# Patient Record
Sex: Female | Born: 1980
Health system: Southern US, Community
[De-identification: ages and names within clinical notes are randomized; demographics above are authoritative.]

## PROBLEM LIST (undated history)

## (undated) ENCOUNTER — Inpatient Hospital Stay (HOSPITAL_COMMUNITY): Payer: Self-pay

## (undated) DIAGNOSIS — I10 Essential (primary) hypertension: Secondary | ICD-10-CM

## (undated) DIAGNOSIS — G629 Polyneuropathy, unspecified: Secondary | ICD-10-CM

## (undated) DIAGNOSIS — R51 Headache: Secondary | ICD-10-CM

## (undated) DIAGNOSIS — N920 Excessive and frequent menstruation with regular cycle: Secondary | ICD-10-CM

## (undated) DIAGNOSIS — Z98891 History of uterine scar from previous surgery: Secondary | ICD-10-CM

## (undated) DIAGNOSIS — N390 Urinary tract infection, site not specified: Secondary | ICD-10-CM

## (undated) DIAGNOSIS — G5603 Carpal tunnel syndrome, bilateral upper limbs: Secondary | ICD-10-CM

## (undated) DIAGNOSIS — F419 Anxiety disorder, unspecified: Secondary | ICD-10-CM

## (undated) DIAGNOSIS — G43909 Migraine, unspecified, not intractable, without status migrainosus: Secondary | ICD-10-CM

## (undated) DIAGNOSIS — A749 Chlamydial infection, unspecified: Secondary | ICD-10-CM

## (undated) DIAGNOSIS — R569 Unspecified convulsions: Secondary | ICD-10-CM

## (undated) DIAGNOSIS — I2699 Other pulmonary embolism without acute cor pulmonale: Secondary | ICD-10-CM

## (undated) DIAGNOSIS — O139 Gestational [pregnancy-induced] hypertension without significant proteinuria, unspecified trimester: Secondary | ICD-10-CM

## (undated) DIAGNOSIS — B999 Unspecified infectious disease: Secondary | ICD-10-CM

## (undated) DIAGNOSIS — M1990 Unspecified osteoarthritis, unspecified site: Secondary | ICD-10-CM

## (undated) DIAGNOSIS — D649 Anemia, unspecified: Secondary | ICD-10-CM

## (undated) DIAGNOSIS — I119 Hypertensive heart disease without heart failure: Secondary | ICD-10-CM

## (undated) DIAGNOSIS — I517 Cardiomegaly: Secondary | ICD-10-CM

## (undated) DIAGNOSIS — G40309 Generalized idiopathic epilepsy and epileptic syndromes, not intractable, without status epilepticus: Secondary | ICD-10-CM

## (undated) HISTORY — DX: Urinary tract infection, site not specified: N39.0

## (undated) HISTORY — DX: Excessive and frequent menstruation with regular cycle: N92.0

## (undated) HISTORY — PX: MOUTH SURGERY: SHX715

## (undated) HISTORY — DX: Migraine, unspecified, not intractable, without status migrainosus: G43.909

## (undated) HISTORY — DX: Generalized idiopathic epilepsy and epileptic syndromes, not intractable, without status epilepticus: G40.309

## (undated) HISTORY — DX: Hypertensive heart disease without heart failure: I11.9

## (undated) HISTORY — PX: ABDOMINAL HYSTERECTOMY: SHX81

## (undated) HISTORY — DX: Cardiomegaly: I51.7

## (undated) HISTORY — DX: Unspecified osteoarthritis, unspecified site: M19.90

## (undated) HISTORY — DX: Other pulmonary embolism without acute cor pulmonale: I26.99

---

## 2002-02-23 ENCOUNTER — Emergency Department (HOSPITAL_COMMUNITY): Admission: EM | Admit: 2002-02-23 | Discharge: 2002-02-24 | Payer: Self-pay | Admitting: Emergency Medicine

## 2002-04-06 ENCOUNTER — Inpatient Hospital Stay (HOSPITAL_COMMUNITY): Admission: AD | Admit: 2002-04-06 | Discharge: 2002-04-09 | Payer: Self-pay | Admitting: *Deleted

## 2002-04-06 ENCOUNTER — Encounter: Payer: Self-pay | Admitting: *Deleted

## 2002-04-15 ENCOUNTER — Encounter: Admission: RE | Admit: 2002-04-15 | Discharge: 2002-04-15 | Payer: Self-pay | Admitting: *Deleted

## 2002-04-21 ENCOUNTER — Inpatient Hospital Stay (HOSPITAL_COMMUNITY): Admission: AD | Admit: 2002-04-21 | Discharge: 2002-04-21 | Payer: Self-pay | Admitting: Family Medicine

## 2002-04-24 ENCOUNTER — Inpatient Hospital Stay (HOSPITAL_COMMUNITY): Admission: AD | Admit: 2002-04-24 | Discharge: 2002-04-24 | Payer: Self-pay | Admitting: Obstetrics and Gynecology

## 2002-05-06 ENCOUNTER — Encounter: Admission: RE | Admit: 2002-05-06 | Discharge: 2002-05-06 | Payer: Self-pay | Admitting: *Deleted

## 2002-05-13 ENCOUNTER — Encounter: Admission: RE | Admit: 2002-05-13 | Discharge: 2002-05-13 | Payer: Self-pay | Admitting: Obstetrics and Gynecology

## 2002-05-27 ENCOUNTER — Encounter: Admission: RE | Admit: 2002-05-27 | Discharge: 2002-05-27 | Payer: Self-pay | Admitting: *Deleted

## 2002-06-10 ENCOUNTER — Encounter: Admission: RE | Admit: 2002-06-10 | Discharge: 2002-06-10 | Payer: Self-pay | Admitting: *Deleted

## 2002-06-11 ENCOUNTER — Inpatient Hospital Stay (HOSPITAL_COMMUNITY): Admission: RE | Admit: 2002-06-11 | Discharge: 2002-06-11 | Payer: Self-pay | Admitting: Family Medicine

## 2002-06-11 ENCOUNTER — Encounter: Payer: Self-pay | Admitting: Family Medicine

## 2002-06-12 ENCOUNTER — Inpatient Hospital Stay (HOSPITAL_COMMUNITY): Admission: AD | Admit: 2002-06-12 | Discharge: 2002-06-15 | Payer: Self-pay | Admitting: *Deleted

## 2002-06-23 ENCOUNTER — Encounter: Admission: RE | Admit: 2002-06-23 | Discharge: 2002-06-23 | Payer: Self-pay | Admitting: *Deleted

## 2002-07-08 ENCOUNTER — Encounter: Admission: RE | Admit: 2002-07-08 | Discharge: 2002-07-08 | Payer: Self-pay | Admitting: *Deleted

## 2002-07-15 ENCOUNTER — Encounter: Admission: RE | Admit: 2002-07-15 | Discharge: 2002-07-15 | Payer: Self-pay | Admitting: *Deleted

## 2002-07-21 ENCOUNTER — Encounter: Admission: RE | Admit: 2002-07-21 | Discharge: 2002-07-21 | Payer: Self-pay | Admitting: *Deleted

## 2002-07-29 ENCOUNTER — Encounter (HOSPITAL_COMMUNITY): Admission: RE | Admit: 2002-07-29 | Discharge: 2002-08-02 | Payer: Self-pay | Admitting: *Deleted

## 2002-07-29 ENCOUNTER — Encounter: Admission: RE | Admit: 2002-07-29 | Discharge: 2002-07-29 | Payer: Self-pay | Admitting: *Deleted

## 2002-08-05 ENCOUNTER — Inpatient Hospital Stay (HOSPITAL_COMMUNITY): Admission: AD | Admit: 2002-08-05 | Discharge: 2002-08-09 | Payer: Self-pay | Admitting: *Deleted

## 2002-08-05 ENCOUNTER — Encounter: Admission: RE | Admit: 2002-08-05 | Discharge: 2002-08-05 | Payer: Self-pay | Admitting: *Deleted

## 2002-08-15 ENCOUNTER — Inpatient Hospital Stay (HOSPITAL_COMMUNITY): Admission: AD | Admit: 2002-08-15 | Discharge: 2002-08-15 | Payer: Self-pay | Admitting: Family Medicine

## 2005-06-06 ENCOUNTER — Inpatient Hospital Stay (HOSPITAL_COMMUNITY): Admission: AD | Admit: 2005-06-06 | Discharge: 2005-06-06 | Payer: Self-pay | Admitting: Obstetrics & Gynecology

## 2005-06-10 ENCOUNTER — Inpatient Hospital Stay (HOSPITAL_COMMUNITY): Admission: AD | Admit: 2005-06-10 | Discharge: 2005-06-10 | Payer: Self-pay | Admitting: Obstetrics and Gynecology

## 2005-06-14 ENCOUNTER — Inpatient Hospital Stay (HOSPITAL_COMMUNITY): Admission: AD | Admit: 2005-06-14 | Discharge: 2005-06-14 | Payer: Self-pay | Admitting: *Deleted

## 2005-06-25 ENCOUNTER — Ambulatory Visit: Payer: Self-pay | Admitting: *Deleted

## 2005-07-18 ENCOUNTER — Ambulatory Visit: Payer: Self-pay | Admitting: Family Medicine

## 2005-07-18 ENCOUNTER — Inpatient Hospital Stay (HOSPITAL_COMMUNITY): Admission: RE | Admit: 2005-07-18 | Discharge: 2005-07-18 | Payer: Self-pay | Admitting: *Deleted

## 2005-08-08 ENCOUNTER — Ambulatory Visit: Payer: Self-pay | Admitting: Family Medicine

## 2005-08-29 ENCOUNTER — Ambulatory Visit: Payer: Self-pay | Admitting: Gynecology

## 2005-09-19 ENCOUNTER — Ambulatory Visit (HOSPITAL_COMMUNITY): Admission: RE | Admit: 2005-09-19 | Discharge: 2005-09-19 | Payer: Self-pay | Admitting: *Deleted

## 2005-09-19 ENCOUNTER — Ambulatory Visit: Payer: Self-pay | Admitting: *Deleted

## 2005-09-26 ENCOUNTER — Ambulatory Visit: Payer: Self-pay | Admitting: *Deleted

## 2005-09-27 ENCOUNTER — Inpatient Hospital Stay (HOSPITAL_COMMUNITY): Admission: AD | Admit: 2005-09-27 | Discharge: 2005-09-28 | Payer: Self-pay | Admitting: Obstetrics and Gynecology

## 2005-09-27 ENCOUNTER — Ambulatory Visit: Payer: Self-pay | Admitting: Obstetrics and Gynecology

## 2005-10-03 ENCOUNTER — Ambulatory Visit: Payer: Self-pay | Admitting: Gynecology

## 2005-10-10 ENCOUNTER — Ambulatory Visit: Payer: Self-pay | Admitting: Gynecology

## 2005-10-31 ENCOUNTER — Ambulatory Visit: Payer: Self-pay | Admitting: Gynecology

## 2005-11-14 ENCOUNTER — Ambulatory Visit: Payer: Self-pay | Admitting: Family Medicine

## 2005-11-28 ENCOUNTER — Ambulatory Visit: Payer: Self-pay | Admitting: Family Medicine

## 2005-12-02 ENCOUNTER — Inpatient Hospital Stay (HOSPITAL_COMMUNITY): Admission: AD | Admit: 2005-12-02 | Discharge: 2005-12-02 | Payer: Self-pay | Admitting: Gynecology

## 2005-12-02 ENCOUNTER — Ambulatory Visit: Payer: Self-pay | Admitting: Family Medicine

## 2005-12-09 ENCOUNTER — Inpatient Hospital Stay (HOSPITAL_COMMUNITY): Admission: AD | Admit: 2005-12-09 | Discharge: 2005-12-09 | Payer: Self-pay | Admitting: Obstetrics & Gynecology

## 2005-12-09 ENCOUNTER — Ambulatory Visit: Payer: Self-pay | Admitting: Family Medicine

## 2005-12-12 ENCOUNTER — Ambulatory Visit: Payer: Self-pay | Admitting: *Deleted

## 2005-12-19 ENCOUNTER — Ambulatory Visit: Payer: Self-pay | Admitting: Family Medicine

## 2006-01-02 ENCOUNTER — Ambulatory Visit: Payer: Self-pay | Admitting: Gynecology

## 2006-01-09 ENCOUNTER — Ambulatory Visit: Payer: Self-pay | Admitting: Gynecology

## 2006-01-20 ENCOUNTER — Inpatient Hospital Stay (HOSPITAL_COMMUNITY): Admission: AD | Admit: 2006-01-20 | Discharge: 2006-01-21 | Payer: Self-pay | Admitting: Gynecology

## 2006-01-20 ENCOUNTER — Ambulatory Visit: Payer: Self-pay | Admitting: Gynecology

## 2006-01-30 ENCOUNTER — Ambulatory Visit: Payer: Self-pay | Admitting: Family Medicine

## 2006-02-01 ENCOUNTER — Inpatient Hospital Stay (HOSPITAL_COMMUNITY): Admission: AD | Admit: 2006-02-01 | Discharge: 2006-02-01 | Payer: Self-pay | Admitting: Family Medicine

## 2006-02-02 ENCOUNTER — Ambulatory Visit: Payer: Self-pay | Admitting: Certified Nurse Midwife

## 2006-02-02 ENCOUNTER — Inpatient Hospital Stay (HOSPITAL_COMMUNITY): Admission: AD | Admit: 2006-02-02 | Discharge: 2006-02-02 | Payer: Self-pay | Admitting: Obstetrics and Gynecology

## 2006-02-04 ENCOUNTER — Inpatient Hospital Stay (HOSPITAL_COMMUNITY): Admission: AD | Admit: 2006-02-04 | Discharge: 2006-02-05 | Payer: Self-pay | Admitting: Family Medicine

## 2006-02-04 ENCOUNTER — Ambulatory Visit: Payer: Self-pay | Admitting: *Deleted

## 2006-02-06 ENCOUNTER — Ambulatory Visit: Payer: Self-pay | Admitting: Gynecology

## 2006-02-09 ENCOUNTER — Ambulatory Visit: Payer: Self-pay | Admitting: Certified Nurse Midwife

## 2006-02-09 ENCOUNTER — Inpatient Hospital Stay (HOSPITAL_COMMUNITY): Admission: AD | Admit: 2006-02-09 | Discharge: 2006-02-09 | Payer: Self-pay | Admitting: Obstetrics & Gynecology

## 2006-02-10 ENCOUNTER — Ambulatory Visit: Payer: Self-pay | Admitting: Obstetrics & Gynecology

## 2006-02-10 ENCOUNTER — Ambulatory Visit (HOSPITAL_COMMUNITY): Admission: RE | Admit: 2006-02-10 | Discharge: 2006-02-10 | Payer: Self-pay | Admitting: Obstetrics & Gynecology

## 2006-02-11 ENCOUNTER — Inpatient Hospital Stay (HOSPITAL_COMMUNITY): Admission: AD | Admit: 2006-02-11 | Discharge: 2006-02-13 | Payer: Self-pay | Admitting: Gynecology

## 2006-02-11 ENCOUNTER — Ambulatory Visit: Payer: Self-pay | Admitting: Family Medicine

## 2006-08-15 ENCOUNTER — Emergency Department (HOSPITAL_COMMUNITY): Admission: EM | Admit: 2006-08-15 | Discharge: 2006-08-15 | Payer: Self-pay | Admitting: Emergency Medicine

## 2009-07-28 DIAGNOSIS — G40909 Epilepsy, unspecified, not intractable, without status epilepticus: Secondary | ICD-10-CM | POA: Insufficient documentation

## 2009-10-06 DIAGNOSIS — B009 Herpesviral infection, unspecified: Secondary | ICD-10-CM | POA: Insufficient documentation

## 2009-10-06 DIAGNOSIS — A6000 Herpesviral infection of urogenital system, unspecified: Secondary | ICD-10-CM | POA: Insufficient documentation

## 2009-10-09 DIAGNOSIS — D508 Other iron deficiency anemias: Secondary | ICD-10-CM | POA: Insufficient documentation

## 2010-03-08 ENCOUNTER — Emergency Department (HOSPITAL_COMMUNITY): Admission: EM | Admit: 2010-03-08 | Discharge: 2010-03-08 | Payer: Self-pay | Admitting: Emergency Medicine

## 2010-07-15 ENCOUNTER — Encounter: Payer: Self-pay | Admitting: *Deleted

## 2010-09-06 LAB — POCT I-STAT, CHEM 8
Creatinine, Ser: 0.8 mg/dL (ref 0.4–1.2)
Hemoglobin: 12.6 g/dL (ref 12.0–15.0)
Sodium: 141 mEq/L (ref 135–145)
TCO2: 24 mmol/L (ref 0–100)

## 2010-09-06 LAB — POCT CARDIAC MARKERS: Myoglobin, poc: 54.6 ng/mL (ref 12–200)

## 2010-09-06 LAB — D-DIMER, QUANTITATIVE: D-Dimer, Quant: <0.22 ug/mL-FEU (ref 0.00–0.48)

## 2010-09-24 DIAGNOSIS — S43499A Other sprain of unspecified shoulder joint, initial encounter: Secondary | ICD-10-CM | POA: Insufficient documentation

## 2010-09-24 DIAGNOSIS — J069 Acute upper respiratory infection, unspecified: Secondary | ICD-10-CM | POA: Insufficient documentation

## 2010-11-09 NOTE — Op Note (Signed)
NAME:  Mary Roberts, Mary Roberts                        ACCOUNT NO.:  [id number]   MEDICAL RECORD NO.:  [id number]                   PATIENT TYPE:  INP   LOCATION:  9176                                 FACILITY:  [facility]   PHYSICIAN:  Mary Sella. Orlene Erm, M.D.                 DATE OF BIRTH:  1981/01/11   DATE OF PROCEDURE:  08/06/2002  DATE OF DISCHARGE:                                 OPERATIVE REPORT   PREOPERATIVE DIAGNOSIS:  The patient is a 30 year old G1 with arrest of  dilatation.   POSTOPERATIVE DIAGNOSIS:  The patient is a 30 year old G1 with arrest of  dilatation.   PROCEDURE:  Primary low transverse cesarean section.   SURGEON:  Mary Sella. Orlene Erm, M.D.   ANESTHESIA:  Epidural.   COMPLICATIONS:  None.   ESTIMATED BLOOD LOSS:  1000 mL.   SPECIMENS:  Cord blood.   FINDINGS:  A viable female infant delivered at 5:50 a.m., with Apgars of 8 and  9, weighing 7 pounds 10 ounces.   DISPOSITION:  Recovery room stable.   INDICATION FOR PROCEDURE:  The patient is a 30 year old gravida 1 at 39  weeks 6 days, who progressed in labor to complete, complete, and 0 station.  She began pushing and over approximately 2-1/2 hours had no descent of the  fetal head past +1 station.  With fundal pressure and pushing, there was no  further descent of the fetus.  The decision was made to perform a primary  low transverse C-section secondary to arrest of descent.  The patient was  consented with the risk of bleeding, infection, and injury to the internal  organs, risk of transfusion of emergent hysterectomy.  The patient  understands the risks and wishes to proceed.   DESCRIPTION OF PROCEDURE:  The patient was taken to the operating room,  where her epidural anesthesia was bolused.  She was prepped and draped in  sterile fashion.  A Pfannenstiel incision was performed with a scalpel and  carried down to the underlying fascia.  The fascia was entered sharply and  dissected laterally with Mayo scissors.  The  fascia was separated from the  underlying rectus muscle utilizing sharp and blunt dissection.  The muscle  layers were separated and the underlying peritoneum was entered bluntly with  the surgeon's finger.  The peritoneum was dissected both superiorly and  inferiorly.  The bladder blade was placed, a bladder flap was created with  sharp and blunt dissection.  The uterus was scored with a scalpel and the  incision was extended in the midline to the uterine cavity.  The surgeon's  fingers were easily able to extend the incision laterally and superiorly.  The infant's head was delivered through the uterine incision and bulb-  suctioned on the operative field.  The infant's body was delivered and the  cord was clamped and cut.  The infant was handed off to the awaiting  neonatal resuscitation team.  The uterus was massaged and the placenta was  extracted.  The uterus was externalized and curetted with a dry lap sponge.  A running locking suture of 0 chromic was used to close the uterine  incision.  A second imbricating layer was used as well.  The uterus was  returned to the abdominal cavity and the paracolic gutters were emptied of  clot and debris.  The uterine incision was inspected and noted to be  hemostatic.  The fascia was then closed in a running suture of 0 Vicryl.  The subcutaneous tissues were irrigated with saline.  There was some oozing  from the fascial edge, and a single suture of 0 chromic was used to obtain  hemostasis.  There was some irregularity to the skin incision secondary to  the bunching of skin with the adhesive drape.  The decision was made to  excise approximately 1-2 mm of skin along the superior and inferior portions  of the incision to make the skin edges reapproximate more closely.  This was  done with a scalpel and Bovie.  The skin edges were then reapproximated with  the staples.  The patient tolerated the procedure well and returned to the  recovery room in  stable condition.  Sponge, instrument, and needle counts  were correct at the end of the procedure.                                               Mary Sella. Orlene Erm, M.D.    EMH/MEDQ  D:  08/06/2002  T:  08/06/2002  Job:  [id number]

## 2010-11-09 NOTE — Discharge Summary (Signed)
   NAME:  Mary Roberts, Mary Roberts                        ACCOUNT NO.:  [id number]   MEDICAL RECORD NO.:  [id number]                   PATIENT TYPE:  INP   LOCATION:  9146                                 [facility]ILITY:  [facility]   PHYSICIAN:  Nani Gasser, M.D.            DATE OF BIRTH:  05-28-1981   DATE OF ADMISSION:  08/05/2002  DATE OF DISCHARGE:  08/09/2002                                 DISCHARGE SUMMARY   DISCHARGE DIAGNOSES:  1. Term delivery.  2. Low transverse cesarean section.   DISCHARGE MEDICATIONS:  1. Percocet 5/325 mg one q.4h. p.r.n.  2. Ibuprofen 600 mg one p.o. q.6h. p.r.n.  3. Mylanta gas chewables over-the-counter as directed.   Wound care, diet, activity are per instruction booklet.   FOLLOWUP:  The patient is to follow up at [facility] in six weeks.   DISCHARGE DISPOSITION:  The patient is discharged home.   HOSPITAL COURSE:  The patient is a 30 year old G1, P0-0-0-0, at 39-5/7 weeks  by last menstrual period.  The patient initially was admitted after  complaining of blood in her urine x4.  She was also feeling some mild  contractions and fetal activity at that time.  Denied fever or back  discomfort, headaches, vision changes, shortness of breath, or abdominal  pain.  The patient was getting her care at [facility].  It was late  prenatal care, and the patient did have a history of pyelonephritis earlier  in her pregnancy.  The patient was also noted to have elevated blood  pressures of 154/84 to 145/90, and pregnancy induced hypertension labs were  drawn which were negative with an uric acid of 4.9, LDH of 148, AST 21, ALT  15, BUN 8, and creatinine 0.6.  She was also dilated 4 cm in active labor at  that point.  The patient started pushing with contractions and did position  changes.  Because of arrest of descent, the patient was taken to the  operating room for a low transverse cesarean section.  The patient had an  epidural, and had an infant boy with  Apgar's of 8 at one minute and 9 at  five minutes, weighing 7 pounds 10 ounces.  The patient was kept until  postoperative day #3.  The patient recovered without difficulties.  She is  currently bottle feeding, and does not want any form of birth control at  this time, and did not want to discuss options.                                               Nani Gasser, M.D.    CM/MEDQ  D:  08/09/2002  T:  08/09/2002  Job:  [id number]

## 2010-11-09 NOTE — Discharge Summary (Signed)
NAME:  Mary Roberts, Mary Roberts                        ACCOUNT NO.:  [id number]   MEDICAL RECORD NO.:  [id number]                   PATIENT TYPE:  INP   LOCATION:  9156                                 [facility]ILITY[facility]   PHYSICIAN:  Nilda Simmer, M.D.                  DATE OF BIRTH:  02/20/1981   DATE OF ADMISSION:  06/12/2002  DATE OF DISCHARGE:  06/15/2002                                 DISCHARGE SUMMARY   DATE OF BIRTH:  January 08, 1981.   PROCEDURE:  None.   CONSULTATIONS:  None.   DISCHARGE DIAGNOSES:  1. Intrauterine pregnancy at 9 and 4/7th weeks gestation.  2. Preterm labor.   DISCHARGE MEDICATIONS:  1. Terbutaline 2.5 mg one by mouth every four hours.  2. Iron sulfate 325 mg one by mouth each day as previously prescribed.  3. Prenatal vitamins one by mouth each day.   FOLLOW UP:  The patient has a follow-up appointment at the [facility]  [facility] on June 23, 2002 as previously scheduled.   HOSPITAL COURSE:  Mary Roberts is a 30 year old Gravida I, Para 0,  presenting originally at 31 and 1/7th weeks gestation, who is followed at  the [facility] secondary to late prenatal care, pyelonephritis this  pregnancy, who presented secondary to appreciation of a dynamic cervix on  ultrasound on June 11, 2002. The patient was sent to Maternity Admission  Unit for evaluation during preterm labor. Upon evaluation, the patient's  cervix was found to be 150% and high. The patient was found to be  contracting irregularly on Toco monitoring. Cultures were obtained initially  on evaluation. Urine culture was negative. Wet prep negative. Gonorrhea and  Chlamydia cultures negative. GBS culture negative. However, the patient was  fetal fibronectin positive. The patient was admitted and received 72 hours  of Unasyn therapy as well as two doses of beta Methasone therapy during  hospitalization. The patient was placed on strict bedrest and IV fluids. The  patient  was also placed on oral tocolytics of Terbutaline which stopped  further uterine contractions. On hospital day three, the patient's cervix  was evaluated and was found to be one long and high. The patient was  counseled extensively in regards to the importance of strict bedrest and  detecting uterine contractions. The patient was instructed to return to the  hospital if she were to start contracting. The patient expressed  understanding of this and was discharged on hospital day three. The patient  will follow-up in one week at the [facility].   LABORATORY DATA:  Wed prep negative for yeast, clue cells, or trichomonas.  Gonorrhea negative. Chlamydia negative. GBS negative. Fetal fibronectin  positive. Urine culture negative. Urinalysis negative. CBC revealed white  blood cells of 11, hemoglobin 10.3, hematocrit 29.8 and platelets of 204.  Ultrasound at 32 weeks revealed a normal AFI with cervix that was  dynamic,  ranging from 1.7 to 4.4 in length and finally appropriate growth of 50 to 75  percentile for [redacted] weeks gestation.   ACTIVITY:  Strict bedrest. Only up to eat and bathe. No sexual activity or  anything that may induce an organism.   WOUND CARE:  Not applicable.   DIET:  Regular.   SPECIAL INSTRUCTIONS:  The patient was instructed to contact the [facility] or present to Maternity Admissions at [facility] for  development of contractions, leaking of fluid, vaginal bleeding, or  decreased fetal movement. The patient was instructed by myself and by  nursing in regards to preterm labor precautions.                                               Nilda Simmer, M.D.    KS/MEDQ  D:  06/15/2002  T:  06/15/2002  Job:  [id number]   cc:   Conni Elliot, M.D.  [address]  [place]  [place] [address]  Fax: [phone]   [facility]  [facility]

## 2010-11-09 NOTE — Discharge Summary (Signed)
   NAME:  Mary Roberts, Mary Roberts                        ACCOUNT NO.:  [id number]   MEDICAL RECORD NO.:  [id number]                   PATIENT TYPE:  INP   LOCATION:  9146                                 [facility]   PHYSICIAN:  Conni Elliot, M.D.             DATE OF BIRTH:  [birth year]   DATE OF ADMISSION:  04/06/2002  DATE OF DISCHARGE:  04/09/2002                                 DISCHARGE SUMMARY   DISCHARGE DIAGNOSES:  1. A 22 week intrauterine pregnancy.  2. Pyelonephritis.  3. Anemia.  4. Chlamydia.   DISCHARGE MEDICATIONS:  1. Bactrim one p.o. b.i.d. x7 days.  2. Macrobid one p.o. q.d. until delivery.  3. Prenatal vitamins p.o. q.d.   HOSPITAL COURSE:  The patient is a 30 year old G1, P0, P0, A0, L0 dated by  LMP which was Oct 30, 2001.  The patient came in complaining of nausea x2  days, but no vomiting.  The patient said that she took a urine home  pregnancy test the week before which was negative and felt that she may have  been feeling some fetal movement.  Here at urine pregnancy test was  positive.  The patient also had an ultrasound at 23 weeks showing positive  cardiac activity.  A wet prep showed few clue cells, few white blood cells,  and moderate bacteria.  CBC with a clean catch urine with a small amount of  hemoglobin, nitrite positive, and a large amount of leukocyte esterase.  The  micro on the urine showed white blood cells too numerous to count.  Thus,  patient was admitted for the following problem.  Problem 1 - PYELONEPHRITIS:  The patient was started on IV Cefotetan and  switched to p.o. Bactrim at discharge.  The patient was also started on  Macrobid which she is to start taking after finishing the Bactrim for UTI  suppression until delivery of the infant.  Problem 2 - INTRAUTERINE PREGNANCY AT 23 WEEKS:  The patient was seen by  social work and in addition patient was set to follow up at [facility].  Problem 3 - ANEMIA:  The patient was  started on iron and given a stool  softener regimen as well.  The patient was also started on daily prenatal  vitamins.  Problem 4 - CHLAMYDIA:  The patient was treated with 1 g of azithromycin and  was not discharged home on any further medications for that.     Nani Gasser, M.D.                  Conni Elliot, M.D.    CM/MEDQ  D:  06/01/2002  T:  06/01/2002  Job:  [id number]

## 2011-01-05 ENCOUNTER — Emergency Department (HOSPITAL_COMMUNITY)
Admission: EM | Admit: 2011-01-05 | Discharge: 2011-01-05 | Disposition: A | Discharge status: Home / Self Care | Payer: Self-pay | Attending: Emergency Medicine | Admitting: Emergency Medicine

## 2011-01-05 DIAGNOSIS — W268XXA Contact with other sharp object(s), not elsewhere classified, initial encounter: Secondary | ICD-10-CM | POA: Insufficient documentation

## 2011-01-05 DIAGNOSIS — Z23 Encounter for immunization: Secondary | ICD-10-CM | POA: Insufficient documentation

## 2011-01-05 DIAGNOSIS — S61209A Unspecified open wound of unspecified finger without damage to nail, initial encounter: Secondary | ICD-10-CM | POA: Insufficient documentation

## 2011-01-11 ENCOUNTER — Emergency Department (HOSPITAL_COMMUNITY)
Admission: EM | Admit: 2011-01-11 | Discharge: 2011-01-11 | Disposition: A | Discharge status: Home / Self Care | Payer: Self-pay | Attending: Emergency Medicine | Admitting: Emergency Medicine

## 2011-01-11 DIAGNOSIS — T8131XA Disruption of external operation (surgical) wound, not elsewhere classified, initial encounter: Secondary | ICD-10-CM | POA: Insufficient documentation

## 2011-01-11 DIAGNOSIS — X58XXXA Exposure to other specified factors, initial encounter: Secondary | ICD-10-CM | POA: Insufficient documentation

## 2011-01-11 DIAGNOSIS — Y838 Other surgical procedures as the cause of abnormal reaction of the patient, or of later complication, without mention of misadventure at the time of the procedure: Secondary | ICD-10-CM | POA: Insufficient documentation

## 2011-01-11 DIAGNOSIS — S61209A Unspecified open wound of unspecified finger without damage to nail, initial encounter: Secondary | ICD-10-CM | POA: Insufficient documentation

## 2011-05-10 DIAGNOSIS — Z3042 Encounter for surveillance of injectable contraceptive: Secondary | ICD-10-CM | POA: Insufficient documentation

## 2011-05-10 DIAGNOSIS — IMO0001 Reserved for inherently not codable concepts without codable children: Secondary | ICD-10-CM | POA: Insufficient documentation

## 2012-12-18 ENCOUNTER — Emergency Department (HOSPITAL_COMMUNITY)
Admission: EM | Admit: 2012-12-18 | Discharge: 2012-12-19 | Disposition: A | Discharge status: Home / Self Care | Payer: Self-pay | Attending: Emergency Medicine | Admitting: Emergency Medicine

## 2012-12-18 ENCOUNTER — Encounter (HOSPITAL_COMMUNITY): Payer: Self-pay

## 2012-12-18 DIAGNOSIS — H11429 Conjunctival edema, unspecified eye: Secondary | ICD-10-CM | POA: Insufficient documentation

## 2012-12-18 DIAGNOSIS — H00013 Hordeolum externum right eye, unspecified eyelid: Secondary | ICD-10-CM

## 2012-12-18 DIAGNOSIS — H11421 Conjunctival edema, right eye: Secondary | ICD-10-CM

## 2012-12-18 DIAGNOSIS — H00019 Hordeolum externum unspecified eye, unspecified eyelid: Secondary | ICD-10-CM | POA: Insufficient documentation

## 2012-12-18 DIAGNOSIS — F17200 Nicotine dependence, unspecified, uncomplicated: Secondary | ICD-10-CM | POA: Insufficient documentation

## 2012-12-18 NOTE — ED Notes (Signed)
Pt presents with swelling and pain in her right eye that started two days ago. Pt says she saw a bubble in her eye earlier, but does not see it now. Pt's eye is red.

## 2012-12-19 MED ORDER — FLUORESCEIN SODIUM 1 MG OP STRP
1.0000 | ORAL_STRIP | Freq: Once | OPHTHALMIC | Status: AC
  Administered 2012-12-19: 1 via OPHTHALMIC
  Filled 2012-12-19: qty 1

## 2012-12-19 MED ORDER — TOBRAMYCIN 0.3 % OP SOLN: 1.0000 [drp] | OPHTHALMIC | Status: DC

## 2012-12-19 MED ORDER — TETRACAINE HCL 0.5 % OP SOLN
2.0000 [drp] | Freq: Once | OPHTHALMIC | Status: AC
  Administered 2012-12-19: 2 [drp] via OPHTHALMIC
  Filled 2012-12-19: qty 2

## 2012-12-19 NOTE — ED Provider Notes (Signed)
Medical screening examination/treatment/procedure(s) were conducted as a shared visit with non-physician practitioner(s) and myself.  I personally evaluated the patient during the encounter  This appears to be localized ecchymosis of the lateral aspect of her right eye.  Extraocular movements are intact.  Pupils normal appearing.  No evidence of hordeolum or chalazion.  Home with symptomatic treatment for a followup.  No change in her vision.  Lyanne Co, MD 12/19/12 [phone]

## 2012-12-19 NOTE — ED Provider Notes (Signed)
History    CSN: [id number] Arrival date & time 12/18/12  2219  First MD Initiated Contact with Patient 12/19/12 0011     Chief Complaint  Patient presents with  . Eye Pain   (Consider location/radiation/quality/duration/timing/severity/associated sxs/prior Treatment) Patient is a 32 y.o. female presenting with eye pain. The history is provided by the patient and medical records. No language interpreter was used.  Eye Pain Pertinent negatives include no abdominal pain, chest pain, coughing, diaphoresis, fatigue, fever, headaches, nausea, rash or vomiting.    Mary Roberts is a 32 y.o. female  with no known medical hx presents to the Emergency Department complaining of gradual, persistent, progressively worsening  Erythema of the right thigh leg beginning 2 days ago. She states she saw a "bubble" on her I really did not burst. She states she does not see it anymore.. Associated symptoms include swelling and erythema of the right eye, irritation and itching of the right eye.  Nothing makes it better or worse. Patient denies fever, chills, changes in vision, headache, neck pain, chest pain, shortness of breath, nausea, vomiting, diarrhea, injury to the eye, discharge from the eye.  History reviewed. No pertinent past medical history. Past Surgical History  Procedure Laterality Date  . Cesarean section     No family history on file. History  Substance Use Topics  . Smoking status: Current Some Day Smoker  . Smokeless tobacco: Not on file  . Alcohol Use: Yes     Comment: socially    OB History   Grav Para Term Preterm Abortions TAB SAB Ect Mult Living                 Review of Systems  Constitutional: Negative for fever, diaphoresis, appetite change, fatigue and unexpected weight change.  HENT: Negative for mouth sores and neck stiffness.   Eyes: Positive for pain and redness. Negative for visual disturbance.  Respiratory: Negative for cough, chest tightness, shortness of  breath and wheezing.   Cardiovascular: Negative for chest pain.  Gastrointestinal: Negative for nausea, vomiting, abdominal pain, diarrhea and constipation.  Endocrine: Negative for polydipsia, polyphagia and polyuria.  Genitourinary: Negative for dysuria, urgency, frequency and hematuria.  Musculoskeletal: Negative for back pain.  Skin: Negative for rash.  Allergic/Immunologic: Negative for immunocompromised state.  Neurological: Negative for syncope, light-headedness and headaches.  Hematological: Does not bruise/bleed easily.  Psychiatric/Behavioral: Negative for sleep disturbance. The patient is not nervous/anxious.     Allergies  Review of patient's allergies indicates no known allergies.  Home Medications   Current Outpatient Rx  Name  Route  Sig  Dispense  Refill  . aspirin-acetaminophen-caffeine (EXCEDRIN EXTRA STRENGTH) 250-250-65 MG per tablet   Oral   Take 2 tablets by mouth every 6 (six) hours as needed for pain (headache).         . polyvinyl alcohol (LIQUIFILM TEARS) 1.4 % ophthalmic solution   Both Eyes   Place 1 drop into both eyes as needed (dryness and redness of eyes).         Marland Kitchen tobramycin (TOBREX) 0.3 % ophthalmic solution   Right Eye   Place 1 drop into the right eye every 4 (four) hours.   5 mL   0    BP 167/73  Pulse 79  Temp(Src) 98.6 F (37 C) (Oral)  Resp 20  SpO2 99%  LMP 10/27/2012 Physical Exam  Nursing note and vitals reviewed. Constitutional: She is oriented to person, place, and time. She appears well-developed and well-nourished. No  distress.  HENT:  Head: Normocephalic and atraumatic.  Mouth/Throat: Oropharynx is clear and moist. No oropharyngeal exudate.  Eyes: Conjunctivae, EOM and lids are normal. Pupils are equal, round, and reactive to light. No foreign bodies found. Right eye exhibits chemosis and hordeolum. Right eye exhibits no discharge and no exudate. No foreign body present in the right eye. Left eye exhibits no  chemosis, no discharge, no exudate and no hordeolum. No foreign body present in the left eye. No scleral icterus.  Slit lamp exam:      The right eye shows no corneal abrasion, no corneal flare, no corneal ulcer, no foreign body, no hyphema and no fluorescein uptake.       The left eye shows no corneal abrasion, no corneal flare, no corneal ulcer, no foreign body, no hyphema and no fluorescein uptake.  Neck: Normal range of motion. Neck supple. No spinous process tenderness and no muscular tenderness present. No rigidity. Normal range of motion present.  Cardiovascular: Normal rate, regular rhythm, normal heart sounds and intact distal pulses.   No murmur heard. Pulmonary/Chest: Effort normal and breath sounds normal. No respiratory distress. She has no wheezes. She has no rales.  Abdominal: Soft. Bowel sounds are normal. She exhibits no distension and no mass. There is no tenderness. There is no rebound and no guarding.  Musculoskeletal: Normal range of motion. She exhibits no edema.  Lymphadenopathy:    She has no cervical adenopathy.  Neurological: She is alert and oriented to person, place, and time. No cranial nerve deficit. She exhibits normal muscle tone. Coordination normal.  Speech is clear and goal oriented Moves extremities without ataxia  Skin: Skin is warm and dry. She is not diaphoretic. There is erythema.  Right eyelid  Psychiatric: She has a normal mood and affect.    ED Course  Procedures (including critical care time) Labs Reviewed - No data to display No results found. 1. Chemosis of conjunctiva, right   2. Hordeolum eyelid, right     MDM  Mary Roberts presents with eye swelling and pain. Chemosis and likely developing hordeolum of the right eye.  No evidence of FB.  No change in vision, acuity equal bilaterally.  Pt is not a contact lens wearer.  Exam non-concerning for orbital cellulitis, hyphema, corneal ulcers or corneal abrasion. Patient will be discharged  home with tobramycin.   Patient understands to follow up with ophthalmology, & to return to ER if new symptoms develop including change in vision, purulent drainage, or entrapment.  I have also discussed reasons to return immediately to the ER.  Patient expresses understanding and agrees with plan.       Dahlia Client Marlissa Emerick, PA-C 12/19/12 [phone]

## 2013-02-18 ENCOUNTER — Encounter (HOSPITAL_COMMUNITY): Payer: Self-pay | Admitting: Emergency Medicine

## 2013-02-18 ENCOUNTER — Emergency Department (HOSPITAL_COMMUNITY)
Admission: EM | Admit: 2013-02-18 | Discharge: 2013-02-18 | Disposition: A | Discharge status: Home / Self Care | Payer: Self-pay | Attending: Emergency Medicine | Admitting: Emergency Medicine

## 2013-02-18 DIAGNOSIS — Z79899 Other long term (current) drug therapy: Secondary | ICD-10-CM | POA: Insufficient documentation

## 2013-02-18 DIAGNOSIS — F17200 Nicotine dependence, unspecified, uncomplicated: Secondary | ICD-10-CM | POA: Insufficient documentation

## 2013-02-18 DIAGNOSIS — H6690 Otitis media, unspecified, unspecified ear: Secondary | ICD-10-CM | POA: Insufficient documentation

## 2013-02-18 DIAGNOSIS — H6692 Otitis media, unspecified, left ear: Secondary | ICD-10-CM

## 2013-02-18 DIAGNOSIS — Z792 Long term (current) use of antibiotics: Secondary | ICD-10-CM | POA: Insufficient documentation

## 2013-02-18 MED ORDER — ANTIPYRINE-BENZOCAINE 5.4-1.4 % OT SOLN
3.0000 [drp] | Freq: Once | OTIC | Status: AC
  Administered 2013-02-18: 3 [drp] via OTIC
  Filled 2013-02-18: qty 10

## 2013-02-18 MED ORDER — AMOXICILLIN 500 MG PO CAPS: 500.0000 mg | ORAL_CAPSULE | Freq: Three times a day (TID) | ORAL | Status: DC

## 2013-02-18 NOTE — ED Notes (Signed)
Pt c/o of ear pain. States that she thinks that there is a bug in ear. States that she can hear crackling in it. NAD at this time.

## 2013-02-18 NOTE — ED Provider Notes (Signed)
CSN: [id number]     Arrival date & time 02/18/13  1813 History  This chart was scribed for Marlon Pel, PA working with Gilda Crease, MD by Quintella Reichert, ED Scribe. This patient was seen in room WTR5/WTR5 and the patient's care was started at 6:18 PM.    Chief Complaint  Patient presents with  . Ear Pain    The history is provided by the patient. No language interpreter was used.    HPI Comments: Mary Roberts is a 32 y.o. female who presents to the Emergency Department complaining of several hours of moderate left ear pain.  Pt states that she was fine last night but this morning woke up with an "ocean sound" in the ear.  She put peroxide in the ear and also took Excedrin.  Several hours ago she gradually developed progressively-worsening pain to that ear.  Pain radiates to her left jaw.  She also states she hears a "clicking sound like when your jaw pops" in that ear and is concerned there may be a foreign body in the ear.  Pt denies prior h/o ear infection.    History reviewed. No pertinent past medical history.   Past Surgical History  Procedure Laterality Date  . Cesarean section      No family history on file.   History  Substance Use Topics  . Smoking status: Current Some Day Smoker  . Smokeless tobacco: Not on file  . Alcohol Use: Yes     Comment: socially     OB History   Grav Para Term Preterm Abortions TAB SAB Ect Mult Living                   Review of Systems  HENT: Positive for ear pain.        Clicking sound in ear  All other systems reviewed and are negative.     Allergies  Review of patient's allergies indicates no known allergies.  Home Medications   Current Outpatient Rx  Name  Route  Sig  Dispense  Refill  . amoxicillin (AMOXIL) 500 MG capsule   Oral   Take 1 capsule (500 mg total) by mouth 3 (three) times daily.   21 capsule   0   . aspirin-acetaminophen-caffeine (EXCEDRIN EXTRA STRENGTH) 250-250-65 MG per  tablet   Oral   Take 2 tablets by mouth every 6 (six) hours as needed for pain (headache).         . polyvinyl alcohol (LIQUIFILM TEARS) 1.4 % ophthalmic solution   Both Eyes   Place 1 drop into both eyes as needed (dryness and redness of eyes).         Marland Kitchen tobramycin (TOBREX) 0.3 % ophthalmic solution   Right Eye   Place 1 drop into the right eye every 4 (four) hours.   5 mL   0    BP 207/131  Pulse 88  Temp(Src) 98.9 F (37.2 C) (Oral)  Resp 20  SpO2 100%  Physical Exam  Nursing note and vitals reviewed. Constitutional: She is oriented to person, place, and time. She appears well-developed and well-nourished. No distress.  HENT:  Head: Normocephalic and atraumatic.  Right Ear: Tympanic membrane and ear canal normal.  Left Ear: Ear canal normal. There is swelling and tenderness. Tympanic membrane is erythematous.  Eyes: EOM are normal.  Neck: Neck supple. No tracheal deviation present.  Cardiovascular: Normal rate.   Pulmonary/Chest: Effort normal. No respiratory distress.  Musculoskeletal: Normal range of motion.  Neurological: She is alert and oriented to person, place, and time.  Skin: Skin is warm and dry.  Psychiatric: She has a normal mood and affect. Her behavior is normal.    ED Course  Procedures (including critical care time)  DIAGNOSTIC STUDIES: Oxygen Saturation is 100% on room air, normal by my interpretation.    COORDINATION OF CARE: 6:26 PM-Reassured pt that there does not appear to be an insect in her ear.  Discussed treatment plan which includes ear drops and antibiotics with pt at bedside and pt agreed to plan.    Labs Review Labs Reviewed - No data to display  Imaging Review No results found.  MDM   1. Otitis media, left    Referral to ENT given.  abx Amoxicillin for her otitis media and Auralgan for pain.  32 y.o.Mary Roberts's evaluation in the Emergency Department is complete. It has been determined that no acute conditions  requiring further emergency intervention are present at this time. The patient/guardian have been advised of the diagnosis and plan. We have discussed signs and symptoms that warrant return to the ED, such as changes or worsening in symptoms.  Vital signs are stable at discharge. Filed Vitals:   02/18/13 1826  BP: 207/131  Pulse: 88  Temp: 98.9 F (37.2 C)  Resp: 20    Patient/guardian has voiced understanding and agreed to follow-up with the PCP or specialist.  I personally performed the services described in this documentation, which was scribed in my presence. The recorded information has been reviewed and is accurate.   Dorthula Matas, PA-C 02/18/13 1841

## 2013-02-19 NOTE — ED Provider Notes (Signed)
Medical screening examination/treatment/procedure(s) were performed by non-physician practitioner and as supervising physician I was immediately available for consultation/collaboration.    Christopher J. Pollina, MD 02/19/13 2100 

## 2013-03-07 ENCOUNTER — Encounter (HOSPITAL_COMMUNITY): Payer: Self-pay

## 2013-03-07 ENCOUNTER — Emergency Department (HOSPITAL_COMMUNITY)
Admission: EM | Admit: 2013-03-07 | Discharge: 2013-03-07 | Disposition: A | Discharge status: Home / Self Care | Payer: Medicaid Other | Attending: Emergency Medicine | Admitting: Emergency Medicine

## 2013-03-07 DIAGNOSIS — B373 Candidiasis of vulva and vagina: Secondary | ICD-10-CM | POA: Insufficient documentation

## 2013-03-07 DIAGNOSIS — R059 Cough, unspecified: Secondary | ICD-10-CM | POA: Insufficient documentation

## 2013-03-07 DIAGNOSIS — B379 Candidiasis, unspecified: Secondary | ICD-10-CM

## 2013-03-07 DIAGNOSIS — L299 Pruritus, unspecified: Secondary | ICD-10-CM | POA: Insufficient documentation

## 2013-03-07 DIAGNOSIS — T360X5A Adverse effect of penicillins, initial encounter: Secondary | ICD-10-CM | POA: Insufficient documentation

## 2013-03-07 DIAGNOSIS — N898 Other specified noninflammatory disorders of vagina: Secondary | ICD-10-CM | POA: Insufficient documentation

## 2013-03-07 DIAGNOSIS — R21 Rash and other nonspecific skin eruption: Secondary | ICD-10-CM | POA: Insufficient documentation

## 2013-03-07 DIAGNOSIS — R05 Cough: Secondary | ICD-10-CM | POA: Insufficient documentation

## 2013-03-07 DIAGNOSIS — Z87891 Personal history of nicotine dependence: Secondary | ICD-10-CM | POA: Insufficient documentation

## 2013-03-07 DIAGNOSIS — Z79899 Other long term (current) drug therapy: Secondary | ICD-10-CM | POA: Insufficient documentation

## 2013-03-07 DIAGNOSIS — R55 Syncope and collapse: Secondary | ICD-10-CM | POA: Insufficient documentation

## 2013-03-07 DIAGNOSIS — T7840XA Allergy, unspecified, initial encounter: Secondary | ICD-10-CM

## 2013-03-07 DIAGNOSIS — IMO0002 Reserved for concepts with insufficient information to code with codable children: Secondary | ICD-10-CM | POA: Insufficient documentation

## 2013-03-07 DIAGNOSIS — B3731 Acute candidiasis of vulva and vagina: Secondary | ICD-10-CM | POA: Insufficient documentation

## 2013-03-07 HISTORY — DX: Unspecified convulsions: R56.9

## 2013-03-07 LAB — WET PREP, GENITAL: Trich, Wet Prep: NONE SEEN

## 2013-03-07 LAB — GC/CHLAMYDIA PROBE AMP
CT Probe RNA: NEGATIVE
GC Probe RNA: NEGATIVE

## 2013-03-07 MED ORDER — FAMOTIDINE 20 MG PO TABS: 20.0000 mg | ORAL_TABLET | Freq: Two times a day (BID) | ORAL | Status: DC

## 2013-03-07 MED ORDER — HYDROXYZINE HCL 25 MG PO TABS: 25.0000 mg | ORAL_TABLET | Freq: Four times a day (QID) | ORAL | Status: DC | PRN

## 2013-03-07 MED ORDER — PREDNISONE 50 MG PO TABS: 50.0000 mg | ORAL_TABLET | Freq: Every day | ORAL | Status: DC

## 2013-03-07 MED ORDER — HYDROXYZINE HCL 10 MG PO TABS
10.0000 mg | ORAL_TABLET | Freq: Once | ORAL | Status: AC
  Administered 2013-03-07: 10 mg via ORAL
  Filled 2013-03-07: qty 1

## 2013-03-07 MED ORDER — FAMOTIDINE 20 MG PO TABS
20.0000 mg | ORAL_TABLET | Freq: Once | ORAL | Status: AC
  Administered 2013-03-07: 20 mg via ORAL
  Filled 2013-03-07: qty 1

## 2013-03-07 MED ORDER — PREDNISONE 20 MG PO TABS
60.0000 mg | ORAL_TABLET | Freq: Once | ORAL | Status: AC
  Administered 2013-03-07: 60 mg via ORAL
  Filled 2013-03-07: qty 3

## 2013-03-07 MED ORDER — FLUCONAZOLE 150 MG PO TABS
150.0000 mg | ORAL_TABLET | Freq: Once | ORAL | Status: AC
  Administered 2013-03-07: 150 mg via ORAL
  Filled 2013-03-07: qty 1

## 2013-03-07 NOTE — ED Provider Notes (Signed)
CSN: [id number]     Arrival date & time 03/07/13  0747 History   First MD Initiated Contact with Patient 03/07/13 [phone]     Chief Complaint  Patient presents with  . Rash  . Pruritis   (Consider location/radiation/quality/duration/timing/severity/associated sxs/prior Treatment) Patient is a 32 y.o. female presenting with rash.  Rash Associated symptoms: no abdominal pain, no fever, no headaches, no nausea, no shortness of breath and not vomiting    This is a 32 year old female who presents with 3 days of pruritus and rash. Patient states that she was recently diagnosed with an ear infection and placed on amoxicillin. He states after she finished the amoxicillin she had onset of itching and rash. She also noted a fever to 102 which has since resolved. She denies any shortness of breath or difficulty swallowing. Patient denies any other new medications. She is taking Benadryl without any relief.  The patient also states that since she stopped the amoxicillin, she think she has a yeast infection. She endorses white vaginal discharge and itchiness. She has been eating yogurt and thinks this has helped her symptoms.  Past Medical History  Diagnosis Date  . Seizures    Past Surgical History  Procedure Laterality Date  . Cesarean section    . Mouth surgery     Family History  Problem Relation Age of Onset  . Seizures Mother   . Hypertension Mother   . Diabetes Mother   . Seizures Brother    History  Substance Use Topics  . Smoking status: Former Games developer  . Smokeless tobacco: Never Used  . Alcohol Use: Yes     Comment: socially    OB History   Grav Para Term Preterm Abortions TAB SAB Ect Mult Living                 Review of Systems  Constitutional: Negative for fever.  HENT: Negative for facial swelling and trouble swallowing.   Respiratory: Positive for cough. Negative for chest tightness and shortness of breath.   Cardiovascular: Negative for chest pain.  Gastrointestinal:  Negative for nausea, vomiting and abdominal pain.  Genitourinary: Positive for vaginal discharge. Negative for dysuria.  Musculoskeletal: Negative for back pain.  Skin: Positive for rash. Negative for wound.  Neurological: Positive for syncope. Negative for dizziness and headaches.  All other systems reviewed and are negative.    Allergies  Review of patient's allergies indicates no known allergies.  Home Medications   Current Outpatient Rx  Name  Route  Sig  Dispense  Refill  . aspirin-acetaminophen-caffeine (EXCEDRIN EXTRA STRENGTH) 250-250-65 MG per tablet   Oral   Take 2 tablets by mouth every 6 (six) hours as needed for pain (headache).         . famotidine (PEPCID) 20 MG tablet   Oral   Take 1 tablet (20 mg total) by mouth 2 (two) times daily.   30 tablet   0   . hydrOXYzine (ATARAX/VISTARIL) 25 MG tablet   Oral   Take 1 tablet (25 mg total) by mouth every 6 (six) hours as needed for itching.   12 tablet   0   . predniSONE (DELTASONE) 50 MG tablet   Oral   Take 1 tablet (50 mg total) by mouth daily.   4 tablet   0    BP 174/106  Pulse 81  Temp(Src) 98.4 F (36.9 C) (Oral)  Resp 20  Ht 5' 2.5" (1.588 m)  Wt 175 lb (79.379 kg)  BMI  31.48 kg/m2  SpO2 100%  LMP 02/26/2013 Physical Exam  Nursing note and vitals reviewed. Constitutional: She is oriented to person, place, and time. She appears well-developed and well-nourished. No distress.  HENT:  Head: Normocephalic and atraumatic.  Mouth/Throat: Oropharynx is clear and moist.  Eyes: Pupils are equal, round, and reactive to light.  Neck: Neck supple.  Cardiovascular: Normal rate, regular rhythm and normal heart sounds.   Pulmonary/Chest: Effort normal and breath sounds normal. No respiratory distress. She has no wheezes.  Abdominal: Soft. Bowel sounds are normal. There is no tenderness.  Genitourinary:  Normal vaginal vault, thick white discharge noted, no cervical motion tenderness  Neurological: She  is alert and oriented to person, place, and time.  Skin: Skin is warm and dry. Rash noted.  Patient has a diffuse arterial rash involving her face, chest, bilateral arms, and bilateral lower extremities. She is actively itching. No noted excoriations.  Psychiatric: She has a normal mood and affect.    ED Course  Procedures (including critical care time) Labs Review Labs Reviewed  WET PREP, GENITAL - Abnormal; Notable for the following:    Yeast Wet Prep HPF POC FEW (*)    Clue Cells Wet Prep HPF POC TOO NUMEROUS TO COUNT (*)    WBC, Wet Prep HPF POC MANY (*)    All other components within normal limits  GC/CHLAMYDIA PROBE AMP   Imaging Review No results found.  MDM   1. Allergic reaction caused by a drug   2. Yeast infection    This is a 32 year old female who presents with a diffuse rash. She is nontoxic-appearing on exam and has no evidence of wheezing or anaphylaxis. Rash does appear pruritic and urticarial. Patient denies any other new exposures, soaps, or detergents. Patient was given prednisone, Pepcid, and Atarax. And had improvement of her symptoms. Pelvic exam is notable for thick white vaginal discharge.  Wet prep shows he stood. Patient was given 150 mg of Diflucan. Patient had improvement of her symptoms. She will be sent home with a burst dose of steroids, Pepcid, and Atarax.  At this time I feel her symptoms were most likely associated to recent amoxicillin use.  Patient also may have had an ID reaction.  The patient is to followup with her primary care physician as needed. She was instructed to avoid penicillins in the future.  After history, exam, and medical workup I feel the patient has been appropriately medically screened and is safe for discharge home. Pertinent diagnoses were discussed with the patient. Patient was given return precautions.   Shon Baton, MD 03/07/13 1101

## 2013-03-07 NOTE — ED Notes (Signed)
Patient reports that she began itching 3 days ago. Patient also states that she finished taking Amoxicillin when the itching startted. Patient states she has had a fever prior to itching and rash. Patient denies any trouble breathing or swallowing.

## 2013-03-26 ENCOUNTER — Encounter (HOSPITAL_COMMUNITY): Payer: Self-pay | Admitting: *Deleted

## 2013-03-26 ENCOUNTER — Inpatient Hospital Stay (HOSPITAL_COMMUNITY)
Admission: AD | Admit: 2013-03-26 | Discharge: 2013-03-26 | Disposition: A | Discharge status: Home / Self Care | Payer: Medicaid Other | Source: Ambulatory Visit | Attending: Obstetrics & Gynecology | Admitting: Obstetrics & Gynecology

## 2013-03-26 ENCOUNTER — Inpatient Hospital Stay (HOSPITAL_COMMUNITY): Payer: Medicaid Other

## 2013-03-26 DIAGNOSIS — O26899 Other specified pregnancy related conditions, unspecified trimester: Secondary | ICD-10-CM

## 2013-03-26 DIAGNOSIS — R109 Unspecified abdominal pain: Secondary | ICD-10-CM | POA: Insufficient documentation

## 2013-03-26 DIAGNOSIS — Z3490 Encounter for supervision of normal pregnancy, unspecified, unspecified trimester: Secondary | ICD-10-CM

## 2013-03-26 DIAGNOSIS — O26859 Spotting complicating pregnancy, unspecified trimester: Secondary | ICD-10-CM | POA: Insufficient documentation

## 2013-03-26 HISTORY — DX: Anxiety disorder, unspecified: F41.9

## 2013-03-26 HISTORY — DX: Headache: R51

## 2013-03-26 HISTORY — DX: Essential (primary) hypertension: I10

## 2013-03-26 HISTORY — DX: Chlamydial infection, unspecified: A74.9

## 2013-03-26 LAB — CBC WITH DIFFERENTIAL/PLATELET
Basophils Absolute: 0 10*3/uL (ref 0.0–0.1)
Basophils Relative: 0 % (ref 0–1)
Eosinophils Absolute: 0 10*3/uL (ref 0.0–0.7)
Eosinophils Relative: 0 % (ref 0–5)
MCH: 26 pg (ref 26.0–34.0)
MCHC: 32 g/dL (ref 30.0–36.0)
MCV: 81.3 fL (ref 78.0–100.0)
Monocytes Absolute: 0.4 10*3/uL (ref 0.1–1.0)
Platelets: 252 10*3/uL (ref 150–400)
RDW: 16.8 % — ABNORMAL HIGH (ref 11.5–15.5)
WBC: 6.5 10*3/uL (ref 4.0–10.5)

## 2013-03-26 LAB — URINALYSIS, ROUTINE W REFLEX MICROSCOPIC
Bilirubin Urine: NEGATIVE
Ketones, ur: NEGATIVE mg/dL
Nitrite: NEGATIVE
Protein, ur: NEGATIVE mg/dL
Specific Gravity, Urine: 1.02 (ref 1.005–1.030)
Urobilinogen, UA: 0.2 mg/dL (ref 0.0–1.0)

## 2013-03-26 LAB — HCG, QUANTITATIVE, PREGNANCY: hCG, Beta Chain, Quant, S: 5535 m[IU]/mL — ABNORMAL HIGH (ref <5)

## 2013-03-26 MED ORDER — LABETALOL HCL 100 MG PO TABS
200.0000 mg | ORAL_TABLET | Freq: Once | ORAL | Status: AC
  Administered 2013-03-26: 200 mg via ORAL
  Filled 2013-03-26: qty 2

## 2013-03-26 MED ORDER — LABETALOL HCL 100 MG PO TABS: 200.0000 mg | ORAL_TABLET | Freq: Two times a day (BID) | ORAL | Status: DC

## 2013-03-26 NOTE — MAU Note (Signed)
Pt states LMP-02/19/2013, had positive upt at home last evening. Denies abnormal vaginal discharge or bleeding.

## 2013-03-26 NOTE — MAU Provider Note (Signed)

## 2013-03-26 NOTE — MAU Provider Note (Signed)
History     CSN: [id number]  Arrival date and time: 03/26/13 1803   First Provider Initiated Contact with Patient 03/26/13 1907      Chief Complaint  Patient presents with  . Abdominal Pain   HPI Mary Roberts is 32 y.o. [id number] [redacted]w[redacted]d weeks presenting with intermittent cramping.   Her period is late, saw light spotting day cycle was due but none since.  LMP 02/19/13.  Was using condoms off and on.  Had + UPT.  Hx of seizures and elevated blood pressure.  Is not medicated for either at this time. She was [facility] at [facility] on [facility]/14 with abdominal pain.  GC/CHL cultures were neg that date.  Blood pressures were elevated at that time.  She was a patient of the high risk clinic with pregnancy 7 years ago and would like to have prenatal care there.     Past Medical History  Diagnosis Date  . Seizures     No meds. currently (03/26/2013)  . Hypertension   . Headache(784.0)     migraines  . Anxiety   . Chlamydia     Past Surgical History  Procedure Laterality Date  . Cesarean section    . Mouth surgery      Family History  Problem Relation Age of Onset  . Seizures Mother   . Hypertension Mother   . Diabetes Mother   . Seizures Brother     History  Substance Use Topics  . Smoking status: Former Smoker    Types: Cigarettes  . Smokeless tobacco: Never Used  . Alcohol Use: Yes     Comment: socially     Allergies:  Allergies  Allergen Reactions  . Amoxicillin Hives    Prescriptions prior to admission  Medication Sig Dispense Refill  . aspirin-acetaminophen-caffeine (EXCEDRIN EXTRA STRENGTH) 250-250-65 MG per tablet Take 2 tablets by mouth every 6 (six) hours as needed for pain (headache).      . famotidine (PEPCID) 20 MG tablet Take 1 tablet (20 mg total) by mouth 2 (two) times daily.  30 tablet  0  . ferrous sulfate 325 (65 FE) MG tablet Take 325 mg by mouth daily with breakfast.        Review of Systems  Constitutional: Negative for fever and chills.  Respiratory:  Negative.   Cardiovascular: Negative.   Gastrointestinal: Positive for abdominal pain (cramping). Negative for nausea and vomiting.  Genitourinary: Negative for dysuria, urgency, frequency, hematuria and flank pain.       Spotted 2 weeks ago  Neurological: Negative for headaches.   Physical Exam   Blood pressure 172/102, pulse 93, temperature 97.9 F (36.6 C), temperature source Oral, resp. rate 18, height 5' 2.5" (1.588 m), weight 174 lb (78.926 kg), last menstrual period 02/19/2013.  Physical Exam  Constitutional: She is oriented to person, place, and time. She appears well-developed and well-nourished. No distress.  HENT:  Head: Normocephalic.  Neck: Normal range of motion.  Cardiovascular: Normal rate.   Respiratory: Effort normal.  GI: Soft. She exhibits no distension and no mass. There is no tenderness. There is no rebound and no guarding.  Genitourinary: Uterus is not enlarged and not tender. Cervix exhibits no motion tenderness, no discharge and no friability. Right adnexum displays no mass, no tenderness and no fullness. Left adnexum displays no mass, no tenderness and no fullness. There is bleeding (small amount of pink spotting) around the vagina. No erythema or tenderness around the vagina. No foreign body around the vagina. No  signs of injury around the vagina. No vaginal discharge found.  Neurological: She is alert and oriented to person, place, and time.  Skin: Skin is warm and dry.  Psychiatric: She has a normal mood and affect. Her behavior is normal.   Results for orders placed during the hospital encounter of 03/26/13 (from the past 24 hour(s))  URINALYSIS, ROUTINE W REFLEX MICROSCOPIC     Status: None   Collection Time    03/26/13  6:39 PM      Result Value Range   Color, Urine YELLOW  YELLOW   APPearance CLEAR  CLEAR   Specific Gravity, Urine 1.020  1.005 - 1.030   pH 6.0  5.0 - 8.0   Glucose, UA NEGATIVE  NEGATIVE mg/dL   Hgb urine dipstick NEGATIVE  NEGATIVE    Bilirubin Urine NEGATIVE  NEGATIVE   Ketones, ur NEGATIVE  NEGATIVE mg/dL   Protein, ur NEGATIVE  NEGATIVE mg/dL   Urobilinogen, UA 0.2  0.0 - 1.0 mg/dL   Nitrite NEGATIVE  NEGATIVE   Leukocytes, UA NEGATIVE  NEGATIVE  POCT PREGNANCY, URINE     Status: Abnormal   Collection Time    03/26/13  6:48 PM      Result Value Range   Preg Test, Ur POSITIVE (*) NEGATIVE  WET PREP, GENITAL     Status: Abnormal   Collection Time    03/26/13  7:23 PM      Result Value Range   Yeast Wet Prep HPF POC NONE SEEN  NONE SEEN   Trich, Wet Prep NONE SEEN  NONE SEEN   Clue Cells Wet Prep HPF POC FEW (*) NONE SEEN   WBC, Wet Prep HPF POC FEW (*) NONE SEEN  CBC WITH DIFFERENTIAL     Status: Abnormal   Collection Time    03/26/13  7:29 PM      Result Value Range   WBC 6.5  4.0 - 10.5 K/uL   RBC 3.69 (*) 3.87 - 5.11 MIL/uL   Hemoglobin 9.6 (*) 12.0 - 15.0 g/dL   HCT 45.4 (*) 09.8 - 11.9 %   MCV 81.3  78.0 - 100.0 fL   MCH 26.0  26.0 - 34.0 pg   MCHC 32.0  30.0 - 36.0 g/dL   RDW 14.7 (*) 82.9 - 56.2 %   Platelets 252  150 - 400 K/uL   Neutrophils Relative % 74  43 - 77 %   Neutro Abs 4.8  1.7 - 7.7 K/uL   Lymphocytes Relative 19  12 - 46 %   Lymphs Abs 1.2  0.7 - 4.0 K/uL   Monocytes Relative 6  3 - 12 %   Monocytes Absolute 0.4  0.1 - 1.0 K/uL   Eosinophils Relative 0  0 - 5 %   Eosinophils Absolute 0.0  0.0 - 0.7 K/uL   Basophils Relative 0  0 - 1 %   Basophils Absolute 0.0  0.0 - 0.1 K/uL  HCG, QUANTITATIVE, PREGNANCY     Status: Abnormal   Collection Time    03/26/13  7:29 PM      Result Value Range   hCG, Beta Chain, Quant, S 5535 (*) <5 mIU/mL  ABO/RH     Status: None   Collection Time    03/26/13  7:29 PM      Result Value Range   ABO/RH(D) O POS    US Ob Comp Less 14 Wks  03/26/2013   CLINICAL DATA:  Pelvic pain and vaginal bleeding. Positive  pregnancy test  EXAM: OBSTETRIC <14 WK [place] AND TRANSVAGINAL OB US  TECHNIQUE: Both transabdominal and transvaginal ultrasound examinations  were performed for complete evaluation of the gestation as well as the maternal uterus, adnexal regions, and pelvic cul-de-sac. Transvaginal technique was performed to assess early pregnancy.  COMPARISON:  No similar recent comparison examination.  FINDINGS: Intrauterine gestational sac: Visualized/normal in shape.  Yolk sac:  Visualized  Embryo:  Not visualized  Cardiac Activity: Not visualized  MSD: 5 mm   5 w   0  d             [place] EDC: 11/26/13  Maternal uterus/adnexae: The ovaries are normal  IMPRESSION: Intrauterine gestational sac and yolk sac identified but no fetal pole or cardiac activity yet seen. Followup ultrasound is recommended in 10-14 days to document appropriate pregnancy progression.   Electronically Signed   By: Christiana Pellant M.D.   On: 03/26/2013 20:01   US Ob Transvaginal  03/26/2013   CLINICAL DATA:  Pelvic pain and vaginal bleeding. Positive pregnancy test  EXAM: OBSTETRIC <14 WK [place] AND TRANSVAGINAL OB US  TECHNIQUE: Both transabdominal and transvaginal ultrasound examinations were performed for complete evaluation of the gestation as well as the maternal uterus, adnexal regions, and pelvic cul-de-sac. Transvaginal technique was performed to assess early pregnancy.  COMPARISON:  No similar recent comparison examination.  FINDINGS: Intrauterine gestational sac: Visualized/normal in shape.  Yolk sac:  Visualized  Embryo:  Not visualized  Cardiac Activity: Not visualized  MSD: 5 mm   5 w   0  d             [place] EDC: 11/26/13  Maternal uterus/adnexae: The ovaries are normal  IMPRESSION: Intrauterine gestational sac and yolk sac identified but no fetal pole or cardiac activity yet seen. Followup ultrasound is recommended in 10-14 days to document appropriate pregnancy progression.   Electronically Signed   By: Christiana Pellant M.D.   On: 03/26/2013 20:01  MAU Course  Procedures  MDM  Discussed Blood Pressures with Dr. Macon Large.  Order given to begin Labetalol 200mg  bid.    Assessment and Plan   A:  Cramping/spotting in early pregnancy      Intrauterine pregancy [redacted]w[redacted]d with Yolk Sac      Hx of seizures     Hx of elevated blood pressure  P:  Encouraged patient to begin prenatal care with OB clinic-call Monday for appt       Rx for Labetalol       Instructed to begin prenatal vitamins           Pelvic rest until spotting stops  KEY,EVE M 03/26/2013, 8:33 PM

## 2013-03-26 NOTE — MAU Note (Signed)
Notes mid abdominal pain that is intermittent, rates 2/10.

## 2013-03-31 ENCOUNTER — Inpatient Hospital Stay (HOSPITAL_COMMUNITY): Payer: Medicaid Other

## 2013-03-31 ENCOUNTER — Encounter (HOSPITAL_COMMUNITY): Payer: Self-pay

## 2013-03-31 ENCOUNTER — Inpatient Hospital Stay (HOSPITAL_COMMUNITY)
Admission: AD | Admit: 2013-03-31 | Discharge: 2013-04-01 | Disposition: A | Discharge status: Home / Self Care | Payer: Medicaid Other | Source: Ambulatory Visit | Attending: Obstetrics and Gynecology | Admitting: Obstetrics and Gynecology

## 2013-03-31 DIAGNOSIS — R059 Cough, unspecified: Secondary | ICD-10-CM | POA: Insufficient documentation

## 2013-03-31 DIAGNOSIS — R109 Unspecified abdominal pain: Secondary | ICD-10-CM | POA: Insufficient documentation

## 2013-03-31 DIAGNOSIS — R05 Cough: Secondary | ICD-10-CM | POA: Insufficient documentation

## 2013-03-31 DIAGNOSIS — O99891 Other specified diseases and conditions complicating pregnancy: Secondary | ICD-10-CM | POA: Insufficient documentation

## 2013-03-31 DIAGNOSIS — R509 Fever, unspecified: Secondary | ICD-10-CM | POA: Insufficient documentation

## 2013-03-31 LAB — URINALYSIS, ROUTINE W REFLEX MICROSCOPIC
Glucose, UA: NEGATIVE mg/dL
Ketones, ur: NEGATIVE mg/dL
Leukocytes, UA: NEGATIVE
Nitrite: NEGATIVE
Specific Gravity, Urine: 1.025 (ref 1.005–1.030)
pH: 6.5 (ref 5.0–8.0)

## 2013-03-31 LAB — CBC WITH DIFFERENTIAL/PLATELET
Basophils Absolute: 0 10*3/uL (ref 0.0–0.1)
Eosinophils Absolute: 0 10*3/uL (ref 0.0–0.7)
Hemoglobin: 9 g/dL — ABNORMAL LOW (ref 12.0–15.0)
Lymphocytes Relative: 15 % (ref 12–46)
Lymphs Abs: 0.9 10*3/uL (ref 0.7–4.0)
Monocytes Relative: 13 % — ABNORMAL HIGH (ref 3–12)
Neutro Abs: 4.3 10*3/uL (ref 1.7–7.7)
Neutrophils Relative %: 72 % (ref 43–77)
RBC: 3.38 MIL/uL — ABNORMAL LOW (ref 3.87–5.11)

## 2013-03-31 LAB — URINE MICROSCOPIC-ADD ON

## 2013-03-31 MED ORDER — LACTATED RINGERS IV BOLUS (SEPSIS)
1000.0000 mL | Freq: Once | INTRAVENOUS | Status: AC
  Administered 2013-03-31: 1000 mL via INTRAVENOUS

## 2013-03-31 MED ORDER — ACETAMINOPHEN 500 MG PO TABS
1000.0000 mg | ORAL_TABLET | Freq: Once | ORAL | Status: AC
  Administered 2013-03-31: 1000 mg via ORAL
  Filled 2013-03-31: qty 2

## 2013-03-31 NOTE — MAU Provider Note (Signed)
Chief Complaint: Fever and Cough   First Provider Initiated Contact with Patient 03/31/13 2212      SUBJECTIVE HPI: Mary Roberts is a 32 y.o. [id number] at [redacted]w[redacted]d by LMP who presents with: 1. chills since last night. 2. Fever 102.3 this evening. 3. Nonproductive cough x1 week. 4. To watery stools yesterday. None today. 5. Contact with child who had febrile illness without rash 5 days ago. Child's sibling was diagnosed with hand foot mouth disease earlier the same week. 6. Mild cramping throughout entire low abdomen since yesterday. 7. Body aches. 8. Generalized, throbbing headache since this evening. Has not taken anything for it.  Denies international travel, vaginal bleeding, vaginal discharge, rash, nausea, vomiting, urinary complaints, flank pain, neck stiffness, sore throat, rhinorrhea or congestion.  Seen in maternity admissions 03/26/2013 for threatened miscarriage. Ultrasound showed gestational sac with yolk sac. No fetal pole or cardiac activity seen. Wet prep GC/28 Chlamydia negative within last month. Declines repeat testing.  Known HTN. On Labetalol. Taking as directed. Last dose this morning.   Past Medical History  Diagnosis Date  . Seizures     No meds. currently (03/26/2013)  . Hypertension   . Headache(784.0)     migraines  . Anxiety   . Chlamydia    OB History  Gravida Para Term Preterm AB SAB TAB Ectopic Multiple Living  4 2 2  1  1   2     # Outcome Date GA Lbr Len/2nd Weight Sex Delivery Anes PTL Lv  4 CUR           3 TAB           2 TRM           1 TRM              Past Surgical History  Procedure Laterality Date  . Cesarean section    . Mouth surgery     History   Social History  . Marital Status: Single    Spouse Name: N/A    Number of Children: N/A  . Years of Education: N/A   Occupational History  . Not on file.   Social History Main Topics  . Smoking status: Former Smoker    Types: Cigarettes  . Smokeless tobacco: Never Used  .  Alcohol Use: No     Comment: socially   . Drug Use: No  . Sexual Activity: Yes   Other Topics Concern  . Not on file   Social History Narrative  . No narrative on file   No current facility-administered medications on file prior to encounter.   Current Outpatient Prescriptions on File Prior to Encounter  Medication Sig Dispense Refill  . aspirin-acetaminophen-caffeine (EXCEDRIN EXTRA STRENGTH) 250-250-65 MG per tablet Take 2 tablets by mouth every 6 (six) hours as needed for pain (headache).      . famotidine (PEPCID) 20 MG tablet Take 1 tablet (20 mg total) by mouth 2 (two) times daily.  30 tablet  0  . ferrous sulfate 325 (65 FE) MG tablet Take 325 mg by mouth daily with breakfast.      . labetalol (NORMODYNE) 100 MG tablet Take 2 tablets (200 mg total) by mouth 2 (two) times daily.  60 tablet  1   Allergies  Allergen Reactions  . Amoxicillin Hives    ROS: Pertinent items in HPI.  OBJECTIVE Blood pressure 163/87, pulse 98, temperature 102.9 F (39.4 C), temperature source Oral, resp. rate 18, last menstrual period 02/19/2013, SpO2  100.00%. Patient Vitals for the past 24 hrs:  BP Temp Temp src Pulse Resp SpO2  04/01/13 0002 141/76 mmHg - - 84 - -  03/31/13 2354 - 99.6 F (37.6 C) Oral - - -  03/31/13 2145 163/87 mmHg 102.9 F (39.4 C) Oral 98 18 100 %    GENERAL: Well-developed, well-nourished female in no acute distress. Well-appearing.  HEENT: Normocephalic. Normal neck ROM. Sinuses NT. PERRLA. Throat clear.  HEART: normal rate RESP: normal effort and rate. CTAB. No egophony.  ABDOMEN: Soft, non-tender. No CVAT. EXTREMITIES: Nontender, no edema NEURO: Alert and oriented SPECULUM EXAM: Deferred  LAB RESULTS Results for orders placed during the hospital encounter of 03/31/13 (from the past 24 hour(s))  URINALYSIS, ROUTINE W REFLEX MICROSCOPIC     Status: Abnormal   Collection Time    03/31/13  9:29 PM      Result Value Range   Color, Urine YELLOW  YELLOW    APPearance CLEAR  CLEAR   Specific Gravity, Urine 1.025  1.005 - 1.030   pH 6.5  5.0 - 8.0   Glucose, UA NEGATIVE  NEGATIVE mg/dL   Hgb urine dipstick MODERATE (*) NEGATIVE   Bilirubin Urine NEGATIVE  NEGATIVE   Ketones, ur NEGATIVE  NEGATIVE mg/dL   Protein, ur NEGATIVE  NEGATIVE mg/dL   Urobilinogen, UA 0.2  0.0 - 1.0 mg/dL   Nitrite NEGATIVE  NEGATIVE   Leukocytes, UA NEGATIVE  NEGATIVE  URINE MICROSCOPIC-ADD ON     Status: Abnormal   Collection Time    03/31/13  9:29 PM      Result Value Range   Squamous Epithelial / LPF FEW (*) RARE   WBC, UA 0-2  <3 WBC/hpf   RBC / HPF 3-6  <3 RBC/hpf   Bacteria, UA FEW (*) RARE   Urine-Other MUCOUS PRESENT    CBC WITH DIFFERENTIAL     Status: Abnormal   Collection Time    03/31/13  9:50 PM      Result Value Range   WBC 6.0  4.0 - 10.5 K/uL   RBC 3.38 (*) 3.87 - 5.11 MIL/uL   Hemoglobin 9.0 (*) 12.0 - 15.0 g/dL   HCT 10.2 (*) 72.5 - 36.6 %   MCV 80.2  78.0 - 100.0 fL   MCH 26.6  26.0 - 34.0 pg   MCHC 33.2  30.0 - 36.0 g/dL   RDW 44.0 (*) 34.7 - 42.5 %   Platelets 239  150 - 400 K/uL   Neutrophils Relative % 72  43 - 77 %   Neutro Abs 4.3  1.7 - 7.7 K/uL   Lymphocytes Relative 15  12 - 46 %   Lymphs Abs 0.9  0.7 - 4.0 K/uL   Monocytes Relative 13 (*) 3 - 12 %   Monocytes Absolute 0.8  0.1 - 1.0 K/uL   Eosinophils Relative 0  0 - 5 %   Eosinophils Absolute 0.0  0.0 - 0.7 K/uL   Basophils Relative 0  0 - 1 %   Basophils Absolute 0.0  0.0 - 0.1 K/uL    IMAGING US Ob Transvaginal  03/31/2013   *RADIOLOGY REPORT*  Clinical Data: Pelvic cramping.  TRANSVAGINAL OBSTETRIC US  Technique:  Transvaginal ultrasound was performed for complete evaluation of the gestation as well as the maternal uterus, adnexal regions, and pelvic cul-de-sac.  Comparison:  Ultrasound of pregnancy performed 03/26/2013  Intrauterine gestational sac: Visualized/normal in shape. Yolk sac: Yes Embryo: No Cardiac Activity: N/A  MSD: 11.3  mm  5    w 6    d        [place]  EDC: 11/25/2013  Maternal uterus/adnexae: A small amount of subchorionic hemorrhage is noted.  The uterus is otherwise unremarkable in appearance.  The right ovary is unremarkable in appearance, measuring 3.2 x 2.8 x 2.6 cm.  The left ovary is not visualized, though it was characterized on the recent prior study.  There is no evidence for ovarian torsion.  No free fluid is seen within the pelvic cul-de-sac.  IMPRESSION:  1.  Single intrauterine gestational sac noted, with a mean sac diameter of 1.1 cm, corresponding to a gestational age of [redacted] weeks 6 days.  This matches the gestational age of [redacted] weeks 5 days by the first ultrasound, reflecting an estimated date of delivery of November 26, 2013.  The embryo is not yet visible. 2.  Small subchorionic hemorrhage noted.   Original Report Authenticated By: Tonia Ghent, M.D.   MAU COURSE Fever resolved w/ Tylenol and IV fluids. Per consult w/ Dr. Emelda Fear CXR not needed. Blood cultures x 2 collected.   Care of pt tuned over to Wynelle Bourgeois, CNM at at [facility].  [place], CNM 04/01/2013  00:24 AM  States feels better Will d/c home with supportive care Call clinic if not better in 2-3 days Has appt for new OB appt  Aviva Signs, CNM

## 2013-03-31 NOTE — MAU Note (Signed)
Non productive cough x 1 week. Fever/chills since yesterday. Temp 102.2 at home tonight. 3 episodes of watery stools yesterday; has not had a BM today. Denies n/v. Denies VB or vaginal discharge.  Some abdominal cramping today.

## 2013-04-01 DIAGNOSIS — R509 Fever, unspecified: Secondary | ICD-10-CM

## 2013-04-01 LAB — CULTURE, OB URINE: Colony Count: NO GROWTH

## 2013-04-01 MED ORDER — ACETAMINOPHEN 325 MG PO TABS: 650.0000 mg | ORAL_TABLET | Freq: Four times a day (QID) | ORAL | Status: DC | PRN

## 2013-04-03 ENCOUNTER — Encounter (HOSPITAL_COMMUNITY): Payer: Self-pay | Admitting: *Deleted

## 2013-04-03 ENCOUNTER — Inpatient Hospital Stay (HOSPITAL_COMMUNITY)
Admission: AD | Admit: 2013-04-03 | Discharge: 2013-04-03 | Disposition: A | Discharge status: Home / Self Care | Payer: Medicaid Other | Source: Ambulatory Visit | Attending: Obstetrics & Gynecology | Admitting: Obstetrics & Gynecology

## 2013-04-03 DIAGNOSIS — R509 Fever, unspecified: Secondary | ICD-10-CM

## 2013-04-03 DIAGNOSIS — B9689 Other specified bacterial agents as the cause of diseases classified elsewhere: Secondary | ICD-10-CM | POA: Insufficient documentation

## 2013-04-03 DIAGNOSIS — O2390 Unspecified genitourinary tract infection in pregnancy, unspecified trimester: Secondary | ICD-10-CM | POA: Insufficient documentation

## 2013-04-03 DIAGNOSIS — A499 Bacterial infection, unspecified: Secondary | ICD-10-CM | POA: Insufficient documentation

## 2013-04-03 DIAGNOSIS — N760 Acute vaginitis: Secondary | ICD-10-CM | POA: Insufficient documentation

## 2013-04-03 LAB — URINE MICROSCOPIC-ADD ON

## 2013-04-03 LAB — WET PREP, GENITAL
Trich, Wet Prep: NONE SEEN
Yeast Wet Prep HPF POC: NONE SEEN

## 2013-04-03 LAB — CBC WITH DIFFERENTIAL/PLATELET
Basophils Absolute: 0 10*3/uL (ref 0.0–0.1)
Basophils Relative: 0 % (ref 0–1)
Eosinophils Absolute: 0 10*3/uL (ref 0.0–0.7)
Eosinophils Relative: 0 % (ref 0–5)
HCT: 28.3 % — ABNORMAL LOW (ref 36.0–46.0)
Hemoglobin: 9.2 g/dL — ABNORMAL LOW (ref 12.0–15.0)
Lymphocytes Relative: 12 % (ref 12–46)
Lymphs Abs: 0.6 10*3/uL — ABNORMAL LOW (ref 0.7–4.0)
MCH: 26.1 pg (ref 26.0–34.0)
MCHC: 32.5 g/dL (ref 30.0–36.0)
MCV: 80.2 fL (ref 78.0–100.0)
Monocytes Absolute: 0.5 10*3/uL (ref 0.1–1.0)
Monocytes Relative: 9 % (ref 3–12)
Neutro Abs: 4 10*3/uL (ref 1.7–7.7)
Neutrophils Relative %: 78 % — ABNORMAL HIGH (ref 43–77)
Platelets: 230 10*3/uL (ref 150–400)
RBC: 3.53 MIL/uL — ABNORMAL LOW (ref 3.87–5.11)
RDW: 16.6 % — ABNORMAL HIGH (ref 11.5–15.5)
WBC: 5.1 10*3/uL (ref 4.0–10.5)

## 2013-04-03 LAB — INFLUENZA PANEL BY PCR (TYPE A & B)
H1N1 flu by pcr: NOT DETECTED
Influenza A By PCR: NEGATIVE
Influenza B By PCR: NEGATIVE

## 2013-04-03 LAB — URINALYSIS, ROUTINE W REFLEX MICROSCOPIC
Bilirubin Urine: NEGATIVE
Leukocytes, UA: NEGATIVE
Nitrite: NEGATIVE
Specific Gravity, Urine: 1.015 (ref 1.005–1.030)
pH: 7.5 (ref 5.0–8.0)

## 2013-04-03 MED ORDER — METRONIDAZOLE 500 MG PO TABS: 500.0000 mg | ORAL_TABLET | Freq: Two times a day (BID) | ORAL | Status: DC

## 2013-04-03 MED ORDER — IBUPROFEN 600 MG PO TABS
600.0000 mg | ORAL_TABLET | Freq: Once | ORAL | Status: AC
  Administered 2013-04-03: 600 mg via ORAL
  Filled 2013-04-03: qty 1

## 2013-04-03 MED ORDER — ACETAMINOPHEN 325 MG PO TABS
650.0000 mg | ORAL_TABLET | Freq: Once | ORAL | Status: AC
  Administered 2013-04-03: 650 mg via ORAL
  Filled 2013-04-03: qty 2

## 2013-04-03 NOTE — MAU Provider Note (Signed)
Attestation of Attending Supervision of Advanced Practitioner (PA/CNM/NP): Evaluation and management procedures were performed by the Advanced Practitioner under my supervision and collaboration.  I have reviewed the Advanced Practitioner's note and chart, and I agree with the management and plan.  Tristian Sickinger, MD, FACOG Attending Obstetrician & Gynecologist Faculty Practice, [facility]  

## 2013-04-03 NOTE — MAU Provider Note (Addendum)
CSN: [id number]     Arrival date & time 04/03/13  1543 History   None    Chief Complaint  Patient presents with  . Fever   (Consider location/radiation/quality/duration/timing/severity/associated sxs/prior Treatment) HPI Mary Roberts is a 32 y.o. [id number] at [redacted]w[redacted]d. She presents wi h continued fevers to 102. She had MAU visit 10/8- CBC, urine C&S and blood cultures are all neg. Today she denies loose BM's, UTI S&S and URI S&S except non productive cough. No runny nose or sore throat. She had ear infection ~ 4 wks ago,may be coming back. Denies muscle aches. No recent international travel, no contact with anyone who traveled internationally. GC/CT neg 03/07/13, no sexual contact since that time.   Past Medical History  Diagnosis Date  . Seizures     No meds. currently (03/26/2013)  . Hypertension   . Headache(784.0)     migraines  . Anxiety   . Chlamydia    Past Surgical History  Procedure Laterality Date  . Cesarean section    . Mouth surgery     Family History  Problem Relation Age of Onset  . Seizures Mother   . Hypertension Mother   . Diabetes Mother   . Seizures Brother    History  Substance Use Topics  . Smoking status: Former Smoker    Types: Cigarettes  . Smokeless tobacco: Never Used  . Alcohol Use: No     Comment: socially    OB History   Grav Para Term Preterm Abortions TAB SAB Ect Mult Living   4 2 2  1 1    2      Review of Systems  Constitutional: Positive for fever, chills and fatigue.  HENT: Positive for ear pain. Negative for congestion, rhinorrhea, sinus pressure and sore throat.   Respiratory: Positive for cough.   Gastrointestinal: Negative for nausea, vomiting, abdominal pain, diarrhea and constipation.  Genitourinary: Positive for frequency. Negative for dysuria, urgency, hematuria, flank pain, vaginal bleeding, vaginal discharge and pelvic pain.    Allergies  Amoxicillin  Home Medications  No current outpatient prescriptions on file. BP  149/89  Pulse 99  Temp(Src) 102.8 F (39.3 C) (Oral)  Resp 18  Ht 5' 2.5" (1.588 m)  Wt 174 lb (78.926 kg)  BMI 31.3 kg/m2  LMP 02/19/2013 Physical Exam  Vitals reviewed. Constitutional: She is oriented to person, place, and time. She appears well-developed and well-nourished.  HENT:  Head: Normocephalic.  Right Ear: External ear normal.  Left Ear: External ear normal.  TM clear bil  Cardiovascular: Normal rate and regular rhythm.   Pulmonary/Chest: Effort normal and breath sounds normal.  Abdominal: Soft. Bowel sounds are normal. She exhibits no distension. There is no tenderness. There is no rebound and no guarding.  Genitourinary:  Pelvic exam: Ext gen- nl anatomy, skin intact Vagina- small amt thin white discharge Cx closed long Ut- non-tender, nl size Adn- no masses palp, non-tender  Musculoskeletal: Normal range of motion.  Neurological: She is alert and oriented to person, place, and time.  Skin: Skin is warm and dry. She is not diaphoretic.  Psychiatric: She has a normal mood and affect. Her behavior is normal.    ED Course  Procedures (including critical care time) Labs Review Labs Reviewed  URINALYSIS, ROUTINE W REFLEX MICROSCOPIC   Results for orders placed during the hospital encounter of 04/03/13 (from the past 24 hour(s))  URINALYSIS, ROUTINE W REFLEX MICROSCOPIC     Status: Abnormal   Collection Time  04/03/13  4:06 PM      Result Value Range   Color, Urine YELLOW  YELLOW   APPearance CLEAR  CLEAR   Specific Gravity, Urine 1.015  1.005 - 1.030   pH 7.5  5.0 - 8.0   Glucose, UA NEGATIVE  NEGATIVE mg/dL   Hgb urine dipstick SMALL (*) NEGATIVE   Bilirubin Urine NEGATIVE  NEGATIVE   Ketones, ur NEGATIVE  NEGATIVE mg/dL   Protein, ur NEGATIVE  NEGATIVE mg/dL   Urobilinogen, UA 1.0  0.0 - 1.0 mg/dL   Nitrite NEGATIVE  NEGATIVE   Leukocytes, UA NEGATIVE  NEGATIVE  URINE MICROSCOPIC-ADD ON     Status: Abnormal   Collection Time    04/03/13  4:06 PM       Result Value Range   Squamous Epithelial / LPF FEW (*) RARE   WBC, UA 0-2  <3 WBC/hpf   RBC / HPF 11-20  <3 RBC/hpf   Bacteria, UA FEW (*) RARE  CBC WITH DIFFERENTIAL     Status: Abnormal   Collection Time    04/03/13  5:25 PM      Result Value Range   WBC 5.1  4.0 - 10.5 K/uL   RBC 3.53 (*) 3.87 - 5.11 MIL/uL   Hemoglobin 9.2 (*) 12.0 - 15.0 g/dL   HCT 16.1 (*) 09.6 - 04.5 %   MCV 80.2  78.0 - 100.0 fL   MCH 26.1  26.0 - 34.0 pg   MCHC 32.5  30.0 - 36.0 g/dL   RDW 40.9 (*) 81.1 - 91.4 %   Platelets 230  150 - 400 K/uL   Neutrophils Relative % 78 (*) 43 - 77 %   Neutro Abs 4.0  1.7 - 7.7 K/uL   Lymphocytes Relative 12  12 - 46 %   Lymphs Abs 0.6 (*) 0.7 - 4.0 K/uL   Monocytes Relative 9  3 - 12 %   Monocytes Absolute 0.5  0.1 - 1.0 K/uL   Eosinophils Relative 0  0 - 5 %   Eosinophils Absolute 0.0  0.0 - 0.7 K/uL   Basophils Relative 0  0 - 1 %   Basophils Absolute 0.0  0.0 - 0.1 K/uL  INFLUENZA PANEL BY PCR     Status: None   Collection Time    04/03/13  5:30 PM      Result Value Range   Influenza A By PCR NEGATIVE  NEGATIVE   Influenza B By PCR NEGATIVE  NEGATIVE   H1N1 flu by pcr NOT DETECTED  NOT DETECTED  WET PREP, GENITAL     Status: Abnormal   Collection Time    04/03/13  8:10 PM      Result Value Range   Yeast Wet Prep HPF POC NONE SEEN  NONE SEEN   Trich, Wet Prep NONE SEEN  NONE SEEN   Clue Cells Wet Prep HPF POC FEW (*) NONE SEEN   WBC, Wet Prep HPF POC FEW (*) NONE SEEN    Imaging Review No results found.  Care to [facility], [place] [address]  2245 Pt reports feeling better since ibuprofen.  Temp decreases to  Filed Vitals:   04/03/13 2216  BP: 133/65  Pulse: 87  Temp: 99.5 F (37.5 C)  Resp: 16    Lungs clear to auscultation.  Pt does not identify any areas of pain or concern.    MDM  No diagnosis found. 2205 Consulted with Dr. Macon Large > reviewed HPI/exam/labs/vitals/response to meds> agrees with plan  of care A: Bacterial Vaginosis Fever -  Unknown Etiology  P: Discharge to home RX Flagyl Tylenol and Ibuprofen as needed for fever Send urine culture Follow-up if not improvement in fever with med or for additional symptoms.  [facility]

## 2013-04-03 NOTE — MAU Note (Signed)
Patient presents with complaint of fever; states that she was seen in MAU 2 days ago for same complaint and was discharged with meds that she has been taking as prescribed but the fever is stil present.

## 2013-04-04 NOTE — MAU Provider Note (Signed)
Attestation of Attending Supervision of Advanced Practitioner (PA/CNM/NP): Evaluation and management procedures were performed by the Advanced Practitioner under my supervision and collaboration.  I have reviewed the Advanced Practitioner's note and chart, and I agree with the management and plan.  Sherlynn Tourville, MD, FACOG Attending Obstetrician & Gynecologist Faculty Practice, [facility]  

## 2013-04-05 LAB — URINE CULTURE
Colony Count: NO GROWTH
Culture: NO GROWTH

## 2013-04-07 LAB — CULTURE, BLOOD (ROUTINE X 2): Culture: NO GROWTH

## 2013-04-09 NOTE — MAU Provider Note (Signed)
Attestation of Attending Supervision of Advanced Practitioner: Evaluation and management procedures were performed by the PA/NP/CNM/OB Fellow under my supervision/collaboration. Chart reviewed and agree with management and plan.  Fontella Shan V 04/09/2013 7:12 AM

## 2013-04-12 ENCOUNTER — Encounter (HOSPITAL_COMMUNITY): Payer: Self-pay | Admitting: *Deleted

## 2013-04-12 ENCOUNTER — Inpatient Hospital Stay (HOSPITAL_COMMUNITY)
Admission: AD | Admit: 2013-04-12 | Discharge: 2013-04-13 | Disposition: A | Discharge status: Home / Self Care | Payer: Medicaid Other | Source: Ambulatory Visit | Attending: Obstetrics & Gynecology | Admitting: Obstetrics & Gynecology

## 2013-04-12 ENCOUNTER — Inpatient Hospital Stay (HOSPITAL_COMMUNITY): Payer: Medicaid Other

## 2013-04-12 DIAGNOSIS — R109 Unspecified abdominal pain: Secondary | ICD-10-CM | POA: Insufficient documentation

## 2013-04-12 DIAGNOSIS — O30009 Twin pregnancy, unspecified number of placenta and unspecified number of amniotic sacs, unspecified trimester: Secondary | ICD-10-CM | POA: Insufficient documentation

## 2013-04-12 DIAGNOSIS — O2390 Unspecified genitourinary tract infection in pregnancy, unspecified trimester: Secondary | ICD-10-CM | POA: Insufficient documentation

## 2013-04-12 DIAGNOSIS — B3731 Acute candidiasis of vulva and vagina: Secondary | ICD-10-CM | POA: Insufficient documentation

## 2013-04-12 DIAGNOSIS — B373 Candidiasis of vulva and vagina: Secondary | ICD-10-CM | POA: Insufficient documentation

## 2013-04-12 DIAGNOSIS — B379 Candidiasis, unspecified: Secondary | ICD-10-CM

## 2013-04-12 DIAGNOSIS — O30001 Twin pregnancy, unspecified number of placenta and unspecified number of amniotic sacs, first trimester: Secondary | ICD-10-CM

## 2013-04-12 LAB — HCG, QUANTITATIVE, PREGNANCY: hCG, Beta Chain, Quant, S: 67543 m[IU]/mL — ABNORMAL HIGH (ref <5)

## 2013-04-12 LAB — CBC
MCH: 26 pg (ref 26.0–34.0)
MCHC: 31.9 g/dL (ref 30.0–36.0)
Platelets: 422 10*3/uL — ABNORMAL HIGH (ref 150–400)
RDW: 17.6 % — ABNORMAL HIGH (ref 11.5–15.5)

## 2013-04-12 LAB — WET PREP, GENITAL

## 2013-04-12 MED ORDER — FLUCONAZOLE 150 MG PO TABS
150.0000 mg | ORAL_TABLET | Freq: Once | ORAL | Status: AC
  Administered 2013-04-12: 150 mg via ORAL
  Filled 2013-04-12: qty 1

## 2013-04-12 NOTE — MAU Provider Note (Signed)
History     CSN: [id number]  Arrival date and time: 04/12/13 2047   First Provider Initiated Contact with Patient 04/12/13 2249      No chief complaint on file.  HPI  Mary Roberts is a 32 y.o. [id number] at [redacted]w[redacted]d who presents today with cramping. She states that she has also recently completed taking a course of flagyl and now she thinks that she has a yeast infection. She is planning on going to the clinic for [facility]. She has a history of hypertension. She states that she has not been taking her medicine everyday. She is trying to used to taking a medicine everyday.   Past Medical History  Diagnosis Date  . Seizures     No meds. currently (03/26/2013)  . Hypertension   . Headache(784.0)     migraines  . Anxiety   . Chlamydia     Past Surgical History  Procedure Laterality Date  . Cesarean section    . Mouth surgery      Family History  Problem Relation Age of Onset  . Seizures Mother   . Hypertension Mother   . Diabetes Mother   . Seizures Brother     History  Substance Use Topics  . Smoking status: Former Smoker    Types: Cigarettes  . Smokeless tobacco: Never Used  . Alcohol Use: No     Comment: socially     Allergies:  Allergies  Allergen Reactions  . Amoxicillin Hives    Prescriptions prior to admission  Medication Sig Dispense Refill  . aspirin-acetaminophen-caffeine (EXCEDRIN MIGRAINE) 250-250-65 MG per tablet Take 1 tablet by mouth every 6 (six) hours as needed for pain.      Marland Kitchen labetalol (NORMODYNE) 100 MG tablet Take 2 tablets (200 mg total) by mouth 2 (two) times daily.  60 tablet  1  . metroNIDAZOLE (FLAGYL) 500 MG tablet Take 1 tablet (500 mg total) by mouth 2 (two) times daily.  14 tablet  0  . acetaminophen (TYLENOL) 325 MG tablet Take 2 tablets (650 mg total) by mouth every 6 (six) hours as needed for pain.  30 tablet  0    ROS Physical Exam   Blood pressure 157/89, pulse 78, temperature 98.9 F (37.2 C), temperature source Oral, resp.  rate 20, height 5\' 1"  (1.549 m), weight 80.287 kg (177 lb), last menstrual period 02/19/2013.  Physical Exam  MAU Course  Procedures  Results for orders placed during the hospital encounter of 04/12/13 (from the past 24 hour(s))  CBC     Status: Abnormal   Collection Time    04/12/13  9:40 PM      Result Value Range   WBC 9.4  4.0 - 10.5 K/uL   RBC 3.38 (*) 3.87 - 5.11 MIL/uL   Hemoglobin 8.8 (*) 12.0 - 15.0 g/dL   HCT 14.7 (*) 82.9 - 56.2 %   MCV 81.7  78.0 - 100.0 fL   MCH 26.0  26.0 - 34.0 pg   MCHC 31.9  30.0 - 36.0 g/dL   RDW 13.0 (*) 86.5 - 78.4 %   Platelets 422 (*) 150 - 400 K/uL  HCG, QUANTITATIVE, PREGNANCY     Status: Abnormal   Collection Time    04/12/13  9:40 PM      Result Value Range   hCG, Beta Chain, Quant, S 69629 (*) <5 mIU/mL  WET PREP, GENITAL     Status: Abnormal   Collection Time    04/12/13 11:00 PM  Result Value Range   Yeast Wet Prep HPF POC FEW (*) NONE SEEN   Trich, Wet Prep NONE SEEN  NONE SEEN   Clue Cells Wet Prep HPF POC NONE SEEN  NONE SEEN   WBC, Wet Prep HPF POC FEW (*) NONE SEEN  ; US Ob Comp Less 14 Wks  04/12/2013   CLINICAL DATA:  Urinary tract infections. Pain. Cramping.  EXAM: TWIN OBSTETRIC <14WK [place] AND TRANSVAGINAL OB US  COMPARISON:  03/31/2013  FINDINGS: Two discrete intrauterine gestational sacs are demonstrated consistent with twin pregnancy. There was limited visualization of the 2nd sac on the previous study.  TWIN 1  Intrauterine gestational sac: Visualized/normal in shape.  Yolk sac:  Visualized.  Embryo:  Fetal pole is visualized.  Cardiac Activity: Visualized.  Heart Rate: 148 bpm  CRL:  12.8  mm   7 w 4 d                  [place] EDC: 11/25/2013  TWIN 2  Intrauterine gestational sac: Visualized/normal in shape.  Yolk sac:  Visualized.  Embryo:  Not visualized.  Cardiac Activity: Not identified.  MSD: 15  mm   6 w   2  d  Maternal uterus/adnexae: A small subchorionic hemorrhage is identified. The ovaries appear symmetrical. No  abnormal adnexal masses are identified. No free pelvic fluid collections.  IMPRESSION: Twin pregnancy is identified. Twin 1 demonstrates fetal pole with fetal cardiac activity. Estimated gestational age based on crown-rump length is 7 weeks 4 days, representing appropriate interval growth since the previous study.  Twenty-two demonstrates gestational sac with yolk sac. Fetal pole is not identified. Estimated gestational age based on mean sac diameter is 6 weeks 2 days.   Electronically Signed   By: Burman Nieves M.D.   On: 04/12/2013 22:43   US Ob Transvaginal  04/12/2013   CLINICAL DATA:  Urinary tract infections. Pain. Cramping.  EXAM: TWIN OBSTETRIC <14WK [place] AND TRANSVAGINAL OB US  COMPARISON:  03/31/2013  FINDINGS: Two discrete intrauterine gestational sacs are demonstrated consistent with twin pregnancy. There was limited visualization of the 2nd sac on the previous study.  TWIN 1  Intrauterine gestational sac: Visualized/normal in shape.  Yolk sac:  Visualized.  Embryo:  Fetal pole is visualized.  Cardiac Activity: Visualized.  Heart Rate: 148 bpm  CRL:  12.8  mm   7 w 4 d                  [place] EDC: 11/25/2013  TWIN 2  Intrauterine gestational sac: Visualized/normal in shape.  Yolk sac:  Visualized.  Embryo:  Not visualized.  Cardiac Activity: Not identified.  MSD: 15  mm   6 w   2  d  Maternal uterus/adnexae: A small subchorionic hemorrhage is identified. The ovaries appear symmetrical. No abnormal adnexal masses are identified. No free pelvic fluid collections.  IMPRESSION: Twin pregnancy is identified. Twin 1 demonstrates fetal pole with fetal cardiac activity. Estimated gestational age based on crown-rump length is 7 weeks 4 days, representing appropriate interval growth since the previous study.  Twenty-two demonstrates gestational sac with yolk sac. Fetal pole is not identified. Estimated gestational age based on mean sac diameter is 6 weeks 2 days.   Electronically Signed   By: Burman Nieves  M.D.   On: 04/12/2013 22:43     Assessment and Plan   1. Yeast infection   2. Twin gestation with unknown number of placentas and amniotic sacs, first trimester  Start [facility] as planned on 04/29/13 Treated with diflucan here in MAU Return to mau as needed   Tawnya Crook 04/12/2013, 11:23 PM

## 2013-04-12 NOTE — MAU Note (Signed)
PT SAYS  WHEN  SHE WAS HERE LAST  THAT SHE HAD BLOOD IN HER URINE.  SHE DOES  NOT THINK  SHE HAS VAG BLEEDING.    SAYS HAS CRAMPING-  STARTED YESTERDAY.    SAYS HAS  VAG ITCHING-  SHE WAS ON FLAGYL.    LAST SEX- 9-11.

## 2013-04-15 ENCOUNTER — Inpatient Hospital Stay (HOSPITAL_COMMUNITY): Payer: Medicaid Other

## 2013-04-15 ENCOUNTER — Encounter (HOSPITAL_COMMUNITY): Payer: Self-pay | Admitting: *Deleted

## 2013-04-15 ENCOUNTER — Inpatient Hospital Stay (HOSPITAL_COMMUNITY)
Admission: AD | Admit: 2013-04-15 | Discharge: 2013-04-16 | Disposition: A | Discharge status: Home / Self Care | Payer: Medicaid Other | Source: Ambulatory Visit | Attending: Obstetrics & Gynecology | Admitting: Obstetrics & Gynecology

## 2013-04-15 DIAGNOSIS — O10019 Pre-existing essential hypertension complicating pregnancy, unspecified trimester: Secondary | ICD-10-CM | POA: Insufficient documentation

## 2013-04-15 DIAGNOSIS — O209 Hemorrhage in early pregnancy, unspecified: Secondary | ICD-10-CM | POA: Insufficient documentation

## 2013-04-15 DIAGNOSIS — R109 Unspecified abdominal pain: Secondary | ICD-10-CM | POA: Insufficient documentation

## 2013-04-15 LAB — WET PREP, GENITAL: Clue Cells Wet Prep HPF POC: NONE SEEN

## 2013-04-15 MED ORDER — HYDRALAZINE HCL 20 MG/ML IJ SOLN
10.0000 mg | Freq: Once | INTRAMUSCULAR | Status: DC
  Filled 2013-04-15: qty 1

## 2013-04-15 MED ORDER — HYDRALAZINE HCL 20 MG/ML IJ SOLN
10.0000 mg | Freq: Once | INTRAMUSCULAR | Status: AC
  Administered 2013-04-15: 10 mg via INTRAMUSCULAR

## 2013-04-15 NOTE — MAU Provider Note (Signed)
Chief Complaint: Vaginal Bleeding and Abdominal Pain   None    SUBJECTIVE HPI: Mary Roberts is a 32 y.o. [id number] at [redacted]w[redacted]d by LMP with twins IUP on 7 week sono who presents to maternity admissions reporting onset of bright red vaginal bleeding "like a period" a few hours ago.  She has only worn one pad since onset of bleeding.  She reports cramping with the bleeding today.  She denies vaginal itching/burning, urinary symptoms, h/a, dizziness, n/v, or fever/chills.   Past Medical History  Diagnosis Date  . Seizures     No meds. currently (03/26/2013)  . Hypertension   . Headache(784.0)     migraines  . Anxiety   . Chlamydia    Past Surgical History  Procedure Laterality Date  . Cesarean section    . Mouth surgery     History   Social History  . Marital Status: Single    Spouse Name: N/A    Number of Children: N/A  . Years of Education: N/A   Occupational History  . Not on file.   Social History Main Topics  . Smoking status: Former Smoker    Types: Cigarettes  . Smokeless tobacco: Never Used  . Alcohol Use: No     Comment: socially   . Drug Use: No  . Sexual Activity: Yes   Other Topics Concern  . Not on file   Social History Narrative  . No narrative on file   No current facility-administered medications on file prior to encounter.   Current Outpatient Prescriptions on File Prior to Encounter  Medication Sig Dispense Refill  . acetaminophen (TYLENOL) 325 MG tablet Take 2 tablets (650 mg total) by mouth every 6 (six) hours as needed for pain.  30 tablet  0  . aspirin-acetaminophen-caffeine (EXCEDRIN MIGRAINE) 250-250-65 MG per tablet Take 1 tablet by mouth every 6 (six) hours as needed for pain.      Marland Kitchen labetalol (NORMODYNE) 100 MG tablet Take 2 tablets (200 mg total) by mouth 2 (two) times daily.  60 tablet  1  . metroNIDAZOLE (FLAGYL) 500 MG tablet Take 1 tablet (500 mg total) by mouth 2 (two) times daily.  14 tablet  0   Allergies  Allergen Reactions  .  Amoxicillin Hives    ROS: Pertinent items in HPI  OBJECTIVE Blood pressure 170/93, pulse 80, temperature 98.6 F (37 C), temperature source Oral, resp. rate 16, height 5\' 1"  (1.549 m), weight 79.379 kg (175 lb), last menstrual period 02/19/2013, SpO2 100.00%. GENERAL: Well-developed, well-nourished female in no acute distress.  HEENT: Normocephalic HEART: normal rate RESP: normal effort ABDOMEN: Soft, non-tender EXTREMITIES: Nontender, no edema NEURO: Alert and oriented Pelvic exam: Cervix pink, visually closed, without lesion, moderate amount dark brown blood with possible POCs--sent to pathology, vaginal walls and external genitalia normal Bimanual exam: Cervix 0/long/high, firm, anterior  Report to [place], CNM  Sharen Counter Certified Nurse-Midwife 04/15/2013  9:18 PM  US Ob Transvaginal  04/15/2013   CLINICAL DATA:  Twin pregnancy. Bleeding. Estimated gestational age by LMP is 7 weeks 6 days.  EXAM: US OB TRANSVAGINAL  COMPARISON:  04/12/2013  FINDINGS: TWIN 1  Intrauterine gestational sac: Visualized/normal in shape.  Yolk sac:  Yolk sac is visualized.  Embryo:  Fetal pole is identified.  Cardiac Activity: Fetal cardiac activity is identified.  Heart Rate: 142 bpm  CRL:  15.6  mm   8 w 0 d  [place] EDC: 11/25/2013  TWIN 2  Intrauterine gestational sac: Visualized/normal in shape.  Yolk sac:  Yolk sac is visualized.  Embryo:  Fetal pole is not visualized.  Cardiac Activity: Fetal cardiac activity is not identified.  MSD: 13.9  mm   6 w   1  d                  [place] EDC: 12/08/2013  Maternal uterus/adnexae: Small subchorionic hemorrhage is identified adjacent to the twin 2 gestational sac. No myometrial mass lesions. Ovaries are not visualized but no adnexal masses are suggested. No free pelvic fluid collections.  IMPRESSION: Twin pregnancy is again identified. Twin 1 demonstrates a fetal pole with cardiac activity. Estimated gestational age based on crown-rump  length is 8 weeks 0 days.  Twin 2 demonstrates a yolk sac but the fetal pole is not identified. Estimated gestational age based on mean sac diameter is 6 weeks 1 day. Probable early intrauterine gestational sac, but no fetal pole, or cardiac activity yet visualized. Recommend follow-up US in 14 days to confirm and assess viability. This recommendation follows SRU consensus guidelines: Diagnostic Criteria for Nonviable Pregnancy Early in the First Trimester. Malva Limes Med 2013; 308:6578-46.   Electronically Signed   By: Burman Nieves M.D.   On: 04/15/2013 22:40     Medication List    ASK your doctor about these medications       acetaminophen 325 MG tablet  Commonly known as:  TYLENOL  Take 2 tablets (650 mg total) by mouth every 6 (six) hours as needed for pain.     aspirin-acetaminophen-caffeine 250-250-65 MG per tablet  Commonly known as:  EXCEDRIN MIGRAINE  Take 1 tablet by mouth every 6 (six) hours as needed for pain.     labetalol 100 MG tablet  Commonly known as:  NORMODYNE  Take 2 tablets (200 mg total) by mouth 2 (two) times daily.     metroNIDAZOLE 500 MG tablet  Commonly known as:  FLAGYL  Take 1 tablet (500 mg total) by mouth 2 (two) times daily.        Patient Vitals for the past 24 hrs:  BP Temp Temp src Pulse Resp SpO2 Height Weight  04/15/13 2313 170/93 mmHg - - 76 - - - -  04/15/13 2311 165/102 mmHg - - 75 - - - -  04/15/13 1935 170/93 mmHg 98.6 F (37 C) Oral 80 16 100 % 5\' 1"  (1.549 m) 79.379 kg (175 lb)   2322: Per Dr. Penne Lash pt need blood sugar lower before being discharged. IM Apresoline ordered.   Repeat BP: 157/89  ASSESSMENT: 1. First trimester bleeding   2. Hypertension in pregnancy, pre-existing, first trimester    PLAN: Discharge in stable condition. Increase labetalol to 400 mg by mouth twice a day per consult with Dr. Penne Lash. Bleeding precautions. Pelvic rest x1 week. Follow-up Information   Follow up with [facility] On  04/29/2013.   Specialty:  Obstetrics and Gynecology   Contact information:   [address] [place] [address] [phone]      Follow up with [facility] [place] [facility]ERN[facility]. (As needed if symptoms worsen)    Contact information:   [address] [id number] [place] [address] [phone]       Medication List    STOP taking these medications       metroNIDAZOLE 500 MG tablet  Commonly known as:  FLAGYL      TAKE  these medications       acetaminophen 325 MG tablet  Commonly known as:  TYLENOL  Take 2 tablets (650 mg total) by mouth every 6 (six) hours as needed for pain.     aspirin-acetaminophen-caffeine 250-250-65 MG per tablet  Commonly known as:  EXCEDRIN MIGRAINE  Take 1 tablet by mouth every 6 (six) hours as needed for pain.     labetalol 100 MG tablet  Commonly known as:  NORMODYNE  Take 4 tablets (400 mg total) by mouth 2 (two) times daily.        [place], CNM 04/15/2013 10:44 PM

## 2013-04-15 NOTE — MAU Note (Signed)
Pt reports vaginal bleeding x 1 hour , states bleeding is like her period but she is [redacted] weeks pregnant. Also reports cramping that started at the same time as the bleeding

## 2013-04-15 NOTE — MAU Note (Signed)
Pt states she has some unusual vaginal bleeding and cramping that started today about 2-3 hours ago.

## 2013-04-16 LAB — GC/CHLAMYDIA PROBE AMP: GC Probe RNA: NEGATIVE

## 2013-04-16 MED ORDER — LABETALOL HCL 100 MG PO TABS: 400.0000 mg | ORAL_TABLET | Freq: Two times a day (BID) | ORAL | Status: DC

## 2013-04-19 NOTE — MAU Provider Note (Signed)
Attestation of Attending Supervision of Advanced Practitioner (CNM/NP): Evaluation and management procedures were performed by the Advanced Practitioner under my supervision and collaboration. I have reviewed the Advanced Practitioner's note and chart, and I agree with the management and plan.  Ettie Krontz H. 4:47 PM   

## 2013-04-29 ENCOUNTER — Other Ambulatory Visit (HOSPITAL_COMMUNITY)
Admission: RE | Admit: 2013-04-29 | Discharge: 2013-04-29 | Disposition: A | Discharge status: Home / Self Care | Payer: Medicaid Other | Source: Ambulatory Visit | Attending: Obstetrics & Gynecology | Admitting: Obstetrics & Gynecology

## 2013-04-29 ENCOUNTER — Encounter: Payer: Self-pay | Admitting: Obstetrics & Gynecology

## 2013-04-29 ENCOUNTER — Ambulatory Visit (INDEPENDENT_AMBULATORY_CARE_PROVIDER_SITE_OTHER): Payer: Medicaid Other | Admitting: Obstetrics & Gynecology

## 2013-04-29 VITALS — BP 164/100 | Temp 98.0°F | Wt 174.0 lb

## 2013-04-29 DIAGNOSIS — Z01419 Encounter for gynecological examination (general) (routine) without abnormal findings: Secondary | ICD-10-CM | POA: Insufficient documentation

## 2013-04-29 DIAGNOSIS — Z1151 Encounter for screening for human papillomavirus (HPV): Secondary | ICD-10-CM | POA: Insufficient documentation

## 2013-04-29 DIAGNOSIS — O0992 Supervision of high risk pregnancy, unspecified, second trimester: Secondary | ICD-10-CM | POA: Insufficient documentation

## 2013-04-29 DIAGNOSIS — O139 Gestational [pregnancy-induced] hypertension without significant proteinuria, unspecified trimester: Secondary | ICD-10-CM

## 2013-04-29 DIAGNOSIS — O10911 Unspecified pre-existing hypertension complicating pregnancy, first trimester: Secondary | ICD-10-CM

## 2013-04-29 DIAGNOSIS — IMO0002 Reserved for concepts with insufficient information to code with codable children: Secondary | ICD-10-CM | POA: Insufficient documentation

## 2013-04-29 DIAGNOSIS — O131 Gestational [pregnancy-induced] hypertension without significant proteinuria, first trimester: Secondary | ICD-10-CM

## 2013-04-29 DIAGNOSIS — Z113 Encounter for screening for infections with a predominantly sexual mode of transmission: Secondary | ICD-10-CM | POA: Insufficient documentation

## 2013-04-29 DIAGNOSIS — O30009 Twin pregnancy, unspecified number of placenta and unspecified number of amniotic sacs, unspecified trimester: Secondary | ICD-10-CM

## 2013-04-29 DIAGNOSIS — O169 Unspecified maternal hypertension, unspecified trimester: Secondary | ICD-10-CM

## 2013-04-29 DIAGNOSIS — O30001 Twin pregnancy, unspecified number of placenta and unspecified number of amniotic sacs, first trimester: Secondary | ICD-10-CM

## 2013-04-29 LAB — POCT URINALYSIS DIP (DEVICE)
Glucose, UA: NEGATIVE mg/dL
Nitrite: NEGATIVE
Protein, ur: NEGATIVE mg/dL
Specific Gravity, Urine: >=1.03 (ref 1.005–1.030)
Urobilinogen, UA: 0.2 mg/dL (ref 0.0–1.0)

## 2013-04-29 LAB — OB RESULTS CONSOLE GC/CHLAMYDIA
CHLAMYDIA, DNA PROBE: NEGATIVE
Gonorrhea: NEGATIVE

## 2013-04-29 MED ORDER — LABETALOL HCL 200 MG PO TABS: 400.0000 mg | ORAL_TABLET | Freq: Three times a day (TID) | ORAL | Status: DC

## 2013-04-29 MED ORDER — PRENATAL VITAMINS 0.8 MG PO TABS: 1.0000 | ORAL_TABLET | Freq: Every day | ORAL | Status: DC

## 2013-04-29 NOTE — Patient Instructions (Signed)
Pregnancy - First Trimester  During sexual intercourse, millions of sperm go into the vagina. Only 1 sperm will penetrate and fertilize the female egg while it is in the Fallopian tube. One week later, the fertilized egg implants into the wall of the uterus. An embryo begins to develop into a baby. At 6 to 8 weeks, the eyes and face are formed and the heartbeat can be seen on ultrasound. At the end of 12 weeks (first trimester), all the baby's organs are formed. Now that you are pregnant, you will want to do everything you can to have a healthy baby. Two of the most important things are to get good prenatal care and follow your caregiver's instructions. Prenatal care is all the medical care you receive before the baby's birth. It is given to prevent, find, and treat problems during the pregnancy and childbirth.  PRENATAL EXAMS  · During prenatal visits, your weight, blood pressure, and urine are checked. This is done to make sure you are healthy and progressing normally during the pregnancy.  · A pregnant woman should gain 25 to 35 pounds during the pregnancy. However, if you are overweight or underweight, your caregiver will advise you regarding your weight.  · Your caregiver will ask and answer questions for you.  · Blood work, cervical cultures, other necessary tests, and a Pap test are done during your prenatal exams. These tests are done to check on your health and the probable health of your baby. Tests are strongly recommended and done for HIV with your permission. This is the virus that causes AIDS. These tests are done because medicines can be given to help prevent your baby from being born with this infection should you have been infected without knowing it. Blood work is also used to find out your blood type, previous infections, and follow your blood levels (hemoglobin).  · Low hemoglobin (anemia) is common during pregnancy. Iron and vitamins are given to help prevent this. Later in the pregnancy, blood  tests for diabetes will be done along with any other tests if any problems develop.  · You may need other tests to make sure you and the baby are doing well.  CHANGES DURING THE FIRST TRIMESTER   Your body goes through many changes during pregnancy. They vary from person to person. Talk to your caregiver about changes you notice and are concerned about. Changes can include:  · Your menstrual period stops.  · The egg and sperm carry the genes that determine what you look like. Genes from you and your partner are forming a baby. The female genes determine whether the baby is a boy or a girl.  · Your body increases in girth and you may feel bloated.  · Feeling sick to your stomach (nauseous) and throwing up (vomiting). If the vomiting is uncontrollable, call your caregiver.  · Your breasts will begin to enlarge and become tender.  · Your nipples may stick out more and become darker.  · The need to urinate more. Painful urination may mean you have a bladder infection.  · Tiring easily.  · Loss of appetite.  · Cravings for certain kinds of food.  · At first, you may gain or lose a couple of pounds.  · You may have changes in your emotions from day to day (excited to be pregnant or concerned something may go wrong with the pregnancy and baby).  · You may have more vivid and strange dreams.  HOME CARE INSTRUCTIONS   ·   It is very important to avoid all smoking, alcohol and non-prescribed drugs during your pregnancy. These affect the formation and growth of the baby. Avoid chemicals while pregnant to ensure the delivery of a healthy infant.  · Start your prenatal visits by the 12th week of pregnancy. They are usually scheduled monthly at first, then more often in the last 2 months before delivery. Keep your caregiver's appointments. Follow your caregiver's instructions regarding medicine use, blood and lab tests, exercise, and diet.  · During pregnancy, you are providing food for you and your baby. Eat regular, well-balanced  meals. Choose foods such as meat, fish, milk and other low fat dairy products, vegetables, fruits, and whole-grain breads and cereals. Your caregiver will tell you of the ideal weight gain.  · You can help morning sickness by keeping soda crackers at the bedside. Eat a couple before arising in the morning. You may want to use the crackers without salt on them.  · Eating 4 to 5 small meals rather than 3 large meals a day also may help the nausea and vomiting.  · Drinking liquids between meals instead of during meals also seems to help nausea and vomiting.  · A physical sexual relationship may be continued throughout pregnancy if there are no other problems. Problems may be early (premature) leaking of amniotic fluid from the membranes, vaginal bleeding, or belly (abdominal) pain.  · Exercise regularly if there are no restrictions. Check with your caregiver or physical therapist if you are unsure of the safety of some of your exercises. Greater weight gain will occur in the last 2 trimesters of pregnancy. Exercising will help:  · Control your weight.  · Keep you in shape.  · Prepare you for labor and delivery.  · Help you lose your pregnancy weight after you deliver your baby.  · Wear a good support or jogging bra for breast tenderness during pregnancy. This may help if worn during sleep too.  · Ask when prenatal classes are available. Begin classes when they are offered.  · Do not use hot tubs, steam rooms, or saunas.  · Wear your seat belt when driving. This protects you and your baby if you are in an accident.  · Avoid raw meat, uncooked cheese, cat litter boxes, and soil used by cats throughout the pregnancy. These carry germs that can cause birth defects in the baby.  · The first trimester is a good time to visit your dentist for your dental health. Getting your teeth cleaned is okay. Use a softer toothbrush and brush gently during pregnancy.  · Ask for help if you have financial, counseling, or nutritional needs  during pregnancy. Your caregiver will be able to offer counseling for these needs as well as refer you for other special needs.  · Do not take any medicines or herbs unless told by your caregiver.  · Inform your caregiver if there is any mental or physical domestic violence.  · Make a list of emergency phone numbers of family, friends, hospital, and police and fire departments.  · Write down your questions. Take them to your prenatal visit.  · Do not douche.  · Do not cross your legs.  · If you have to stand for long periods of time, rotate you feet or take small steps in a circle.  · You may have more vaginal secretions that may require a sanitary pad. Do not use tampons or scented sanitary pads.  MEDICINES AND DRUG USE IN PREGNANCY  ·   Take prenatal vitamins as directed. The vitamin should contain 1 milligram of folic acid. Keep all vitamins out of reach of children. Only a couple vitamins or tablets containing iron may be fatal to a baby or young child when ingested.  · Avoid use of all medicines, including herbs, over-the-counter medicines, not prescribed or suggested by your caregiver. Only take over-the-counter or prescription medicines for pain, discomfort, or fever as directed by your caregiver. Do not use aspirin, ibuprofen, or naproxen unless directed by your caregiver.  · Let your caregiver also know about herbs you may be using.  · Alcohol is related to a number of birth defects. This includes fetal alcohol syndrome. All alcohol, in any form, should be avoided completely. Smoking will cause low birth rate and premature babies.  · Street or illegal drugs are very harmful to the baby. They are absolutely forbidden. A baby born to an addicted mother will be addicted at birth. The baby will go through the same withdrawal an adult does.  · Let your caregiver know about any medicines that you have to take and for what reason you take them.  SEEK MEDICAL CARE IF:   You have any concerns or worries during your  pregnancy. It is better to call with your questions if you feel they cannot wait, rather than worry about them.  SEEK IMMEDIATE MEDICAL CARE IF:   · An unexplained oral temperature above 102° F (38.9° C) develops, or as your caregiver suggests.  · You have leaking of fluid from the vagina (birth canal). If leaking membranes are suspected, take your temperature and inform your caregiver of this when you call.  · There is vaginal spotting or bleeding. Notify your caregiver of the amount and how many pads are used.  · You develop a bad smelling vaginal discharge with a change in the color.  · You continue to feel sick to your stomach (nauseated) and have no relief from remedies suggested. You vomit blood or coffee ground-like materials.  · You lose more than 2 pounds of weight in 1 week.  · You gain more than 2 pounds of weight in 1 week and you notice swelling of your face, hands, feet, or legs.  · You gain 5 pounds or more in 1 week (even if you do not have swelling of your hands, face, legs, or feet).  · You get exposed to German measles and have never had them.  · You are exposed to fifth disease or chickenpox.  · You develop belly (abdominal) pain. Round ligament discomfort is a common non-cancerous (benign) cause of abdominal pain in pregnancy. Your caregiver still must evaluate this.  · You develop headache, fever, diarrhea, pain with urination, or shortness of breath.  · You fall or are in a car accident or have any kind of trauma.  · There is mental or physical violence in your home.  Document Released: 06/04/2001 Document Revised: 03/04/2012 Document Reviewed: 12/06/2008  [employer]® Patient Information ©2014 [employer].

## 2013-04-29 NOTE — Progress Notes (Signed)
U/S scheduled 04/30/13 at at 1 pm. Work note given by front office.

## 2013-04-29 NOTE — Progress Notes (Signed)
Pulse: 76 Has mucous discharge with itching.  Early 1 hour due to family history and BMI greater than 30.

## 2013-04-29 NOTE — Progress Notes (Signed)
Nutrition note: 1st visit consult Pt has h/o obesity. Pt has lost 1# so far. Pt reports eating 3 meals & 1 snack/d. Pt reports some nausea but no vomiting or heartburn. Pt stated she gets full fast as well. Pt is taking PNV. Pt received verbal & written education on general nutrition during pregnancy. Encouraged pt to include a protein with all meals & snacks and provided a handout on small frequent meals. Discussed wt gain goals of 11-20# or 0.5#/wk in 2nd & 3rd trimester. Pt agrees to continue taking PNV and try to include protein each time she eats. Pt does not have WIC but plans to apply. Pt plans to BF. F/u in 4-6 wks Blondell Reveal, MS, RD, LDN, [facility]

## 2013-04-30 ENCOUNTER — Ambulatory Visit (HOSPITAL_COMMUNITY): Payer: Medicaid Other

## 2013-04-30 LAB — GLUCOSE TOLERANCE, 1 HOUR (50G) W/O FASTING: Glucose, 1 Hour GTT: 77 mg/dL (ref 70–140)

## 2013-04-30 LAB — OBSTETRIC PANEL
Basophils Absolute: 0 10*3/uL (ref 0.0–0.1)
Basophils Relative: 0 % (ref 0–1)
Eosinophils Absolute: 0.1 10*3/uL (ref 0.0–0.7)
Hepatitis B Surface Ag: NEGATIVE
Lymphs Abs: 0.8 10*3/uL (ref 0.7–4.0)
MCH: 26.3 pg (ref 26.0–34.0)
MCHC: 32.8 g/dL (ref 30.0–36.0)
Monocytes Relative: 9 % (ref 3–12)
Neutrophils Relative %: 76 % (ref 43–77)
Platelets: 234 10*3/uL (ref 150–400)
RBC: 3.61 MIL/uL — ABNORMAL LOW (ref 3.87–5.11)
RDW: 19.4 % — ABNORMAL HIGH (ref 11.5–15.5)

## 2013-04-30 LAB — GC/CHLAMYDIA PROBE AMP
CT Probe RNA: NEGATIVE
GC Probe RNA: NEGATIVE

## 2013-05-01 LAB — PRESCRIPTION MONITORING PROFILE (19 PANEL)
Amphetamine/Meth: NEGATIVE ng/mL
Barbiturate Screen, Urine: NEGATIVE ng/mL
Buprenorphine, Urine: NEGATIVE ng/mL
Carisoprodol, Urine: NEGATIVE ng/mL
Creatinine, Urine: 180.78 mg/dL (ref >20.0)
MDMA URINE: NEGATIVE ng/mL
Meperidine, Ur: NEGATIVE ng/mL
Methadone Screen, Urine: NEGATIVE ng/mL
Nitrites, Initial: NEGATIVE ug/mL
Opiate Screen, Urine: NEGATIVE ng/mL
Oxycodone Screen, Ur: NEGATIVE ng/mL
Propoxyphene: NEGATIVE ng/mL
Tapentadol, urine: NEGATIVE ng/mL
pH, Initial: 6.2 pH (ref 4.5–8.9)

## 2013-05-04 ENCOUNTER — Inpatient Hospital Stay (HOSPITAL_COMMUNITY): Payer: Medicaid Other

## 2013-05-04 ENCOUNTER — Inpatient Hospital Stay (HOSPITAL_COMMUNITY)
Admission: AD | Admit: 2013-05-04 | Discharge: 2013-05-04 | Disposition: A | Discharge status: Home / Self Care | Payer: Medicaid Other | Source: Ambulatory Visit | Attending: Obstetrics & Gynecology | Admitting: Obstetrics & Gynecology

## 2013-05-04 ENCOUNTER — Encounter (HOSPITAL_COMMUNITY): Payer: Self-pay | Admitting: *Deleted

## 2013-05-04 DIAGNOSIS — B3731 Acute candidiasis of vulva and vagina: Secondary | ICD-10-CM | POA: Insufficient documentation

## 2013-05-04 DIAGNOSIS — R109 Unspecified abdominal pain: Secondary | ICD-10-CM | POA: Insufficient documentation

## 2013-05-04 DIAGNOSIS — B373 Candidiasis of vulva and vagina: Secondary | ICD-10-CM

## 2013-05-04 DIAGNOSIS — IMO0002 Reserved for concepts with insufficient information to code with codable children: Secondary | ICD-10-CM | POA: Insufficient documentation

## 2013-05-04 DIAGNOSIS — L293 Anogenital pruritus, unspecified: Secondary | ICD-10-CM | POA: Insufficient documentation

## 2013-05-04 DIAGNOSIS — O30001 Twin pregnancy, unspecified number of placenta and unspecified number of amniotic sacs, first trimester: Secondary | ICD-10-CM

## 2013-05-04 DIAGNOSIS — O2390 Unspecified genitourinary tract infection in pregnancy, unspecified trimester: Secondary | ICD-10-CM | POA: Insufficient documentation

## 2013-05-04 DIAGNOSIS — Z3490 Encounter for supervision of normal pregnancy, unspecified, unspecified trimester: Secondary | ICD-10-CM

## 2013-05-04 DIAGNOSIS — O30009 Twin pregnancy, unspecified number of placenta and unspecified number of amniotic sacs, unspecified trimester: Secondary | ICD-10-CM | POA: Insufficient documentation

## 2013-05-04 LAB — URINALYSIS, ROUTINE W REFLEX MICROSCOPIC
Bilirubin Urine: NEGATIVE
Ketones, ur: NEGATIVE mg/dL
Nitrite: NEGATIVE
Urobilinogen, UA: 0.2 mg/dL (ref 0.0–1.0)
pH: 6.5 (ref 5.0–8.0)

## 2013-05-04 LAB — URINE MICROSCOPIC-ADD ON

## 2013-05-04 LAB — WET PREP, GENITAL: Trich, Wet Prep: NONE SEEN

## 2013-05-04 MED ORDER — FLUCONAZOLE 150 MG PO TABS: ORAL_TABLET | ORAL | Status: DC

## 2013-05-04 NOTE — MAU Provider Note (Signed)
Attestation of Attending Supervision of Advanced Practitioner (PA/CNM/NP): Evaluation and management procedures were performed by the Advanced Practitioner under my supervision and collaboration.  I have reviewed the Advanced Practitioner's note and chart, and I agree with the management and plan.  Samiel Peel, MD, FACOG Attending Obstetrician & Gynecologist Faculty Practice, [facility]  

## 2013-05-04 NOTE — MAU Note (Signed)
Patient presents with complaints of vaginal itching X 1 week; vaginal bleeding today followed by abdominal cramping.

## 2013-05-04 NOTE — MAU Provider Note (Signed)
Chief Complaint: Vaginal Bleeding, Vaginal Itching and Abdominal Cramping   First Provider Initiated Contact with Patient 05/04/13 1321     SUBJECTIVE HPI: Mary Roberts is a 32 y.o. [id number] at [redacted]w[redacted]d by LMP pt of [facility] who presents to maternity admissions reporting pink bleeding note in toilet after urinating this morning and cramping x2 days.  This is a twin pregnancy and Baby A had cardiac activity but Baby B did not and measured 2 weeks less.  She is also still having vaginal itching following Diflucan x1 prescribed recently.  She denies urinary symptoms, h/a, dizziness, n/v, or fever/chills.     Past Medical History  Diagnosis Date  . Seizures     No meds. currently (03/26/2013)  . Hypertension   . Headache(784.0)     migraines  . Anxiety   . Chlamydia    Past Surgical History  Procedure Laterality Date  . Cesarean section    . Mouth surgery     History   Social History  . Marital Status: Single    Spouse Name: N/A    Number of Children: N/A  . Years of Education: N/A   Occupational History  . Not on file.   Social History Main Topics  . Smoking status: Former Smoker    Types: Cigarettes  . Smokeless tobacco: Never Used  . Alcohol Use: No     Comment: socially   . Drug Use: No  . Sexual Activity: Yes    Birth Control/ Protection: None   Other Topics Concern  . Not on file   Social History Narrative  . No narrative on file   No current facility-administered medications on file prior to encounter.   Current Outpatient Prescriptions on File Prior to Encounter  Medication Sig Dispense Refill  . labetalol (NORMODYNE) 200 MG tablet Take 2 tablets (400 mg total) by mouth 3 (three) times daily.  60 tablet  1   Allergies  Allergen Reactions  . Amoxicillin Hives    ROS: Pertinent items in HPI  OBJECTIVE Blood pressure 144/87, pulse 80, temperature 98.6 F (37 C), temperature source Oral, resp. rate 18, height 5' 2.5" (1.588 m), weight 80.105 kg (176 lb 9.6  oz), last menstrual period 02/19/2013. GENERAL: Well-developed, well-nourished female in no acute distress.  HEENT: Normocephalic HEART: normal rate RESP: normal effort ABDOMEN: Soft, non-tender EXTREMITIES: Nontender, no edema NEURO: Alert and oriented Pelvic exam: Cervix pink, visually closed, without lesion, moderate amount thick white clinging discharge, scant brown discharge in vault, vaginal walls with mild erythema, and external genitalia normal  LAB RESULTS Results for orders placed during the hospital encounter of 05/04/13 (from the past 24 hour(s))  URINALYSIS, ROUTINE W REFLEX MICROSCOPIC     Status: Abnormal   Collection Time    05/04/13 12:20 PM      Result Value Range   Color, Urine YELLOW  YELLOW   APPearance CLEAR  CLEAR   Specific Gravity, Urine >1.030 (*) 1.005 - 1.030   pH 6.5  5.0 - 8.0   Glucose, UA NEGATIVE  NEGATIVE mg/dL   Hgb urine dipstick SMALL (*) NEGATIVE   Bilirubin Urine NEGATIVE  NEGATIVE   Ketones, ur NEGATIVE  NEGATIVE mg/dL   Protein, ur 30 (*) NEGATIVE mg/dL   Urobilinogen, UA 0.2  0.0 - 1.0 mg/dL   Nitrite NEGATIVE  NEGATIVE   Leukocytes, UA TRACE (*) NEGATIVE  URINE MICROSCOPIC-ADD ON     Status: Abnormal   Collection Time    05/04/13 12:20 PM  Result Value Range   Squamous Epithelial / LPF FEW (*) RARE   WBC, UA 0-2  <3 WBC/hpf   RBC / HPF 3-6  <3 RBC/hpf   Bacteria, UA FEW (*) RARE    IMAGING   US Ob Transvaginal  04/15/2013   CLINICAL DATA:  Twin pregnancy. Bleeding. Estimated gestational age by LMP is 7 weeks 6 days.  EXAM: US OB TRANSVAGINAL  COMPARISON:  04/12/2013  FINDINGS: TWIN 1  Intrauterine gestational sac: Visualized/normal in shape.  Yolk sac:  Yolk sac is visualized.  Embryo:  Fetal pole is identified.  Cardiac Activity: Fetal cardiac activity is identified.  Heart Rate: 142 bpm  CRL:  15.6  mm   8 w 0 d                  [place] EDC: 11/25/2013  TWIN 2  Intrauterine gestational sac: Visualized/normal in shape.  Yolk sac:   Yolk sac is visualized.  Embryo:  Fetal pole is not visualized.  Cardiac Activity: Fetal cardiac activity is not identified.  MSD: 13.9  mm   6 w   1  d                  [place] EDC: 12/08/2013  Maternal uterus/adnexae: Small subchorionic hemorrhage is identified adjacent to the twin 2 gestational sac. No myometrial mass lesions. Ovaries are not visualized but no adnexal masses are suggested. No free pelvic fluid collections.  IMPRESSION: Twin pregnancy is again identified. Twin 1 demonstrates a fetal pole with cardiac activity. Estimated gestational age based on crown-rump length is 8 weeks 0 days.  Twin 2 demonstrates a yolk sac but the fetal pole is not identified. Estimated gestational age based on mean sac diameter is 6 weeks 1 day. Probable early intrauterine gestational sac, but no fetal pole, or cardiac activity yet visualized. Recommend follow-up US in 14 days to confirm and assess viability. This recommendation follows SRU consensus guidelines: Diagnostic Criteria for Nonviable Pregnancy Early in the First Trimester. Malva Limes Med 2013; 161:0960-45.   Electronically Signed   By: Burman Nieves M.D.   On: 04/15/2013 22:40   US Ob Comp Less 14 Wks  05/04/2013   CLINICAL DATA:  Followup twin pregnancy  EXAM: TWIN OBSTETRIC <14WK [place] AND TRANSVAGINAL OB US  COMPARISON:  04/15/2013  FINDINGS: TWIN A  Intrauterine gestational sac: Visualized/normal in shape.  Yolk sac:  Not visualized  Embryo:  Visualized  Cardiac Activity: Visualized  Heart Rate: 167 bpm  CRL:  43  mm   11 w 1d                  [place] EDC: 11/21/13  TWIN B  Intrauterine gestational sac: Visualized, irregular  Yolk sac:  Not visualized  Embryo:  Not visualized  Cardiac Activity: Not visualized  MSD: 21  mm   7 w   1  d  [place] EDC: 12/20/13  Maternal uterus/adnexae: The ovaries are normal. No free fluid.  IMPRESSION: Appropriate interval growth of twin A.  Definitive failure of twin B intrauterine pregnancy, with interval disappearance of the yolk sac  previously seen, and no interval development of a fetal pole.   Electronically Signed   By: Christiana Pellant M.D.   On: 05/04/2013 15:33   US Ob Comp Addl Gest Less 14 Wks  05/04/2013   CLINICAL DATA:  Followup twin pregnancy  EXAM: TWIN OBSTETRIC <14WK [place] AND TRANSVAGINAL OB US  COMPARISON:  04/15/2013  FINDINGS: TWIN  A  Intrauterine gestational sac: Visualized/normal in shape.  Yolk sac:  Not visualized  Embryo:  Visualized  Cardiac Activity: Visualized  Heart Rate: 167 bpm  CRL:  43  mm   11 w 1d                  [place] EDC: 11/21/13  TWIN B  Intrauterine gestational sac: Visualized, irregular  Yolk sac:  Not visualized  Embryo:  Not visualized  Cardiac Activity: Not visualized  MSD: 21  mm   7 w   1  d  [place] EDC: 12/20/13  Maternal uterus/adnexae: The ovaries are normal. No free fluid.  IMPRESSION: Appropriate interval growth of twin A.  Definitive failure of twin B intrauterine pregnancy, with interval disappearance of the yolk sac previously seen, and no interval development of a fetal pole.   Electronically Signed   By: Christiana Pellant M.D.   On: 05/04/2013 15:33   US Ob Transvaginal  05/04/2013   CLINICAL DATA:  Followup twin pregnancy  EXAM: TWIN OBSTETRIC <14WK [place] AND TRANSVAGINAL OB US  COMPARISON:  04/15/2013  FINDINGS: TWIN A  Intrauterine gestational sac: Visualized/normal in shape.  Yolk sac:  Not visualized  Embryo:  Visualized  Cardiac Activity: Visualized  Heart Rate: 167 bpm  CRL:  43  mm   11 w 1d                  [place] EDC: 11/21/13  TWIN B  Intrauterine gestational sac: Visualized, irregular  Yolk sac:  Not visualized  Embryo:  Not visualized  Cardiac Activity: Not visualized  MSD: 21  mm   7 w   1  d  [place] EDC: 12/20/13  Maternal uterus/adnexae: The ovaries are normal. No free fluid.  IMPRESSION: Appropriate interval growth of twin A.  Definitive failure of twin B intrauterine pregnancy, with interval disappearance of the yolk sac previously seen, and no interval development of a fetal pole.    Electronically Signed   By: Christiana Pellant M.D.   On: 05/04/2013 15:33     ASSESSMENT 1. Twin pregnancy in first trimester --definitive failed pregnancy Twin B  2. Normal IUP (intrauterine pregnancy) on prenatal ultrasound with Twin A  3. Vaginal candidiasis     PLAN Discharge home Diflucan 150 mg x1 dose with 1 refill F/U in clinic as planned Return to MAU as needed    Medication List    ASK your doctor about these medications       labetalol 200 MG tablet  Commonly known as:  NORMODYNE  Take 2 tablets (400 mg total) by mouth 3 (three) times daily.     prenatal multivitamin Tabs tablet  Take 1 tablet by mouth daily at 12 noon.         Sharen Counter Certified Nurse-Midwife 05/04/2013  1:42 PM

## 2013-05-05 ENCOUNTER — Ambulatory Visit (HOSPITAL_COMMUNITY): Payer: Medicaid Other

## 2013-05-05 LAB — URINE CULTURE

## 2013-05-07 ENCOUNTER — Encounter: Payer: Self-pay | Admitting: *Deleted

## 2013-05-22 ENCOUNTER — Other Ambulatory Visit: Payer: Self-pay | Admitting: Obstetrics & Gynecology

## 2013-05-27 ENCOUNTER — Ambulatory Visit (INDEPENDENT_AMBULATORY_CARE_PROVIDER_SITE_OTHER): Payer: Medicaid Other | Admitting: Family

## 2013-05-27 ENCOUNTER — Encounter: Payer: Self-pay | Admitting: Family

## 2013-05-27 VITALS — BP 150/93 | Temp 98.6°F | Wt 180.5 lb

## 2013-05-27 DIAGNOSIS — O30001 Twin pregnancy, unspecified number of placenta and unspecified number of amniotic sacs, first trimester: Secondary | ICD-10-CM

## 2013-05-27 DIAGNOSIS — O10911 Unspecified pre-existing hypertension complicating pregnancy, first trimester: Secondary | ICD-10-CM

## 2013-05-27 DIAGNOSIS — O30009 Twin pregnancy, unspecified number of placenta and unspecified number of amniotic sacs, unspecified trimester: Secondary | ICD-10-CM

## 2013-05-27 DIAGNOSIS — O169 Unspecified maternal hypertension, unspecified trimester: Secondary | ICD-10-CM

## 2013-05-27 LAB — POCT URINALYSIS DIP (DEVICE)
Glucose, UA: NEGATIVE mg/dL
Ketones, ur: NEGATIVE mg/dL
Specific Gravity, Urine: 1.025 (ref 1.005–1.030)
Urobilinogen, UA: 0.2 mg/dL (ref 0.0–1.0)

## 2013-05-27 NOTE — Progress Notes (Signed)
Pulse: 81

## 2013-05-27 NOTE — Progress Notes (Signed)
No questions or concerns.  Desires quad > will obtain at next visit.  Need to repeat 24 hr urine, protein was not collected > will obtain at next visit.

## 2013-05-31 ENCOUNTER — Telehealth: Payer: Self-pay | Admitting: *Deleted

## 2013-05-31 NOTE — Telephone Encounter (Signed)
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  Message left for her to return call with coworker.

## 2013-05-31 NOTE — Telephone Encounter (Signed)
Pt called the front desk and asked if she could get her [place] scheduled cause she is [redacted] weeks pregnant and wants to know the sex of the baby.  I advised pt that the best time for scheduling an [place] for gender is between 18-20 weeks. So at her next appt she will be 16 weeks in which at that appt she will get her [place] scheduled 2-3 weeks out from that day.  Pt stated understanding with no further questions.

## 2013-05-31 NOTE — Telephone Encounter (Signed)
Pt called nurse line requesting to speak with someone concerning scheduling ultrasound appt with next prenatal visit on 06/15/2013. Pt request phone call to her work [phone] and inform the person who answers the phone that this is her physician.

## 2013-06-07 ENCOUNTER — Other Ambulatory Visit: Payer: Self-pay | Admitting: Obstetrics & Gynecology

## 2013-06-15 ENCOUNTER — Ambulatory Visit (INDEPENDENT_AMBULATORY_CARE_PROVIDER_SITE_OTHER): Payer: Medicaid Other | Admitting: Obstetrics & Gynecology

## 2013-06-15 VITALS — BP 129/87

## 2013-06-15 DIAGNOSIS — O10912 Unspecified pre-existing hypertension complicating pregnancy, second trimester: Secondary | ICD-10-CM

## 2013-06-15 DIAGNOSIS — O169 Unspecified maternal hypertension, unspecified trimester: Secondary | ICD-10-CM

## 2013-06-15 LAB — POCT URINALYSIS DIP (DEVICE)
Bilirubin Urine: NEGATIVE
Glucose, UA: NEGATIVE mg/dL
Nitrite: NEGATIVE
Specific Gravity, Urine: >=1.03 (ref 1.005–1.030)
pH: 6 (ref 5.0–8.0)

## 2013-06-15 MED ORDER — LABETALOL HCL 200 MG PO TABS: 400.0000 mg | ORAL_TABLET | Freq: Three times a day (TID) | ORAL | Status: DC

## 2013-06-15 NOTE — Patient Instructions (Signed)

## 2013-06-15 NOTE — Progress Notes (Signed)
Some heartburn nand nausea, rx protonix,  Continue Labetalol TID. US anatomy in 2-3 weeks

## 2013-06-15 NOTE — Progress Notes (Signed)
P= 83 Edema feet.  States she felt a strong kick in her abdomen and feels it every once in a while; states "it be really hard." Explained it could be baby moving.

## 2013-06-21 ENCOUNTER — Other Ambulatory Visit: Payer: Self-pay

## 2013-06-21 ENCOUNTER — Encounter (HOSPITAL_COMMUNITY): Payer: Self-pay | Admitting: *Deleted

## 2013-06-21 ENCOUNTER — Inpatient Hospital Stay (HOSPITAL_COMMUNITY)
Admission: AD | Admit: 2013-06-21 | Discharge: 2013-06-22 | Disposition: A | Discharge status: Home / Self Care | Payer: Medicaid Other | Source: Ambulatory Visit | Attending: Emergency Medicine | Admitting: Emergency Medicine

## 2013-06-21 DIAGNOSIS — E876 Hypokalemia: Secondary | ICD-10-CM

## 2013-06-21 DIAGNOSIS — O99891 Other specified diseases and conditions complicating pregnancy: Secondary | ICD-10-CM | POA: Insufficient documentation

## 2013-06-21 DIAGNOSIS — R109 Unspecified abdominal pain: Secondary | ICD-10-CM | POA: Insufficient documentation

## 2013-06-21 DIAGNOSIS — R079 Chest pain, unspecified: Secondary | ICD-10-CM | POA: Insufficient documentation

## 2013-06-21 DIAGNOSIS — Z3492 Encounter for supervision of normal pregnancy, unspecified, second trimester: Secondary | ICD-10-CM

## 2013-06-21 LAB — URINALYSIS, ROUTINE W REFLEX MICROSCOPIC
Bilirubin Urine: NEGATIVE
Glucose, UA: NEGATIVE mg/dL
Protein, ur: NEGATIVE mg/dL
Urobilinogen, UA: 0.2 mg/dL (ref 0.0–1.0)
pH: 7.5 (ref 5.0–8.0)

## 2013-06-21 LAB — COMPREHENSIVE METABOLIC PANEL
Albumin: 2.9 g/dL — ABNORMAL LOW (ref 3.5–5.2)
BUN: 9 mg/dL (ref 6–23)
CO2: 24 mEq/L (ref 19–32)
Calcium: 9.2 mg/dL (ref 8.4–10.5)
Chloride: 101 mEq/L (ref 96–112)
Creatinine, Ser: 0.59 mg/dL (ref 0.50–1.10)
GFR calc non Af Amer: >90 mL/min (ref >90)
Glucose, Bld: 81 mg/dL (ref 70–99)
Potassium: 3.2 mEq/L — ABNORMAL LOW (ref 3.5–5.1)
Total Bilirubin: <0.1 mg/dL — ABNORMAL LOW (ref 0.3–1.2)

## 2013-06-21 LAB — CBC
HCT: 26.9 % — ABNORMAL LOW (ref 36.0–46.0)
Hemoglobin: 9 g/dL — ABNORMAL LOW (ref 12.0–15.0)
MCH: 28.3 pg (ref 26.0–34.0)
MCV: 84.6 fL (ref 78.0–100.0)
RBC: 3.18 MIL/uL — ABNORMAL LOW (ref 3.87–5.11)
RDW: 16.7 % — ABNORMAL HIGH (ref 11.5–15.5)
WBC: 9.4 10*3/uL (ref 4.0–10.5)

## 2013-06-21 LAB — URINE MICROSCOPIC-ADD ON

## 2013-06-21 LAB — POCT I-STAT TROPONIN I: Troponin i, poc: 0.01 ng/mL (ref 0.00–0.08)

## 2013-06-21 MED ORDER — FAMOTIDINE 20 MG PO TABS
40.0000 mg | ORAL_TABLET | Freq: Once | ORAL | Status: AC
  Administered 2013-06-21: 40 mg via ORAL
  Filled 2013-06-21: qty 2

## 2013-06-21 MED ORDER — GI COCKTAIL ~~LOC~~
30.0000 mL | Freq: Once | ORAL | Status: AC
  Administered 2013-06-22: 30 mL via ORAL
  Filled 2013-06-21: qty 30

## 2013-06-21 MED ORDER — SIMETHICONE 80 MG PO CHEW
160.0000 mg | CHEWABLE_TABLET | Freq: Once | ORAL | Status: AC
  Administered 2013-06-21: 160 mg via ORAL
  Filled 2013-06-21: qty 2

## 2013-06-21 NOTE — MAU Provider Note (Signed)
History     CSN: [id number]  Arrival date and time: 06/21/13 9528   First Provider Initiated Contact with Patient 06/21/13 1918      Chief Complaint  Patient presents with  . Abdominal Pain  . Chest Pain   HPI  Mary Roberts is a 32 y.o. [id number] at [redacted]w[redacted]d who presents today with chest and abdominal pain. She states that she had the chest pain off and on for about a year. She states that it has gotten worse since she was pregnant. She states that it feels like pressure, and when it is there she rates it 10/10. She states that it is not there right now. She had the pain at about 1600 today, and it usually lasts about 20 mins.   She is also having abdominal pain. She states that the pain is "all over". It is in the upper abdomen, the lower abdomen, and along the sides.   Past Medical History  Diagnosis Date  . Seizures     No meds. currently (03/26/2013)  . Hypertension   . Headache(784.0)     migraines  . Anxiety   . Chlamydia     Past Surgical History  Procedure Laterality Date  . Cesarean section    . Mouth surgery      Family History  Problem Relation Age of Onset  . Seizures Mother   . Hypertension Mother   . Diabetes Mother   . Seizures Brother     History  Substance Use Topics  . Smoking status: Former Smoker    Types: Cigarettes  . Smokeless tobacco: Never Used  . Alcohol Use: No     Comment: socially     Allergies:  Allergies  Allergen Reactions  . Amoxicillin Hives    Prescriptions prior to admission  Medication Sig Dispense Refill  . labetalol (NORMODYNE) 200 MG tablet TAKE 2 TABLET BY MOUTH THREE TIMES DAILY  60 tablet  0  . labetalol (NORMODYNE) 200 MG tablet Take 2 tablets (400 mg total) by mouth 3 (three) times daily.  60 tablet  5  . Prenatal Vit-Fe Fumarate-FA (PRENATAL MULTIVITAMIN) TABS tablet Take 1 tablet by mouth daily at 12 noon.        ROS Physical Exam   Blood pressure 152/85, pulse 80, temperature 98.5 F (36.9 C),  temperature source Oral, resp. rate 18, height 5\' 1"  (1.549 m), weight 84.369 kg (186 lb), last menstrual period 02/19/2013, SpO2 100.00%.  Physical Exam  Nursing note and vitals reviewed. Constitutional: She is oriented to person, place, and time. She appears well-developed and well-nourished. No distress.  Cardiovascular: Normal rate, regular rhythm and normal heart sounds.   Respiratory: Effort normal.  GI: Soft. Bowel sounds are normal. There is no tenderness.  Neurological: She is alert and oriented to person, place, and time.  Skin: Skin is warm and dry.  Psychiatric: She has a normal mood and affect.   Results for orders placed during the hospital encounter of 06/21/13 (from the past 24 hour(s))  URINALYSIS, ROUTINE W REFLEX MICROSCOPIC     Status: Abnormal   Collection Time    06/21/13  7:12 PM      Result Value Range   Color, Urine YELLOW  YELLOW   APPearance CLEAR  CLEAR   Specific Gravity, Urine 1.020  1.005 - 1.030   pH 7.5  5.0 - 8.0   Glucose, UA NEGATIVE  NEGATIVE mg/dL   Hgb urine dipstick TRACE (*) NEGATIVE   Bilirubin Urine NEGATIVE  NEGATIVE   Ketones, ur NEGATIVE  NEGATIVE mg/dL   Protein, ur NEGATIVE  NEGATIVE mg/dL   Urobilinogen, UA 0.2  0.0 - 1.0 mg/dL   Nitrite NEGATIVE  NEGATIVE   Leukocytes, UA NEGATIVE  NEGATIVE  URINE MICROSCOPIC-ADD ON     Status: Abnormal   Collection Time    06/21/13  7:12 PM      Result Value Range   Squamous Epithelial / LPF MANY (*) RARE   WBC, UA 3-6  <3 WBC/hpf   RBC / HPF 3-6  <3 RBC/hpf   Bacteria, UA FEW (*) RARE   Urine-Other AMORPHOUS URATES/PHOSPHATES    CBC     Status: Abnormal   Collection Time    06/21/13  7:31 PM      Result Value Range   WBC 9.4  4.0 - 10.5 K/uL   RBC 3.18 (*) 3.87 - 5.11 MIL/uL   Hemoglobin 9.0 (*) 12.0 - 15.0 g/dL   HCT 16.1 (*) 09.6 - 04.5 %   MCV 84.6  78.0 - 100.0 fL   MCH 28.3  26.0 - 34.0 pg   MCHC 33.5  30.0 - 36.0 g/dL   RDW 40.9 (*) 81.1 - 91.4 %   Platelets 242  150 - 400  K/uL  COMPREHENSIVE METABOLIC PANEL     Status: Abnormal   Collection Time    06/21/13  7:31 PM      Result Value Range   Sodium 135  135 - 145 mEq/L   Potassium 3.2 (*) 3.5 - 5.1 mEq/L   Chloride 101  96 - 112 mEq/L   CO2 24  19 - 32 mEq/L   Glucose, Bld 81  70 - 99 mg/dL   BUN 9  6 - 23 mg/dL   Creatinine, Ser 7.82  0.50 - 1.10 mg/dL   Calcium 9.2  8.4 - 95.6 mg/dL   Total Protein 7.1  6.0 - 8.3 g/dL   Albumin 2.9 (*) 3.5 - 5.2 g/dL   AST 38 (*) 0 - 37 U/L   ALT 46 (*) 0 - 35 U/L   Alkaline Phosphatase 44  39 - 117 U/L   Total Bilirubin <0.1 (*) 0.3 - 1.2 mg/dL   GFR calc non Af Amer >90  >90 mL/min   GFR calc Af Amer >90  >90 mL/min   EKG--Normal sinus rhythm. Possible left atrial enlargement; anterior infarct, age undetermined; abnormal ECG   MAU Course  Procedures   EKG, CBC, CMET pending Pepcid and simethicone orders in  20:00: Care turned over to E. Grainger Mccarley, NP 20:20  Patient states she had a little pain after evaluation by CNM. Had Pepcid and simethicone before that which she thinks it may have made the pain less.  But it did return.  She does not think this pain is associated with eating. Discussed patient's sxs, EKG and labs with Dr. Marice Potter.  Order given to transfer to [facility] for further evaluation [facility] and spoke with Dr. Uvaldo Bristle given, transfer of care accepted by her.  She gave order to transfer by EMS Assessment and Plan    Tawnya Crook 06/21/2013, 7:19 PM   A: Chest pain with abnormal ECG     [redacted]w[redacted]d gestation  P:  Transfer to  [facility] ED for further evaluation

## 2013-06-21 NOTE — ED Notes (Signed)
Pt states she hasn't taken her normal BP med tonight. She usually takes it 3 times a day.

## 2013-06-21 NOTE — ED Notes (Signed)
Pt came into women's with intermittent chest pain over the last several weeks. Pt is G 4 P 3. [redacted] weeks pregnant.  Pt has had this problem in the past, however today there was a change in the EKG. Pt is currently experiencing chest pain as 5/10.  18g R hand

## 2013-06-21 NOTE — MAU Note (Signed)
Pt reports off/on chest pain. Has been happening since before pregnancy. States it feels like gas pain. Generalized pulling pain in stomach off/on for about 2 -3 weeks. Denies dysuria. Goes to [facility] due to history of seizures, history of preeclampsia, history of c-section.

## 2013-06-21 NOTE — ED Provider Notes (Signed)
CSN: [id number]     Arrival date & time 06/21/13  1856 History   First MD Initiated Contact with Patient 06/21/13 2219     Chief Complaint  Patient presents with  . Abdominal Pain  . Chest Pain   (Consider location/radiation/quality/duration/timing/severity/associated sxs/prior Treatment) HPI Comments: Mary Roberts is a 32 y.o. [id number] at [redacted]w[redacted]d, currently a high risk pregnancy who presents today with chest and abdominal pain.  The patient was evaluated at MAU and was transported to [facility] due to intermittent Chest pain for 1 year.  The patient describes the discomfort as non-radiating central pressure.  She reports multiple episodes over the past year but reports an increase in frequency since her pregnancy. Current discomfort is rated at a 5/10 and reports 10/10 discomfort at it's worst. She reports the pain is aggravated by movement.  She reports that the discomfort is like an "burp inside".  She reports similar episodes prior to her pregnancy but became more frequent since the pregnancy. Records show she was given a Pepcid and Mylicon at MAU, patient reports these did not relieve the symptoms.  She reports she has not taken her Labetalol this evening.   The history is provided by the patient and medical records. No language interpreter was used.    Past Medical History  Diagnosis Date  . Seizures     No meds. currently (03/26/2013)  . Hypertension   . Headache(784.0)     migraines  . Anxiety   . Chlamydia    Past Surgical History  Procedure Laterality Date  . Cesarean section    . Mouth surgery     Family History  Problem Relation Age of Onset  . Seizures Mother   . Hypertension Mother   . Diabetes Mother   . Seizures Brother    History  Substance Use Topics  . Smoking status: Former Smoker    Types: Cigarettes  . Smokeless tobacco: Never Used  . Alcohol Use: No     Comment: socially    OB History   Grav Para Term Preterm Abortions TAB SAB Ect Mult Living   4  2 2  1 1    2      Review of Systems  Constitutional: Negative for fever and chills.  Respiratory: Negative for cough, shortness of breath and wheezing.   Cardiovascular: Positive for chest pain. Negative for leg swelling.  Gastrointestinal: Negative for nausea, vomiting, diarrhea, constipation and anal bleeding.  Genitourinary: Negative for dysuria and hematuria.  Neurological: Negative for headaches.    Allergies  Amoxicillin  Home Medications   Current Outpatient Rx  Name  Route  Sig  Dispense  Refill  . acetaminophen (TYLENOL) 325 MG tablet   Oral   Take 650 mg by mouth every 6 (six) hours as needed for headache.         . IRON PO   Oral   Take 1 tablet by mouth daily with supper.         . labetalol (NORMODYNE) 200 MG tablet   Oral   Take 2 tablets (400 mg total) by mouth 3 (three) times daily.   60 tablet   5   . Prenatal Vit-Fe Fumarate-FA (PRENATAL MULTIVITAMIN) TABS tablet   Oral   Take 1 tablet by mouth at bedtime.           BP 164/81  Pulse 77  Temp(Src) 98.5 F (36.9 C) (Oral)  Resp 18  Ht 5\' 1"  (1.549 m)  Wt 186 lb (  84.369 kg)  BMI 35.16 kg/m2  SpO2 100%  LMP 02/19/2013 Physical Exam  Nursing note and vitals reviewed. Constitutional: She is oriented to person, place, and time. She appears well-developed and well-nourished. No distress.  HENT:  Head: Normocephalic and atraumatic.  Eyes: No scleral icterus.  Neck: Neck supple.  Cardiovascular: Normal rate and regular rhythm.   No lower extremity edema.  Abdominal: Soft. Bowel sounds are normal. She exhibits distension. There is no tenderness. There is no guarding.  Musculoskeletal: Normal range of motion.  Neurological: She is alert and oriented to person, place, and time.  Skin: Skin is warm. She is not diaphoretic. No erythema.    ED Course  Procedures (including critical care time) Labs Review Labs Reviewed  URINALYSIS, ROUTINE W REFLEX MICROSCOPIC - Abnormal; Notable for the  following:    Hgb urine dipstick TRACE (*)    All other components within normal limits  CBC - Abnormal; Notable for the following:    RBC 3.18 (*)    Hemoglobin 9.0 (*)    HCT 26.9 (*)    RDW 16.7 (*)    All other components within normal limits  COMPREHENSIVE METABOLIC PANEL - Abnormal; Notable for the following:    Potassium 3.2 (*)    Albumin 2.9 (*)    AST 38 (*)    ALT 46 (*)    Total Bilirubin <0.1 (*)    All other components within normal limits  URINE MICROSCOPIC-ADD ON - Abnormal; Notable for the following:    Squamous Epithelial / LPF MANY (*)    Bacteria, UA FEW (*)    All other components within normal limits  URINE CULTURE   Imaging Review No results found.  EKG: Normal sinus rhythm Possible Left atrial enlargement Anterior infarct , age undetermined Abnormal ECG When compared with ECG of 03/08/2010, No significant change was found Confirmed by [facility] MD, DAVID ([id number]) on 06/21/2013 11:17:38 PM  MDM   1. Chest pain   2. Second trimester pregnancy   3. Low blood potassium    Pt was evaluated by MAU PTA due to chest discomfort for over a year.  Hypertensive in the ED, without protein in urine. Discussed patient history, condition, and labs with Dr. Preston Fleeting.  After his evaluation reports likely GERD, GI cocktail given. EKG no significant changes from 2011.  Re-eval patient reports symptom improvement.  Discussed patient history, condition, and labs with Dr. Preston Fleeting who agrees the patient can be evaluated as an out-pt and placing the patient on a PPI. Re-eval patient reports she has not had another episode of the discomfort since GI cocktail. Discussed lab results, and treatment plan with the patient. Return precautions given. Reports understanding and no other concerns at this time.  Patient is stable for discharge at this time.  Meds given in ED:  Medications  famotidine (PEPCID) tablet 40 mg (40 mg Oral Given 06/21/13 1937)  simethicone (MYLICON) chewable tablet  160 mg (160 mg Oral Given 06/21/13 1938)  gi cocktail (Maalox,Lidocaine,Donnatal) (30 mLs Oral Given 06/22/13 0001)  labetalol (NORMODYNE) tablet 400 mg (400 mg Oral Given 06/22/13 0049)    Discharge Medication List as of 06/22/2013  1:24 AM    START taking these medications   Details  omeprazole (PRILOSEC) 20 MG capsule Take 1 capsule (20 mg total) by mouth daily., Starting 06/22/2013, Until Discontinued, Print             Clabe Seal, PA-C 06/23/13 1420

## 2013-06-22 LAB — URINE CULTURE

## 2013-06-22 MED ORDER — LABETALOL HCL 200 MG PO TABS
400.0000 mg | ORAL_TABLET | Freq: Once | ORAL | Status: AC
  Administered 2013-06-22: 400 mg via ORAL
  Filled 2013-06-22: qty 2

## 2013-06-22 MED ORDER — OMEPRAZOLE 20 MG PO CPDR: 20.0000 mg | DELAYED_RELEASE_CAPSULE | Freq: Every day | ORAL | Status: DC

## 2013-06-22 NOTE — ED Provider Notes (Signed)
32 year old female who is [redacted] weeks pregnant is sent over for evaluation of chest pain. She describes a sternal area a pressure feeling she is going through to the back. This is actually been present from before the time she got pregnant and has gotten worse since she's been pregnant. Is worse when she lies down. There is no associated dyspnea, nausea, diaphoresis. On exam, she does have some chest wall tenderness but does not reproduce her pain. Lungs are clear and heart has regular rate and rhythm. Abdomen is soft and nontender. Clinically, this appears to be gastroesophageal reflux. She'll be given a therapeutic trial of a GI cocktail. She is noted to be hypertensive today and also with mild elevation of LFTs. She does not have any protein in her urine but she needs to be watched closely for development of preeclampsia. Anemia is noted which is stable. Old records are reviewed and she actually has had elevated blood pressure most visits. She will probably need more aggressive blood pressure control.  Medical screening examination/treatment/procedure(s) were conducted as a shared visit with non-physician practitioner(s) and myself.  I personally evaluated the patient during the encounter.  ECG shows normal sinus rhythm with a rate of 76, no ectopy. Normal axis. Normal P wave. Normal QRS. Normal intervals. Normal ST and T waves. Impression: normal ECG. When compared with ECG of 03/08/2010, no significant changes are seen.   Dione Booze, MD 06/22/13 [phone]

## 2013-06-24 NOTE — L&D Delivery Note (Signed)
Delivery Note At 8:26 PM a viable female was delivered via Vaginal, Spontaneous Delivery (Presentation: ; Occiput Anterior).  APGAR: 9, 9; weight 6 lb 1.2 oz (2756 g).   Placenta status: Intact, Spontaneous.  Cord: 3 vessels with the following complications: None.  Cord pH: none  Anesthesia: Epidural  Episiotomy: None Lacerations: None Suture Repair: NA Est. Blood Loss (mL): 350  Mom to AICU.  Baby to Couplet care / Skin to Skin.  At 20:26 this 33 y.o. [id number] at [redacted]w[redacted]d admitted for IOL for CHTN with superimposed Severe Pre-eclampsia delivered a viable female infant over an intact perineum. Nuchal cord x 1 reduced.  Active management of third stage of labor. Pitocin given.  Spontaneous delivery of placenta.  3-vessel cord, intact placenta. No laceration thus no repair needed.  EBL 264ml. Patient stable to AICU on MgSO4.      Clarisa Schools 10/29/2013, 11:07 PM

## 2013-06-24 NOTE — ED Provider Notes (Signed)
Medical screening examination/treatment/procedure(s) were performed by non-physician practitioner and as supervising physician I was immediately available for consultation/collaboration.   Delora Fuel, MD [id number]/[phone]

## 2013-06-24 NOTE — L&D Delivery Note (Signed)
I have seen and examined this patient and I agree with the above. Plans on BTL in the AM. Myrtis Ser 11:59 PM 10/29/2013

## 2013-07-01 ENCOUNTER — Ambulatory Visit (HOSPITAL_COMMUNITY)
Admission: RE | Admit: 2013-07-01 | Discharge: 2013-07-01 | Disposition: A | Discharge status: Home / Self Care | Payer: Medicaid Other | Source: Ambulatory Visit | Attending: Obstetrics & Gynecology | Admitting: Obstetrics & Gynecology

## 2013-07-01 DIAGNOSIS — O10912 Unspecified pre-existing hypertension complicating pregnancy, second trimester: Secondary | ICD-10-CM

## 2013-07-01 DIAGNOSIS — Z3689 Encounter for other specified antenatal screening: Secondary | ICD-10-CM | POA: Insufficient documentation

## 2013-07-01 DIAGNOSIS — O10019 Pre-existing essential hypertension complicating pregnancy, unspecified trimester: Secondary | ICD-10-CM | POA: Insufficient documentation

## 2013-07-13 ENCOUNTER — Ambulatory Visit (INDEPENDENT_AMBULATORY_CARE_PROVIDER_SITE_OTHER): Payer: Medicaid Other | Admitting: Advanced Practice Midwife

## 2013-07-13 VITALS — BP 136/90 | Temp 98.1°F | Wt 189.1 lb

## 2013-07-13 DIAGNOSIS — O10919 Unspecified pre-existing hypertension complicating pregnancy, unspecified trimester: Secondary | ICD-10-CM

## 2013-07-13 DIAGNOSIS — O10019 Pre-existing essential hypertension complicating pregnancy, unspecified trimester: Secondary | ICD-10-CM

## 2013-07-13 DIAGNOSIS — Z3490 Encounter for supervision of normal pregnancy, unspecified, unspecified trimester: Secondary | ICD-10-CM

## 2013-07-13 DIAGNOSIS — I10 Essential (primary) hypertension: Secondary | ICD-10-CM

## 2013-07-13 LAB — POCT URINALYSIS DIP (DEVICE)
Bilirubin Urine: NEGATIVE
Glucose, UA: NEGATIVE mg/dL
Leukocytes, UA: NEGATIVE
Nitrite: NEGATIVE
PH: 6.5 (ref 5.0–8.0)
Protein, ur: 30 mg/dL — AB
Specific Gravity, Urine: >=1.03 (ref 1.005–1.030)
Urobilinogen, UA: 1 mg/dL (ref 0.0–1.0)

## 2013-07-13 NOTE — Progress Notes (Signed)
Doing well. Some pressure. No contractions. Using splint for Carpal Tunnel on Right. Will schedule followup US for anatomy

## 2013-07-13 NOTE — Patient Instructions (Signed)
Second Trimester of Pregnancy The second trimester is from week 13 through week 28, months 4 through 6. The second trimester is often a time when you feel your best. Your body has also adjusted to being pregnant, and you begin to feel better physically. Usually, morning sickness has lessened or quit completely, you may have more energy, and you may have an increase in appetite. The second trimester is also a time when the fetus is growing rapidly. At the end of the sixth month, the fetus is about 9 inches long and weighs about 1 pounds. You will likely begin to feel the baby move (quickening) between 18 and 20 weeks of the pregnancy. BODY CHANGES Your body goes through many changes during pregnancy. The changes vary from woman to woman.   Your weight will continue to increase. You will notice your lower abdomen bulging out.  You may begin to get stretch marks on your hips, abdomen, and breasts.  You may develop headaches that can be relieved by medicines approved by your caregiver.  You may urinate more often because the fetus is pressing on your bladder.  You may develop or continue to have heartburn as a result of your pregnancy.  You may develop constipation because certain hormones are causing the muscles that push waste through your intestines to slow down.  You may develop hemorrhoids or swollen, bulging veins (varicose veins).  You may have back pain because of the weight gain and pregnancy hormones relaxing your joints between the bones in your pelvis and as a result of a shift in weight and the muscles that support your balance.  Your breasts will continue to grow and be tender.  Your gums may bleed and may be sensitive to brushing and flossing.  Dark spots or blotches (chloasma, mask of pregnancy) may develop on your face. This will likely fade after the baby is born.  A dark line from your belly button to the pubic area (linea nigra) may appear. This will likely fade after the  baby is born. WHAT TO EXPECT AT YOUR PRENATAL VISITS During a routine prenatal visit:  You will be weighed to make sure you and the fetus are growing normally.  Your blood pressure will be taken.  Your abdomen will be measured to track your baby's growth.  The fetal heartbeat will be listened to.  Any test results from the previous visit will be discussed. Your caregiver may ask you:  How you are feeling.  If you are feeling the baby move.  If you have had any abnormal symptoms, such as leaking fluid, bleeding, severe headaches, or abdominal cramping.  If you have any questions. Other tests that may be performed during your second trimester include:  Blood tests that check for:  Low iron levels (anemia).  Gestational diabetes (between 24 and 28 weeks).  Rh antibodies.  Urine tests to check for infections, diabetes, or protein in the urine.  An ultrasound to confirm the proper growth and development of the baby.  An amniocentesis to check for possible genetic problems.  Fetal screens for spina bifida and Down syndrome. HOME CARE INSTRUCTIONS   Avoid all smoking, herbs, alcohol, and unprescribed drugs. These chemicals affect the formation and growth of the baby.  Follow your caregiver's instructions regarding medicine use. There are medicines that are either safe or unsafe to take during pregnancy.  Exercise only as directed by your caregiver. Experiencing uterine cramps is a good sign to stop exercising.  Continue to eat regular,   healthy meals.  Wear a good support bra for breast tenderness.  Do not use hot tubs, steam rooms, or saunas.  Wear your seat belt at all times when driving.  Avoid raw meat, uncooked cheese, cat litter boxes, and soil used by cats. These carry germs that can cause birth defects in the baby.  Take your prenatal vitamins.  Try taking a stool softener (if your caregiver approves) if you develop constipation. Eat more high-fiber foods,  such as fresh vegetables or fruit and whole grains. Drink plenty of fluids to keep your urine clear or pale yellow.  Take warm sitz baths to soothe any pain or discomfort caused by hemorrhoids. Use hemorrhoid cream if your caregiver approves.  If you develop varicose veins, wear support hose. Elevate your feet for 15 minutes, 3 4 times a day. Limit salt in your diet.  Avoid heavy lifting, wear low heel shoes, and practice good posture.  Rest with your legs elevated if you have leg cramps or low back pain.  Visit your dentist if you have not gone yet during your pregnancy. Use a soft toothbrush to brush your teeth and be gentle when you floss.  A sexual relationship may be continued unless your caregiver directs you otherwise.  Continue to go to all your prenatal visits as directed by your caregiver. SEEK MEDICAL CARE IF:   You have dizziness.  You have mild pelvic cramps, pelvic pressure, or nagging pain in the abdominal area.  You have persistent nausea, vomiting, or diarrhea.  You have a bad smelling vaginal discharge.  You have pain with urination. SEEK IMMEDIATE MEDICAL CARE IF:   You have a fever.  You are leaking fluid from your vagina.  You have spotting or bleeding from your vagina.  You have severe abdominal cramping or pain.  You have rapid weight gain or loss.  You have shortness of breath with chest pain.  You notice sudden or extreme swelling of your face, hands, ankles, feet, or legs.  You have not felt your baby move in over an hour.  You have severe headaches that do not go away with medicine.  You have vision changes. Document Released: 06/04/2001 Document Revised: 02/10/2013 Document Reviewed: 08/11/2012 [employer] Patient Information 2014 [employer].  

## 2013-07-13 NOTE — Progress Notes (Signed)
P= 89 Edema in hands. C/o of lower abdominal/pelvic/vaginal pressure.

## 2013-08-05 ENCOUNTER — Ambulatory Visit (HOSPITAL_COMMUNITY): Payer: Medicaid Other

## 2013-08-06 ENCOUNTER — Encounter: Payer: Self-pay | Admitting: Advanced Practice Midwife

## 2013-08-06 ENCOUNTER — Ambulatory Visit (HOSPITAL_COMMUNITY)
Admission: RE | Admit: 2013-08-06 | Discharge: 2013-08-06 | Disposition: A | Discharge status: Home / Self Care | Payer: Medicaid Other | Source: Ambulatory Visit | Attending: Advanced Practice Midwife | Admitting: Advanced Practice Midwife

## 2013-08-06 DIAGNOSIS — Z3689 Encounter for other specified antenatal screening: Secondary | ICD-10-CM | POA: Insufficient documentation

## 2013-08-06 DIAGNOSIS — O10919 Unspecified pre-existing hypertension complicating pregnancy, unspecified trimester: Secondary | ICD-10-CM

## 2013-08-06 DIAGNOSIS — O10019 Pre-existing essential hypertension complicating pregnancy, unspecified trimester: Secondary | ICD-10-CM | POA: Insufficient documentation

## 2013-08-11 ENCOUNTER — Ambulatory Visit (INDEPENDENT_AMBULATORY_CARE_PROVIDER_SITE_OTHER): Payer: Medicaid Other | Admitting: Family

## 2013-08-11 VITALS — BP 120/84 | Temp 98.2°F | Wt 192.1 lb

## 2013-08-11 DIAGNOSIS — I10 Essential (primary) hypertension: Secondary | ICD-10-CM

## 2013-08-11 DIAGNOSIS — O10919 Unspecified pre-existing hypertension complicating pregnancy, unspecified trimester: Secondary | ICD-10-CM

## 2013-08-11 DIAGNOSIS — O10019 Pre-existing essential hypertension complicating pregnancy, unspecified trimester: Secondary | ICD-10-CM

## 2013-08-11 LAB — POCT URINALYSIS DIP (DEVICE)
Bilirubin Urine: NEGATIVE
Glucose, UA: NEGATIVE mg/dL
KETONES UR: NEGATIVE mg/dL
Nitrite: NEGATIVE
PH: 7 (ref 5.0–8.0)
PROTEIN: 30 mg/dL — AB
Specific Gravity, Urine: 1.02 (ref 1.005–1.030)
Urobilinogen, UA: 0.2 mg/dL (ref 0.0–1.0)

## 2013-08-11 MED ORDER — FLUCONAZOLE 150 MG PO TABS: 150.0000 mg | ORAL_TABLET | Freq: Once | ORAL | Status: DC

## 2013-08-11 MED ORDER — OMEPRAZOLE 20 MG PO CPDR: 20.0000 mg | DELAYED_RELEASE_CAPSULE | Freq: Every day | ORAL | Status: DC

## 2013-08-11 MED ORDER — INTEGRA F 125-1 MG PO CAPS: 1.0000 | ORAL_CAPSULE | Freq: Every day | ORAL | Status: DC

## 2013-08-11 NOTE — Progress Notes (Signed)
Pulse- 93 Patient reports vaginal irritation, itching, pressure and swelling; also reports pelvic pressure and pain/soreness in her hips and thighs

## 2013-08-11 NOTE — Progress Notes (Signed)
Reports increased numbness in right hand at night, hx of carpal tunnel, recommended wearing brace again.  Also reports vaginal itching and swelling, no report of vaginal bleeding (+fungal organism on pap smear).  Exam - +white cottage cheese appearing discharge, +redness, no lesions, no bleeding, cervix closed and thick.  Wet prep collected, RX sent for Diflucan.  Vomiting returning, no fever or body aches; does not want to take RX meds, recommended vit B6 and ginger.  Dental letter given to see provider for swollen gums.  Pt will be transferred to high risk clinic due to chronic hypertension > will recollect 24 urine (protein was not reported on last collection).

## 2013-08-12 LAB — WET PREP, GENITAL
CLUE CELLS WET PREP: NONE SEEN
Trich, Wet Prep: NONE SEEN
WBC WET PREP: NONE SEEN

## 2013-08-16 ENCOUNTER — Encounter (HOSPITAL_COMMUNITY): Payer: Self-pay

## 2013-08-16 ENCOUNTER — Inpatient Hospital Stay (HOSPITAL_COMMUNITY)
Admission: AD | Admit: 2013-08-16 | Discharge: 2013-08-16 | Disposition: A | Discharge status: Home / Self Care | Payer: Medicaid Other | Source: Ambulatory Visit | Attending: Obstetrics and Gynecology | Admitting: Obstetrics and Gynecology

## 2013-08-16 DIAGNOSIS — Z87891 Personal history of nicotine dependence: Secondary | ICD-10-CM | POA: Insufficient documentation

## 2013-08-16 DIAGNOSIS — O10019 Pre-existing essential hypertension complicating pregnancy, unspecified trimester: Secondary | ICD-10-CM | POA: Insufficient documentation

## 2013-08-16 DIAGNOSIS — R319 Hematuria, unspecified: Secondary | ICD-10-CM

## 2013-08-16 DIAGNOSIS — N949 Unspecified condition associated with female genital organs and menstrual cycle: Secondary | ICD-10-CM | POA: Insufficient documentation

## 2013-08-16 DIAGNOSIS — R109 Unspecified abdominal pain: Secondary | ICD-10-CM | POA: Insufficient documentation

## 2013-08-16 LAB — URINALYSIS, ROUTINE W REFLEX MICROSCOPIC
BILIRUBIN URINE: NEGATIVE
Glucose, UA: NEGATIVE mg/dL
KETONES UR: NEGATIVE mg/dL
LEUKOCYTES UA: NEGATIVE
NITRITE: NEGATIVE
Protein, ur: NEGATIVE mg/dL
SPECIFIC GRAVITY, URINE: 1.02 (ref 1.005–1.030)
Urobilinogen, UA: 0.2 mg/dL (ref 0.0–1.0)
pH: 6.5 (ref 5.0–8.0)

## 2013-08-16 LAB — WET PREP, GENITAL
Clue Cells Wet Prep HPF POC: NONE SEEN
Trich, Wet Prep: NONE SEEN
YEAST WET PREP: NONE SEEN

## 2013-08-16 LAB — URINE MICROSCOPIC-ADD ON

## 2013-08-16 LAB — POCT FERN TEST: POCT Fern Test: NEGATIVE

## 2013-08-16 NOTE — MAU Provider Note (Signed)
History     CSN: [id number]  Arrival date and time: 08/16/13 1751   First Provider Initiated Contact with Patient 08/16/13 1929      Chief Complaint  Patient presents with  . Vaginal Bleeding   HPI  33 y/o [id number] here at 25.3 with complaints of hematuria this afternoon that has resolved. She also has complaints of crampy suprapubic pain and contractions since the hematuria came on. She states that around 4 pm this afternoon she noticed red colored urine and came in for evaluation. When she gave her UA sample she felt the red color had left. She has had approx 3-4 contractions today and states that she has been leaking a small amount of clear fluid for the last 2 days. She denies headache, Scotoma, and RUQ pain and she is taking her BP meds as prescribed. She denies decreased fetal movement and vaginal bleeding.   OB History   Grav Para Term Preterm Abortions TAB SAB Ect Mult Living   4 2 2  1 1    2       Past Medical History  Diagnosis Date  . Seizures     No meds. currently (03/26/2013)  . Hypertension   . Headache(784.0)     migraines  . Anxiety   . Chlamydia     Past Surgical History  Procedure Laterality Date  . Cesarean section    . Mouth surgery      Family History  Problem Relation Age of Onset  . Seizures Mother   . Hypertension Mother   . Diabetes Mother   . Seizures Brother     History  Substance Use Topics  . Smoking status: Former Research scientist (life sciences)  . Smokeless tobacco: Never Used  . Alcohol Use: No     Comment: socially     Allergies:  Allergies  Allergen Reactions  . Amoxicillin Hives    Prescriptions prior to admission  Medication Sig Dispense Refill  . acetaminophen (TYLENOL) 325 MG tablet Take 650 mg by mouth every 6 (six) hours as needed for headache.      . fluconazole (DIFLUCAN) 150 MG tablet Take 1 tablet (150 mg total) by mouth once.  1 tablet  1  . IRON PO Take 1 tablet by mouth daily.      Marland Kitchen labetalol (NORMODYNE) 200 MG tablet Take 2  tablets (400 mg total) by mouth 3 (three) times daily.  60 tablet  5  . omeprazole (PRILOSEC) 20 MG capsule Take 1 capsule (20 mg total) by mouth daily.  30 capsule  1  . Prenatal Vit-Fe Fumarate-FA (PRENATAL MULTIVITAMIN) TABS tablet Take 1 tablet by mouth at bedtime.       . Fe Fum-FePoly-FA-Vit C-Vit B3 (INTEGRA F) 125-1 MG CAPS Take 1 tablet by mouth daily.  30 capsule  1    ROS Per HPI Physical Exam   Blood pressure 141/83, pulse 81, temperature 98.3 F (36.8 C), temperature source Oral, resp. rate 18, last menstrual period 02/19/2013, SpO2 100.00%.  Physical Exam  Gen: NAD, alert, cooperative with exam HEENT: NCAT CV: RRR, good S1/S2, no murmur Resp: CTABL, no wheezes, non-labored Abd: Soft gravid abd without tenderness to palp, no suprapubic tenderness Ext: No edema, warm Neuro: Alert and oriented, No gross deficits, 2+ DTRs  FHT: baseline 140, difficult to trace, mod variability, no accels or decels Toco: Irritability, no contractions  Dilation: Closed Effacement (%): Thick Cervical Position: Posterior Exam by:: Alen Bleacher MD  MAU Course  Procedures  MDM Results for  orders placed during the hospital encounter of 08/16/13 (from the past 24 hour(s))  URINALYSIS, ROUTINE W REFLEX MICROSCOPIC     Status: Abnormal   Collection Time    08/16/13  6:33 PM      Result Value Ref Range   Color, Urine YELLOW  YELLOW   APPearance CLEAR  CLEAR   Specific Gravity, Urine 1.020  1.005 - 1.030   pH 6.5  5.0 - 8.0   Glucose, UA NEGATIVE  NEGATIVE mg/dL   Hgb urine dipstick MODERATE (*) NEGATIVE   Bilirubin Urine NEGATIVE  NEGATIVE   Ketones, ur NEGATIVE  NEGATIVE mg/dL   Protein, ur NEGATIVE  NEGATIVE mg/dL   Urobilinogen, UA 0.2  0.0 - 1.0 mg/dL   Nitrite NEGATIVE  NEGATIVE   Leukocytes, UA NEGATIVE  NEGATIVE  URINE MICROSCOPIC-ADD ON     Status: Abnormal   Collection Time    08/16/13  6:33 PM      Result Value Ref Range   Squamous Epithelial / LPF MANY (*) RARE   WBC,  UA 3-6  <3 WBC/hpf   RBC / HPF 11-20  <3 RBC/hpf   Bacteria, UA FEW (*) RARE   Urine-Other MUCOUS PRESENT    WET PREP, GENITAL     Status: Abnormal   Collection Time    08/16/13  9:17 PM      Result Value Ref Range   Yeast Wet Prep HPF POC NONE SEEN  NONE SEEN   Trich, Wet Prep NONE SEEN  NONE SEEN   Clue Cells Wet Prep HPF POC NONE SEEN  NONE SEEN   WBC, Wet Prep HPF POC FEW (*) NONE SEEN  POCT FERN TEST     Status: None   Collection Time    08/16/13  9:21 PM      Result Value Ref Range   POCT Fern Test Negative = intact amniotic membranes        Assessment and Plan  33 y/o G4P2012 at 25.3 with chronic hypertension here with complaints of hematuria, suprapubic pain, contractions, and LOF.   Hematuria- now resolved on a gross level, UA with moderate Hgb which is stable from 2 months ago and 5 days ago, no suspicion for infection from results.  - will send for urine culture  Chronic HTN -  Pressure 141/83 today, no symptoms of pre-eclampsia - Continue labetalol - Given supplies for 24 hour urine protein collection  LOF unlikley PPROM with ferning negative  Kenn File 08/16/2013, 9:16 PM   I have seen the patient with the resident/student and agree with the above.  Mathis Bud

## 2013-08-16 NOTE — Discharge Instructions (Signed)
Second Trimester of Pregnancy The second trimester is from week 13 through week 28, months 4 through 6. The second trimester is often a time when you feel your best. Your body has also adjusted to being pregnant, and you begin to feel better physically. Usually, morning sickness has lessened or quit completely, you may have more energy, and you may have an increase in appetite. The second trimester is also a time when the fetus is growing rapidly. At the end of the sixth month, the fetus is about 9 inches long and weighs about 1 pounds. You will likely begin to feel the baby move (quickening) between 18 and 20 weeks of the pregnancy. BODY CHANGES Your body goes through many changes during pregnancy. The changes vary from woman to woman.   Your weight will continue to increase. You will notice your lower abdomen bulging out.  You may begin to get stretch marks on your hips, abdomen, and breasts.  You may develop headaches that can be relieved by medicines approved by your caregiver.  You may urinate more often because the fetus is pressing on your bladder.  You may develop or continue to have heartburn as a result of your pregnancy.  You may develop constipation because certain hormones are causing the muscles that push waste through your intestines to slow down.  You may develop hemorrhoids or swollen, bulging veins (varicose veins).  You may have back pain because of the weight gain and pregnancy hormones relaxing your joints between the bones in your pelvis and as a result of a shift in weight and the muscles that support your balance.  Your breasts will continue to grow and be tender.  Your gums may bleed and may be sensitive to brushing and flossing.  Dark spots or blotches (chloasma, mask of pregnancy) may develop on your face. This will likely fade after the baby is born.  A dark line from your belly button to the pubic area (linea nigra) may appear. This will likely fade after the  baby is born. WHAT TO EXPECT AT YOUR PRENATAL VISITS During a routine prenatal visit:  You will be weighed to make sure you and the fetus are growing normally.  Your blood pressure will be taken.  Your abdomen will be measured to track your baby's growth.  The fetal heartbeat will be listened to.  Any test results from the previous visit will be discussed. Your caregiver may ask you:  How you are feeling.  If you are feeling the baby move.  If you have had any abnormal symptoms, such as leaking fluid, bleeding, severe headaches, or abdominal cramping.  If you have any questions. Other tests that may be performed during your second trimester include:  Blood tests that check for:  Low iron levels (anemia).  Gestational diabetes (between 24 and 28 weeks).  Rh antibodies.  Urine tests to check for infections, diabetes, or protein in the urine.  An ultrasound to confirm the proper growth and development of the baby.  An amniocentesis to check for possible genetic problems.  Fetal screens for spina bifida and Down syndrome. HOME CARE INSTRUCTIONS   Avoid all smoking, herbs, alcohol, and unprescribed drugs. These chemicals affect the formation and growth of the baby.  Follow your caregiver's instructions regarding medicine use. There are medicines that are either safe or unsafe to take during pregnancy.  Exercise only as directed by your caregiver. Experiencing uterine cramps is a good sign to stop exercising.  Continue to eat regular,   healthy meals.  Wear a good support bra for breast tenderness.  Do not use hot tubs, steam rooms, or saunas.  Wear your seat belt at all times when driving.  Avoid raw meat, uncooked cheese, cat litter boxes, and soil used by cats. These carry germs that can cause birth defects in the baby.  Take your prenatal vitamins.  Try taking a stool softener (if your caregiver approves) if you develop constipation. Eat more high-fiber foods,  such as fresh vegetables or fruit and whole grains. Drink plenty of fluids to keep your urine clear or pale yellow.  Take warm sitz baths to soothe any pain or discomfort caused by hemorrhoids. Use hemorrhoid cream if your caregiver approves.  If you develop varicose veins, wear support hose. Elevate your feet for 15 minutes, 3 4 times a day. Limit salt in your diet.  Avoid heavy lifting, wear low heel shoes, and practice good posture.  Rest with your legs elevated if you have leg cramps or low back pain.  Visit your dentist if you have not gone yet during your pregnancy. Use a soft toothbrush to brush your teeth and be gentle when you floss.  A sexual relationship may be continued unless your caregiver directs you otherwise.  Continue to go to all your prenatal visits as directed by your caregiver. SEEK MEDICAL CARE IF:   You have dizziness.  You have mild pelvic cramps, pelvic pressure, or nagging pain in the abdominal area.  You have persistent nausea, vomiting, or diarrhea.  You have a bad smelling vaginal discharge.  You have pain with urination. SEEK IMMEDIATE MEDICAL CARE IF:   You have a fever.  You are leaking fluid from your vagina.  You have spotting or bleeding from your vagina.  You have severe abdominal cramping or pain.  You have rapid weight gain or loss.  You have shortness of breath with chest pain.  You notice sudden or extreme swelling of your face, hands, ankles, feet, or legs.  You have not felt your baby move in over an hour.  You have severe headaches that do not go away with medicine.  You have vision changes. Document Released: 06/04/2001 Document Revised: 02/10/2013 Document Reviewed: 08/11/2012 [employer] Patient Information 2014 [employer].  

## 2013-08-16 NOTE — MAU Note (Signed)
Pt states here for bleeding noted when voiding only. Was not on tissue when wiping. Came in here once early in pregnancy for bleeding. Denies uti s/s. Is feeling lower pelvic pain, like cramps.

## 2013-08-17 NOTE — MAU Provider Note (Signed)
Attestation of Attending Supervision of Advanced Practitioner (CNM/NP): Evaluation and management procedures were performed by the Advanced Practitioner under my supervision and collaboration.  I have reviewed the Advanced Practitioner's note and chart, and I agree with the management and plan.  Adamae Ricklefs 08/17/2013 5:25 AM

## 2013-08-18 LAB — CULTURE, OB URINE

## 2013-08-23 ENCOUNTER — Other Ambulatory Visit: Payer: Medicaid Other

## 2013-08-23 DIAGNOSIS — O10019 Pre-existing essential hypertension complicating pregnancy, unspecified trimester: Secondary | ICD-10-CM

## 2013-08-23 NOTE — Progress Notes (Unsigned)
Pt brought 24 hr urine specimen; labs drawn. Next clinic appt on 3/5 @ 0900.

## 2013-08-24 LAB — COMPREHENSIVE METABOLIC PANEL
ALK PHOS: 49 U/L (ref 39–117)
ALT: 14 U/L (ref 0–35)
AST: 17 U/L (ref 0–37)
Albumin: 3.7 g/dL (ref 3.5–5.2)
BILIRUBIN TOTAL: 0.3 mg/dL (ref 0.2–1.2)
BUN: 6 mg/dL (ref 6–23)
CO2: 21 mEq/L (ref 19–32)
Calcium: 9.2 mg/dL (ref 8.4–10.5)
Chloride: 105 mEq/L (ref 96–112)
Creat: 0.56 mg/dL (ref 0.50–1.10)
Glucose, Bld: 81 mg/dL (ref 70–99)
Potassium: 3.9 mEq/L (ref 3.5–5.3)
SODIUM: 135 meq/L (ref 135–145)
TOTAL PROTEIN: 6.5 g/dL (ref 6.0–8.3)

## 2013-08-24 LAB — PROTEIN, URINE, 24 HOUR
PROTEIN 24H UR: 109 mg/d — AB (ref 50–100)
PROTEIN, URINE: 15 mg/dL

## 2013-08-24 LAB — CBC
HCT: 30.7 % — ABNORMAL LOW (ref 36.0–46.0)
Hemoglobin: 10.3 g/dL — ABNORMAL LOW (ref 12.0–15.0)
MCH: 30.5 pg (ref 26.0–34.0)
MCHC: 33.6 g/dL (ref 30.0–36.0)
MCV: 90.8 fL (ref 78.0–100.0)
Platelets: 227 10*3/uL (ref 150–400)
RBC: 3.38 MIL/uL — AB (ref 3.87–5.11)
RDW: 15.5 % (ref 11.5–15.5)
WBC: 7.3 10*3/uL (ref 4.0–10.5)

## 2013-08-24 LAB — CREATININE CLEARANCE, URINE, 24 HOUR
CREAT CLEAR: 135 mL/min — AB (ref 75–115)
Creatinine, 24H Ur: 1085 mg/d (ref 700–1800)
Creatinine, Urine: 149.7 mg/dL
Creatinine: 0.56 mg/dL (ref 0.50–1.10)

## 2013-08-26 ENCOUNTER — Ambulatory Visit (INDEPENDENT_AMBULATORY_CARE_PROVIDER_SITE_OTHER): Payer: Medicaid Other | Admitting: Obstetrics & Gynecology

## 2013-08-26 VITALS — BP 126/82 | Temp 98.8°F | Wt 194.2 lb

## 2013-08-26 DIAGNOSIS — O10019 Pre-existing essential hypertension complicating pregnancy, unspecified trimester: Secondary | ICD-10-CM

## 2013-08-26 LAB — POCT URINALYSIS DIP (DEVICE)
BILIRUBIN URINE: NEGATIVE
Glucose, UA: NEGATIVE mg/dL
Ketones, ur: NEGATIVE mg/dL
NITRITE: NEGATIVE
Protein, ur: 30 mg/dL — AB
Specific Gravity, Urine: >=1.03 (ref 1.005–1.030)
Urobilinogen, UA: 0.2 mg/dL (ref 0.0–1.0)
pH: 7 (ref 5.0–8.0)

## 2013-08-26 MED ORDER — LABETALOL HCL 200 MG PO TABS: 400.0000 mg | ORAL_TABLET | Freq: Three times a day (TID) | ORAL | Status: DC

## 2013-08-26 NOTE — Progress Notes (Signed)
F/u US next week for growth.

## 2013-08-26 NOTE — Patient Instructions (Signed)
Third Trimester of Pregnancy  The third trimester is from week 29 through week 42, months 7 through 9. The third trimester is a time when the fetus is growing rapidly. At the end of the ninth month, the fetus is about 20 inches in length and weighs 6 10 pounds.   BODY CHANGES  Your body goes through many changes during pregnancy. The changes vary from woman to woman.    Your weight will continue to increase. You can expect to gain 25 35 pounds (11 16 kg) by the end of the pregnancy.   You may begin to get stretch marks on your hips, abdomen, and breasts.   You may urinate more often because the fetus is moving lower into your pelvis and pressing on your bladder.   You may develop or continue to have heartburn as a result of your pregnancy.   You may develop constipation because certain hormones are causing the muscles that push waste through your intestines to slow down.   You may develop hemorrhoids or swollen, bulging veins (varicose veins).   You may have pelvic pain because of the weight gain and pregnancy hormones relaxing your joints between the bones in your pelvis. Back aches may result from over exertion of the muscles supporting your posture.   Your breasts will continue to grow and be tender. A yellow discharge may leak from your breasts called colostrum.   Your belly button may stick out.   You may feel short of breath because of your expanding uterus.   You may notice the fetus "dropping," or moving lower in your abdomen.   You may have a bloody mucus discharge. This usually occurs a few days to a week before labor begins.   Your cervix becomes thin and soft (effaced) near your due date.  WHAT TO EXPECT AT YOUR PRENATAL EXAMS   You will have prenatal exams every 2 weeks until week 36. Then, you will have weekly prenatal exams. During a routine prenatal visit:   You will be weighed to make sure you and the fetus are growing normally.   Your blood pressure is taken.   Your abdomen will be  measured to track your baby's growth.   The fetal heartbeat will be listened to.   Any test results from the previous visit will be discussed.   You may have a cervical check near your due date to see if you have effaced.  At around 36 weeks, your caregiver will check your cervix. At the same time, your caregiver will also perform a test on the secretions of the vaginal tissue. This test is to determine if a type of bacteria, Group B streptococcus, is present. Your caregiver will explain this further.  Your caregiver may ask you:   What your birth plan is.   How you are feeling.   If you are feeling the baby move.   If you have had any abnormal symptoms, such as leaking fluid, bleeding, severe headaches, or abdominal cramping.   If you have any questions.  Other tests or screenings that may be performed during your third trimester include:   Blood tests that check for low iron levels (anemia).   Fetal testing to check the health, activity level, and growth of the fetus. Testing is done if you have certain medical conditions or if there are problems during the pregnancy.  FALSE LABOR  You may feel small, irregular contractions that eventually go away. These are called Braxton Hicks contractions, or   SIGNS OF LABOR   Menstrual-like cramps.  Contractions that are 5 minutes apart or less.  Contractions that start on the top of the uterus and spread down to the lower abdomen and back.  A sense of increased pelvic pressure or back pain.  A watery or bloody mucus discharge that comes from the vagina. If you have any of these signs before the 37th week of pregnancy, call your caregiver right away. You need to go to the hospital to get checked immediately. HOME CARE INSTRUCTIONS   Avoid all  smoking, herbs, alcohol, and unprescribed drugs. These chemicals affect the formation and growth of the baby.  Follow your caregiver's instructions regarding medicine use. There are medicines that are either safe or unsafe to take during pregnancy.  Exercise only as directed by your caregiver. Experiencing uterine cramps is a good sign to stop exercising.  Continue to eat regular, healthy meals.  Wear a good support bra for breast tenderness.  Do not use hot tubs, steam rooms, or saunas.  Wear your seat belt at all times when driving.  Avoid raw meat, uncooked cheese, cat litter boxes, and soil used by cats. These carry germs that can cause birth defects in the baby.  Take your prenatal vitamins.  Try taking a stool softener (if your caregiver approves) if you develop constipation. Eat more high-fiber foods, such as fresh vegetables or fruit and whole grains. Drink plenty of fluids to keep your urine clear or pale yellow.  Take warm sitz baths to soothe any pain or discomfort caused by hemorrhoids. Use hemorrhoid cream if your caregiver approves.  If you develop varicose veins, wear support hose. Elevate your feet for 15 minutes, 3 4 times a day. Limit salt in your diet.  Avoid heavy lifting, wear low heal shoes, and practice good posture.  Rest a lot with your legs elevated if you have leg cramps or low back pain.  Visit your dentist if you have not gone during your pregnancy. Use a soft toothbrush to brush your teeth and be gentle when you floss.  A sexual relationship may be continued unless your caregiver directs you otherwise.  Do not travel far distances unless it is absolutely necessary and only with the approval of your caregiver.  Take prenatal classes to understand, practice, and ask questions about the labor and delivery.  Make a trial run to the hospital.  Pack your hospital bag.  Prepare the baby's nursery.  Continue to go to all your prenatal visits as directed  by your caregiver. SEEK MEDICAL CARE IF:  You are unsure if you are in labor or if your water has broken.  You have dizziness.  You have mild pelvic cramps, pelvic pressure, or nagging pain in your abdominal area.  You have persistent nausea, vomiting, or diarrhea.  You have a bad smelling vaginal discharge.  You have pain with urination. SEEK IMMEDIATE MEDICAL CARE IF:   You have a fever.  You are leaking fluid from your vagina.  You have spotting or bleeding from your vagina.  You have severe abdominal cramping or pain.  You have rapid weight loss or gain.  You have shortness of breath with chest pain.  You notice sudden or extreme swelling of your face, hands, ankles, feet, or legs.  You have not felt your baby move in over an hour.  You have severe headaches that do not go away with medicine.  You have vision changes. Document Released: 06/04/2001 Document Revised: 02/10/2013 Document Reviewed:   You have severe abdominal cramping or pain.   You have rapid weight loss or gain.   You have shortness of breath with chest pain.   You notice sudden or extreme swelling of your face, hands, ankles, feet, or legs.   You have not felt your baby move in over an hour.   You have severe headaches that do not go away with medicine.   You have vision changes.  Document Released: 06/04/2001 Document Revised: 02/10/2013 Document Reviewed: 08/11/2012  [employer] Patient Information 2014 [employer].

## 2013-08-26 NOTE — Progress Notes (Signed)
U/S scheduled 09/01/13 at 8 am.

## 2013-08-26 NOTE — Progress Notes (Signed)
P= 84 C/o of intermittent lower abdominal/pelvic pressure. Edema in feet and hands.

## 2013-09-01 ENCOUNTER — Ambulatory Visit (HOSPITAL_COMMUNITY)
Admission: RE | Admit: 2013-09-01 | Discharge: 2013-09-01 | Disposition: A | Discharge status: Home / Self Care | Payer: Medicaid Other | Source: Ambulatory Visit | Attending: Obstetrics & Gynecology | Admitting: Obstetrics & Gynecology

## 2013-09-01 ENCOUNTER — Other Ambulatory Visit: Payer: Medicaid Other

## 2013-09-01 DIAGNOSIS — O10019 Pre-existing essential hypertension complicating pregnancy, unspecified trimester: Secondary | ICD-10-CM | POA: Insufficient documentation

## 2013-09-01 DIAGNOSIS — Z3480 Encounter for supervision of other normal pregnancy, unspecified trimester: Secondary | ICD-10-CM

## 2013-09-01 LAB — CBC
HCT: 29.5 % — ABNORMAL LOW (ref 36.0–46.0)
HEMOGLOBIN: 9.9 g/dL — AB (ref 12.0–15.0)
MCH: 30.3 pg (ref 26.0–34.0)
MCHC: 33.6 g/dL (ref 30.0–36.0)
MCV: 90.2 fL (ref 78.0–100.0)
Platelets: 251 10*3/uL (ref 150–400)
RBC: 3.27 MIL/uL — AB (ref 3.87–5.11)
RDW: 15 % (ref 11.5–15.5)
WBC: 7.5 10*3/uL (ref 4.0–10.5)

## 2013-09-02 LAB — RPR

## 2013-09-02 LAB — GLUCOSE TOLERANCE, 1 HOUR (50G) W/O FASTING: GLUCOSE 1 HOUR GTT: 90 mg/dL (ref 70–140)

## 2013-09-02 LAB — HIV ANTIBODY (ROUTINE TESTING W REFLEX): HIV: NONREACTIVE

## 2013-09-06 ENCOUNTER — Telehealth: Payer: Self-pay | Admitting: *Deleted

## 2013-09-06 NOTE — Telephone Encounter (Signed)
Pt called and stated that she thinks she is losing her mucus plug. She recently had a yeast infection. She has runny mucus coming out now. Pt denies any bleeding, contractions or decreased fetal movement. I told patient that it doesn't sound like she is losing her mucus plug based on her description. I advised her to keep an eye on things and that if she develops any contractions, bleeding or decreased fetal movement to come to MAU. Patient agrees.

## 2013-09-09 ENCOUNTER — Ambulatory Visit (INDEPENDENT_AMBULATORY_CARE_PROVIDER_SITE_OTHER): Payer: Medicaid Other | Admitting: Family

## 2013-09-09 VITALS — BP 129/84 | Temp 98.1°F | Wt 198.0 lb

## 2013-09-09 DIAGNOSIS — G5600 Carpal tunnel syndrome, unspecified upper limb: Secondary | ICD-10-CM

## 2013-09-09 DIAGNOSIS — Z9889 Other specified postprocedural states: Secondary | ICD-10-CM

## 2013-09-09 DIAGNOSIS — O10019 Pre-existing essential hypertension complicating pregnancy, unspecified trimester: Secondary | ICD-10-CM

## 2013-09-09 DIAGNOSIS — Z98891 History of uterine scar from previous surgery: Secondary | ICD-10-CM

## 2013-09-09 DIAGNOSIS — O0992 Supervision of high risk pregnancy, unspecified, second trimester: Secondary | ICD-10-CM

## 2013-09-09 DIAGNOSIS — O34219 Maternal care for unspecified type scar from previous cesarean delivery: Secondary | ICD-10-CM | POA: Insufficient documentation

## 2013-09-09 LAB — POCT URINALYSIS DIP (DEVICE)
BILIRUBIN URINE: NEGATIVE
Glucose, UA: NEGATIVE mg/dL
KETONES UR: NEGATIVE mg/dL
LEUKOCYTES UA: NEGATIVE
Nitrite: NEGATIVE
PROTEIN: NEGATIVE mg/dL
Specific Gravity, Urine: 1.025 (ref 1.005–1.030)
Urobilinogen, UA: 0.2 mg/dL (ref 0.0–1.0)
pH: 7 (ref 5.0–8.0)

## 2013-09-09 NOTE — Progress Notes (Signed)
P=95  Pt has not taken am dose of labetalol.  Denies any headache, blurred vision or epigastric pain. Considering TDap vaccine, information given.

## 2013-09-09 NOTE — Progress Notes (Signed)
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  Using wrist brace for right hand, may begin using one for left.  Reviewed growth ultrasound for 27 wks (58%ile).  Declines dTap.

## 2013-09-09 NOTE — Progress Notes (Signed)
Contact information given to patient for [facility]. ML on VM for [facility] to call her and set up a PT appointment. They were advised her order is in [place].

## 2013-09-15 ENCOUNTER — Telehealth: Payer: Self-pay | Admitting: *Deleted

## 2013-09-15 ENCOUNTER — Telehealth: Payer: Self-pay | Admitting: General Practice

## 2013-09-15 NOTE — Telephone Encounter (Signed)
Patient called and stated that she is not able to perform all the functions of her job so she will need to stop work earlier than originally planned. I advised that she would need to discuss this with a provider at her next visit. Patient voiced understanding. She had no further questions.

## 2013-09-15 NOTE — Telephone Encounter (Signed)
Patient called and left message stating she has some questions about paperwork from her doctor and has some concerns about her job and would like for [place] to call her back. Called patient, no answer- left message stating I am trying to return her phone call, please call us back at the clinics

## 2013-09-16 NOTE — Telephone Encounter (Signed)
Patient's concerns previously address in another encounter

## 2013-09-21 ENCOUNTER — Inpatient Hospital Stay (HOSPITAL_COMMUNITY)
Admission: AD | Admit: 2013-09-21 | Discharge: 2013-09-21 | Disposition: A | Discharge status: Home / Self Care | Payer: Medicaid Other | Source: Ambulatory Visit | Attending: Obstetrics & Gynecology | Admitting: Obstetrics & Gynecology

## 2013-09-21 ENCOUNTER — Encounter (HOSPITAL_COMMUNITY): Payer: Self-pay | Admitting: *Deleted

## 2013-09-21 DIAGNOSIS — B373 Candidiasis of vulva and vagina: Secondary | ICD-10-CM

## 2013-09-21 DIAGNOSIS — N390 Urinary tract infection, site not specified: Secondary | ICD-10-CM

## 2013-09-21 DIAGNOSIS — Z87891 Personal history of nicotine dependence: Secondary | ICD-10-CM | POA: Insufficient documentation

## 2013-09-21 DIAGNOSIS — R109 Unspecified abdominal pain: Secondary | ICD-10-CM | POA: Insufficient documentation

## 2013-09-21 DIAGNOSIS — F411 Generalized anxiety disorder: Secondary | ICD-10-CM | POA: Insufficient documentation

## 2013-09-21 DIAGNOSIS — O2390 Unspecified genitourinary tract infection in pregnancy, unspecified trimester: Secondary | ICD-10-CM | POA: Insufficient documentation

## 2013-09-21 DIAGNOSIS — O139 Gestational [pregnancy-induced] hypertension without significant proteinuria, unspecified trimester: Secondary | ICD-10-CM

## 2013-09-21 DIAGNOSIS — Z98891 History of uterine scar from previous surgery: Secondary | ICD-10-CM

## 2013-09-21 DIAGNOSIS — B3731 Acute candidiasis of vulva and vagina: Secondary | ICD-10-CM

## 2013-09-21 DIAGNOSIS — O2340 Unspecified infection of urinary tract in pregnancy, unspecified trimester: Secondary | ICD-10-CM

## 2013-09-21 DIAGNOSIS — O10019 Pre-existing essential hypertension complicating pregnancy, unspecified trimester: Secondary | ICD-10-CM

## 2013-09-21 DIAGNOSIS — B379 Candidiasis, unspecified: Secondary | ICD-10-CM

## 2013-09-21 HISTORY — DX: Anemia, unspecified: D64.9

## 2013-09-21 HISTORY — DX: Unspecified infectious disease: B99.9

## 2013-09-21 HISTORY — DX: Gestational (pregnancy-induced) hypertension without significant proteinuria, unspecified trimester: O13.9

## 2013-09-21 LAB — CBC
HCT: 32.1 % — ABNORMAL LOW (ref 36.0–46.0)
Hemoglobin: 10.8 g/dL — ABNORMAL LOW (ref 12.0–15.0)
MCH: 31 pg (ref 26.0–34.0)
MCHC: 33.6 g/dL (ref 30.0–36.0)
MCV: 92.2 fL (ref 78.0–100.0)
PLATELETS: 195 10*3/uL (ref 150–400)
RBC: 3.48 MIL/uL — AB (ref 3.87–5.11)
RDW: 14.4 % (ref 11.5–15.5)
WBC: 6.1 10*3/uL (ref 4.0–10.5)

## 2013-09-21 LAB — URINALYSIS, ROUTINE W REFLEX MICROSCOPIC
BILIRUBIN URINE: NEGATIVE
Glucose, UA: NEGATIVE mg/dL
KETONES UR: NEGATIVE mg/dL
NITRITE: NEGATIVE
PH: 7 (ref 5.0–8.0)
Protein, ur: NEGATIVE mg/dL
Specific Gravity, Urine: 1.02 (ref 1.005–1.030)
UROBILINOGEN UA: 0.2 mg/dL (ref 0.0–1.0)

## 2013-09-21 LAB — COMPREHENSIVE METABOLIC PANEL
ALT: 13 U/L (ref 0–35)
AST: 23 U/L (ref 0–37)
Albumin: 3.1 g/dL — ABNORMAL LOW (ref 3.5–5.2)
Alkaline Phosphatase: 64 U/L (ref 39–117)
BILIRUBIN TOTAL: 0.4 mg/dL (ref 0.3–1.2)
BUN: 5 mg/dL — ABNORMAL LOW (ref 6–23)
CHLORIDE: 101 meq/L (ref 96–112)
CO2: 23 meq/L (ref 19–32)
Calcium: 9.8 mg/dL (ref 8.4–10.5)
Creatinine, Ser: 0.5 mg/dL (ref 0.50–1.10)
GFR calc Af Amer: >90 mL/min (ref >90)
Glucose, Bld: 73 mg/dL (ref 70–99)
POTASSIUM: 3.8 meq/L (ref 3.7–5.3)
SODIUM: 136 meq/L — AB (ref 137–147)
Total Protein: 6.9 g/dL (ref 6.0–8.3)

## 2013-09-21 LAB — URINE MICROSCOPIC-ADD ON

## 2013-09-21 LAB — WET PREP, GENITAL
Clue Cells Wet Prep HPF POC: NONE SEEN
Trich, Wet Prep: NONE SEEN

## 2013-09-21 LAB — PROTEIN / CREATININE RATIO, URINE
Creatinine, Urine: 144.69 mg/dL
Protein Creatinine Ratio: 0.26 — ABNORMAL HIGH (ref 0.00–0.15)
TOTAL PROTEIN, URINE: 37.2 mg/dL

## 2013-09-21 MED ORDER — FLUCONAZOLE 150 MG PO TABS: 150.0000 mg | ORAL_TABLET | Freq: Once | ORAL | Status: DC

## 2013-09-21 MED ORDER — FLUCONAZOLE 150 MG PO TABS
150.0000 mg | ORAL_TABLET | Freq: Once | ORAL | Status: AC
  Administered 2013-09-21: 150 mg via ORAL
  Filled 2013-09-21: qty 1

## 2013-09-21 MED ORDER — GI COCKTAIL ~~LOC~~
30.0000 mL | Freq: Once | ORAL | Status: AC
  Administered 2013-09-21: 30 mL via ORAL
  Filled 2013-09-21: qty 30

## 2013-09-21 MED ORDER — NITROFURANTOIN MONOHYD MACRO 100 MG PO CAPS: 100.0000 mg | ORAL_CAPSULE | Freq: Two times a day (BID) | ORAL | Status: DC

## 2013-09-21 NOTE — MAU Provider Note (Signed)
History     CSN: [id number]  Arrival date and time: 09/21/13 5573   First Provider Initiated Contact with Patient 09/21/13 [phone]      Chief Complaint  Patient presents with  . Chest Pain  . Vaginal Itching   HPI  Pt is a 33 yo G4P2012 at [redacted]w[redacted]d wks IUP here with report of intermittent burning/pressure in chest.  Reports GI cocktail improved pain instantly last time.  Also states increased stress at work, pain in hands increasing making it difficult to work.  Desires to discontinue due to inability to perform job functions.  +vaginal itching.  Also states hit left side of abdomen on table over the weekend.  Denies vaginal bleeding or leaking of fluid.  Hurts to the touch.  Denies feeling contractions.    Pt denies headache or vision changes.  Reports recent change in blood pressure medication.    Past Medical History  Diagnosis Date  . Hypertension   . Headache(784.0)     migraines  . Chlamydia   . Pregnancy induced hypertension     with first  . Seizures     No meds, off since in mid to late 20's  . Anemia   . Infection     UTI  . Anxiety     panic attacks    Past Surgical History  Procedure Laterality Date  . Cesarean section    . Mouth surgery      Family History  Problem Relation Age of Onset  . Seizures Mother   . Hypertension Mother   . Diabetes Mother   . Heart disease Mother   . Stroke Mother   . Seizures Brother   . Hearing loss Neg Hx     History  Substance Use Topics  . Smoking status: Former Research scientist (life sciences)  . Smokeless tobacco: Never Used  . Alcohol Use: No     Comment: socially     Allergies:  Allergies  Allergen Reactions  . Amoxicillin Hives    Prescriptions prior to admission  Medication Sig Dispense Refill  . acetaminophen (TYLENOL) 325 MG tablet Take 650 mg by mouth every 6 (six) hours as needed for headache.      . Fe Fum-FePoly-FA-Vit C-Vit B3 (INTEGRA F) 125-1 MG CAPS Take 1 tablet by mouth daily.  30 capsule  1  . labetalol  (NORMODYNE) 200 MG tablet Take 2 tablets (400 mg total) by mouth 3 (three) times daily.  120 tablet  5  . omeprazole (PRILOSEC) 20 MG capsule Take 1 capsule (20 mg total) by mouth daily.  30 capsule  1  . Prenatal Vit-Fe Fumarate-FA (PRENATAL MULTIVITAMIN) TABS tablet Take 1 tablet by mouth at bedtime.         Review of Systems  Eyes: Negative for blurred vision and double vision.  Respiratory: Negative for shortness of breath.   Gastrointestinal: Positive for heartburn (none at this time - last night) and abdominal pain (left side with palpation).  Genitourinary:       Vaginal itching  Neurological: Negative for headaches.   Physical Exam   Blood pressure 150/82, pulse 81, temperature 98.7 F (37.1 C), temperature source Oral, resp. rate 18, height 5' 2.5" (1.588 m), weight 89.721 kg (197 lb 12.8 oz), last menstrual period 02/19/2013, SpO2 100.00%.  Physical Exam  Constitutional: She is oriented to person, place, and time. She appears well-developed and well-nourished. No distress.  HENT:  Head: Normocephalic.  Neck: Normal range of motion. Neck supple.  Cardiovascular: Normal rate, regular  rhythm and normal heart sounds.   Respiratory: Effort normal and breath sounds normal.  GI: Soft. There is no tenderness.  No bruising noted  Genitourinary: No bleeding around the vagina. Vaginal discharge (mucusy) found.  Neurological: She is alert and oriented to person, place, and time.  Skin: Skin is warm and dry.    MAU Course  Procedures CBC/CMP and PR/CR collected  Results for orders placed during the hospital encounter of 09/21/13 (from the past 24 hour(s))  URINALYSIS, ROUTINE W REFLEX MICROSCOPIC     Status: Abnormal   Collection Time    09/21/13  8:31 AM      Result Value Ref Range   Color, Urine YELLOW  YELLOW   APPearance HAZY (*) CLEAR   Specific Gravity, Urine 1.020  1.005 - 1.030   pH 7.0  5.0 - 8.0   Glucose, UA NEGATIVE  NEGATIVE mg/dL   Hgb urine dipstick MODERATE  (*) NEGATIVE   Bilirubin Urine NEGATIVE  NEGATIVE   Ketones, ur NEGATIVE  NEGATIVE mg/dL   Protein, ur NEGATIVE  NEGATIVE mg/dL   Urobilinogen, UA 0.2  0.0 - 1.0 mg/dL   Nitrite NEGATIVE  NEGATIVE   Leukocytes, UA MODERATE (*) NEGATIVE  URINE MICROSCOPIC-ADD ON     Status: Abnormal   Collection Time    09/21/13  8:31 AM      Result Value Ref Range   Squamous Epithelial / LPF RARE  RARE   WBC, UA 11-20  <3 WBC/hpf   RBC / HPF 11-20  <3 RBC/hpf   Bacteria, UA FEW (*) RARE   Urine-Other YEAST    WET PREP, GENITAL     Status: Abnormal   Collection Time    09/21/13  9:40 AM      Result Value Ref Range   Yeast Wet Prep HPF POC FEW (*) NONE SEEN   Trich, Wet Prep NONE SEEN  NONE SEEN   Clue Cells Wet Prep HPF POC NONE SEEN  NONE SEEN   WBC, Wet Prep HPF POC FEW (*) NONE SEEN     Assessment and Plan  33 yo [id number] at [redacted]w[redacted]d wks IUP Gestational Hypertension Yeast Infection UTI  Plan: Discharge to home CBC/CMP and PR/CR pending Bring new bottle of labetalol to clinic on Thursday (dosing sounds confusing) RX Macrobid Urine culture sent RX for Diflucan to take one week after antibiotics prn Letter given to discontinue work secondary to hand pain  [facility] 09/21/2013, 9:36 AM

## 2013-09-21 NOTE — MAU Note (Signed)
Was having  Chest pain last night, kept her awake.  Not really having them.  Had it before and during the night- was transferred to [facility]- was given Prilosec for acid reflex- is still taking that , not sure if it's related- med not working, stress related or what.  Also having vaginal itching and irritation- ? Return of yeast infection, past 2-3 days. Bumped abd into table on Sunday night- no pain.

## 2013-09-21 NOTE — MAU Provider Note (Signed)
Attestation of Attending Supervision of Advanced Practitioner (PA/CNM/NP): Evaluation and management procedures were performed by the Advanced Practitioner under my supervision and collaboration.  I have reviewed the Advanced Practitioner's note and chart, and I agree with the management and plan.  Nitya Cauthon, MD, FACOG Attending Obstetrician & Gynecologist Faculty Practice, [facility]  

## 2013-09-22 ENCOUNTER — Ambulatory Visit: Payer: Medicaid Other | Admitting: Occupational Therapy

## 2013-09-22 ENCOUNTER — Encounter: Payer: Self-pay | Admitting: Family

## 2013-09-22 LAB — URINE CULTURE: Colony Count: 40000

## 2013-09-23 ENCOUNTER — Encounter: Payer: Self-pay | Admitting: Family Medicine

## 2013-09-23 ENCOUNTER — Ambulatory Visit (INDEPENDENT_AMBULATORY_CARE_PROVIDER_SITE_OTHER): Payer: Medicaid Other | Admitting: Family Medicine

## 2013-09-23 VITALS — BP 125/77 | Temp 98.4°F

## 2013-09-23 DIAGNOSIS — O10019 Pre-existing essential hypertension complicating pregnancy, unspecified trimester: Secondary | ICD-10-CM

## 2013-09-23 DIAGNOSIS — O0992 Supervision of high risk pregnancy, unspecified, second trimester: Secondary | ICD-10-CM

## 2013-09-23 DIAGNOSIS — Z23 Encounter for immunization: Secondary | ICD-10-CM

## 2013-09-23 DIAGNOSIS — O34219 Maternal care for unspecified type scar from previous cesarean delivery: Secondary | ICD-10-CM

## 2013-09-23 DIAGNOSIS — O0990 Supervision of high risk pregnancy, unspecified, unspecified trimester: Secondary | ICD-10-CM

## 2013-09-23 LAB — POCT URINALYSIS DIP (DEVICE)
Bilirubin Urine: NEGATIVE
GLUCOSE, UA: NEGATIVE mg/dL
Ketones, ur: NEGATIVE mg/dL
Nitrite: NEGATIVE
PROTEIN: 30 mg/dL — AB
SPECIFIC GRAVITY, URINE: 1.025 (ref 1.005–1.030)
UROBILINOGEN UA: 0.2 mg/dL (ref 0.0–1.0)
pH: 7 (ref 5.0–8.0)

## 2013-09-23 MED ORDER — TETANUS-DIPHTH-ACELL PERTUSSIS 5-2.5-18.5 LF-MCG/0.5 IM SUSP: 0.5000 mL | Freq: Once | INTRAMUSCULAR | Status: DC

## 2013-09-23 MED ORDER — LABETALOL HCL 200 MG PO TABS: 400.0000 mg | ORAL_TABLET | Freq: Three times a day (TID) | ORAL | Status: DC

## 2013-09-23 NOTE — Progress Notes (Signed)
BP ok today Begin 2x/wk testing with next visit. U/s growth 3/11--shows 2 lb 9 oz 58%, nml fluid and vtx Check urine culture

## 2013-09-23 NOTE — Patient Instructions (Signed)
Preeclampsia and Eclampsia Preeclampsia is a condition of high blood pressure during pregnancy. It can happen at 20 weeks or later in pregnancy. If high blood pressure occurs in the second half of pregnancy with no other symptoms, it is called gestational hypertension and goes away after the baby is born. If any of the symptoms listed below develop with gestational hypertension, it is then called preeclampsia. Eclampsia (convulsions) may follow preeclampsia. This is one of the reasons for regular prenatal checkups. Early diagnosis and treatment are very important to prevent eclampsia. CAUSES  There is no known cause of preeclampsia/eclampsia in pregnancy. There are several known conditions that may put the pregnant woman at risk, such as:  The first pregnancy.  Having preeclampsia in a past pregnancy.  Having lasting (chronic) high blood pressure.  Having multiples (twins, triplets).  Being age 24 or older.  African American ethnic background.  Having kidney disease or diabetes.  Medical conditions such as lupus or blood diseases.  Being overweight (obese). SYMPTOMS   High blood pressure.  Headaches.  Sudden weight gain.  Swelling of hands, face, legs, and feet.  Protein in the urine.  Feeling sick to your stomach (nauseous) and throwing up (vomiting).  Vision problems (blurred or double vision).  Numbness in the face, arms, legs, and feet.  Dizziness.  Slurred speech.  Preeclampsia can cause growth retardation in the fetus.  Separation (abruption) of the placenta.  Not enough fluid in the amniotic sac (oligohydramnios).  Sensitivity to bright lights.  Belly (abdominal) pain. DIAGNOSIS  If protein is found in the urine in the second half of pregnancy, this is considered preeclampsia. Other symptoms mentioned above may also be present. TREATMENT  It is necessary to treat this.  Your caregiver may prescribe bed rest early in this condition. Plenty of rest and  salt restriction may be all that is needed.  Medicines may be necessary to lower blood pressure if the condition does not respond to more conservative measures.  In more severe cases, hospitalization may be needed:  For treatment of blood pressure.  To control fluid retention.  To monitor the baby to see if the condition is causing harm to the baby.  Hospitalization is the best way to treat the first sign of preeclampsia. This is so the mother and baby can be watched closely and blood tests can be done effectively and correctly.  If the condition becomes severe, it may be necessary to induce labor or to remove the infant by surgical means (cesarean section). The best cure for preeclampsia/eclampsia is to deliver the baby. Preeclampsia and eclampsia involve risks to mother and infant. Your caregiver will discuss these risks with you. Together, you can work out the best possible approach to your problems. Make sure you keep your prenatal visits as scheduled. Not keeping appointments could result in a chronic or permanent injury, pain, disability to you, and death or injury to you or your unborn baby. If there is any problem keeping the appointment, you must call to reschedule. HOME CARE INSTRUCTIONS   Keep your prenatal appointments and tests as scheduled.  Tell your caregiver if you have any of the above risk factors.  Get plenty of rest and sleep.  Eat a balanced diet that is low in salt, and do not add salt to your food.  Avoid stressful situations.  Only take over-the-counter and prescriptions medicines for pain, discomfort, or fever as directed by your caregiver. SEEK IMMEDIATE MEDICAL CARE IF:   You develop severe swelling  anywhere in the body. This usually occurs in the legs.  You gain 05 lb/2.3 kg or more in a week.  You develop a severe headache, dizziness, problems with your vision, or confusion.  You have abdominal pain, nausea, or vomiting.  You have a seizure.  You  have trouble moving any part of your body, or you develop numbness or problems speaking.  You have bruising or abnormal bleeding from anywhere in the body.  You develop a stiff neck.  You pass out. MAKE SURE YOU:   Understand these instructions.  Will watch your condition.  Will get help right away if you are not doing well or get worse. Document Released: 06/07/2000 Document Revised: 09/02/2011 Document Reviewed: 01/22/2008 [facility] Patient Information 2014 [place].  Breastfeeding Deciding to breastfeed is one of the best choices you can make for you and your baby. A change in hormones during pregnancy causes your breast tissue to grow and increases the number and size of your milk ducts. These hormones also allow proteins, sugars, and fats from your blood supply to make breast milk in your milk-producing glands. Hormones prevent breast milk from being released before your baby is born as well as prompt milk flow after birth. Once breastfeeding has begun, thoughts of your baby, as well as his or her sucking or crying, can stimulate the release of milk from your milk-producing glands.  BENEFITS OF BREASTFEEDING For Your Baby  Your first milk (colostrum) helps your baby's digestive system function better.   There are antibodies in your milk that help your baby fight off infections.   Your baby has a lower incidence of asthma, allergies, and sudden infant death syndrome.   The nutrients in breast milk are better for your baby than infant formulas and are designed uniquely for your baby's needs.   Breast milk improves your baby's brain development.   Your baby is less likely to develop other conditions, such as childhood obesity, asthma, or type 2 diabetes mellitus.  For You   Breastfeeding helps to create a very special bond between you and your baby.   Breastfeeding is convenient. Breast milk is always available at the correct temperature and costs nothing.    Breastfeeding helps to burn calories and helps you lose the weight gained during pregnancy.   Breastfeeding makes your uterus contract to its prepregnancy size faster and slows bleeding (lochia) after you give birth.   Breastfeeding helps to lower your risk of developing type 2 diabetes mellitus, osteoporosis, and breast or ovarian cancer later in life. SIGNS THAT YOUR BABY IS HUNGRY Early Signs of Hunger  Increased alertness or activity.  Stretching.  Movement of the head from side to side.  Movement of the head and opening of the mouth when the corner of the mouth or cheek is stroked (rooting).  Increased sucking sounds, smacking lips, cooing, sighing, or squeaking.  Hand-to-mouth movements.  Increased sucking of fingers or hands. Late Signs of Hunger  Fussing.  Intermittent crying. Extreme Signs of Hunger Signs of extreme hunger will require calming and consoling before your baby will be able to breastfeed successfully. Do not wait for the following signs of extreme hunger to occur before you initiate breastfeeding:   Restlessness.  A loud, strong cry.   Screaming. BREASTFEEDING BASICS Breastfeeding Initiation  Find a comfortable place to sit or lie down, with your neck and back well supported.  Place a pillow or rolled up blanket under your baby to bring him or her to the  level of your breast (if you are seated). Nursing pillows are specially designed to help support your arms and your baby while you breastfeed.  Make sure that your baby's abdomen is facing your abdomen.   Gently massage your breast. With your fingertips, massage from your chest wall toward your nipple in a circular motion. This encourages milk flow. You may need to continue this action during the feeding if your milk flows slowly.  Support your breast with 4 fingers underneath and your thumb above your nipple. Make sure your fingers are well away from your nipple and your baby's mouth.    Stroke your baby's lips gently with your finger or nipple.   When your baby's mouth is open wide enough, quickly bring your baby to your breast, placing your entire nipple and as much of the colored area around your nipple (areola) as possible into your baby's mouth.   More areola should be visible above your baby's upper lip than below the lower lip.   Your baby's tongue should be between his or her lower gum and your breast.   Ensure that your baby's mouth is correctly positioned around your nipple (latched). Your baby's lips should create a seal on your breast and be turned out (everted).  It is common for your baby to suck about 2 3 minutes in order to start the flow of breast milk. Latching Teaching your baby how to latch on to your breast properly is very important. An improper latch can cause nipple pain and decreased milk supply for you and poor weight gain in your baby. Also, if your baby is not latched onto your nipple properly, he or she may swallow some air during feeding. This can make your baby fussy. Burping your baby when you switch breasts during the feeding can help to get rid of the air. However, teaching your baby to latch on properly is still the best way to prevent fussiness from swallowing air while breastfeeding. Signs that your baby has successfully latched on to your nipple:    Silent tugging or silent sucking, without causing you pain.   Swallowing heard between every 3 4 sucks.    Muscle movement above and in front of his or her ears while sucking.  Signs that your baby has not successfully latched on to nipple:   Sucking sounds or smacking sounds from your baby while breastfeeding.  Nipple pain. If you think your baby has not latched on correctly, slip your finger into the corner of your baby's mouth to break the suction and place it between your baby's gums. Attempt breastfeeding initiation again. Signs of Successful Breastfeeding Signs from your  baby:   A gradual decrease in the number of sucks or complete cessation of sucking.   Falling asleep.   Relaxation of his or her body.   Retention of a small amount of milk in his or her mouth.   Letting go of your breast by himself or herself. Signs from you:  Breasts that have increased in firmness, weight, and size 1 3 hours after feeding.   Breasts that are softer immediately after breastfeeding.  Increased milk volume, as well as a change in milk consistency and color by the 5th day of breastfeeding.   Nipples that are not sore, cracked, or bleeding. Signs That Your [employer] is Getting Enough Milk  Wetting at least 3 diapers in a 24-hour period. The urine should be clear and pale yellow by age 27 days.  At least 3  stools in a 24-hour period by age 92 days. The stool should be soft and yellow.  At least 3 stools in a 24-hour period by age 93 days. The stool should be seedy and yellow.  No loss of weight greater than 10% of birth weight during the first 74 days of age.  Average weight gain of 4 7 ounces (120 210 mL) per week after age 71 days.  Consistent daily weight gain by age 921 days, without weight loss after the age of 2 weeks. After a feeding, your baby may spit up a small amount. This is common. BREASTFEEDING FREQUENCY AND DURATION Frequent feeding will help you make more milk and can prevent sore nipples and breast engorgement. Breastfeed when you feel the need to reduce the fullness of your breasts or when your baby shows signs of hunger. This is called "breastfeeding on demand." Avoid introducing a pacifier to your baby while you are working to establish breastfeeding (the first 4 6 weeks after your baby is born). After this time you may choose to use a pacifier. Research has shown that pacifier use during the first year of a baby's life decreases the risk of sudden infant death syndrome (SIDS). Allow your baby to feed on each breast as long as he or she wants.  Breastfeed until your baby is finished feeding. When your baby unlatches or falls asleep while feeding from the first breast, offer the second breast. Because newborns are often sleepy in the first few weeks of life, you may need to awaken your baby to get him or her to feed. Breastfeeding times will vary from baby to baby. However, the following rules can serve as a guide to help you ensure that your baby is properly fed:  Newborns (babies 64 weeks of age or younger) may breastfeed every 1 3 hours.  Newborns should not go longer than 3 hours during the day or 5 hours during the night without breastfeeding.  You should breastfeed your baby a minimum of 8 times in a 24-hour period until you begin to introduce solid foods to your baby at around 60 months of age. BREAST MILK PUMPING Pumping and storing breast milk allows you to ensure that your baby is exclusively fed your breast milk, even at times when you are unable to breastfeed. This is especially important if you are going back to work while you are still breastfeeding or when you are not able to be present during feedings. Your lactation consultant can give you guidelines on how long it is safe to store breast milk.  A breast pump is a machine that allows you to pump milk from your breast into a sterile bottle. The pumped breast milk can then be stored in a refrigerator or freezer. Some breast pumps are operated by hand, while others use electricity. Ask your lactation consultant which type will work best for you. Breast pumps can be purchased, but some hospitals and breastfeeding support groups lease breast pumps on a monthly basis. A lactation consultant can teach you how to hand express breast milk, if you prefer not to use a pump.  CARING FOR YOUR BREASTS WHILE YOU BREASTFEED Nipples can become dry, cracked, and sore while breastfeeding. The following recommendations can help keep your breasts moisturized and healthy:  Avoid using soap on your  nipples.   Wear a supportive bra. Although not required, special nursing bras and tank tops are designed to allow access to your breasts for breastfeeding without taking off your entire  bra or top. Avoid wearing underwire style bras or extremely tight bras.  Air dry your nipples for 3 33minutes after each feeding.   Use only cotton bra pads to absorb leaked breast milk. Leaking of breast milk between feedings is normal.   Use lanolin on your nipples after breastfeeding. Lanolin helps to maintain your skin's normal moisture barrier. If you use pure lanolin you do not need to wash it off before feeding your baby again. Pure lanolin is not toxic to your baby. You may also hand express a few drops of breast milk and gently massage that milk into your nipples and allow the milk to air dry. In the first few weeks after giving birth, some women experience extremely full breasts (engorgement). Engorgement can make your breasts feel heavy, warm, and tender to the touch. Engorgement peaks within 3 5 days after you give birth. The following recommendations can help ease engorgement:  Completely empty your breasts while breastfeeding or pumping. You may want to start by applying warm, moist heat (in the shower or with warm water-soaked hand towels) just before feeding or pumping. This increases circulation and helps the milk flow. If your baby does not completely empty your breasts while breastfeeding, pump any extra milk after he or she is finished.  Wear a snug bra (nursing or regular) or tank top for 1 2 days to signal your body to slightly decrease milk production.  Apply ice packs to your breasts, unless this is too uncomfortable for you.  Make sure that your baby is latched on and positioned properly while breastfeeding. If engorgement persists after 48 hours of following these recommendations, contact your health care provider or a Science writer. OVERALL HEALTH CARE RECOMMENDATIONS WHILE  BREASTFEEDING  Eat healthy foods. Alternate between meals and snacks, eating 3 of each per day. Because what you eat affects your breast milk, some of the foods may make your baby more irritable than usual. Avoid eating these foods if you are sure that they are negatively affecting your baby.  Drink milk, fruit juice, and water to satisfy your thirst (about 10 glasses a day).   Rest often, relax, and continue to take your prenatal vitamins to prevent fatigue, stress, and anemia.  Continue breast self-awareness checks.  Avoid chewing and smoking tobacco.  Avoid alcohol and drug use. Some medicines that may be harmful to your baby can pass through breast milk. It is important to ask your health care provider before taking any medicine, including all over-the-counter and prescription medicine as well as vitamin and herbal supplements. It is possible to become pregnant while breastfeeding. If birth control is desired, ask your health care provider about options that will be safe for your baby. SEEK MEDICAL CARE IF:   You feel like you want to stop breastfeeding or have become frustrated with breastfeeding.  You have painful breasts or nipples.  Your nipples are cracked or bleeding.  Your breasts are red, tender, or warm.  You have a swollen area on either breast.  You have a fever or chills.  You have nausea or vomiting.  You have drainage other than breast milk from your nipples.  Your breasts do not become full before feedings by the 5th day after you give birth.  You feel sad and depressed.  Your baby is too sleepy to eat well.  Your baby is having trouble sleeping.   Your baby is wetting less than 3 diapers in a 24-hour period.  Your baby has less than  3 stools in a 24-hour period.  Your baby's skin or the white part of his or her eyes becomes yellow.   Your baby is not gaining weight by 35 days of age. SEEK IMMEDIATE MEDICAL CARE IF:   Your baby is overly tired  (lethargic) and does not want to wake up and feed.  Your baby develops an unexplained fever. Document Released: 06/10/2005 Document Revised: 02/10/2013 Document Reviewed: 12/02/2012 [facility] Patient Information 2014 [place].

## 2013-09-23 NOTE — Progress Notes (Signed)
[id number]

## 2013-09-25 LAB — CULTURE, OB URINE

## 2013-10-07 ENCOUNTER — Ambulatory Visit (INDEPENDENT_AMBULATORY_CARE_PROVIDER_SITE_OTHER): Payer: Medicaid Other | Admitting: Obstetrics & Gynecology

## 2013-10-07 VITALS — BP 146/84 | Temp 98.3°F | Wt 200.1 lb

## 2013-10-07 DIAGNOSIS — Z23 Encounter for immunization: Secondary | ICD-10-CM

## 2013-10-07 DIAGNOSIS — O0992 Supervision of high risk pregnancy, unspecified, second trimester: Secondary | ICD-10-CM

## 2013-10-07 DIAGNOSIS — O10019 Pre-existing essential hypertension complicating pregnancy, unspecified trimester: Secondary | ICD-10-CM

## 2013-10-07 DIAGNOSIS — O0990 Supervision of high risk pregnancy, unspecified, unspecified trimester: Secondary | ICD-10-CM

## 2013-10-07 LAB — POCT URINALYSIS DIP (DEVICE)
Bilirubin Urine: NEGATIVE
Glucose, UA: NEGATIVE mg/dL
KETONES UR: NEGATIVE mg/dL
Nitrite: NEGATIVE
PH: 7 (ref 5.0–8.0)
Protein, ur: 30 mg/dL — AB
UROBILINOGEN UA: 0.2 mg/dL (ref 0.0–1.0)

## 2013-10-07 MED ORDER — TETANUS-DIPHTH-ACELL PERTUSSIS 5-2.5-18.5 LF-MCG/0.5 IM SUSP: 0.5000 mL | Freq: Once | INTRAMUSCULAR | Status: DC

## 2013-10-07 NOTE — Progress Notes (Signed)
VBAC consent signed and BTL papers signed. U/S scheduled 10/13/13 at 315 pm.

## 2013-10-07 NOTE — Progress Notes (Signed)
P=  C/o " weird , period like cramps in upper abdomen 2 days ago- lasted about 2 hours- none since then. Also c/o contractions several times a day.

## 2013-10-07 NOTE — Progress Notes (Signed)
Pt did not take BP meds this morning.  Pt has 1+ protein on dip (not clean catch).   RNST/ Needs VBAC consent and BTL papers signed.

## 2013-10-11 ENCOUNTER — Encounter: Payer: Self-pay | Admitting: *Deleted

## 2013-10-11 ENCOUNTER — Ambulatory Visit (INDEPENDENT_AMBULATORY_CARE_PROVIDER_SITE_OTHER): Payer: Medicaid Other | Admitting: *Deleted

## 2013-10-11 ENCOUNTER — Telehealth: Payer: Self-pay | Admitting: *Deleted

## 2013-10-11 VITALS — BP 139/83

## 2013-10-11 DIAGNOSIS — O10019 Pre-existing essential hypertension complicating pregnancy, unspecified trimester: Secondary | ICD-10-CM

## 2013-10-11 NOTE — Telephone Encounter (Signed)
Pt contacted nurse line regarding pink discharge.  Contacted patient to discuss concern.. Pt has been recently treated for UTI/yeast infection.  Informed patient of signs/symptoms to be concerned about and to let us know any other questions/concerns she may have. Pt verbalizes understanding.

## 2013-10-11 NOTE — Progress Notes (Signed)
P=[id number] 

## 2013-10-13 ENCOUNTER — Ambulatory Visit (HOSPITAL_COMMUNITY)
Admission: RE | Admit: 2013-10-13 | Discharge: 2013-10-13 | Disposition: A | Discharge status: Home / Self Care | Payer: Medicaid Other | Source: Ambulatory Visit | Attending: Obstetrics & Gynecology | Admitting: Obstetrics & Gynecology

## 2013-10-13 DIAGNOSIS — O10019 Pre-existing essential hypertension complicating pregnancy, unspecified trimester: Secondary | ICD-10-CM

## 2013-10-13 DIAGNOSIS — Z3689 Encounter for other specified antenatal screening: Secondary | ICD-10-CM | POA: Insufficient documentation

## 2013-10-14 ENCOUNTER — Ambulatory Visit (INDEPENDENT_AMBULATORY_CARE_PROVIDER_SITE_OTHER): Payer: Medicaid Other | Admitting: Obstetrics & Gynecology

## 2013-10-14 VITALS — BP 133/79 | HR 91 | Temp 98.7°F | Wt 200.2 lb

## 2013-10-14 DIAGNOSIS — O10019 Pre-existing essential hypertension complicating pregnancy, unspecified trimester: Secondary | ICD-10-CM

## 2013-10-14 DIAGNOSIS — O34219 Maternal care for unspecified type scar from previous cesarean delivery: Secondary | ICD-10-CM

## 2013-10-14 LAB — POCT URINALYSIS DIP (DEVICE)
Bilirubin Urine: NEGATIVE
Glucose, UA: NEGATIVE mg/dL
Ketones, ur: NEGATIVE mg/dL
Nitrite: NEGATIVE
PROTEIN: 30 mg/dL — AB
SPECIFIC GRAVITY, URINE: 1.025 (ref 1.005–1.030)
UROBILINOGEN UA: 0.2 mg/dL (ref 0.0–1.0)
pH: 7 (ref 5.0–8.0)

## 2013-10-14 NOTE — Progress Notes (Signed)
C/o contractions twice a day. C/o bleeding Sunday 19th - thinks it was from uti- finished meds on Friday. States noted light pink 3 times when went to bathroom only.

## 2013-10-14 NOTE — Progress Notes (Signed)
U/S scheduled 11/12/13 at 115 pm.

## 2013-10-14 NOTE — Progress Notes (Signed)
BP good today on meds.  NST reactive with baseline 130.  EFW 74% on 10/13/13.

## 2013-10-15 ENCOUNTER — Encounter: Payer: Self-pay | Admitting: *Deleted

## 2013-10-18 ENCOUNTER — Ambulatory Visit (INDEPENDENT_AMBULATORY_CARE_PROVIDER_SITE_OTHER): Payer: Medicaid Other | Admitting: *Deleted

## 2013-10-18 VITALS — BP 145/84 | HR 81

## 2013-10-18 DIAGNOSIS — O10019 Pre-existing essential hypertension complicating pregnancy, unspecified trimester: Secondary | ICD-10-CM

## 2013-10-18 LAB — CULTURE, OB URINE: Colony Count: 30000

## 2013-10-18 NOTE — Progress Notes (Signed)
NST reviewed and reactive.  

## 2013-10-18 NOTE — Progress Notes (Signed)
Pt denies H/A or visual disturbances.  

## 2013-10-21 ENCOUNTER — Ambulatory Visit (INDEPENDENT_AMBULATORY_CARE_PROVIDER_SITE_OTHER): Payer: Medicaid Other | Admitting: Family Medicine

## 2013-10-21 VITALS — BP 155/80 | HR 89 | Wt 200.1 lb

## 2013-10-21 DIAGNOSIS — O34219 Maternal care for unspecified type scar from previous cesarean delivery: Secondary | ICD-10-CM

## 2013-10-21 DIAGNOSIS — O10019 Pre-existing essential hypertension complicating pregnancy, unspecified trimester: Secondary | ICD-10-CM

## 2013-10-21 LAB — CBC
HEMATOCRIT: 32.6 % — AB (ref 36.0–46.0)
HEMOGLOBIN: 11.2 g/dL — AB (ref 12.0–15.0)
MCH: 30.6 pg (ref 26.0–34.0)
MCHC: 34.4 g/dL (ref 30.0–36.0)
MCV: 89.1 fL (ref 78.0–100.0)
Platelets: 220 10*3/uL (ref 150–400)
RBC: 3.66 MIL/uL — ABNORMAL LOW (ref 3.87–5.11)
RDW: 14.6 % (ref 11.5–15.5)
WBC: 6.6 10*3/uL (ref 4.0–10.5)

## 2013-10-21 LAB — POCT URINALYSIS DIP (DEVICE)
BILIRUBIN URINE: NEGATIVE
Glucose, UA: NEGATIVE mg/dL
KETONES UR: NEGATIVE mg/dL
LEUKOCYTES UA: NEGATIVE
Nitrite: NEGATIVE
PH: 7.5 (ref 5.0–8.0)
Protein, ur: 30 mg/dL — AB
SPECIFIC GRAVITY, URINE: 1.02 (ref 1.005–1.030)
Urobilinogen, UA: 0.2 mg/dL (ref 0.0–1.0)

## 2013-10-21 LAB — COMPREHENSIVE METABOLIC PANEL
ALK PHOS: 84 U/L (ref 39–117)
ALT: 13 U/L (ref 0–35)
AST: 20 U/L (ref 0–37)
Albumin: 3.4 g/dL — ABNORMAL LOW (ref 3.5–5.2)
BUN: 6 mg/dL (ref 6–23)
CALCIUM: 8.9 mg/dL (ref 8.4–10.5)
CHLORIDE: 105 meq/L (ref 96–112)
CO2: 21 mEq/L (ref 19–32)
Creat: 0.51 mg/dL (ref 0.50–1.10)
GLUCOSE: 88 mg/dL (ref 70–99)
POTASSIUM: 3.7 meq/L (ref 3.5–5.3)
Sodium: 135 mEq/L (ref 135–145)
Total Bilirubin: 0.5 mg/dL (ref 0.2–1.2)
Total Protein: 6.5 g/dL (ref 6.0–8.3)

## 2013-10-21 LAB — US OB FOLLOW UP

## 2013-10-21 NOTE — Progress Notes (Signed)
NST reviewed and reactive. BP are ranging up No symptoms.  Will check labs. U/S showed baby was appropriately grown with normal fluid C/o carpal tunnel--recommend wrist splints

## 2013-10-21 NOTE — Patient Instructions (Signed)
Preeclampsia and Eclampsia Preeclampsia is a condition of high blood pressure during pregnancy. It can happen at 20 weeks or later in pregnancy. If high blood pressure occurs in the second half of pregnancy with no other symptoms, it is called gestational hypertension and goes away after the baby is born. If any of the symptoms listed below develop with gestational hypertension, it is then called preeclampsia. Eclampsia (convulsions) may follow preeclampsia. This is one of the reasons for regular prenatal checkups. Early diagnosis and treatment are very important to prevent eclampsia. CAUSES  There is no known cause of preeclampsia/eclampsia in pregnancy. There are several known conditions that may put the pregnant woman at risk, such as:  The first pregnancy.  Having preeclampsia in a past pregnancy.  Having lasting (chronic) high blood pressure.  Having multiples (twins, triplets).  Being age 24 or older.  African American ethnic background.  Having kidney disease or diabetes.  Medical conditions such as lupus or blood diseases.  Being overweight (obese). SYMPTOMS   High blood pressure.  Headaches.  Sudden weight gain.  Swelling of hands, face, legs, and feet.  Protein in the urine.  Feeling sick to your stomach (nauseous) and throwing up (vomiting).  Vision problems (blurred or double vision).  Numbness in the face, arms, legs, and feet.  Dizziness.  Slurred speech.  Preeclampsia can cause growth retardation in the fetus.  Separation (abruption) of the placenta.  Not enough fluid in the amniotic sac (oligohydramnios).  Sensitivity to bright lights.  Belly (abdominal) pain. DIAGNOSIS  If protein is found in the urine in the second half of pregnancy, this is considered preeclampsia. Other symptoms mentioned above may also be present. TREATMENT  It is necessary to treat this.  Your caregiver may prescribe bed rest early in this condition. Plenty of rest and  salt restriction may be all that is needed.  Medicines may be necessary to lower blood pressure if the condition does not respond to more conservative measures.  In more severe cases, hospitalization may be needed:  For treatment of blood pressure.  To control fluid retention.  To monitor the baby to see if the condition is causing harm to the baby.  Hospitalization is the best way to treat the first sign of preeclampsia. This is so the mother and baby can be watched closely and blood tests can be done effectively and correctly.  If the condition becomes severe, it may be necessary to induce labor or to remove the infant by surgical means (cesarean section). The best cure for preeclampsia/eclampsia is to deliver the baby. Preeclampsia and eclampsia involve risks to mother and infant. Your caregiver will discuss these risks with you. Together, you can work out the best possible approach to your problems. Make sure you keep your prenatal visits as scheduled. Not keeping appointments could result in a chronic or permanent injury, pain, disability to you, and death or injury to you or your unborn baby. If there is any problem keeping the appointment, you must call to reschedule. HOME CARE INSTRUCTIONS   Keep your prenatal appointments and tests as scheduled.  Tell your caregiver if you have any of the above risk factors.  Get plenty of rest and sleep.  Eat a balanced diet that is low in salt, and do not add salt to your food.  Avoid stressful situations.  Only take over-the-counter and prescriptions medicines for pain, discomfort, or fever as directed by your caregiver. SEEK IMMEDIATE MEDICAL CARE IF:   You develop severe swelling  anywhere in the body. This usually occurs in the legs.  You gain 05 lb/2.3 kg or more in a week.  You develop a severe headache, dizziness, problems with your vision, or confusion.  You have abdominal pain, nausea, or vomiting.  You have a seizure.  You  have trouble moving any part of your body, or you develop numbness or problems speaking.  You have bruising or abnormal bleeding from anywhere in the body.  You develop a stiff neck.  You pass out. MAKE SURE YOU:   Understand these instructions.  Will watch your condition.  Will get help right away if you are not doing well or get worse. Document Released: 06/07/2000 Document Revised: 09/02/2011 Document Reviewed: 01/22/2008 [facility] Patient Information 2014 [place].  Breastfeeding Deciding to breastfeed is one of the best choices you can make for you and your baby. A change in hormones during pregnancy causes your breast tissue to grow and increases the number and size of your milk ducts. These hormones also allow proteins, sugars, and fats from your blood supply to make breast milk in your milk-producing glands. Hormones prevent breast milk from being released before your baby is born as well as prompt milk flow after birth. Once breastfeeding has begun, thoughts of your baby, as well as his or her sucking or crying, can stimulate the release of milk from your milk-producing glands.  BENEFITS OF BREASTFEEDING For Your Baby  Your first milk (colostrum) helps your baby's digestive system function better.   There are antibodies in your milk that help your baby fight off infections.   Your baby has a lower incidence of asthma, allergies, and sudden infant death syndrome.   The nutrients in breast milk are better for your baby than infant formulas and are designed uniquely for your baby's needs.   Breast milk improves your baby's brain development.   Your baby is less likely to develop other conditions, such as childhood obesity, asthma, or type 2 diabetes mellitus.  For You   Breastfeeding helps to create a very special bond between you and your baby.   Breastfeeding is convenient. Breast milk is always available at the correct temperature and costs nothing.    Breastfeeding helps to burn calories and helps you lose the weight gained during pregnancy.   Breastfeeding makes your uterus contract to its prepregnancy size faster and slows bleeding (lochia) after you give birth.   Breastfeeding helps to lower your risk of developing type 2 diabetes mellitus, osteoporosis, and breast or ovarian cancer later in life. SIGNS THAT YOUR BABY IS HUNGRY Early Signs of Hunger  Increased alertness or activity.  Stretching.  Movement of the head from side to side.  Movement of the head and opening of the mouth when the corner of the mouth or cheek is stroked (rooting).  Increased sucking sounds, smacking lips, cooing, sighing, or squeaking.  Hand-to-mouth movements.  Increased sucking of fingers or hands. Late Signs of Hunger  Fussing.  Intermittent crying. Extreme Signs of Hunger Signs of extreme hunger will require calming and consoling before your baby will be able to breastfeed successfully. Do not wait for the following signs of extreme hunger to occur before you initiate breastfeeding:   Restlessness.  A loud, strong cry.   Screaming. BREASTFEEDING BASICS Breastfeeding Initiation  Find a comfortable place to sit or lie down, with your neck and back well supported.  Place a pillow or rolled up blanket under your baby to bring him or her to the  level of your breast (if you are seated). Nursing pillows are specially designed to help support your arms and your baby while you breastfeed.  Make sure that your baby's abdomen is facing your abdomen.   Gently massage your breast. With your fingertips, massage from your chest wall toward your nipple in a circular motion. This encourages milk flow. You may need to continue this action during the feeding if your milk flows slowly.  Support your breast with 4 fingers underneath and your thumb above your nipple. Make sure your fingers are well away from your nipple and your baby's mouth.    Stroke your baby's lips gently with your finger or nipple.   When your baby's mouth is open wide enough, quickly bring your baby to your breast, placing your entire nipple and as much of the colored area around your nipple (areola) as possible into your baby's mouth.   More areola should be visible above your baby's upper lip than below the lower lip.   Your baby's tongue should be between his or her lower gum and your breast.   Ensure that your baby's mouth is correctly positioned around your nipple (latched). Your baby's lips should create a seal on your breast and be turned out (everted).  It is common for your baby to suck about 2 3 minutes in order to start the flow of breast milk. Latching Teaching your baby how to latch on to your breast properly is very important. An improper latch can cause nipple pain and decreased milk supply for you and poor weight gain in your baby. Also, if your baby is not latched onto your nipple properly, he or she may swallow some air during feeding. This can make your baby fussy. Burping your baby when you switch breasts during the feeding can help to get rid of the air. However, teaching your baby to latch on properly is still the best way to prevent fussiness from swallowing air while breastfeeding. Signs that your baby has successfully latched on to your nipple:    Silent tugging or silent sucking, without causing you pain.   Swallowing heard between every 3 4 sucks.    Muscle movement above and in front of his or her ears while sucking.  Signs that your baby has not successfully latched on to nipple:   Sucking sounds or smacking sounds from your baby while breastfeeding.  Nipple pain. If you think your baby has not latched on correctly, slip your finger into the corner of your baby's mouth to break the suction and place it between your baby's gums. Attempt breastfeeding initiation again. Signs of Successful Breastfeeding Signs from your  baby:   A gradual decrease in the number of sucks or complete cessation of sucking.   Falling asleep.   Relaxation of his or her body.   Retention of a small amount of milk in his or her mouth.   Letting go of your breast by himself or herself. Signs from you:  Breasts that have increased in firmness, weight, and size 1 3 hours after feeding.   Breasts that are softer immediately after breastfeeding.  Increased milk volume, as well as a change in milk consistency and color by the 5th day of breastfeeding.   Nipples that are not sore, cracked, or bleeding. Signs That Your [employer] is Getting Enough Milk  Wetting at least 3 diapers in a 24-hour period. The urine should be clear and pale yellow by age 27 days.  At least 3  stools in a 24-hour period by age 92 days. The stool should be soft and yellow.  At least 3 stools in a 24-hour period by age 93 days. The stool should be seedy and yellow.  No loss of weight greater than 10% of birth weight during the first 74 days of age.  Average weight gain of 4 7 ounces (120 210 mL) per week after age 71 days.  Consistent daily weight gain by age 921 days, without weight loss after the age of 2 weeks. After a feeding, your baby may spit up a small amount. This is common. BREASTFEEDING FREQUENCY AND DURATION Frequent feeding will help you make more milk and can prevent sore nipples and breast engorgement. Breastfeed when you feel the need to reduce the fullness of your breasts or when your baby shows signs of hunger. This is called "breastfeeding on demand." Avoid introducing a pacifier to your baby while you are working to establish breastfeeding (the first 4 6 weeks after your baby is born). After this time you may choose to use a pacifier. Research has shown that pacifier use during the first year of a baby's life decreases the risk of sudden infant death syndrome (SIDS). Allow your baby to feed on each breast as long as he or she wants.  Breastfeed until your baby is finished feeding. When your baby unlatches or falls asleep while feeding from the first breast, offer the second breast. Because newborns are often sleepy in the first few weeks of life, you may need to awaken your baby to get him or her to feed. Breastfeeding times will vary from baby to baby. However, the following rules can serve as a guide to help you ensure that your baby is properly fed:  Newborns (babies 64 weeks of age or younger) may breastfeed every 1 3 hours.  Newborns should not go longer than 3 hours during the day or 5 hours during the night without breastfeeding.  You should breastfeed your baby a minimum of 8 times in a 24-hour period until you begin to introduce solid foods to your baby at around 60 months of age. BREAST MILK PUMPING Pumping and storing breast milk allows you to ensure that your baby is exclusively fed your breast milk, even at times when you are unable to breastfeed. This is especially important if you are going back to work while you are still breastfeeding or when you are not able to be present during feedings. Your lactation consultant can give you guidelines on how long it is safe to store breast milk.  A breast pump is a machine that allows you to pump milk from your breast into a sterile bottle. The pumped breast milk can then be stored in a refrigerator or freezer. Some breast pumps are operated by hand, while others use electricity. Ask your lactation consultant which type will work best for you. Breast pumps can be purchased, but some hospitals and breastfeeding support groups lease breast pumps on a monthly basis. A lactation consultant can teach you how to hand express breast milk, if you prefer not to use a pump.  CARING FOR YOUR BREASTS WHILE YOU BREASTFEED Nipples can become dry, cracked, and sore while breastfeeding. The following recommendations can help keep your breasts moisturized and healthy:  Avoid using soap on your  nipples.   Wear a supportive bra. Although not required, special nursing bras and tank tops are designed to allow access to your breasts for breastfeeding without taking off your entire  bra or top. Avoid wearing underwire style bras or extremely tight bras.  Air dry your nipples for 3 28minutes after each feeding.   Use only cotton bra pads to absorb leaked breast milk. Leaking of breast milk between feedings is normal.   Use lanolin on your nipples after breastfeeding. Lanolin helps to maintain your skin's normal moisture barrier. If you use pure lanolin you do not need to wash it off before feeding your baby again. Pure lanolin is not toxic to your baby. You may also hand express a few drops of breast milk and gently massage that milk into your nipples and allow the milk to air dry. In the first few weeks after giving birth, some women experience extremely full breasts (engorgement). Engorgement can make your breasts feel heavy, warm, and tender to the touch. Engorgement peaks within 3 5 days after you give birth. The following recommendations can help ease engorgement:  Completely empty your breasts while breastfeeding or pumping. You may want to start by applying warm, moist heat (in the shower or with warm water-soaked hand towels) just before feeding or pumping. This increases circulation and helps the milk flow. If your baby does not completely empty your breasts while breastfeeding, pump any extra milk after he or she is finished.  Wear a snug bra (nursing or regular) or tank top for 1 2 days to signal your body to slightly decrease milk production.  Apply ice packs to your breasts, unless this is too uncomfortable for you.  Make sure that your baby is latched on and positioned properly while breastfeeding. If engorgement persists after 48 hours of following these recommendations, contact your health care provider or a Science writer. OVERALL HEALTH CARE RECOMMENDATIONS WHILE  BREASTFEEDING  Eat healthy foods. Alternate between meals and snacks, eating 3 of each per day. Because what you eat affects your breast milk, some of the foods may make your baby more irritable than usual. Avoid eating these foods if you are sure that they are negatively affecting your baby.  Drink milk, fruit juice, and water to satisfy your thirst (about 10 glasses a day).   Rest often, relax, and continue to take your prenatal vitamins to prevent fatigue, stress, and anemia.  Continue breast self-awareness checks.  Avoid chewing and smoking tobacco.  Avoid alcohol and drug use. Some medicines that may be harmful to your baby can pass through breast milk. It is important to ask your health care provider before taking any medicine, including all over-the-counter and prescription medicine as well as vitamin and herbal supplements. It is possible to become pregnant while breastfeeding. If birth control is desired, ask your health care provider about options that will be safe for your baby. SEEK MEDICAL CARE IF:   You feel like you want to stop breastfeeding or have become frustrated with breastfeeding.  You have painful breasts or nipples.  Your nipples are cracked or bleeding.  Your breasts are red, tender, or warm.  You have a swollen area on either breast.  You have a fever or chills.  You have nausea or vomiting.  You have drainage other than breast milk from your nipples.  Your breasts do not become full before feedings by the 5th day after you give birth.  You feel sad and depressed.  Your baby is too sleepy to eat well.  Your baby is having trouble sleeping.   Your baby is wetting less than 3 diapers in a 24-hour period.  Your baby has less than  3 stools in a 24-hour period.  Your baby's skin or the white part of his or her eyes becomes yellow.   Your baby is not gaining weight by 35 days of age. SEEK IMMEDIATE MEDICAL CARE IF:   Your baby is overly tired  (lethargic) and does not want to wake up and feed.  Your baby develops an unexplained fever. Document Released: 06/10/2005 Document Revised: 02/10/2013 Document Reviewed: 12/02/2012 [facility] Patient Information 2014 [place].

## 2013-10-22 LAB — PROTEIN / CREATININE RATIO, URINE
Creatinine, Urine: 84.7 mg/dL
PROTEIN CREATININE RATIO: 0.27 — AB (ref <0.15)
TOTAL PROTEIN, URINE: 23 mg/dL

## 2013-10-24 ENCOUNTER — Other Ambulatory Visit: Payer: Self-pay | Admitting: Obstetrics & Gynecology

## 2013-10-24 ENCOUNTER — Other Ambulatory Visit: Payer: Self-pay | Admitting: Family

## 2013-10-24 MED ORDER — NITROFURANTOIN MONOHYD MACRO 100 MG PO CAPS: 100.0000 mg | ORAL_CAPSULE | Freq: Two times a day (BID) | ORAL | Status: DC

## 2013-10-24 NOTE — Progress Notes (Signed)
Pt has enterococcus UTI.  Allergic to amoxicillin.  Sensitive to macrobid.

## 2013-10-25 ENCOUNTER — Ambulatory Visit (INDEPENDENT_AMBULATORY_CARE_PROVIDER_SITE_OTHER): Payer: Medicaid Other | Admitting: *Deleted

## 2013-10-25 VITALS — BP 166/100 | HR 78

## 2013-10-25 DIAGNOSIS — O10019 Pre-existing essential hypertension complicating pregnancy, unspecified trimester: Secondary | ICD-10-CM

## 2013-10-25 MED ORDER — LABETALOL HCL 200 MG PO TABS: ORAL_TABLET | ORAL | Status: DC

## 2013-10-25 MED ORDER — OMEPRAZOLE 20 MG PO CPDR: 20.0000 mg | DELAYED_RELEASE_CAPSULE | Freq: Every day | ORAL | Status: DC

## 2013-10-25 NOTE — Progress Notes (Signed)
Pt denies H/A or visual disturbances.  Lab results from 4/30 reviewed. Dr. Harolyn Rutherford notified of pt's clinical and fetal status. She recommended increasing Labetalol dose to 600mg  po TID.  Pt advised of this change to begin with the next dose today. We will re-evaluate at her next appt on 5/7.  Pt voiced understanding.

## 2013-10-25 NOTE — Addendum Note (Signed)
Addended by: Michel Harrow on: 10/25/2013 04:45 PM   Modules accepted: Orders

## 2013-10-25 NOTE — Progress Notes (Addendum)
Called pt and informed pt that she has a UTI and that an Rx of Macrobid has been sent to her pharmacy.  Pt then asked if she could have another Rx for prilosec looking into pt's chart Walidah was going to refill Rx but was left pending so I refilled for the patient.  Pt stated thank you.

## 2013-10-26 NOTE — Telephone Encounter (Signed)
NST not available for review.

## 2013-10-26 NOTE — Progress Notes (Signed)
If BP is still severe range next visit or has any other severe features, patient may need further inpatient evaluation/delivery. NST performed today was reviewed and was found to be reactive.  Continue recommended antenatal testing and prenatal care.

## 2013-10-28 ENCOUNTER — Ambulatory Visit (INDEPENDENT_AMBULATORY_CARE_PROVIDER_SITE_OTHER): Payer: Medicaid Other | Admitting: Obstetrics & Gynecology

## 2013-10-28 ENCOUNTER — Encounter (HOSPITAL_COMMUNITY): Payer: Self-pay | Admitting: *Deleted

## 2013-10-28 ENCOUNTER — Inpatient Hospital Stay (HOSPITAL_COMMUNITY)
Admission: AD | Admit: 2013-10-28 | Discharge: 2013-11-01 | DRG: 767 | Disposition: A | Discharge status: Home / Self Care | Payer: Medicaid Other | Source: Ambulatory Visit | Attending: Obstetrics & Gynecology | Admitting: Obstetrics & Gynecology

## 2013-10-28 VITALS — BP 184/102 | HR 87 | Wt 204.1 lb

## 2013-10-28 DIAGNOSIS — G40909 Epilepsy, unspecified, not intractable, without status epilepticus: Secondary | ICD-10-CM

## 2013-10-28 DIAGNOSIS — O9902 Anemia complicating childbirth: Secondary | ICD-10-CM | POA: Diagnosis present

## 2013-10-28 DIAGNOSIS — O34219 Maternal care for unspecified type scar from previous cesarean delivery: Secondary | ICD-10-CM | POA: Diagnosis present

## 2013-10-28 DIAGNOSIS — Z87891 Personal history of nicotine dependence: Secondary | ICD-10-CM

## 2013-10-28 DIAGNOSIS — Z833 Family history of diabetes mellitus: Secondary | ICD-10-CM

## 2013-10-28 DIAGNOSIS — K219 Gastro-esophageal reflux disease without esophagitis: Secondary | ICD-10-CM | POA: Diagnosis present

## 2013-10-28 DIAGNOSIS — Z6835 Body mass index (BMI) 35.0-35.9, adult: Secondary | ICD-10-CM

## 2013-10-28 DIAGNOSIS — O10019 Pre-existing essential hypertension complicating pregnancy, unspecified trimester: Secondary | ICD-10-CM

## 2013-10-28 DIAGNOSIS — Z8249 Family history of ischemic heart disease and other diseases of the circulatory system: Secondary | ICD-10-CM

## 2013-10-28 DIAGNOSIS — D649 Anemia, unspecified: Secondary | ICD-10-CM | POA: Diagnosis present

## 2013-10-28 DIAGNOSIS — IMO0002 Reserved for concepts with insufficient information to code with codable children: Secondary | ICD-10-CM

## 2013-10-28 DIAGNOSIS — O99214 Obesity complicating childbirth: Secondary | ICD-10-CM

## 2013-10-28 DIAGNOSIS — Z302 Encounter for sterilization: Secondary | ICD-10-CM

## 2013-10-28 DIAGNOSIS — O1494 Unspecified pre-eclampsia, complicating childbirth: Secondary | ICD-10-CM | POA: Diagnosis present

## 2013-10-28 DIAGNOSIS — O1410 Severe pre-eclampsia, unspecified trimester: Secondary | ICD-10-CM | POA: Diagnosis present

## 2013-10-28 DIAGNOSIS — Z87898 Personal history of other specified conditions: Secondary | ICD-10-CM | POA: Insufficient documentation

## 2013-10-28 DIAGNOSIS — E669 Obesity, unspecified: Secondary | ICD-10-CM | POA: Diagnosis present

## 2013-10-28 DIAGNOSIS — Z823 Family history of stroke: Secondary | ICD-10-CM

## 2013-10-28 DIAGNOSIS — O0940 Supervision of pregnancy with grand multiparity, unspecified trimester: Secondary | ICD-10-CM

## 2013-10-28 DIAGNOSIS — I10 Essential (primary) hypertension: Secondary | ICD-10-CM | POA: Diagnosis present

## 2013-10-28 HISTORY — DX: History of uterine scar from previous surgery: Z98.891

## 2013-10-28 LAB — POCT URINALYSIS DIP (DEVICE)
BILIRUBIN URINE: NEGATIVE
Glucose, UA: NEGATIVE mg/dL
KETONES UR: NEGATIVE mg/dL
Leukocytes, UA: NEGATIVE
Nitrite: NEGATIVE
Protein, ur: 30 mg/dL — AB
Specific Gravity, Urine: 1.02 (ref 1.005–1.030)
Urobilinogen, UA: 0.2 mg/dL (ref 0.0–1.0)
pH: 7 (ref 5.0–8.0)

## 2013-10-28 LAB — CBC
HCT: 31.6 % — ABNORMAL LOW (ref 36.0–46.0)
HEMOGLOBIN: 10.8 g/dL — AB (ref 12.0–15.0)
MCH: 31 pg (ref 26.0–34.0)
MCHC: 34.2 g/dL (ref 30.0–36.0)
MCV: 90.8 fL (ref 78.0–100.0)
Platelets: 179 10*3/uL (ref 150–400)
RBC: 3.48 MIL/uL — AB (ref 3.87–5.11)
RDW: 14.2 % (ref 11.5–15.5)
WBC: 6.7 10*3/uL (ref 4.0–10.5)

## 2013-10-28 LAB — COMPREHENSIVE METABOLIC PANEL
ALBUMIN: 3.2 g/dL — AB (ref 3.5–5.2)
ALT: 13 U/L (ref 0–35)
AST: 23 U/L (ref 0–37)
Alkaline Phosphatase: 90 U/L (ref 39–117)
BUN: 6 mg/dL (ref 6–23)
CALCIUM: 9.4 mg/dL (ref 8.4–10.5)
CO2: 20 mEq/L (ref 19–32)
Chloride: 103 mEq/L (ref 96–112)
Creatinine, Ser: 0.6 mg/dL (ref 0.50–1.10)
GFR calc non Af Amer: >90 mL/min (ref >90)
GLUCOSE: 75 mg/dL (ref 70–99)
Potassium: 3.9 mEq/L (ref 3.7–5.3)
Sodium: 137 mEq/L (ref 137–147)
Total Bilirubin: 0.4 mg/dL (ref 0.3–1.2)
Total Protein: 7.1 g/dL (ref 6.0–8.3)

## 2013-10-28 LAB — PROTEIN / CREATININE RATIO, URINE
Creatinine, Urine: 64.66 mg/dL
Protein Creatinine Ratio: 0.36 — ABNORMAL HIGH (ref 0.00–0.15)
Total Protein, Urine: 23.1 mg/dL

## 2013-10-28 LAB — US OB FOLLOW UP

## 2013-10-28 LAB — RPR

## 2013-10-28 LAB — TYPE AND SCREEN
ABO/RH(D): O POS
ANTIBODY SCREEN: NEGATIVE

## 2013-10-28 LAB — OB RESULTS CONSOLE GBS: GBS: NEGATIVE

## 2013-10-28 LAB — GROUP B STREP BY PCR: Group B strep by PCR: NEGATIVE

## 2013-10-28 MED ORDER — EPHEDRINE 5 MG/ML INJ
10.0000 mg | INTRAVENOUS | Status: DC | PRN
  Filled 2013-10-28: qty 2

## 2013-10-28 MED ORDER — OXYCODONE-ACETAMINOPHEN 5-325 MG PO TABS: 1.0000 | ORAL_TABLET | ORAL | Status: DC | PRN

## 2013-10-28 MED ORDER — ONDANSETRON HCL 4 MG/2ML IJ SOLN
4.0000 mg | Freq: Four times a day (QID) | INTRAMUSCULAR | Status: DC | PRN
  Filled 2013-10-28: qty 2

## 2013-10-28 MED ORDER — IBUPROFEN 600 MG PO TABS: 600.0000 mg | ORAL_TABLET | Freq: Four times a day (QID) | ORAL | Status: DC | PRN

## 2013-10-28 MED ORDER — LABETALOL HCL 300 MG PO TABS
600.0000 mg | ORAL_TABLET | Freq: Three times a day (TID) | ORAL | Status: DC
  Administered 2013-10-28 – 2013-11-01 (×11): 600 mg via ORAL
  Filled 2013-10-28 (×12): qty 2

## 2013-10-28 MED ORDER — OXYTOCIN 40 UNITS IN LACTATED RINGERS INFUSION - SIMPLE MED
62.5000 mL/h | INTRAVENOUS | Status: DC
  Administered 2013-10-29: 62.5 mL/h via INTRAVENOUS
  Filled 2013-10-28: qty 1000

## 2013-10-28 MED ORDER — LACTATED RINGERS IV SOLN: 500.0000 mL | Freq: Once | INTRAVENOUS | Status: DC

## 2013-10-28 MED ORDER — PHENYLEPHRINE 40 MCG/ML (10ML) SYRINGE FOR IV PUSH (FOR BLOOD PRESSURE SUPPORT)
80.0000 ug | PREFILLED_SYRINGE | INTRAVENOUS | Status: DC | PRN
  Filled 2013-10-28: qty 10
  Filled 2013-10-28: qty 2

## 2013-10-28 MED ORDER — TERBUTALINE SULFATE 1 MG/ML IJ SOLN: 0.2500 mg | Freq: Once | INTRAMUSCULAR | Status: AC | PRN

## 2013-10-28 MED ORDER — BUTALBITAL-APAP-CAFFEINE 50-325-40 MG PO TABS
2.0000 | ORAL_TABLET | Freq: Three times a day (TID) | ORAL | Status: DC | PRN
  Administered 2013-10-28: 2 via ORAL
  Filled 2013-10-28: qty 2

## 2013-10-28 MED ORDER — DIPHENHYDRAMINE HCL 50 MG/ML IJ SOLN: 12.5000 mg | INTRAMUSCULAR | Status: DC | PRN

## 2013-10-28 MED ORDER — LABETALOL HCL 300 MG PO TABS
600.0000 mg | ORAL_TABLET | Freq: Three times a day (TID) | ORAL | Status: DC
  Filled 2013-10-28 (×3): qty 2

## 2013-10-28 MED ORDER — LACTATED RINGERS IV SOLN
500.0000 mL | INTRAVENOUS | Status: DC | PRN
  Administered 2013-10-29: 1000 mL via INTRAVENOUS

## 2013-10-28 MED ORDER — LIDOCAINE HCL (PF) 1 % IJ SOLN
30.0000 mL | INTRAMUSCULAR | Status: DC | PRN
  Filled 2013-10-28: qty 30

## 2013-10-28 MED ORDER — OXYTOCIN 40 UNITS IN LACTATED RINGERS INFUSION - SIMPLE MED
1.0000 m[IU]/min | INTRAVENOUS | Status: DC
  Administered 2013-10-28: 2 m[IU]/min via INTRAVENOUS
  Filled 2013-10-28: qty 1000

## 2013-10-28 MED ORDER — ACETAMINOPHEN 325 MG PO TABS
650.0000 mg | ORAL_TABLET | ORAL | Status: DC | PRN
  Administered 2013-10-28: 650 mg via ORAL
  Filled 2013-10-28: qty 2

## 2013-10-28 MED ORDER — OXYTOCIN BOLUS FROM INFUSION
500.0000 mL | INTRAVENOUS | Status: DC
  Administered 2013-10-29: 500 mL via INTRAVENOUS

## 2013-10-28 MED ORDER — PHENYLEPHRINE 40 MCG/ML (10ML) SYRINGE FOR IV PUSH (FOR BLOOD PRESSURE SUPPORT)
80.0000 ug | PREFILLED_SYRINGE | INTRAVENOUS | Status: DC | PRN
  Filled 2013-10-28: qty 2

## 2013-10-28 MED ORDER — FENTANYL CITRATE 0.05 MG/ML IJ SOLN: 50.0000 ug | INTRAMUSCULAR | Status: DC | PRN

## 2013-10-28 MED ORDER — MAGNESIUM SULFATE BOLUS VIA INFUSION
4.0000 g | Freq: Once | INTRAVENOUS | Status: AC
  Administered 2013-10-28: 4 g via INTRAVENOUS
  Filled 2013-10-28: qty 500

## 2013-10-28 MED ORDER — FENTANYL 2.5 MCG/ML BUPIVACAINE 1/10 % EPIDURAL INFUSION (WH - ANES)
14.0000 mL/h | INTRAMUSCULAR | Status: DC | PRN
  Filled 2013-10-28: qty 125

## 2013-10-28 MED ORDER — CITRIC ACID-SODIUM CITRATE 334-500 MG/5ML PO SOLN
30.0000 mL | ORAL | Status: DC | PRN
  Filled 2013-10-28: qty 15

## 2013-10-28 MED ORDER — MAGNESIUM SULFATE 40 G IN LACTATED RINGERS - SIMPLE
2.0000 g/h | INTRAVENOUS | Status: DC
  Filled 2013-10-28 (×2): qty 500

## 2013-10-28 MED ORDER — EPHEDRINE 5 MG/ML INJ
10.0000 mg | INTRAVENOUS | Status: DC | PRN
  Filled 2013-10-28: qty 4
  Filled 2013-10-28: qty 2

## 2013-10-28 MED ORDER — HYDRALAZINE HCL 20 MG/ML IJ SOLN
10.0000 mg | INTRAMUSCULAR | Status: DC | PRN
  Administered 2013-10-28: 12:00:00 via INTRAVENOUS
  Administered 2013-10-29 (×3): 10 mg via INTRAVENOUS
  Filled 2013-10-28 (×3): qty 1

## 2013-10-28 MED ORDER — LACTATED RINGERS IV SOLN
INTRAVENOUS | Status: DC
  Administered 2013-10-28 – 2013-10-29 (×4): via INTRAVENOUS

## 2013-10-28 NOTE — H&P (Signed)
Mary Roberts is a 33 y.o. female [phone] with IUP at [redacted]w[redacted]d presenting directly from [place] for persistent severe range BPs, considered CHTN with superimposed Severe Pre-eclampsia, sent for admission to L&D for IOL.  Patient reports no acute symptoms at this time. She feels constant pelvic pressure, unchanged from before, with occasional infrequent contractions every few hours. Admits HA last night for few hours since resolved with rest.  Admits good Fetal Movement. Also admits b/l edema in hands, nausea. Denies any leakage of fluids, vaginal bleeding. Denies HA, vision changes / spots, RUQ abd pain, LE edema, CP, SOB.  Prenatal History/Complications: PNCare at [place] since 5 wks, dating by LMP/early [place]. Pregnancy complicated by CHTN vs GHTN (on Labetalol 600mg  PO TID) , now with new dx superimposed Severe Pre-Eclampsia (+severe range BPs, +HA), requiring delivery/IOL for TOLAC. - Last @ 33 weeks - EFW (5 lb 6 oz, 74%tile, AC-85%tile), nml AFI.  Of note - PMH with hx Epilepsy (last seizure when 33 yrs old, currently not treated with anti-epileptics)  Prior Pregnancy Hx: 1. pLTCS @ 39w d/t FTP, reported Pre-eclampsia 2. VBAC @ 40w  Past Medical History: Past Medical History  Diagnosis Date  . Hypertension   . Headache(784.0)     migraines  . Chlamydia   . Pregnancy induced hypertension     with first  . Seizures     No meds, off since in mid to late 20's  . Anemia   . Infection     UTI  . Anxiety     panic attacks    Past Surgical History: Past Surgical History  Procedure Laterality Date  . Cesarean section    . Mouth surgery      Obstetrical History: OB History   Grav Para Term Preterm Abortions TAB SAB Ect Mult Living   4 2 2  1 1    2       Social History: History   Social History  . Marital Status: Single    Spouse Name: N/A    Number of Children: N/A  . Years of Education: N/A   Social History Main Topics  . Smoking status: Former Research scientist (life sciences)  .  Smokeless tobacco: Never Used  . Alcohol Use: No     Comment: socially   . Drug Use: No  . Sexual Activity: Yes    Birth Control/ Protection: None   Other Topics Concern  . None   Social History Narrative  . None    Family History: Family History  Problem Relation Age of Onset  . Seizures Mother   . Hypertension Mother   . Diabetes Mother   . Heart disease Mother   . Stroke Mother   . Seizures Brother   . Hearing loss Neg Hx     Allergies: Allergies  Allergen Reactions  . Amoxicillin Hives    Facility-administered medications prior to admission  Medication Dose Route Frequency Provider Last Rate Last Dose  . Tdap (BOOSTRIX) injection 0.5 mL  0.5 mL Intramuscular Once Guss Bunde, MD       Prescriptions prior to admission  Medication Sig Dispense Refill  . Fe Fum-FePoly-FA-Vit C-Vit B3 (INTEGRA F) 125-1 MG CAPS Take 1 tablet by mouth daily.  30 capsule  1  . labetalol (NORMODYNE) 200 MG tablet Take 3 tablets by mouth three times daily.  270 tablet  5  . nitrofurantoin, macrocrystal-monohydrate, (MACROBID) 100 MG capsule Take 1 capsule (100 mg total) by mouth 2 (two) times daily.  14 capsule  0  . omeprazole (PRILOSEC) 20 MG capsule Take 1 capsule (20 mg total) by mouth daily.  30 capsule  1  . Prenatal Vit-Fe Fumarate-FA (MULTIVITAMIN-PRENATAL) 27-0.8 MG TABS tablet Take 1 tablet by mouth daily at 12 noon.        Review of Systems: Negative unless otherwise stated in History above  Physicial Height 5\' 2"  (1.575 m), weight 92.534 kg (204 lb), last menstrual period 02/19/2013. General appearance: alert and cooperative, comfortable, NAD Lungs: CTAB, good air movement, nml effort Heart: regular rate and rhythm Abdomen: soft, non-tender, appropriately gravid for GA, +active BS Extremities: Homans sign is negative, no sign of DVT DTR's +2 b/l patellar, no clonus  Presentation: cephalic Fetal monitoring: 135 bpm, moderate variability, accels present(15x15), no  decels Uterine activity: irregular, occasional ctx Dilation: Closed Effacement (%): Thick Exam by:: Dr Raliegh Ip  Prenatal labs: ABO, Rh: O/POS/-- (11/06 1102) Antibody: NEG (11/06 1102) Rubella:  immune RPR: NON REAC (03/11 1131)  HBsAg: NEGATIVE (11/06 1102)  HIV: NON REACTIVE (03/11 1131)  GBS:   unknown (rapid PCR pending)  GTT: 3rd trimester 90, nml  Prenatal Transfer Tool  Maternal Diabetes: No Genetic Screening: Normal Maternal Ultrasounds/Referrals: Normal Fetal Ultrasounds or other Referrals:  None Maternal Substance Abuse:  No Significant Maternal Medications:  None Significant Maternal Lab Results: Lab values include: Other: GBS pending  Results for orders placed in visit on 10/28/13 (from the past 24 hour(s))  POCT URINALYSIS DIP (DEVICE)   Collection Time    10/28/13  8:41 AM      Result Value Ref Range   Glucose, UA NEGATIVE  NEGATIVE mg/dL   Bilirubin Urine NEGATIVE  NEGATIVE   Ketones, ur NEGATIVE  NEGATIVE mg/dL   Specific Gravity, Urine 1.020  1.005 - 1.030   Hgb urine dipstick SMALL (*) NEGATIVE   pH 7.0  5.0 - 8.0   Protein, ur 30 (*) NEGATIVE mg/dL   Urobilinogen, UA 0.2  0.0 - 1.0 mg/dL   Nitrite NEGATIVE  NEGATIVE   Leukocytes, UA NEGATIVE  NEGATIVE    Assessment: Mary Roberts is a 33 y.o. [id number] at [redacted]w[redacted]d presenting for IOL (TOLAC) for Severe Pre-eclampsia (severe range BP, +HA) in setting of CHTN. Sent directly from clinic given severe range BPs, reactive NST, normal AFI today, noted to be cephalic presentation.  #Labor: IOL, unfavorable cervix (cytotec contraindicated TOLAC). Trial of foley bulb placement, if unsuccessful / inadequate, consider proceeding with repeat C/S for delivery. #CHTN / Severe Pre-eclampsia - Start Mag IV 4mg  bolus / 2mg , Continue Labetalol 600mg  PO TID, Hydralazine IV 10mg  q4hr PRN BP >160/110, repeat labs CMET / CBC / UPC #Pain: Considering IV pain medicine, planning Epidural #FWB: CAT-1 #ID: GBS unknown (ordered rapid  GBS PCR) - pending #Feeding: exclusive breastfeeding #MOC: BTL (reports papers signed) #Circ:  Yes, outpatient  [name], [place], PGY-1  10/28/2013, 11:10 AM  I spoke with and examined patient and agree with resident's note and plan of care.  Fredrik Rigger, MD OB Fellow 10/28/2013 3:23 PM

## 2013-10-28 NOTE — Progress Notes (Signed)
   NYELLE WOLFSON is a 33 y.o. [id number] at [redacted]w[redacted]d  admitted for induction of labor due to Pre-eclamptic toxemia of pregnancy..  Subjective: Feeling contractions as mild  Objective: BP 155/82  Pulse 87  Temp(Src) 98.4 F (36.9 C) (Oral)  Resp 18  Ht 5\' 2"  (1.575 m)  Wt 92.534 kg (204 lb)  BMI 37.30 kg/m2  LMP 02/19/2013 Total I/O In: 7209 [P.O.:375; I.V.:997] Out: 1100 [Urine:1100]  FHT:  FHR: 150 bpm, variability: moderate,  accelerations:  Present,  decelerations:  Absent UC:   irregular, every 2-4 minutes SVE: Cx posterior, difficult to assess because the presenting part is not in pelvis.  U/S confirms vertex presentation off to the pt's right side.  Dr. Elly Modena confirms     Pitocin @ 12 mu/min  Labs: Lab Results  Component Value Date   WBC 6.7 10/28/2013   HGB 10.8* 10/28/2013   HCT 31.6* 10/28/2013   MCV 90.8 10/28/2013   PLT 179 10/28/2013    Assessment / Plan: IOL for severe preeclampsia, early labor, TOLAC Pt turned to side Labor: early Fetal Wellbeing:  Category I Pain Control:  Labor support without medications Anticipated MOD:  NSVD  [name] 10/28/2013, 6:59 PM

## 2013-10-28 NOTE — Progress Notes (Signed)
Patient with persistent severe range BP and headaches despite the increase in her Labetalol dosage three days ago.    She meets criteria for [facility] with superimposed severe preeclampsia, and need to be delivered.  This was discussed with patient who agrees with plan.  Dr. Elly Modena, L&D Attending on call notified, L&D RN in charge also notified.  Patient will need rapid GBS done on the floor.  She desires TOLAC, history of one VBAC. History of epilepsy last seizure 7 years ago.  NST reactive, normal AFI of 9.5 cm in clinic today.  Cephalic presentation. Patient will now head to L&D for delivery.

## 2013-10-28 NOTE — Progress Notes (Signed)
JESSIECA RHEM is a 33 y.o. [id number] at [redacted]w[redacted]d by LMP/US admitted for IOL ([place]) due to [facility] with superimposed Severe Pre-eclampsia.  Subjective: Comfortable. S/p Foley Bulb placement @ 1305. Feeling increasing frequency and strength pelvic cramping with each contraction about every 8-41min. Still planning for epidural.  Objective: BP 157/90  Pulse 84  Temp(Src) 98.2 F (36.8 C) (Oral)  Resp 18  Ht 5\' 2"  (1.575 m)  Wt 92.534 kg (204 lb)  BMI 37.30 kg/m2  LMP 02/19/2013   Total I/O In: 347.1 [I.V.:347.1] Out: 450 [Urine:450]  FHT:  FHR: 135 bpm, variability: moderate,  accelerations:  Present,  decelerations:  Absent UC:   regular, every 8-10 minutes SVE:   Dilation: 1.5 Effacement (%): 20 Station: -2 Exam by:: Dr Leslie Andrea  Labs: Lab Results  Component Value Date   WBC 6.7 10/28/2013   HGB 10.8* 10/28/2013   HCT 31.6* 10/28/2013   MCV 90.8 10/28/2013   PLT 179 10/28/2013    Assessment / Plan: DENENE ALAMILLO is a 33 y.o. [id number] at [redacted]w[redacted]d by LMP/US admitted IOL ([place]) for CHTN with superimposed Severe Pre-Eclampsia (severe range BPs)  Labor: IOL TOLAC, s/p FB @ 1749 (remains in place). Continue to monitor. Preeclampsia:  on magnesium sulfate and no signs or symptoms of toxicity CHTN / Severe Pre-Eclampsia - Continue Mag IV, Labetalol 600mg  PO TID, Hydralazine IV 10mg  q 4 hr PRN (BP > 160/110). Repeat labs with elevated UPC (0.36 from 0.27), with stable CMET, CBC. No sign/sx Fetal Wellbeing:  Category I Pain Control:  planning Epidural I/D:  GBS (negative) - by rapid PCR Anticipated MOD:  NSVD (if failure to progress, would require repeat C/S).  [name], [place], PGY-1 10/28/2013, 1:45 PM

## 2013-10-28 NOTE — Progress Notes (Signed)
Pt is concerned over 4 lb weight gain in 1 week.  Pt reports feeling like a headache is coming on and is dizzy at different times during the day.  Pt reports numbness in both hands and pain.  Pt reports headache last night did not take Tylenol, went to sleep and when she woke up she up she was nausea/hungry.

## 2013-10-28 NOTE — Progress Notes (Signed)
   Mary Roberts is a 33 y.o. [id number] at [redacted]w[redacted]d  admitted for induction of labor due to Pre-eclamptic toxemia of pregnancy..  Subjective: C/O frontal HA. No visual changes.    Objective: BP 150/84  Pulse 94  Temp(Src) 99.1 F (37.3 C) (Oral)  Resp 20  Ht 5\' 2"  (1.575 m)  Wt 92.534 kg (204 lb)  BMI 37.30 kg/m2  LMP 02/19/2013 Total I/O In: 141.9 [I.V.:141.9] Out: -   Filed Vitals:   10/28/13 1922 10/28/13 2011 10/28/13 2040 10/28/13 2117  BP: 139/71 156/94 175/93 150/84  Pulse: 91 88 94 94  Temp: 99.1 F (37.3 C)     TempSrc: Oral     Resp: 18 20 20 20   Height:      Weight:        Has required hydralazine for increased B/P  FHT:  150, avg LTV, 10X10 accels, no decels  UC:   irregular, every 2-6 minutes SVE:   Dilation: 3.5 Effacement (%): 50 Station: Ballotable Exam by:: F. Cresenzo, CNM Head now lower in pelvis Pitocin @ 16 mu/min  Labs: Lab Results  Component Value Date   WBC 6.7 10/28/2013   HGB 10.8* 10/28/2013   HCT 31.6* 10/28/2013   MCV 90.8 10/28/2013   PLT 179 10/28/2013    Assessment / Plan: IOL for preeclampisa, slow;  HA Fioricet now Labor: Progressing normally Fetal Wellbeing:  Category I Pain Control:  Labor support without medications Anticipated MOD:  NSVD  [name] 10/28/2013, 9:31 PM

## 2013-10-28 NOTE — Patient Instructions (Signed)
Return to clinic for any obstetric concerns or go to [facility] for evaluation  

## 2013-10-28 NOTE — Progress Notes (Signed)
I spoke with and examined patient and agree with resident's note and plan of care.  Fredrik Rigger, MD OB Fellow 10/28/2013 3:21 PM

## 2013-10-29 ENCOUNTER — Encounter (HOSPITAL_COMMUNITY): Payer: Self-pay | Admitting: Anesthesiology

## 2013-10-29 ENCOUNTER — Encounter (HOSPITAL_COMMUNITY): Payer: Medicaid Other | Admitting: Anesthesiology

## 2013-10-29 ENCOUNTER — Inpatient Hospital Stay (HOSPITAL_COMMUNITY): Payer: Medicaid Other | Admitting: Anesthesiology

## 2013-10-29 DIAGNOSIS — O34219 Maternal care for unspecified type scar from previous cesarean delivery: Secondary | ICD-10-CM

## 2013-10-29 DIAGNOSIS — IMO0002 Reserved for concepts with insufficient information to code with codable children: Secondary | ICD-10-CM

## 2013-10-29 DIAGNOSIS — I10 Essential (primary) hypertension: Secondary | ICD-10-CM

## 2013-10-29 DIAGNOSIS — O1494 Unspecified pre-eclampsia, complicating childbirth: Secondary | ICD-10-CM | POA: Diagnosis present

## 2013-10-29 LAB — CBC
HCT: 32.6 % — ABNORMAL LOW (ref 36.0–46.0)
HEMOGLOBIN: 11.1 g/dL — AB (ref 12.0–15.0)
MCH: 30.7 pg (ref 26.0–34.0)
MCHC: 34 g/dL (ref 30.0–36.0)
MCV: 90.3 fL (ref 78.0–100.0)
PLATELETS: 174 10*3/uL (ref 150–400)
RBC: 3.61 MIL/uL — ABNORMAL LOW (ref 3.87–5.11)
RDW: 14.2 % (ref 11.5–15.5)
WBC: 8.9 10*3/uL (ref 4.0–10.5)

## 2013-10-29 MED ORDER — PRENATAL MULTIVITAMIN CH
1.0000 | ORAL_TABLET | Freq: Every day | ORAL | Status: DC
  Administered 2013-10-31 – 2013-11-01 (×2): 1 via ORAL
  Filled 2013-10-29 (×2): qty 1

## 2013-10-29 MED ORDER — BENZOCAINE-MENTHOL 20-0.5 % EX AERO: 1.0000 "application " | INHALATION_SPRAY | CUTANEOUS | Status: DC | PRN

## 2013-10-29 MED ORDER — IBUPROFEN 600 MG PO TABS
600.0000 mg | ORAL_TABLET | Freq: Four times a day (QID) | ORAL | Status: DC
  Administered 2013-10-30 – 2013-11-01 (×10): 600 mg via ORAL
  Filled 2013-10-29 (×10): qty 1

## 2013-10-29 MED ORDER — ZOLPIDEM TARTRATE 5 MG PO TABS: 5.0000 mg | ORAL_TABLET | Freq: Every evening | ORAL | Status: DC | PRN

## 2013-10-29 MED ORDER — ONDANSETRON HCL 4 MG/2ML IJ SOLN: 4.0000 mg | INTRAMUSCULAR | Status: DC | PRN

## 2013-10-29 MED ORDER — FENTANYL 2.5 MCG/ML BUPIVACAINE 1/10 % EPIDURAL INFUSION (WH - ANES)
INTRAMUSCULAR | Status: DC | PRN
  Administered 2013-10-29: 12.5 mL/h via EPIDURAL

## 2013-10-29 MED ORDER — OXYCODONE-ACETAMINOPHEN 5-325 MG PO TABS
1.0000 | ORAL_TABLET | ORAL | Status: DC | PRN
  Administered 2013-10-30: 1 via ORAL
  Administered 2013-10-30: 2 via ORAL
  Administered 2013-10-30 (×3): 1 via ORAL
  Administered 2013-10-31 – 2013-11-01 (×8): 2 via ORAL
  Filled 2013-10-29 (×2): qty 2
  Filled 2013-10-29: qty 1
  Filled 2013-10-29: qty 2
  Filled 2013-10-29: qty 1
  Filled 2013-10-29 (×4): qty 2
  Filled 2013-10-29 (×2): qty 1
  Filled 2013-10-29 (×2): qty 2

## 2013-10-29 MED ORDER — LANOLIN HYDROUS EX OINT: TOPICAL_OINTMENT | CUTANEOUS | Status: DC | PRN

## 2013-10-29 MED ORDER — MAGNESIUM SULFATE 40 G IN LACTATED RINGERS - SIMPLE
2.0000 g/h | INTRAVENOUS | Status: AC
  Administered 2013-10-30: 2 g/h via INTRAVENOUS
  Filled 2013-10-29 (×2): qty 500

## 2013-10-29 MED ORDER — LACTATED RINGERS IV SOLN
INTRAVENOUS | Status: DC
Start: 2013-10-29 — End: 2013-11-01
  Administered 2013-10-30: 12:00:00 via INTRAVENOUS
  Administered 2013-10-30: 100 mL/h via INTRAVENOUS
  Administered 2013-10-30: 02:00:00 via INTRAVENOUS

## 2013-10-29 MED ORDER — DIPHENHYDRAMINE HCL 25 MG PO CAPS: 25.0000 mg | ORAL_CAPSULE | Freq: Four times a day (QID) | ORAL | Status: DC | PRN

## 2013-10-29 MED ORDER — DIBUCAINE 1 % RE OINT: 1.0000 "application " | TOPICAL_OINTMENT | RECTAL | Status: DC | PRN

## 2013-10-29 MED ORDER — WITCH HAZEL-GLYCERIN EX PADS: 1.0000 "application " | MEDICATED_PAD | CUTANEOUS | Status: DC | PRN

## 2013-10-29 MED ORDER — SENNOSIDES-DOCUSATE SODIUM 8.6-50 MG PO TABS
2.0000 | ORAL_TABLET | ORAL | Status: DC
  Administered 2013-10-30 – 2013-10-31 (×3): 2 via ORAL
  Filled 2013-10-29 (×3): qty 2

## 2013-10-29 MED ORDER — ONDANSETRON HCL 4 MG PO TABS: 4.0000 mg | ORAL_TABLET | ORAL | Status: DC | PRN

## 2013-10-29 MED ORDER — TETANUS-DIPHTH-ACELL PERTUSSIS 5-2.5-18.5 LF-MCG/0.5 IM SUSP
0.5000 mL | Freq: Once | INTRAMUSCULAR | Status: DC
  Filled 2013-10-29: qty 0.5

## 2013-10-29 MED ORDER — LIDOCAINE HCL (PF) 1 % IJ SOLN
INTRAMUSCULAR | Status: DC | PRN
  Administered 2013-10-29 (×2): 4 mL

## 2013-10-29 MED ORDER — OXYTOCIN 40 UNITS IN LACTATED RINGERS INFUSION - SIMPLE MED
1.0000 m[IU]/min | INTRAVENOUS | Status: DC
  Administered 2013-10-29: 20 m[IU]/min via INTRAVENOUS

## 2013-10-29 MED ORDER — SIMETHICONE 80 MG PO CHEW
80.0000 mg | CHEWABLE_TABLET | ORAL | Status: DC | PRN
  Administered 2013-10-31 (×2): 80 mg via ORAL
  Filled 2013-10-29: qty 1

## 2013-10-29 MED ORDER — TERBUTALINE SULFATE 1 MG/ML IJ SOLN: 0.2500 mg | Freq: Once | INTRAMUSCULAR | Status: DC | PRN

## 2013-10-29 NOTE — H&P (Signed)
Attestation of Attending Supervision of Advanced Practitioner (CNM/NP): Evaluation and management procedures were performed by the Advanced Practitioner under my supervision and collaboration.  I have reviewed the Advanced Practitioner's note and chart, and I agree with the management and plan.  Delight Bickle 10/29/2013 5:59 AM

## 2013-10-29 NOTE — Progress Notes (Signed)
   Mary Roberts is a 33 y.o. [id number] at [redacted]w[redacted]d  admitted for IOL for [facility] with superimposed Severe Pre-eclampsia  Subjective: Uncomfortable with increasing pelvic pressure, no pain s/p epidural.  Objective: Filed Vitals:   10/29/13 1707 10/29/13 1803 10/29/13 1832 10/29/13 1843  BP:  163/86 161/91 161/91  Pulse: 89 80 79   Temp:  99.2 F (37.3 C)    TempSrc:  Oral    Resp:  18 18   Height:      Weight:      SpO2: 100%      FHT:  Baseline 120s, moderate variability, no accels, +early decels with x 1 prolonged. UC:  Regular UCs q 2-3 min, adequate >200 MVUs > 1 hour  Dilation: 8 Effacement (%): 100 Cervical Position: Middle Station: -2;-1 Presentation: Vertex Exam by:: Olena Heckle  Pitocin 22 mU/min MgSO4 at 2gm/hr Labs: Lab Results  Component Value Date   WBC 8.9 10/29/2013   HGB 11.1* 10/29/2013   HCT 32.6* 10/29/2013   MCV 90.3 10/29/2013   PLT 174 10/29/2013    Assessment / Plan:  33 y.o. [id number] at [redacted]w[redacted]d  admitted for IOL ([place]) for CHTN with superimposed Severe Pre-Eclampsia.  Labor: IOL TOLAC, s/p FB, Pit rest x 1, s/p AROM 1702 (clear), IUPC, Pit up to 12mU/min, with adequate ctx, making good progress, continue to monitor Fetal Wellbeing:  Category I - early decels (likely d/t rapid descent) Preeclampsia: BP stable (non-severe range), continue Labetalol PO and Hydral IV PRN, Continue MgSO4 2g/h Pain Control:  Labor support without medications Anticipated MOD:  NSVD  [name], [place], PGY-1 10/29/2013, 7:33 PM

## 2013-10-29 NOTE — Progress Notes (Signed)
Mary Roberts is a 33 y.o. [id number] at [redacted]w[redacted]d admitted for induction of labor due to severe pre-E.  Subjective: Just got epidural. Feeling better. S/p pit rest.  +FM.    Objective: BP 157/98  Pulse 89  Temp(Src) 99.2 F (37.3 C) (Oral)  Resp 18  Ht 5\' 2"  (1.575 m)  Wt 92.534 kg (204 lb)  BMI 37.30 kg/m2  SpO2 100%  LMP 02/19/2013 I/O last 3 completed shifts: In: 3277.3 [P.O.:693; I.V.:2584.3] Out: 1100 [Urine:1100] Total I/O In: 1362.2 [P.O.:480; I.V.:882.2] Out: 1450 [Urine:1450]  FHT:  FHR: 120 bpm, variability: moderate,  accelerations:  Present,  decelerations:  Absent UC:   irregular, every 4-7 minutes SVE:   Dilation: 4.5 Effacement (%): 70 Station: -2 Exam by:: [employer] RN  Labs: Lab Results  Component Value Date   WBC 8.9 10/29/2013   HGB 11.1* 10/29/2013   HCT 32.6* 10/29/2013   MCV 90.3 10/29/2013   PLT 174 10/29/2013    Assessment / Plan: IOL due to severe pre-E  Labor: progressing on pit. AROM performed with return of clear fluid. IUPC placed.   Preeclampsia:  some severe range pressures- treated with hydralazine and now improved. labs stable Fetal Wellbeing:  Category I Pain Control:  Epidural I/D:  n/a Anticipated MOD:  NSVD  Mary Roberts Mary Roberts 10/29/2013, 5:09 PM

## 2013-10-29 NOTE — Progress Notes (Signed)
System downtime. Blood pressures taken, and assessed but did not save in computer. Bp 130/80s throughout recovery.

## 2013-10-29 NOTE — Progress Notes (Signed)
   Mary Roberts is a 33 y.o. [id number] at [redacted]w[redacted]d  admitted for induction of labor due to Pre-eclamptic toxemia of pregnancy..  Subjective: Sleeping through contractions.    Objective: Filed Vitals:   10/28/13 2232 10/28/13 2313 10/28/13 2350 10/29/13 0036  BP: 145/77 125/82 132/78 149/83  Pulse: 93 87 92 88  Temp:   99 F (37.2 C)   TempSrc:   Oral   Resp: 18 18 18 18   Height:      Weight:       Total I/O In: 808.1 [P.O.:150; I.V.:658.1] Out: -   FHT:  FHR: 150 bpm, variability: moderate,  accelerations:  Present,  decelerations:  Absent UC:   irregular, every 3-5 minutes SVE:   Dilation: 3.5 Effacement (%): Thick Station: Ballotable Exam by:: F Cresenzo, CNM Pitocin @ 20 mu/min MgSO4 at 2gm/hr Labs: Lab Results  Component Value Date   WBC 6.7 10/28/2013   HGB 10.8* 10/28/2013   HCT 31.6* 10/28/2013   MCV 90.8 10/28/2013   PLT 179 10/28/2013    Assessment / Plan: IOL for preeclampisa, slow to respond to pitocin May increase up to 30 mu/min prn Labor: early Fetal Wellbeing:  Category I Pain Control:  Labor support without medications Anticipated MOD:  NSVD  Mary Roberts 10/29/2013, 1:05 AM

## 2013-10-29 NOTE — Progress Notes (Signed)
   Mary Roberts is a 33 y.o. [id number] at [redacted]w[redacted]d  admitted for IOL for [facility] with superimposed Severe Pre-eclampsia  Subjective: Patient able to shower, eat, and rest off of Pitocin currently. Reports not feeling any significant pelvic pressure or regular contractions. Comfortable.  Objective: Filed Vitals:   10/29/13 0811 10/29/13 0907 10/29/13 0909 10/29/13 0944  BP: 160/89 177/92 177/92 153/80  Pulse: 86 96  99  Temp:      TempSrc:      Resp: 18 18  18   Height:      Weight:       FHT:  Baseline 130, moderate variability, accels present (15x15), no decels. UC:  Irregular, occasional ctx  Dilation: 3 Effacement (%): 50 Cervical Position: Posterior Station: Ballotable Presentation: Vertex Exam by:: Carter Kitten RN  Pitocin 11mU/min MgSO4 at 2gm/hr Labs: Lab Results  Component Value Date   WBC 6.7 10/28/2013   HGB 10.8* 10/28/2013   HCT 31.6* 10/28/2013   MCV 90.8 10/28/2013   PLT 179 10/28/2013    Assessment / Plan:  34 y.o. [name] at [redacted]w[redacted]d  admitted for IOL ([place]) for CHTN with superimposed Severe Pre-Eclampsia.  Labor: IOL TOLAC, s/p FB, titrated Pitocin up 55mU/min. Currently on Pitocin rest, may shower, eat, rest. Restart Pitocin 2/2, continue to titrate. Baby still poorly engaged Fetal Wellbeing:  Category I Preeclampsia: BP stable (non-severe range), continue Labetalol PO and Hydral IV PRN, Continue MgSO4 2g/h Pain Control:  Labor support without medications Anticipated MOD:  NSVD  [name], [place], PGY-1 10/29/2013, 10:49 AM

## 2013-10-29 NOTE — Progress Notes (Signed)
   Mary Roberts is a 33 y.o. [id number] at [redacted]w[redacted]d  admitted for induction of labor due to Pre-eclamptic toxemia of pregnancy..  Subjective: Has been resting comfortably. Now awake, not really feeling contractions.   Objective: Filed Vitals:   10/29/13 0325 10/29/13 0400 10/29/13 0428 10/29/13 0515  BP: 144/79 142/77 144/77 154/81  Pulse: 85 85 82 81  Temp:    99.2 F (37.3 C)  TempSrc:    Oral  Resp: 18 18 18 18   Height:      Weight:       Urine output 152ml/h  FHT:  Baseline 120s, + accels, moderate variability, no decels. Category I tracing UC:   every 2-3 minutes SVE:  4cm/60%/-3  Pitocin @ 28 mu/min MgSO4 at 2gm/hr Labs: Lab Results  Component Value Date   WBC 6.7 10/28/2013   HGB 10.8* 10/28/2013   HCT 31.6* 10/28/2013   MCV 90.8 10/28/2013   PLT 179 10/28/2013    Assessment / Plan:  33 y.o. [id number] at [redacted]w[redacted]d  admitted for induction of labor due to Pre-eclamptic toxemia of pregnancy.  Labor: Induction with Pitocin, now on 31mU/min Fetal Wellbeing:  Category I Preeclampsia: BP stable on labatolol, good urine output, MgSO4 2g/h Pain Control:  Labor support without medications Anticipated MOD:  NSVD  Mossie Gilder H Jlynn Langille 10/29/2013, 5:54 AM

## 2013-10-29 NOTE — Anesthesia Preprocedure Evaluation (Signed)
Anesthesia Evaluation  Patient identified by MRN, date of birth, ID band Patient awake    Reviewed: Allergy & Precautions, H&P , Patient's Chart, lab work & pertinent test results  Airway Mallampati: III TM Distance: >3 FB Neck ROM: Full    Dental no notable dental hx. (+) Teeth Intact   Pulmonary former smoker,  breath sounds clear to auscultation  Pulmonary exam normal       Cardiovascular hypertension, Pt. on medications and Pt. on home beta blockers Rhythm:Regular Rate:Normal  PIH on MgSO4   Neuro/Psych  Headaches, Seizures -, Well Controlled,  Anxiety    GI/Hepatic Neg liver ROS, GERD-  Medicated and Controlled,  Endo/Other  Obesity  Renal/GU negative Renal ROS  negative genitourinary   Musculoskeletal negative musculoskeletal ROS (+)   Abdominal (+) + obese,   Peds  Hematology  (+) anemia ,   Anesthesia Other Findings   Reproductive/Obstetrics (+) Pregnancy Previous C/Section Successful VBAC PIH superimposed on cHTN                           Anesthesia Physical Anesthesia Plan  ASA: III  Anesthesia Plan: Epidural   Post-op Pain Management:    Induction:   Airway Management Planned: Natural Airway  Additional Equipment:   Intra-op Plan:   Post-operative Plan:   Informed Consent: I have reviewed the patients History and Physical, chart, labs and discussed the procedure including the risks, benefits and alternatives for the proposed anesthesia with the patient or authorized representative who has indicated his/her understanding and acceptance.     Plan Discussed with: Anesthesiologist  Anesthesia Plan Comments:         Anesthesia Quick Evaluation

## 2013-10-29 NOTE — Anesthesia Procedure Notes (Signed)
Epidural Patient location during procedure: OB Start time: 10/29/2013 4:33 PM  Staffing Anesthesiologist: Ainhoa Rallo A. Performed by: anesthesiologist   Preanesthetic Checklist Completed: patient identified, site marked, surgical consent, pre-op evaluation, timeout performed, IV checked, risks and benefits discussed and monitors and equipment checked  Epidural Patient position: sitting Prep: site prepped and draped and DuraPrep Patient monitoring: continuous pulse ox and blood pressure Approach: midline Location: L3-L4 Injection technique: LOR air  Needle:  Needle type: Tuohy  Needle gauge: 17 G Needle length: 9 cm and 9 Needle insertion depth: 6 cm Catheter type: closed end flexible Catheter size: 19 Gauge Catheter at skin depth: 11 cm Test dose: negative and Other  Assessment Events: blood not aspirated, injection not painful, no injection resistance, negative IV test and no paresthesia  Additional Notes Patient identified. Risks and benefits discussed including failed block, incomplete  Pain control, post dural puncture headache, nerve damage, paralysis, blood pressure Changes, nausea, vomiting, reactions to medications-both toxic and allergic and post Partum back pain. All questions were answered. Patient expressed understanding and wished to proceed. Sterile technique was used throughout procedure. Epidural site was Dressed with sterile barrier dressing. No paresthesias, signs of intravascular injection Or signs of intrathecal spread were encountered.  Patient was more comfortable after the epidural was dosed. Please see RN's note for documentation of vital signs and FHR which are stable.

## 2013-10-30 ENCOUNTER — Encounter (HOSPITAL_COMMUNITY): Payer: Self-pay | Admitting: *Deleted

## 2013-10-30 ENCOUNTER — Encounter (HOSPITAL_COMMUNITY): Payer: Medicaid Other | Admitting: Anesthesiology

## 2013-10-30 ENCOUNTER — Encounter (HOSPITAL_COMMUNITY)
Admission: AD | Disposition: A | Discharge status: Home / Self Care | Payer: Self-pay | Source: Ambulatory Visit | Attending: Obstetrics & Gynecology

## 2013-10-30 ENCOUNTER — Inpatient Hospital Stay (HOSPITAL_COMMUNITY): Payer: Medicaid Other | Admitting: Anesthesiology

## 2013-10-30 DIAGNOSIS — Z302 Encounter for sterilization: Secondary | ICD-10-CM

## 2013-10-30 HISTORY — PX: TUBAL LIGATION: SHX77

## 2013-10-30 LAB — CBC
HCT: 26.5 % — ABNORMAL LOW (ref 36.0–46.0)
HEMATOCRIT: 27.7 % — AB (ref 36.0–46.0)
HEMOGLOBIN: 9.3 g/dL — AB (ref 12.0–15.0)
Hemoglobin: 9 g/dL — ABNORMAL LOW (ref 12.0–15.0)
MCH: 30.4 pg (ref 26.0–34.0)
MCH: 31 pg (ref 26.0–34.0)
MCHC: 33.6 g/dL (ref 30.0–36.0)
MCHC: 34 g/dL (ref 30.0–36.0)
MCV: 90.5 fL (ref 78.0–100.0)
MCV: 91.4 fL (ref 78.0–100.0)
PLATELETS: 158 10*3/uL (ref 150–400)
Platelets: 160 10*3/uL (ref 150–400)
RBC: 3.06 MIL/uL — ABNORMAL LOW (ref 3.87–5.11)
RBC: 2.9 MIL/uL — ABNORMAL LOW (ref 3.87–5.11)
RDW: 14.4 % (ref 11.5–15.5)
RDW: 14.7 % (ref 11.5–15.5)
WBC: 12.1 10*3/uL — AB (ref 4.0–10.5)
WBC: 10.7 10*3/uL — AB (ref 4.0–10.5)

## 2013-10-30 LAB — MRSA PCR SCREENING: MRSA by PCR: NEGATIVE

## 2013-10-30 SURGERY — LIGATION, FALLOPIAN TUBE, POSTPARTUM
Anesthesia: Epidural | Site: Abdomen

## 2013-10-30 MED ORDER — LIDOCAINE-EPINEPHRINE (PF) 2 %-1:200000 IJ SOLN
INTRAMUSCULAR | Status: DC | PRN
  Administered 2013-10-30 (×4): 5 mL via EPIDURAL

## 2013-10-30 MED ORDER — MEPERIDINE HCL 25 MG/ML IJ SOLN: 6.2500 mg | INTRAMUSCULAR | Status: DC | PRN

## 2013-10-30 MED ORDER — BUPIVACAINE HCL (PF) 0.25 % IJ SOLN
INTRAMUSCULAR | Status: AC
  Filled 2013-10-30: qty 30

## 2013-10-30 MED ORDER — FAMOTIDINE 20 MG PO TABS
40.0000 mg | ORAL_TABLET | Freq: Once | ORAL | Status: AC
  Administered 2013-10-30: 40 mg via ORAL
  Filled 2013-10-30: qty 2

## 2013-10-30 MED ORDER — KETOROLAC TROMETHAMINE 30 MG/ML IJ SOLN: 15.0000 mg | Freq: Once | INTRAMUSCULAR | Status: DC | PRN

## 2013-10-30 MED ORDER — SODIUM BICARBONATE 8.4 % IV SOLN
INTRAVENOUS | Status: AC
  Filled 2013-10-30: qty 50

## 2013-10-30 MED ORDER — METOCLOPRAMIDE HCL 10 MG PO TABS
10.0000 mg | ORAL_TABLET | Freq: Once | ORAL | Status: AC
  Administered 2013-10-30: 10 mg via ORAL
  Filled 2013-10-30: qty 1

## 2013-10-30 MED ORDER — LIDOCAINE IN DEXTROSE 5-7.5 % IV SOLN
INTRAVENOUS | Status: AC
  Filled 2013-10-30: qty 2

## 2013-10-30 MED ORDER — MIDAZOLAM HCL 2 MG/2ML IJ SOLN: 0.5000 mg | Freq: Once | INTRAMUSCULAR | Status: DC | PRN

## 2013-10-30 MED ORDER — NITROFURANTOIN MONOHYD MACRO 100 MG PO CAPS
100.0000 mg | ORAL_CAPSULE | Freq: Two times a day (BID) | ORAL | Status: DC
  Administered 2013-10-30 – 2013-11-01 (×4): 100 mg via ORAL
  Filled 2013-10-30 (×4): qty 1

## 2013-10-30 MED ORDER — PROMETHAZINE HCL 25 MG/ML IJ SOLN: 6.2500 mg | INTRAMUSCULAR | Status: DC | PRN

## 2013-10-30 MED ORDER — FENTANYL CITRATE 0.05 MG/ML IJ SOLN
25.0000 ug | INTRAMUSCULAR | Status: DC | PRN
Start: 2013-10-30 — End: 2013-10-30
  Administered 2013-10-30 (×2): 50 ug via INTRAVENOUS

## 2013-10-30 MED ORDER — FENTANYL CITRATE 0.05 MG/ML IJ SOLN
INTRAMUSCULAR | Status: AC
  Filled 2013-10-30: qty 2

## 2013-10-30 MED ORDER — LACTATED RINGERS IV SOLN
INTRAVENOUS | Status: DC
  Administered 2013-10-30: 12:00:00 via INTRAVENOUS

## 2013-10-30 MED ORDER — BUPIVACAINE HCL (PF) 0.25 % IJ SOLN
INTRAMUSCULAR | Status: DC | PRN
  Administered 2013-10-30: 6 mL

## 2013-10-30 SURGICAL SUPPLY — 19 items
CHLORAPREP W/TINT 26ML (MISCELLANEOUS) ×2 IMPLANT
CLIP FILSHIE TUBAL LIGA STRL (Clip) ×2 IMPLANT
CLOTH BEACON ORANGE TIMEOUT ST (SAFETY) ×2 IMPLANT
DRSG COVADERM PLUS 2X2 (GAUZE/BANDAGES/DRESSINGS) ×2 IMPLANT
GLOVE BIOGEL PI IND STRL 7.0 (GLOVE) ×1 IMPLANT
GLOVE BIOGEL PI INDICATOR 7.0 (GLOVE) ×1
GLOVE ECLIPSE 7.0 STRL STRAW (GLOVE) ×4 IMPLANT
GOWN STRL REUS W/TWL LRG LVL3 (GOWN DISPOSABLE) ×4 IMPLANT
NEEDLE HYPO 22GX1.5 SAFETY (NEEDLE) ×2 IMPLANT
NS IRRIG 1000ML POUR BTL (IV SOLUTION) ×2 IMPLANT
PACK ABDOMINAL MINOR (CUSTOM PROCEDURE TRAY) ×2 IMPLANT
SPONGE LAP 4X18 X RAY DECT (DISPOSABLE) IMPLANT
SUT VIC AB 0 CT1 27 (SUTURE) ×1
SUT VIC AB 0 CT1 27XBRD ANBCTR (SUTURE) ×1 IMPLANT
SUT VICRYL 4-0 PS2 18IN ABS (SUTURE) ×2 IMPLANT
SYR CONTROL 10ML LL (SYRINGE) ×2 IMPLANT
TOWEL OR 17X24 6PK STRL BLUE (TOWEL DISPOSABLE) ×4 IMPLANT
TRAY FOLEY CATH 14FR (SET/KITS/TRAYS/PACK) ×2 IMPLANT
WATER STERILE IRR 1000ML POUR (IV SOLUTION) ×2 IMPLANT

## 2013-10-30 NOTE — Progress Notes (Signed)
Post Partum Day 1 Subjective: no complaints and NPO for BTL  Objective: Filed Vitals:   10/30/13 0426 10/30/13 0448 10/30/13 0600 10/30/13 0700  BP:  123/71 136/84 135/78  Pulse: 81 87 79   Temp:      TempSrc:      Resp: 18 18 18 18   Height:      Weight:      SpO2: 98% 100% 100%     Blood pressure 135/78, pulse 79, temperature 98.4 F (36.9 C), temperature source Oral, resp. rate 18, height 5\' 2"  (1.575 m), weight 194 lb 8 oz (88.225 kg), last menstrual period 02/19/2013, SpO2 100.00%, unknown if currently breastfeeding.  Physical Exam:  General: alert, cooperative and no distress Lochia: appropriate Uterine Fundus: firm Incision: na DVT Evaluation: No evidence of DVT seen on physical exam.   Recent Labs  10/29/13 1351 10/30/13 0523  HGB 11.1* 9.3*  HCT 32.6* 27.7*    Assessment/Plan: Breastfeeding Awaiting BTL, NPO   LOS: 2 days   Mary Roberts 10/30/2013, 7:22 AM

## 2013-10-30 NOTE — Op Note (Signed)
Mary Roberts  10/28/2013 - 10/30/2013  PREOPERATIVE DIAGNOSIS:  Multiparity, undesired fertility  POSTOPERATIVE DIAGNOSIS:  Multiparity, undesired fertility  PROCEDURE:  Postpartum Bilateral Tubal Sterilization using Filshie Clips   ANESTHESIA:  Epidural and local analgesia using 6.56% Marcaine  COMPLICATIONS:  None immediate.  ESTIMATED BLOOD LOSS: 5 ml.  INDICATIONS: 33 y.o. [id number]  with undesired fertility,status post vaginal delivery, desires permanent sterilization.  Other reversible forms of contraception were discussed with patient; she declines all other modalities. Risks of procedure discussed with patient including but not limited to: risk of regret, permanence of method, bleeding, infection, injury to surrounding organs and need for additional procedures.  Failure risk of 0.5-1% with increased risk of ectopic gestation if pregnancy occurs was also discussed with patient.     FINDINGS:  Normal uterus, tubes, and ovaries.  PROCEDURE DETAILS: The patient was taken to the operating room where her spinal anesthesia was dosed up to surgical level and found to be adequate.  She was then placed in a supine position and prepped and draped in the usual sterile fashion.  After an adequate timeout was performed, attention was turned to the patient's abdomen where a small transverse skin incision was made under the umbilical fold. The incision was taken down to the layer of fascia using the scalpel, and fascia was incised, and extended bilaterally. The peritoneum was entered in a blunt fashion. The patient was placed in Trendelenburg. The left fallopian tube was identified and grasped with a Babcock clamp, and followed out to the fimbriated end.  A Filshie clip was placed on the left fallopian tube about 2 cm from the cornu.  A similar process was carried out on the right side allowing for bilateral tubal sterilization.  Good hemostasis was noted overall.  Local analgesia was injected into both  Filshie application sites.The instruments were then removed from the patient's abdomen and the fascial incision was repaired with 0 Vicryl, and the skin was closed with a 4-0 Vicryl subcuticular stitch. The patient tolerated the procedure well.  Sponge, lap, and needle counts were correct times two.  The patient was then taken to the recovery room awake, extubated and in stable condition.  [name] MD 10/30/2013 12:56 PM

## 2013-10-30 NOTE — Transfer of Care (Signed)
Immediate Anesthesia Transfer of Care Note  Patient: Mary Roberts  Procedure(s) Performed: Procedure(s): POST PARTUM TUBAL LIGATION (N/A)  Patient Location: PACU  Anesthesia Type:Epidural  Level of Consciousness: awake  Airway & Oxygen Therapy: Patient Spontanous Breathing  Post-op Assessment: Report given to PACU RN  Post vital signs: Reviewed and stable  Complications: No apparent anesthesia complications

## 2013-10-30 NOTE — Anesthesia Preprocedure Evaluation (Signed)
Anesthesia Evaluation  Patient identified by MRN, date of birth, ID band Patient awake    Reviewed: Allergy & Precautions, H&P , Patient's Chart, lab work & pertinent test results  Airway Mallampati: III TM Distance: >3 FB Neck ROM: Full    Dental no notable dental hx. (+) Teeth Intact   Pulmonary former smoker,  breath sounds clear to auscultation  Pulmonary exam normal       Cardiovascular hypertension, Pt. on medications and Pt. on home beta blockers Rhythm:Regular Rate:Normal  PIH on MgSO4   Neuro/Psych  Headaches, Seizures -, Well Controlled,     GI/Hepatic Neg liver ROS, GERD-  Medicated and Controlled,  Endo/Other  Obesity  Renal/GU negative Renal ROS  negative genitourinary   Musculoskeletal negative musculoskeletal ROS (+)   Abdominal (+) + obese,   Peds  Hematology  (+) anemia ,   Anesthesia Other Findings   Reproductive/Obstetrics Previous C/Section Successful VBAC PIH superimposed on cHTN                           Anesthesia Physical  Anesthesia Plan  ASA: III  Anesthesia Plan: Epidural   Post-op Pain Management:    Induction:   Airway Management Planned: Natural Airway  Additional Equipment:   Intra-op Plan:   Post-operative Plan:   Informed Consent: I have reviewed the patients History and Physical, chart, labs and discussed the procedure including the risks, benefits and alternatives for the proposed anesthesia with the patient or authorized representative who has indicated his/her understanding and acceptance.     Plan Discussed with: Anesthesiologist  Anesthesia Plan Comments:         Anesthesia Quick Evaluation

## 2013-10-30 NOTE — Interval H&P Note (Signed)
History and Physical Interval Note:  10/30/2013 11:36 AM  Mary Roberts  has presented today now PPD #1, with the diagnosis of Desires Sterilization  The various methods of treatment have been discussed with the patient and family. After consideration of risks, benefits and other options for treatment, the patient has consented to  Procedure(s): POST PARTUM TUBAL LIGATION (N/A) as a surgical intervention .  The patient's history has been reviewed, patient examined. Patient counseled, r.e. Risks benefits of BTL, including permanency of procedure, risk of failure(1:100), increased risk of ectopic.  Patient verbalized understanding and desires to proceed. I have reviewed the patient's chart and labs.  Questions were answered to the patient's satisfaction.     [name]

## 2013-10-30 NOTE — Anesthesia Postprocedure Evaluation (Signed)
  Anesthesia Post Note  Patient: Mary Roberts  Procedure(s) Performed: Procedure(s) (LRB): POST PARTUM TUBAL LIGATION (N/A)  Anesthesia type: Epidural  Patient location: PACU  Post pain: Pain level controlled  Post assessment: Post-op Vital signs reviewed  Last Vitals:  Filed Vitals:   10/30/13 1315  BP: 117/66  Pulse: 76  Temp:   Resp: 18    Post vital signs: Reviewed  Level of consciousness: awake  Complications: No apparent anesthesia complications

## 2013-10-31 MED ORDER — HYDROCHLOROTHIAZIDE 25 MG PO TABS
25.0000 mg | ORAL_TABLET | Freq: Every day | ORAL | Status: DC
  Administered 2013-10-31 – 2013-11-01 (×2): 25 mg via ORAL
  Filled 2013-10-31 (×2): qty 1

## 2013-10-31 MED ORDER — HYDRALAZINE HCL 20 MG/ML IJ SOLN
10.0000 mg | Freq: Once | INTRAMUSCULAR | Status: AC
  Administered 2013-10-31: 10 mg via INTRAVENOUS
  Filled 2013-10-31: qty 1

## 2013-10-31 MED ORDER — LABETALOL HCL 5 MG/ML IV SOLN
10.0000 mg | Freq: Once | INTRAVENOUS | Status: AC
  Administered 2013-10-31: 10 mg via INTRAVENOUS
  Filled 2013-10-31: qty 4

## 2013-10-31 MED ORDER — ENALAPRIL MALEATE 5 MG PO TABS
5.0000 mg | ORAL_TABLET | Freq: Every day | ORAL | Status: DC
  Administered 2013-10-31 – 2013-11-01 (×2): 5 mg via ORAL
  Filled 2013-10-31 (×2): qty 1

## 2013-10-31 NOTE — Anesthesia Postprocedure Evaluation (Signed)
  Anesthesia Post-op Note  Patient: Mary Roberts  Procedure(s) Performed: * No procedures listed *  Patient Location: A-ICU  Anesthesia Type:Epidural  Level of Consciousness: awake and alert   Airway and Oxygen Therapy: Patient Spontanous Breathing  Post-op Pain: mild  Post-op Assessment: Post-op Vital signs reviewed, Patent Airway, No signs of Nausea or vomiting, Pain level controlled, No headache, No residual numbness and No residual motor weakness  Post-op Vital Signs: Reviewed and stable  Last Vitals:  Filed Vitals:   10/31/13 0739  BP:   Pulse:   Temp: 37.1 C  Resp:     Complications: No apparent anesthesia complications

## 2013-10-31 NOTE — Progress Notes (Addendum)
Post Partum Day 2 Subjective: up ad lib, tolerating PO, + flatus and some pain on right side BP remains up--on Labetalol 600 mg tid, additions of HCTZ this am  Objective: Blood pressure 160/82, pulse 88, temperature 98.5 F (36.9 C), temperature source Oral, resp. rate 18, height 5\' 2"  (1.575 m), weight 198 lb 6.4 oz (89.994 kg), last menstrual period 02/19/2013, SpO2 100.00%, unknown if currently breastfeeding. UOP about 100 cc/hr--no good diuresis as yet.  Physical Exam:  General: alert, cooperative and appears stated age Lochia: appropriate Uterine Fundus: firm Incision: no significant drainage DVT Evaluation: No cords or calf tenderness. Positive Homan's sign.   Recent Labs  10/30/13 0523 10/30/13 1355  HGB 9.3* 9.0*  HCT 27.7* 26.5*    Assessment/Plan: Continued problems with blood pressure control. Plan for discharge tomorrow and Breastfeeding Add Vasotec today Continue ICU monitoring.  LOS: 3 days   [name] 10/31/2013, 7:35 AM

## 2013-11-01 ENCOUNTER — Encounter (HOSPITAL_COMMUNITY): Payer: Self-pay | Admitting: Family Medicine

## 2013-11-01 ENCOUNTER — Other Ambulatory Visit: Payer: Medicaid Other

## 2013-11-01 MED ORDER — LABETALOL HCL 300 MG PO TABS: 600.0000 mg | ORAL_TABLET | Freq: Three times a day (TID) | ORAL | Status: DC

## 2013-11-01 MED ORDER — ENALAPRIL MALEATE 5 MG PO TABS: 5.0000 mg | ORAL_TABLET | Freq: Every day | ORAL | Status: DC

## 2013-11-01 MED ORDER — OXYCODONE-ACETAMINOPHEN 5-325 MG PO TABS: 1.0000 | ORAL_TABLET | ORAL | Status: DC | PRN

## 2013-11-01 MED ORDER — IBUPROFEN 600 MG PO TABS: 600.0000 mg | ORAL_TABLET | Freq: Four times a day (QID) | ORAL | Status: DC

## 2013-11-01 MED ORDER — HYDROCHLOROTHIAZIDE 25 MG PO TABS: 25.0000 mg | ORAL_TABLET | Freq: Every day | ORAL | Status: DC

## 2013-11-01 NOTE — Discharge Instructions (Signed)

## 2013-11-01 NOTE — Addendum Note (Signed)
Addendum created 11/01/13 1005 by Georgeanne Nim, CRNA   Modules edited: Charges VN, Notes Section   Notes Section:  File: [id number]

## 2013-11-01 NOTE — Discharge Summary (Signed)
Obstetric Discharge Summary Reason for Admission: induction of labor Prenatal Procedures: ultrasound Intrapartum Procedures: spontaneous vaginal delivery and tubal ligation Postpartum Procedures: P.P. tubal ligation Complications-Operative and Postpartum: hypertension Hemoglobin  Date Value Ref Range Status  10/30/2013 9.0* 12.0 - 15.0 g/dL Final     HCT  Date Value Ref Range Status  10/30/2013 26.5* 36.0 - 46.0 % Final    Physical Exam:  General: alert, cooperative and no distress Lochia: appropriate Uterine Fundus: firm Incision: healing well DVT Evaluation: No evidence of DVT seen on physical exam.  Discharge Diagnoses: Preelampsia and induction delivered, s/p BTL  Discharge Information: Date: 11/01/2013 Activity: pelvic rest Diet: routine Medications: Ibuprofen, Percocet and HCTZ, Vasotec, labetalol Condition: stable and improved Instructions: refer to practice specific booklet Discharge to: home Follow-up Information   Follow up with [facility] In 1 week.   Contact information:   [place] [id number] [phone]      Newborn Data: Live born female  Birth Weight: 6 lb 1.2 oz (2756 g) APGAR: 9, 9  Home with mother.  Mary Roberts 11/01/2013, 7:18 AM

## 2013-11-01 NOTE — Anesthesia Postprocedure Evaluation (Signed)
  Anesthesia Post-op Note  Patient: Mary Roberts  Procedure(s) Performed: * No procedures listed *  Patient Location: Mother/Baby  Anesthesia Type:Epidural  Level of Consciousness: awake, alert , oriented and patient cooperative  Airway and Oxygen Therapy: Patient Spontanous Breathing  Post-op Pain: mild  Post-op Assessment: Patient's Cardiovascular Status Stable, Respiratory Function Stable, No headache, No backache, No residual numbness and No residual motor weakness  Post-op Vital Signs: stable  Last Vitals:  Filed Vitals:   11/01/13 0559  BP: 169/104  Pulse: 77  Temp:   Resp:     Complications: No apparent anesthesia complications

## 2013-11-03 ENCOUNTER — Inpatient Hospital Stay (HOSPITAL_COMMUNITY)
Admission: AD | Admit: 2013-11-03 | Discharge: 2013-11-03 | Disposition: A | Discharge status: Home / Self Care | Payer: Medicaid Other | Source: Ambulatory Visit | Attending: Obstetrics & Gynecology | Admitting: Obstetrics & Gynecology

## 2013-11-03 ENCOUNTER — Encounter (HOSPITAL_COMMUNITY): Payer: Self-pay | Admitting: *Deleted

## 2013-11-03 DIAGNOSIS — I10 Essential (primary) hypertension: Secondary | ICD-10-CM

## 2013-11-03 DIAGNOSIS — G40909 Epilepsy, unspecified, not intractable, without status epilepticus: Secondary | ICD-10-CM

## 2013-11-03 DIAGNOSIS — O1410 Severe pre-eclampsia, unspecified trimester: Secondary | ICD-10-CM

## 2013-11-03 DIAGNOSIS — IMO0002 Reserved for concepts with insufficient information to code with codable children: Secondary | ICD-10-CM

## 2013-11-03 DIAGNOSIS — Z87891 Personal history of nicotine dependence: Secondary | ICD-10-CM | POA: Insufficient documentation

## 2013-11-03 DIAGNOSIS — O1494 Unspecified pre-eclampsia, complicating childbirth: Secondary | ICD-10-CM

## 2013-11-03 DIAGNOSIS — O99893 Other specified diseases and conditions complicating puerperium: Secondary | ICD-10-CM | POA: Insufficient documentation

## 2013-11-03 DIAGNOSIS — R030 Elevated blood-pressure reading, without diagnosis of hypertension: Secondary | ICD-10-CM | POA: Insufficient documentation

## 2013-11-03 DIAGNOSIS — O9989 Other specified diseases and conditions complicating pregnancy, childbirth and the puerperium: Principal | ICD-10-CM

## 2013-11-03 DIAGNOSIS — O0992 Supervision of high risk pregnancy, unspecified, second trimester: Secondary | ICD-10-CM

## 2013-11-03 DIAGNOSIS — O34219 Maternal care for unspecified type scar from previous cesarean delivery: Secondary | ICD-10-CM

## 2013-11-03 LAB — COMPREHENSIVE METABOLIC PANEL
ALK PHOS: 77 U/L (ref 39–117)
ALT: 36 U/L — AB (ref 0–35)
AST: 43 U/L — AB (ref 0–37)
Albumin: 3.2 g/dL — ABNORMAL LOW (ref 3.5–5.2)
BILIRUBIN TOTAL: 0.4 mg/dL (ref 0.3–1.2)
BUN: 13 mg/dL (ref 6–23)
CO2: 29 meq/L (ref 19–32)
Calcium: 9.3 mg/dL (ref 8.4–10.5)
Chloride: 97 mEq/L (ref 96–112)
Creatinine, Ser: 0.77 mg/dL (ref 0.50–1.10)
GFR calc non Af Amer: >90 mL/min (ref >90)
GLUCOSE: 106 mg/dL — AB (ref 70–99)
POTASSIUM: 3.1 meq/L — AB (ref 3.7–5.3)
SODIUM: 140 meq/L (ref 137–147)
Total Protein: 7 g/dL (ref 6.0–8.3)

## 2013-11-03 LAB — CBC
HCT: 29.4 % — ABNORMAL LOW (ref 36.0–46.0)
Hemoglobin: 9.7 g/dL — ABNORMAL LOW (ref 12.0–15.0)
MCH: 30.4 pg (ref 26.0–34.0)
MCHC: 33 g/dL (ref 30.0–36.0)
MCV: 92.2 fL (ref 78.0–100.0)
Platelets: 227 10*3/uL (ref 150–400)
RBC: 3.19 MIL/uL — ABNORMAL LOW (ref 3.87–5.11)
RDW: 13.9 % (ref 11.5–15.5)
WBC: 8.3 10*3/uL (ref 4.0–10.5)

## 2013-11-03 MED ORDER — HYDROCHLOROTHIAZIDE 25 MG PO TABS
25.0000 mg | ORAL_TABLET | Freq: Once | ORAL | Status: AC
  Administered 2013-11-03: 25 mg via ORAL
  Filled 2013-11-03: qty 1

## 2013-11-03 MED ORDER — ENALAPRIL MALEATE 5 MG PO TABS: 5.0000 mg | ORAL_TABLET | Freq: Every day | ORAL | Status: DC

## 2013-11-03 MED ORDER — ENALAPRIL MALEATE 5 MG PO TABS
5.0000 mg | ORAL_TABLET | Freq: Once | ORAL | Status: AC
  Administered 2013-11-03: 5 mg via ORAL
  Filled 2013-11-03: qty 1

## 2013-11-03 NOTE — Discharge Instructions (Signed)
Medication Dose     Iron 325mg  1 tab 6 am    Labetalol 300mg  2 tab 6am 2pm 10pm  HCTZ 25mg  1 tab 6am    Prilosec  stop     Prenatal Vitamin    10 pm  Enalapril 5mg  1 tab 6am

## 2013-11-03 NOTE — MAU Note (Signed)
Patient states she had a vaginal delivery on 5-8 and had a BTL after. Was seen by the home health RN today and her blood pressure was elevated. States at the time she was in a lot of pain and she had not taken medication or eaten and thinks that might have been the reason. Having social issues that also might have made it high. Has also been taking Labetalol but had not taken it. Denies blurred vision or headaches.

## 2013-11-03 NOTE — MAU Note (Signed)
Pt is breast feeding, milk is in, going well

## 2013-11-03 NOTE — MAU Provider Note (Signed)
History     CSN: [id number]  Arrival date and time: 11/03/13 1739   First Provider Initiated Contact with Patient 11/03/13 1855      Chief Complaint  Patient presents with  . Hypertension   Hypertension    33 yo [id number] who is 5 days s/p vaginal delivery and 4 days s/p pp BTL who presents today for elevated bp. She has a hx of poorly controlled cHTN with presumed superimposed pre-eclampsia.  Was seen at home by home nurse today and pt states she was in the middle of an argument with her boyfriend and had just been moving around and was stressed and her bp was in the 160s/90s. She was told to come here.  She denies any symptoms. No HA, vision changes, abdominal pain, swelling. Has had normal UOP.    Believes that all of her elevated bp is due to stress. She was discharged on HCTZ, enalapril and labetalol but only got labetalol and HCTZ at the pharmacy. She is taking them regularly although wasn't sure how to time her labetalol.  Has an appt on Monday for bp recheck.   Of note, bp at time of discharge from the hospital was 170/100  OB History   Grav Para Term Preterm Abortions TAB SAB Ect Mult Living   4 3 2 1 1 1    3       Past Medical History  Diagnosis Date  . Hypertension   . Headache(784.0)     migraines  . Chlamydia   . Pregnancy induced hypertension     with first  . Seizures     No meds, off since in mid to late 20's  . Anemia   . Infection     UTI  . Anxiety     panic attacks  . History of C-section     Past Surgical History  Procedure Laterality Date  . Cesarean section    . Mouth surgery    . Tubal ligation N/A 10/30/2013    Procedure: POST PARTUM TUBAL LIGATION;  Surgeon: [name], MD;  Location: [facility];  Service: Gynecology;  Laterality: N/A;    Family History  Problem Relation Age of Onset  . Seizures Mother   . Hypertension Mother   . Diabetes Mother   . Heart disease Mother   . Stroke Mother   . Seizures Brother   . Hearing loss Neg Hx      History  Substance Use Topics  . Smoking status: Former Research scientist (life sciences)  . Smokeless tobacco: Never Used  . Alcohol Use: No     Comment: socially     Allergies:  Allergies  Allergen Reactions  . Amoxicillin Hives    Prescriptions prior to admission  Medication Sig Dispense Refill  . enalapril (VASOTEC) 5 MG tablet Take 1 tablet (5 mg total) by mouth daily.  30 tablet  3  . Fe Fum-FePoly-FA-Vit C-Vit B3 (INTEGRA F) 125-1 MG CAPS Take 1 tablet by mouth daily.  30 capsule  1  . ibuprofen (ADVIL,MOTRIN) 600 MG tablet Take 1 tablet (600 mg total) by mouth every 6 (six) hours.  30 tablet  0  . labetalol (NORMODYNE) 300 MG tablet Take 2 tablets (600 mg total) by mouth 3 (three) times daily.  180 tablet  3  . omeprazole (PRILOSEC) 20 MG capsule Take 1 capsule (20 mg total) by mouth daily.  30 capsule  1  . oxyCODONE-acetaminophen (PERCOCET/ROXICET) 5-325 MG per tablet Take 1-2 tablets by mouth every 4 (  four) hours as needed for severe pain (moderate - severe pain).  30 tablet  0  . Prenatal Vit-Fe Fumarate-FA (PRENATAL MULTIVITAMIN) TABS tablet Take 1 tablet by mouth daily at 12 noon.        ROS- see above.  Physical Exam   Blood pressure 148/80, pulse 80, temperature 98.6 F (37 C), temperature source Oral, resp. rate 16, unknown if currently breastfeeding.  Physical Exam  Constitutional: She is oriented to person, place, and time. She appears well-developed and well-nourished.  HENT:  Head: Normocephalic.  Eyes: Pupils are equal, round, and reactive to light.  Neck: Neck supple.  Cardiovascular: Normal rate, regular rhythm and normal heart sounds.   Respiratory: Effort normal and breath sounds normal.  GI: Soft. Bowel sounds are normal.  Musculoskeletal: Normal range of motion. She exhibits edema (1+ to mid calf).  Neurological: She is alert and oriented to person, place, and time.  Skin: Skin is warm and dry.  Psychiatric: She has a normal mood and affect.  DTRs 2+  MAU Course   Procedures  MDM Cbc/cmp Cath ua  Assessment and Plan  33 yo [phone] with a hx of cHTN and superimposed pre-e who is 5 days s/p SVD.    bp appears at baseline for pt in review of chart.  No symptoms of pre-e. Labs unremarkable- slight LFT bump but <2x normal. Pt refuses cath UA even after discussion as to reasons.  Enalapril 5mg  given oto here and rx provided hctz and labetalol to cont at home. No concern currently to keep her inpatient and this was discussed with her.    Pre-e symptoms to return discussed and pt agrees. Has f/u on Monday in clinic and will need repeat CMP at that time.   Case discussed with Dr. Glo Herring.   Emily Forse L Gracelin Weisberg 11/03/2013, 7:18 PM

## 2013-11-03 NOTE — MAU Note (Signed)
Has CHTN, checked by home nurse today.  Under some stress, had not taken medication.  Denies visual changes, HA or epigastric pain.  Still has swelling.

## 2013-11-03 NOTE — MAU Note (Signed)
HCTZ NOT given. Had clicked given when the pt took it, then she asked again what it was, and said I think I am taking this.  Pt IS taking this at home and had taken it this morning.  Dr Olevia Bowens present, said not to take it.

## 2013-11-04 ENCOUNTER — Other Ambulatory Visit: Payer: Self-pay | Admitting: Obstetrics & Gynecology

## 2013-11-04 ENCOUNTER — Telehealth: Payer: Self-pay | Admitting: General Practice

## 2013-11-04 DIAGNOSIS — O139 Gestational [pregnancy-induced] hypertension without significant proteinuria, unspecified trimester: Secondary | ICD-10-CM

## 2013-11-04 MED ORDER — HYDRALAZINE HCL 10 MG PO TABS: 10.0000 mg | ORAL_TABLET | Freq: Two times a day (BID) | ORAL | Status: DC

## 2013-11-04 NOTE — Telephone Encounter (Addendum)
Patient called and left message stating she was seen in MAU yesterday because the home nurse sent her there for high BP. She was given a third blood pressure medication while she was here but she just found out it isn't covered by insurance and would like something different called in. Spoke to Dr Ihor Dow, who wrote for hydralazine. Called patient and informed her of new medication available for pickup. Patient verbalized understanding and had no further questions

## 2013-11-07 NOTE — MAU Provider Note (Signed)
`````  Attestation of Attending Supervision of Advanced Practitioner: Evaluation and management procedures were performed by the PA/NP/CNM/OB Fellow under my supervision/collaboration. Chart reviewed and agree with management and plan.  Jonnie Kind 11/07/2013 7:03 PM

## 2013-11-12 ENCOUNTER — Ambulatory Visit (HOSPITAL_COMMUNITY): Payer: Medicaid Other

## 2013-11-17 ENCOUNTER — Telehealth: Payer: Self-pay | Admitting: *Deleted

## 2013-11-17 NOTE — Telephone Encounter (Signed)
Would like to speak to someone about her BTL incision.

## 2013-11-17 NOTE — Telephone Encounter (Signed)
Called patient and she stated that the area where they did her tubal is a little sore and she has a clear drainage. She denies any fever or foul odor. I advised that she keep the incision clean and dry and if it doesn't improve or her pain becomes severe she should call us back. Patient is agreeable to this.

## 2013-11-21 ENCOUNTER — Inpatient Hospital Stay (HOSPITAL_COMMUNITY)
Admission: AD | Admit: 2013-11-21 | Discharge: 2013-11-21 | Disposition: A | Discharge status: Home / Self Care | Payer: Medicaid Other | Source: Ambulatory Visit | Attending: Obstetrics and Gynecology | Admitting: Obstetrics and Gynecology

## 2013-11-21 ENCOUNTER — Encounter (HOSPITAL_COMMUNITY): Payer: Self-pay | Admitting: *Deleted

## 2013-11-21 DIAGNOSIS — D649 Anemia, unspecified: Secondary | ICD-10-CM | POA: Insufficient documentation

## 2013-11-21 DIAGNOSIS — L918 Other hypertrophic disorders of the skin: Secondary | ICD-10-CM | POA: Insufficient documentation

## 2013-11-21 DIAGNOSIS — Z87891 Personal history of nicotine dependence: Secondary | ICD-10-CM | POA: Insufficient documentation

## 2013-11-21 DIAGNOSIS — I10 Essential (primary) hypertension: Secondary | ICD-10-CM | POA: Diagnosis not present

## 2013-11-21 DIAGNOSIS — G43909 Migraine, unspecified, not intractable, without status migrainosus: Secondary | ICD-10-CM | POA: Diagnosis not present

## 2013-11-21 DIAGNOSIS — F411 Generalized anxiety disorder: Secondary | ICD-10-CM | POA: Diagnosis not present

## 2013-11-21 DIAGNOSIS — L929 Granulomatous disorder of the skin and subcutaneous tissue, unspecified: Secondary | ICD-10-CM

## 2013-11-21 NOTE — MAU Provider Note (Signed)
  History     CSN: [id number]  Arrival date and time: 11/21/13 1121   First Provider Initiated Contact with Patient 11/21/13 1221      Chief Complaint  Patient presents with  . Wound Check   Wound Check   through 33-year-old female gravida 4 para 2113 now almost 3 weeks status post postpartum tubal ligation performed after recent delivery through the umbilical incision. Patient has noted a small area of redness at the incision site. Inspection reveals a 3-4 mm area of granulation tissue at the incision site. Patient agrees to treatment with silver nitrate, with satisfactory tissue changes    Past Medical History  Diagnosis Date  . Hypertension   . Headache(784.0)     migraines  . Chlamydia   . Pregnancy induced hypertension     with first  . Seizures     No meds, off since in mid to late 20's  . Anemia   . Infection     UTI  . Anxiety     panic attacks  . History of C-section     Past Surgical History  Procedure Laterality Date  . Cesarean section    . Mouth surgery    . Tubal ligation N/A 10/30/2013    Procedure: POST PARTUM TUBAL LIGATION;  Surgeon: [name], MD;  Location: [facility];  Service: Gynecology;  Laterality: N/A;    Family History  Problem Relation Age of Onset  . Seizures Mother   . Hypertension Mother   . Diabetes Mother   . Heart disease Mother   . Stroke Mother   . Seizures Brother   . Hearing loss Neg Hx     History  Substance Use Topics  . Smoking status: Former Research scientist (life sciences)  . Smokeless tobacco: Never Used  . Alcohol Use: No     Comment: socially     Allergies:  Allergies  Allergen Reactions  . Amoxicillin Hives    Prescriptions prior to admission  Medication Sig Dispense Refill  . enalapril (VASOTEC) 5 MG tablet Take 5 mg by mouth daily.      . hydrochlorothiazide (HYDRODIURIL) 25 MG tablet Take 25 mg by mouth daily.      Marland Kitchen labetalol (NORMODYNE) 300 MG tablet Take 2 tablets (600 mg total) by mouth 3 (three) times daily.  180  tablet  3  . Prenatal Vit-Fe Fumarate-FA (PRENATAL MULTIVITAMIN) TABS tablet Take 1 tablet by mouth daily at 12 noon.        ROS Physical Exam   Blood pressure 132/87, pulse 79, temperature 98.1 F (36.7 C), temperature source Oral, resp. rate 18, height 5' 1.5" (1.562 m), weight 80.797 kg (178 lb 2 oz), currently breastfeeding.  Physical Exam  Constitutional: She appears well-developed and well-nourished. No distress.  HENT:  Head: Normocephalic.  Eyes: EOM are normal.  GI: Soft. Bowel sounds are normal. She exhibits no distension. There is no tenderness.  3-4 mm area of granulation tissue in the otherwise well-healed umbilical incision site, treated with silver nitrate with good tissue changes    MAU Course  Procedures  MDM Exam, treatments with silver nitrate  Assessment and Plan  Postoperative incision granulation tissue, treated, resolved Plan keep scheduled postpartum visit Topical Neosporin as needed for comfort and healing Topical Band-Aid when necessary  Jonnie Kind 11/21/2013, 12:33 PM

## 2013-11-21 NOTE — Discharge Instructions (Signed)
Keep postpartum appointment as previously scheduled Topical Neosporin as needed, Band-Aid as needed

## 2013-11-21 NOTE — MAU Note (Signed)
Patient presents with complaint that she would like to have her incision assessed. Had a sterilization on 5/8. States it is red, moist, has an odor and doesn't look approximated.

## 2013-12-09 ENCOUNTER — Encounter: Payer: Self-pay | Admitting: Obstetrics & Gynecology

## 2013-12-09 ENCOUNTER — Ambulatory Visit (INDEPENDENT_AMBULATORY_CARE_PROVIDER_SITE_OTHER): Payer: Medicaid Other | Admitting: Obstetrics & Gynecology

## 2013-12-09 VITALS — BP 160/90 | HR 86 | Ht 62.5 in

## 2013-12-09 DIAGNOSIS — IMO0002 Reserved for concepts with insufficient information to code with codable children: Secondary | ICD-10-CM

## 2013-12-09 DIAGNOSIS — O1494 Unspecified pre-eclampsia, complicating childbirth: Secondary | ICD-10-CM

## 2013-12-09 MED ORDER — AMLODIPINE BESY-BENAZEPRIL HCL 10-40 MG PO CAPS: 1.0000 | ORAL_CAPSULE | Freq: Every day | ORAL | Status: DC

## 2013-12-09 NOTE — Patient Instructions (Addendum)
AmLODipine-benazepril (LOTREL) -- START today taking nightly  Call Dr. Prince Rome to get appointment to follow up high blood pressure  Today was: BP 160/90  Pulse 86  Ht 5' 2.5" (1.588 m)  Breastfeeding? Yes   Goal is under 140/90  Breastfeeding Deciding to breastfeed is one of the best choices you can make for you and your baby. A change in hormones during pregnancy causes your breast tissue to grow and increases the number and size of your milk ducts. These hormones also allow proteins, sugars, and fats from your blood supply to make breast milk in your milk-producing glands. Hormones prevent breast milk from being released before your baby is born as well as prompt milk flow after birth. Once breastfeeding has begun, thoughts of your baby, as well as his or her sucking or crying, can stimulate the release of milk from your milk-producing glands.  BENEFITS OF BREASTFEEDING For Your Baby  Your first milk (colostrum) helps your baby's digestive system function better.   There are antibodies in your milk that help your baby fight off infections.   Your baby has a lower incidence of asthma, allergies, and sudden infant death syndrome.   The nutrients in breast milk are better for your baby than infant formulas and are designed uniquely for your baby's needs.   Breast milk improves your baby's brain development.   Your baby is less likely to develop other conditions, such as childhood obesity, asthma, or type 2 diabetes mellitus.  For You   Breastfeeding helps to create a very special bond between you and your baby.   Breastfeeding is convenient. Breast milk is always available at the correct temperature and costs nothing.   Breastfeeding helps to burn calories and helps you lose the weight gained during pregnancy.   Breastfeeding makes your uterus contract to its prepregnancy size faster and slows bleeding (lochia) after you give birth.   Breastfeeding helps to lower your  risk of developing type 2 diabetes mellitus, osteoporosis, and breast or ovarian cancer later in life. SIGNS THAT YOUR BABY IS HUNGRY Early Signs of Hunger  Increased alertness or activity.  Stretching.  Movement of the head from side to side.  Movement of the head and opening of the mouth when the corner of the mouth or cheek is stroked (rooting).  Increased sucking sounds, smacking lips, cooing, sighing, or squeaking.  Hand-to-mouth movements.  Increased sucking of fingers or hands. Late Signs of Hunger  Fussing.  Intermittent crying. Extreme Signs of Hunger Signs of extreme hunger will require calming and consoling before your baby will be able to breastfeed successfully. Do not wait for the following signs of extreme hunger to occur before you initiate breastfeeding:   Restlessness.  A loud, strong cry.   Screaming. BREASTFEEDING BASICS Breastfeeding Initiation  Find a comfortable place to sit or lie down, with your neck and back well supported.  Place a pillow or rolled up blanket under your baby to bring him or her to the level of your breast (if you are seated). Nursing pillows are specially designed to help support your arms and your baby while you breastfeed.  Make sure that your baby's abdomen is facing your abdomen.   Gently massage your breast. With your fingertips, massage from your chest wall toward your nipple in a circular motion. This encourages milk flow. You may need to continue this action during the feeding if your milk flows slowly.  Support your breast with 4 fingers underneath and your thumb above  your nipple. Make sure your fingers are well away from your nipple and your baby's mouth.   Stroke your baby's lips gently with your finger or nipple.   When your baby's mouth is open wide enough, quickly bring your baby to your breast, placing your entire nipple and as much of the colored area around your nipple (areola) as possible into your baby's  mouth.   More areola should be visible above your baby's upper lip than below the lower lip.   Your baby's tongue should be between his or her lower gum and your breast.   Ensure that your baby's mouth is correctly positioned around your nipple (latched). Your baby's lips should create a seal on your breast and be turned out (everted).  It is common for your baby to suck about 2-3 minutes in order to start the flow of breast milk. Latching Teaching your baby how to latch on to your breast properly is very important. An improper latch can cause nipple pain and decreased milk supply for you and poor weight gain in your baby. Also, if your baby is not latched onto your nipple properly, he or she may swallow some air during feeding. This can make your baby fussy. Burping your baby when you switch breasts during the feeding can help to get rid of the air. However, teaching your baby to latch on properly is still the best way to prevent fussiness from swallowing air while breastfeeding. Signs that your baby has successfully latched on to your nipple:    Silent tugging or silent sucking, without causing you pain.   Swallowing heard between every 3-4 sucks.    Muscle movement above and in front of his or her ears while sucking.  Signs that your baby has not successfully latched on to nipple:   Sucking sounds or smacking sounds from your baby while breastfeeding.  Nipple pain. If you think your baby has not latched on correctly, slip your finger into the corner of your baby's mouth to break the suction and place it between your baby's gums. Attempt breastfeeding initiation again. Signs of Successful Breastfeeding Signs from your baby:   A gradual decrease in the number of sucks or complete cessation of sucking.   Falling asleep.   Relaxation of his or her body.   Retention of a small amount of milk in his or her mouth.   Letting go of your breast by himself or herself. Signs  from you:  Breasts that have increased in firmness, weight, and size 1-3 hours after feeding.   Breasts that are softer immediately after breastfeeding.  Increased milk volume, as well as a change in milk consistency and color by the fifth day of breastfeeding.   Nipples that are not sore, cracked, or bleeding. Signs That Your Randel Books is Getting Enough Milk  Wetting at least 3 diapers in a 24-hour period. The urine should be clear and pale yellow by age 566 days.  At least 3 stools in a 24-hour period by age 566 days. The stool should be soft and yellow.  At least 3 stools in a 24-hour period by age 56 days. The stool should be seedy and yellow.  No loss of weight greater than 10% of birth weight during the first 74 days of age.  Average weight gain of 4-7 ounces (113-198 g) per week after age 54 days.  Consistent daily weight gain by age 29 days, without weight loss after the age of 2 weeks. After a feeding,  your baby may spit up a small amount. This is common. BREASTFEEDING FREQUENCY AND DURATION Frequent feeding will help you make more milk and can prevent sore nipples and breast engorgement. Breastfeed when you feel the need to reduce the fullness of your breasts or when your baby shows signs of hunger. This is called "breastfeeding on demand." Avoid introducing a pacifier to your baby while you are working to establish breastfeeding (the first 4-6 weeks after your baby is born). After this time you may choose to use a pacifier. Research has shown that pacifier use during the first year of a baby's life decreases the risk of sudden infant death syndrome (SIDS). Allow your baby to feed on each breast as long as he or she wants. Breastfeed until your baby is finished feeding. When your baby unlatches or falls asleep while feeding from the first breast, offer the second breast. Because newborns are often sleepy in the first few weeks of life, you may need to awaken your baby to get him or her to  feed. Breastfeeding times will vary from baby to baby. However, the following rules can serve as a guide to help you ensure that your baby is properly fed:  Newborns (babies 35 weeks of age or younger) may breastfeed every 1-3 hours.  Newborns should not go longer than 3 hours during the day or 5 hours during the night without breastfeeding.  You should breastfeed your baby a minimum of 8 times in a 24-hour period until you begin to introduce solid foods to your baby at around 46 months of age. BREAST MILK PUMPING Pumping and storing breast milk allows you to ensure that your baby is exclusively fed your breast milk, even at times when you are unable to breastfeed. This is especially important if you are going back to work while you are still breastfeeding or when you are not able to be present during feedings. Your lactation consultant can give you guidelines on how long it is safe to store breast milk.  A breast pump is a machine that allows you to pump milk from your breast into a sterile bottle. The pumped breast milk can then be stored in a refrigerator or freezer. Some breast pumps are operated by hand, while others use electricity. Ask your lactation consultant which type will work best for you. Breast pumps can be purchased, but some hospitals and breastfeeding support groups lease breast pumps on a monthly basis. A lactation consultant can teach you how to hand express breast milk, if you prefer not to use a pump.  CARING FOR YOUR BREASTS WHILE YOU BREASTFEED Nipples can become dry, cracked, and sore while breastfeeding. The following recommendations can help keep your breasts moisturized and healthy:  Avoid using soap on your nipples.   Wear a supportive bra. Although not required, special nursing bras and tank tops are designed to allow access to your breasts for breastfeeding without taking off your entire bra or top. Avoid wearing underwire-style bras or extremely tight bras.  Air dry  your nipples for 3-109minutes after each feeding.   Use only cotton bra pads to absorb leaked breast milk. Leaking of breast milk between feedings is normal.   Use lanolin on your nipples after breastfeeding. Lanolin helps to maintain your skin's normal moisture barrier. If you use pure lanolin, you do not need to wash it off before feeding your baby again. Pure lanolin is not toxic to your baby. You may also hand express a few drops of  breast milk and gently massage that milk into your nipples and allow the milk to air dry. In the first few weeks after giving birth, some women experience extremely full breasts (engorgement). Engorgement can make your breasts feel heavy, warm, and tender to the touch. Engorgement peaks within 3-5 days after you give birth. The following recommendations can help ease engorgement:  Completely empty your breasts while breastfeeding or pumping. You may want to start by applying warm, moist heat (in the shower or with warm water-soaked hand towels) just before feeding or pumping. This increases circulation and helps the milk flow. If your baby does not completely empty your breasts while breastfeeding, pump any extra milk after he or she is finished.  Wear a snug bra (nursing or regular) or tank top for 1-2 days to signal your body to slightly decrease milk production.  Apply ice packs to your breasts, unless this is too uncomfortable for you.  Make sure that your baby is latched on and positioned properly while breastfeeding. If engorgement persists after 48 hours of following these recommendations, contact your health care provider or a Science writer. OVERALL HEALTH CARE RECOMMENDATIONS WHILE BREASTFEEDING  Eat healthy foods. Alternate between meals and snacks, eating 3 of each per day. Because what you eat affects your breast milk, some of the foods may make your baby more irritable than usual. Avoid eating these foods if you are sure that they are negatively  affecting your baby.  Drink milk, fruit juice, and water to satisfy your thirst (about 10 glasses a day).   Rest often, relax, and continue to take your prenatal vitamins to prevent fatigue, stress, and anemia.  Continue breast self-awareness checks.  Avoid chewing and smoking tobacco.  Avoid alcohol and drug use. Some medicines that may be harmful to your baby can pass through breast milk. It is important to ask your health care provider before taking any medicine, including all over-the-counter and prescription medicine as well as vitamin and herbal supplements. It is possible to become pregnant while breastfeeding. If birth control is desired, ask your health care provider about options that will be safe for your baby. SEEK MEDICAL CARE IF:   You feel like you want to stop breastfeeding or have become frustrated with breastfeeding.  You have painful breasts or nipples.  Your nipples are cracked or bleeding.  Your breasts are red, tender, or warm.  You have a swollen area on either breast.  You have a fever or chills.  You have nausea or vomiting.  You have drainage other than breast milk from your nipples.  Your breasts do not become full before feedings by the fifth day after you give birth.  You feel sad and depressed.  Your baby is too sleepy to eat well.  Your baby is having trouble sleeping.   Your baby is wetting less than 3 diapers in a 24-hour period.  Your baby has less than 3 stools in a 24-hour period.  Your baby's skin or the white part of his or her eyes becomes yellow.   Your baby is not gaining weight by 49 days of age. SEEK IMMEDIATE MEDICAL CARE IF:   Your baby is overly tired (lethargic) and does not want to wake up and feed.  Your baby develops an unexplained fever. Document Released: 06/10/2005 Document Revised: 06/15/2013 Document Reviewed: 12/02/2012 [facility] Patient Information 2015 [place], [place]. This information is not intended to  replace advice given to you by your health care provider. Make sure you  discuss any questions you have with your health care provider.  

## 2013-12-09 NOTE — Progress Notes (Signed)
Subjective:     Mary Roberts is a 33 y.o. female who presents for a postpartum visit. She is 6 weeks postpartum following a spontaneous vaginal delivery. I have fully reviewed the prenatal and intrapartum course. The delivery was at 79 gestational weeks, induced for hypertension during pregnancy. Outcome: spontaneous vaginal delivery. Anesthesia: epidural. Postpartum course has been uncomplicated. Baby's course has been good. Baby is feeding by both breast and bottle - Mary Roberts Start Gentle. Bleeding staining only. Bowel function is normal. Bladder function is normal. Patient is not sexually active. Contraception method is tubal ligation following delivery. Postpartum depression screening: negative.  Currently taking labetalol with good compliance, but stopped taking lisinopril or HCTZ regularly due to side effects of nausea, vomiting, headache Dr. Colletta Maryland Powers -- [facility]  The following portions of the patient's history were reviewed and updated as appropriate: allergies, current medications, past family history, past medical history, past social history, past surgical history and problem list.  Review of Systems Pertinent items are noted in HPI.   Objective:    BP 160/90  Pulse 86  Ht 5' 2.5" (1.588 m)  Breastfeeding? Yes  General:  alert and cooperative   Breasts:  inspection negative, no nipple discharge or bleeding, no masses or nodularity palpable  Lungs: clear to auscultation bilaterally  Heart:  regular rate and rhythm, S1, S2 normal, no murmur, click, rub or gallop  Abdomen: soft, non-tender; bowel sounds normal; no masses,  no organomegaly        Assessment:    6 weeks postpartum exam. Pap smear not done at today's visit.   Plan:    1. Contraception: tubal ligation 2. Hypertension --START Lotrel 10-40 mg daily] --F/u in 2 weeks with PCP 3. Follow up in: 2 weeks with PCP.   Mary L. Cherlynn June, MD, [place]

## 2013-12-09 NOTE — Progress Notes (Signed)
Patient reports that one of her BP meds is causing her to be nauseated.

## 2013-12-10 NOTE — Progress Notes (Signed)
Patient ID: Mary Roberts, female   DOB: 07-21-1980, 33 y.o.   MRN: [id number] Attestation of Attending Supervision of Resident: Evaluation and management procedures were performed by the [facility] Medicine Resident under my supervision.  I have seen and examined the patient, reviewed the resident's note and chart, and I agree with the management and plan.  Lasandra Beech, M.D. 12/10/2013 1:20 PM

## 2013-12-13 ENCOUNTER — Encounter: Payer: Self-pay | Admitting: *Deleted

## 2014-04-02 ENCOUNTER — Encounter (HOSPITAL_COMMUNITY): Payer: Self-pay | Admitting: Emergency Medicine

## 2014-04-02 ENCOUNTER — Emergency Department (HOSPITAL_COMMUNITY)
Admission: EM | Admit: 2014-04-02 | Discharge: 2014-04-02 | Disposition: A | Discharge status: Home / Self Care | Payer: Medicaid Other | Attending: Emergency Medicine | Admitting: Emergency Medicine

## 2014-04-02 DIAGNOSIS — IMO0001 Reserved for inherently not codable concepts without codable children: Secondary | ICD-10-CM

## 2014-04-02 DIAGNOSIS — Z8659 Personal history of other mental and behavioral disorders: Secondary | ICD-10-CM | POA: Insufficient documentation

## 2014-04-02 DIAGNOSIS — R030 Elevated blood-pressure reading, without diagnosis of hypertension: Secondary | ICD-10-CM

## 2014-04-02 DIAGNOSIS — G5601 Carpal tunnel syndrome, right upper limb: Secondary | ICD-10-CM | POA: Insufficient documentation

## 2014-04-02 DIAGNOSIS — K047 Periapical abscess without sinus: Secondary | ICD-10-CM

## 2014-04-02 DIAGNOSIS — I10 Essential (primary) hypertension: Secondary | ICD-10-CM | POA: Insufficient documentation

## 2014-04-02 DIAGNOSIS — G5602 Carpal tunnel syndrome, left upper limb: Secondary | ICD-10-CM | POA: Insufficient documentation

## 2014-04-02 DIAGNOSIS — G5603 Carpal tunnel syndrome, bilateral upper limbs: Secondary | ICD-10-CM | POA: Insufficient documentation

## 2014-04-02 DIAGNOSIS — Z87440 Personal history of urinary (tract) infections: Secondary | ICD-10-CM | POA: Insufficient documentation

## 2014-04-02 DIAGNOSIS — Z79899 Other long term (current) drug therapy: Secondary | ICD-10-CM | POA: Insufficient documentation

## 2014-04-02 DIAGNOSIS — Z880 Allergy status to penicillin: Secondary | ICD-10-CM | POA: Insufficient documentation

## 2014-04-02 DIAGNOSIS — Z8619 Personal history of other infectious and parasitic diseases: Secondary | ICD-10-CM | POA: Insufficient documentation

## 2014-04-02 DIAGNOSIS — Z862 Personal history of diseases of the blood and blood-forming organs and certain disorders involving the immune mechanism: Secondary | ICD-10-CM | POA: Insufficient documentation

## 2014-04-02 HISTORY — DX: Carpal tunnel syndrome, bilateral upper limbs: G56.03

## 2014-04-02 MED ORDER — NAPROXEN 500 MG PO TABS: 500.0000 mg | ORAL_TABLET | Freq: Two times a day (BID) | ORAL | Status: DC

## 2014-04-02 MED ORDER — CLINDAMYCIN HCL 300 MG PO CAPS: 300.0000 mg | ORAL_CAPSULE | Freq: Four times a day (QID) | ORAL | Status: DC

## 2014-04-02 NOTE — Discharge Instructions (Signed)
Take clindamycin as prescribed for 7 days. Follow up with the dentist. It is very important for you to take your blood pressure medications as prescribed.  Dental Abscess A dental abscess is a collection of infected fluid (pus) from a bacterial infection in the inner part of the tooth (pulp). It usually occurs at the end of the tooth's root.  CAUSES   Severe tooth decay.  Trauma to the tooth that allows bacteria to enter into the pulp, such as a broken or chipped tooth. SYMPTOMS   Severe pain in and around the infected tooth.  Swelling and redness around the abscessed tooth or in the mouth or face.  Tenderness.  Pus drainage.  Bad breath.  Bitter taste in the mouth.  Difficulty swallowing.  Difficulty opening the mouth.  Nausea.  Vomiting.  Chills.  Swollen neck glands. DIAGNOSIS   A medical and dental history will be taken.  An examination will be performed by tapping on the abscessed tooth.  X-rays may be taken of the tooth to identify the abscess. TREATMENT The goal of treatment is to eliminate the infection. You may be prescribed antibiotic medicine to stop the infection from spreading. A root canal may be performed to save the tooth. If the tooth cannot be saved, it may be pulled (extracted) and the abscess may be drained.  HOME CARE INSTRUCTIONS  Only take over-the-counter or prescription medicines for pain, fever, or discomfort as directed by your caregiver.  Rinse your mouth (gargle) often with salt water ( tsp salt in 8 oz [250 ml] of warm water) to relieve pain or swelling.  Do not drive after taking pain medicine (narcotics).  Do not apply heat to the outside of your face.  Return to your dentist for further treatment as directed. SEEK MEDICAL CARE IF:  Your pain is not helped by medicine.  Your pain is getting worse instead of better. SEEK IMMEDIATE MEDICAL CARE IF:  You have a fever or persistent symptoms for more than 2-3 days.  You have a  fever and your symptoms suddenly get worse.  You have chills or a very bad headache.  You have problems breathing or swallowing.  You have trouble opening your mouth.  You have swelling in the neck or around the eye. Document Released: 06/10/2005 Document Revised: 03/04/2012 Document Reviewed: 09/18/2010 [facility] Patient Information 2015 [place], [place]. This information is not intended to replace advice given to you by your health care provider. Make sure you discuss any questions you have with your health care provider.  Facial Infection You have an infection of your face. This requires special attention to help prevent serious problems. Infections in facial wounds can cause poor healing and scars. They can also spread to deeper tissues, especially around the eye. Wound and dental infections can lead to sinusitis, infection of the eye socket, and even meningitis. Permanent damage to the skin, eye, and nervous system may result if facial infections are not treated properly. With severe infections, hospital care for IV antibiotic injections may be needed if they don't respond to oral antibiotics. Antibiotics must be taken for the full course to insure the infection is eliminated. If the infection came from a bad tooth, it may have to be extracted when the infection is under control. Warm compresses may be applied to reduce skin irritation and remove drainage. You might need a tetanus shot now if:  You cannot remember when your last tetanus shot was.  You have never had a tetanus shot.  The object that caused your wound was dirty. If you need a tetanus shot, and you decide not to get one, there is a rare chance of getting tetanus. Sickness from tetanus can be serious. If you got a tetanus shot, your arm may swell, get red and warm to the touch at the shot site. This is common and not a problem. SEEK IMMEDIATE MEDICAL CARE IF:   You have increased swelling, redness, or trouble  breathing.  You have a severe headache, dizziness, nausea, or vomiting.  You develop problems with your eyesight.  You have a fever. Document Released: 07/18/2004 Document Revised: 09/02/2011 Document Reviewed: 06/10/2005 [facility] Patient Information 2015 [place], [place]. This information is not intended to replace advice given to you by your health care provider. Make sure you discuss any questions you have with your health care provider.  Carpal Tunnel Syndrome The carpal tunnel is a narrow area located on the palm side of your wrist. The tunnel is formed by the wrist bones and ligaments. Nerves, blood vessels, and tendons pass through the carpal tunnel. Repeated wrist motion or certain diseases may cause swelling within the tunnel. This swelling pinches the main nerve in the wrist (median nerve) and causes the painful hand and arm condition called carpal tunnel syndrome. CAUSES   Repeated wrist motions.  Wrist injuries.  Certain diseases like arthritis, diabetes, alcoholism, hyperthyroidism, and kidney failure.  Obesity.  Pregnancy. SYMPTOMS   A "pins and needles" feeling in your fingers or hand, especially in your thumb, index and middle fingers.  Tingling or numbness in your fingers or hand.  An aching feeling in your entire arm, especially when your wrist and elbow are bent for long periods of time.  Wrist pain that goes up your arm to your shoulder.  Pain that goes down into your palm or fingers.  A weak feeling in your hands. DIAGNOSIS  Your health care provider will take your history and perform a physical exam. An electromyography test may be needed. This test measures electrical signals sent out by your nerves into the muscles. The electrical signals are usually slowed by carpal tunnel syndrome. You may also need X-rays. TREATMENT  Carpal tunnel syndrome may clear up by itself. Your health care provider may recommend a wrist splint or medicine such as a nonsteroidal  anti-inflammatory medicine. Cortisone injections may help. Sometimes, surgery may be needed to free the pinched nerve.  HOME CARE INSTRUCTIONS   Take all medicine as directed by your health care provider. Only take over-the-counter or prescription medicines for pain, discomfort, or fever as directed by your health care provider.  If you were given a splint to keep your wrist from bending, wear it as directed. It is important to wear the splint at night. Wear the splint for as long as you have pain or numbness in your hand, arm, or wrist. This may take 1 to 2 months.  Rest your wrist from any activity that may be causing your pain. If your symptoms are work-related, you may need to talk to your employer about changing to a job that does not require using your wrist.  Put ice on your wrist after long periods of wrist activity.  Put ice in a plastic bag.  Place a towel between your skin and the bag.  Leave the ice on for 15-20 minutes, 03-04 times a day.  Keep all follow-up visits as directed by your health care provider. This includes any orthopedic referrals, physical therapy, and rehabilitation. Any delay  in getting necessary care could result in a delay or failure of your condition to heal. SEEK IMMEDIATE MEDICAL CARE IF:   You have new, unexplained symptoms.  Your symptoms get worse and are not helped or controlled with medicines. MAKE SURE YOU:   Understand these instructions.  Will watch your condition.  Will get help right away if you are not doing well or get worse. Document Released: 06/07/2000 Document Revised: 10/25/2013 Document Reviewed: 04/26/2011 [facility] Patient Information 2015 [place], [place]. This information is not intended to replace advice given to you by your health care provider. Make sure you discuss any questions you have with your health care provider.  Hypertension Hypertension, commonly called high blood pressure, is when the force of blood pumping through  your arteries is too strong. Your arteries are the blood vessels that carry blood from your heart throughout your body. A blood pressure reading consists of a higher number over a lower number, such as 110/72. The higher number (systolic) is the pressure inside your arteries when your heart pumps. The lower number (diastolic) is the pressure inside your arteries when your heart relaxes. Ideally you want your blood pressure below 120/80. Hypertension forces your heart to work harder to pump blood. Your arteries may become narrow or stiff. Having hypertension puts you at risk for heart disease, stroke, and other problems.  RISK FACTORS Some risk factors for high blood pressure are controllable. Others are not.  Risk factors you cannot control include:   Race. You may be at higher risk if you are African American.  Age. Risk increases with age.  Gender. Men are at higher risk than women before age 68 years. After age 69, women are at higher risk than men. Risk factors you can control include:  Not getting enough exercise or physical activity.  Being overweight.  Getting too much fat, sugar, calories, or salt in your diet.  Drinking too much alcohol. SIGNS AND SYMPTOMS Hypertension does not usually cause signs or symptoms. Extremely high blood pressure (hypertensive crisis) may cause headache, anxiety, shortness of breath, and nosebleed. DIAGNOSIS  To check if you have hypertension, your health care provider will measure your blood pressure while you are seated, with your arm held at the level of your heart. It should be measured at least twice using the same arm. Certain conditions can cause a difference in blood pressure between your right and left arms. A blood pressure reading that is higher than normal on one occasion does not mean that you need treatment. If one blood pressure reading is high, ask your health care provider about having it checked again. TREATMENT  Treating high blood  pressure includes making lifestyle changes and possibly taking medicine. Living a healthy lifestyle can help lower high blood pressure. You may need to change some of your habits. Lifestyle changes may include:  Following the DASH diet. This diet is high in fruits, vegetables, and whole grains. It is low in salt, red meat, and added sugars.  Getting at least 2 hours of brisk physical activity every week.  Losing weight if necessary.  Not smoking.  Limiting alcoholic beverages.  Learning ways to reduce stress. If lifestyle changes are not enough to get your blood pressure under control, your health care provider may prescribe medicine. You may need to take more than one. Work closely with your health care provider to understand the risks and benefits. HOME CARE INSTRUCTIONS  Have your blood pressure rechecked as directed by your health care provider.  Take medicines only as directed by your health care provider. Follow the directions carefully. Blood pressure medicines must be taken as prescribed. The medicine does not work as well when you skip doses. Skipping doses also puts you at risk for problems.   Do not smoke.   Monitor your blood pressure at home as directed by your health care provider. SEEK MEDICAL CARE IF:   You think you are having a reaction to medicines taken.  You have recurrent headaches or feel dizzy.  You have swelling in your ankles.  You have trouble with your vision. SEEK IMMEDIATE MEDICAL CARE IF:  You develop a severe headache or confusion.  You have unusual weakness, numbness, or feel faint.  You have severe chest or abdominal pain.  You vomit repeatedly.  You have trouble breathing. MAKE SURE YOU:   Understand these instructions.  Will watch your condition.  Will get help right away if you are not doing well or get worse. Document Released: 06/10/2005 Document Revised: 10/25/2013 Document Reviewed: 04/02/2013 [facility] Patient  Information 2015 [place], [place]. This information is not intended to replace advice given to you by your health care provider. Make sure you discuss any questions you have with your health care provider.

## 2014-04-02 NOTE — ED Notes (Signed)
Pt came in to ED with c/o of swelling to left side of neck, pt noticed this morning. Pt denies difficulty swallowing, painful to swallow, and SHOB.  Pt also c/o bilateral thumb pain x5 months. Pt states it has been getting worse. States she had carpal tunnel in right hand, but now pain is in both thumbs.

## 2014-04-02 NOTE — ED Notes (Signed)
She c/o bilat. Hand pain, especially at thumb areas.  She states she has known carpal tunnel issues; and that it became worse after the recent birth of her child.  She is in no distress.

## 2014-04-02 NOTE — ED Provider Notes (Signed)
CSN: [id number]     Arrival date & time 04/02/14  3762 History   First MD Initiated Contact with Patient 04/02/14 [phone]     Chief Complaint  Patient presents with  . Hand Pain     (Consider location/radiation/quality/duration/timing/severity/associated sxs/prior Treatment) HPI Comments: This is a 33 y/o female with a PMHx of HTN, PIH, seizures, anemia, anxiety and carpal tunnel syndrome who presents to the ED complaining of left sided facial swelling x 1 day that she noticed when she woke up this morning. Pt reports she had a "crook" in the front of her neck 2 days ago which has since subsided. Denies dental pain, sore throat, fever or difficulty swallowing. No aggravating or alleviating factors. Pt also complaining of worsening bilateral thumb pain since giving birth. States she has a history of carpal tunnel and wears wrist splints at night. Pain worse when she tries to move her thumbs. She does not take any medications for her carpal tunnel.  Patient is a 33 y.o. female presenting with hand pain. The history is provided by the patient.  Hand Pain    Past Medical History  Diagnosis Date  . Hypertension   . Headache(784.0)     migraines  . Chlamydia   . Pregnancy induced hypertension     with first  . Seizures     No meds, off since in mid to late 20's  . Anemia   . Infection     UTI  . Anxiety     panic attacks  . History of C-section   . Carpal tunnel syndrome, bilateral    Past Surgical History  Procedure Laterality Date  . Cesarean section    . Mouth surgery    . Tubal ligation N/A 10/30/2013    Procedure: POST PARTUM TUBAL LIGATION;  Surgeon: [name], MD;  Location: [facility];  Service: Gynecology;  Laterality: N/A;   Family History  Problem Relation Age of Onset  . Seizures Mother   . Hypertension Mother   . Diabetes Mother   . Heart disease Mother   . Stroke Mother   . Seizures Brother   . Hearing loss Neg Hx    History  Substance Use Topics  . Smoking  status: Former Research scientist (life sciences)  . Smokeless tobacco: Never Used  . Alcohol Use: No     Comment: socially    OB History   Grav Para Term Preterm Abortions TAB SAB Ect Mult Living   4 3 2 1 1 1    3      Review of Systems  HENT: Positive for facial swelling.   Musculoskeletal:       +BL hand pain.  All other systems reviewed and are negative.     Allergies  Amoxicillin  Home Medications   Prior to Admission medications   Medication Sig Start Date End Date Taking? Authorizing Provider  amLODipine-benazepril (LOTREL) 10-40 MG per capsule Take 1 capsule by mouth daily. 12/09/13   Lavonia Drafts, MD  clindamycin (CLEOCIN) 300 MG capsule Take 1 capsule (300 mg total) by mouth 4 (four) times daily. X 7 days 04/02/14   Carman Ching, PA-C  hydrochlorothiazide (HYDRODIURIL) 25 MG tablet Take 25 mg by mouth daily.    Historical Provider, MD  labetalol (NORMODYNE) 300 MG tablet Take 600 mg by mouth 2 (two) times daily. 11/01/13   Florian Buff, MD  naproxen (NAPROSYN) 500 MG tablet Take 1 tablet (500 mg total) by mouth 2 (two) times daily. 04/02/14  Carman Ching, PA-C  Prenatal Vit-Fe Fumarate-FA (PRENATAL MULTIVITAMIN) TABS tablet Take 1 tablet by mouth daily at 12 noon.    Historical Provider, MD   BP 167/107  Pulse 82  Temp(Src) 98.1 F (36.7 C) (Oral)  Resp 16  SpO2 100%  LMP 03/08/2014 Physical Exam  Nursing note and vitals reviewed. Constitutional: She is oriented to person, place, and time. She appears well-developed and well-nourished. No distress.  HENT:  Head: Normocephalic and atraumatic.  Mouth/Throat: Oropharynx is clear and moist.  Erythema and edema over gingiva lower left area where 3rd molar would be present. Left sided facial swelling with slight area of induration concerning for developing abscess. No visible abscess. Swallows secretions well. L submandibular adenopathy.  Eyes: Conjunctivae and EOM are normal. Pupils are equal, round, and reactive to light.  Neck:  Normal range of motion. Neck supple.  Cardiovascular: Normal rate, regular rhythm and normal heart sounds.   Pulmonary/Chest: Effort normal and breath sounds normal. No respiratory distress.  Musculoskeletal: Normal range of motion. She exhibits no edema.  Pain with ROM of bilateral thumbs. No swelling or deformity. Cap refill < 3 seconds. Positive tinel's sign bilateral.  Neurological: She is alert and oriented to person, place, and time. No sensory deficit.  Skin: Skin is warm and dry.  Psychiatric: She has a normal mood and affect. Her behavior is normal.    ED Course  Procedures (including critical care time) Labs Review Labs Reviewed - No data to display  Imaging Review No results found.   EKG Interpretation None      MDM   Final diagnoses:  Dental abscess  Carpal tunnel syndrome, bilateral  Elevated blood pressure   Patient nontoxic-appearing and in no apparent distress. Afebrile, hypertensive, vitals otherwise stable. Swallows secretions well. Will treat with clinda, f/u with dentist. Regarding carpal tunnel, advised her to take anti-inflammatories and continue to use the wrist splints, followup with orthopedics. Blood pressure noted to be elevated at this visit, patient admits to medication noncompliance, however states she does have the medication at home. I discussed importance of medication compliance for control of her blood pressure. Denies chest pain, shortness of breath, headache or vision change. Advised followup with PCP. Stable for discharge. Return precautions given. Patient states understanding of treatment care plan and is agreeable.  Carman Ching, PA-C 04/02/14 [phone]

## 2014-04-02 NOTE — ED Provider Notes (Signed)
Medical screening examination/treatment/procedure(s) were performed by non-physician practitioner and as supervising physician I was immediately available for consultation/collaboration.   EKG Interpretation None      Rolland Porter, MD, Abram Sander   Janice Norrie, MD 04/02/14 [phone]

## 2014-04-08 ENCOUNTER — Encounter (HOSPITAL_COMMUNITY): Payer: Self-pay | Admitting: Emergency Medicine

## 2014-04-08 ENCOUNTER — Emergency Department (HOSPITAL_COMMUNITY)
Admission: EM | Admit: 2014-04-08 | Discharge: 2014-04-08 | Disposition: A | Discharge status: Home / Self Care | Payer: Medicaid Other | Attending: Emergency Medicine | Admitting: Emergency Medicine

## 2014-04-08 DIAGNOSIS — I10 Essential (primary) hypertension: Secondary | ICD-10-CM | POA: Insufficient documentation

## 2014-04-08 DIAGNOSIS — H5789 Other specified disorders of eye and adnexa: Secondary | ICD-10-CM

## 2014-04-08 DIAGNOSIS — H53149 Visual discomfort, unspecified: Secondary | ICD-10-CM | POA: Insufficient documentation

## 2014-04-08 DIAGNOSIS — Z792 Long term (current) use of antibiotics: Secondary | ICD-10-CM | POA: Insufficient documentation

## 2014-04-08 DIAGNOSIS — Z8669 Personal history of other diseases of the nervous system and sense organs: Secondary | ICD-10-CM | POA: Insufficient documentation

## 2014-04-08 DIAGNOSIS — Z8619 Personal history of other infectious and parasitic diseases: Secondary | ICD-10-CM | POA: Insufficient documentation

## 2014-04-08 DIAGNOSIS — Z87448 Personal history of other diseases of urinary system: Secondary | ICD-10-CM | POA: Insufficient documentation

## 2014-04-08 DIAGNOSIS — Z87891 Personal history of nicotine dependence: Secondary | ICD-10-CM | POA: Insufficient documentation

## 2014-04-08 DIAGNOSIS — D649 Anemia, unspecified: Secondary | ICD-10-CM | POA: Insufficient documentation

## 2014-04-08 DIAGNOSIS — Z880 Allergy status to penicillin: Secondary | ICD-10-CM | POA: Insufficient documentation

## 2014-04-08 DIAGNOSIS — Z79899 Other long term (current) drug therapy: Secondary | ICD-10-CM | POA: Insufficient documentation

## 2014-04-08 DIAGNOSIS — Z791 Long term (current) use of non-steroidal anti-inflammatories (NSAID): Secondary | ICD-10-CM | POA: Insufficient documentation

## 2014-04-08 DIAGNOSIS — Z8659 Personal history of other mental and behavioral disorders: Secondary | ICD-10-CM | POA: Insufficient documentation

## 2014-04-08 DIAGNOSIS — R220 Localized swelling, mass and lump, head: Secondary | ICD-10-CM | POA: Insufficient documentation

## 2014-04-08 MED ORDER — PREDNISONE 20 MG PO TABS: 40.0000 mg | ORAL_TABLET | Freq: Every day | ORAL | Status: DC

## 2014-04-08 MED ORDER — FAMOTIDINE 20 MG PO TABS: 20.0000 mg | ORAL_TABLET | Freq: Two times a day (BID) | ORAL | Status: DC

## 2014-04-08 MED ORDER — FLUORESCEIN SODIUM 1 MG OP STRP
1.0000 | ORAL_STRIP | Freq: Once | OPHTHALMIC | Status: AC
  Administered 2014-04-08: 1 via OPHTHALMIC
  Filled 2014-04-08: qty 1

## 2014-04-08 MED ORDER — PREDNISONE 20 MG PO TABS
50.0000 mg | ORAL_TABLET | Freq: Once | ORAL | Status: AC
  Administered 2014-04-08: 50 mg via ORAL
  Filled 2014-04-08: qty 3

## 2014-04-08 MED ORDER — TETRACAINE HCL 0.5 % OP SOLN
1.0000 [drp] | Freq: Once | OPHTHALMIC | Status: AC
  Administered 2014-04-08: 1 [drp] via OPHTHALMIC
  Filled 2014-04-08: qty 2

## 2014-04-08 NOTE — ED Provider Notes (Signed)
CSN: [id number]     Arrival date & time 04/08/14  0830 History  This chart was scribed for non-physician practitioner, Cleatrice Burke, PA-C,working with Francine Graven, DO, by Marlowe Kays, ED Scribe. This patient was seen in room TR08C/TR08C and the patient's care was started at 9:09 AM.  Chief Complaint  Patient presents with  . Facial Swelling   The history is provided by the patient. No language interpreter was used.   HPI Comments:  Mary Roberts is a 33 y.o. female with PMHx of HTN, seizures, and migraines who presents to the Emergency Department complaining of left-sided facial swelling that started approximately one week ago. Pt states she was seen here last week and was prescribed Clindamycin for a dental abscess in which she has been taking as directed. She states she is having continued swelling and soreness on bilateral cheeks that wraps around her chin like a "strap". She states she believes she may have conjunctivitis as well secondary to bilateral eyelid swelling and right-eye pinkness. She states she took her son to the physician and was told she may have allergic conjunctivitis. She reports associated photophobia of both eyes as well. Denies crusting or drainage of the eyes, nausea, vomiting, visual disturbance or changes or rashes.   Past Medical History  Diagnosis Date  . Hypertension   . Headache(784.0)     migraines  . Chlamydia   . Pregnancy induced hypertension     with first  . Seizures     No meds, off since in mid to late 20's  . Anemia   . Infection     UTI  . Anxiety     panic attacks  . History of C-section   . Carpal tunnel syndrome, bilateral    Past Surgical History  Procedure Laterality Date  . Cesarean section    . Mouth surgery    . Tubal ligation N/A 10/30/2013    Procedure: POST PARTUM TUBAL LIGATION;  Surgeon: [name], MD;  Location: [facility];  Service: Gynecology;  Laterality: N/A;   Family History  Problem Relation Age of Onset   . Seizures Mother   . Hypertension Mother   . Diabetes Mother   . Heart disease Mother   . Stroke Mother   . Seizures Brother   . Hearing loss Neg Hx    History  Substance Use Topics  . Smoking status: Former Research scientist (life sciences)  . Smokeless tobacco: Never Used  . Alcohol Use: No     Comment: socially    OB History   Grav Para Term Preterm Abortions TAB SAB Ect Mult Living   4 3 2 1 1 1    3      Review of Systems  HENT: Positive for facial swelling.   Eyes: Positive for photophobia and redness. Negative for pain, discharge and visual disturbance.  Gastrointestinal: Negative for nausea and vomiting.  Skin: Negative for rash.  All other systems reviewed and are negative.   Allergies  Amoxicillin  Home Medications   Prior to Admission medications   Medication Sig Start Date End Date Taking? Authorizing Provider  acetaminophen (TYLENOL) 500 MG tablet Take 1,000 mg by mouth once as needed for mild pain or headache.    Historical Provider, MD  clindamycin (CLEOCIN) 300 MG capsule Take 1 capsule (300 mg total) by mouth 4 (four) times daily. X 7 days 04/02/14   Carman Ching, PA-C  labetalol (NORMODYNE) 300 MG tablet Take 600 mg by mouth 2 (two) times daily.  11/01/13   Florian Buff, MD  naproxen (NAPROSYN) 500 MG tablet Take 1 tablet (500 mg total) by mouth 2 (two) times daily. 04/02/14   Carman Ching, PA-C  Prenatal Vit-Fe Fumarate-FA (PRENATAL MULTIVITAMIN) TABS tablet Take 1 tablet by mouth daily at 12 noon.    Historical Provider, MD   Triage Vitals: BP 164/95  Pulse 77  Temp(Src) 98.2 F (36.8 C) (Oral)  Resp 20  Ht 5\' 2"  (1.575 m)  Wt 174 lb (78.926 kg)  BMI 31.82 kg/m2  SpO2 100%  LMP 03/08/2014 Physical Exam  Nursing note and vitals reviewed. Constitutional: She is oriented to person, place, and time. She appears well-developed and well-nourished. No distress.  HENT:  Head: Normocephalic and atraumatic.  Right Ear: External ear normal.  Left Ear: External ear normal.   Nose: Nose normal.  Mouth/Throat: Oropharynx is clear and moist.  No trismus, submental edema, or tongue elevation  Eyes: Conjunctivae and EOM are normal. Pupils are equal, round, and reactive to light. No foreign body present in the right eye. No foreign body present in the left eye. Right conjunctiva is not injected. Left conjunctiva is not injected.  Slit lamp exam:      The right eye shows no corneal abrasion, no corneal flare, no corneal ulcer, no foreign body and no fluorescein uptake.       The left eye shows no corneal abrasion, no corneal flare, no corneal ulcer, no foreign body and no fluorescein uptake.  Swelling to upper eyelids bilaterally. IOP in right eye 18, left eye 15 No fluorescein dye uptake  Neck: Normal range of motion.  Cardiovascular: Normal rate, regular rhythm and normal heart sounds.   Pulmonary/Chest: Effort normal and breath sounds normal. No stridor. No respiratory distress. She has no wheezes. She has no rales.  Abdominal: Soft. She exhibits no distension.  Musculoskeletal: Normal range of motion.  Neurological: She is alert and oriented to person, place, and time. She has normal strength.  Skin: Skin is warm and dry. She is not diaphoretic. No erythema.  Psychiatric: She has a normal mood and affect. Her behavior is normal.    ED Course  Procedures (including critical care time) DIAGNOSTIC STUDIES: Oxygen Saturation is 100% on RA, normal by my interpretation.   COORDINATION OF CARE: 9:14 AM- Will apply tetracaine drops and fluorescein dye to check for abrasion and will check IOPs. Pt verbalizes understanding and agrees to plan.  Medications  tetracaine (PONTOCAINE) 0.5 % ophthalmic solution 1 drop (1 drop Both Eyes Given by Other 04/08/14 0927)  fluorescein ophthalmic strip 1 strip (1 strip Both Eyes Given by Other 04/08/14 0927)    Labs Review Labs Reviewed - No data to display  Imaging Review No results found.   EKG Interpretation None       MDM   Final diagnoses:  Eye swelling   Patient presents to ED for evaluation of bilateral eye swelling and redness. Patient without fluorescein dye uptake, normal IOP. Appears allergic in nature. Will give prednisone. Encouraged benadryl and pepcid. She will follow up with PCP. Discussed reasons to return to ED immediately. Vital signs stable for discharge. Patient / Family / Caregiver informed of clinical course, understand medical decision-making process, and agree with plan.   I personally performed the services described in this documentation, which was scribed in my presence. The recorded information has been reviewed and is accurate.    Elwyn Lade, PA-C 04/13/14 [phone]

## 2014-04-08 NOTE — ED Notes (Signed)
Patient states she has "allergen conjunctivitis".  I asked if she had been previously diagnosed, she stated not this time.  Patient states she had before.   Patient has swelling to both eyes.   Eyes appear pink, but not red.   No drainage per patient and no other symptoms.

## 2014-04-13 NOTE — ED Provider Notes (Signed)
Medical screening examination/treatment/procedure(s) were performed by non-physician practitioner and as supervising physician I was immediately available for consultation/collaboration.   EKG Interpretation None        Francine Graven, DO 04/13/14 1643

## 2014-04-22 ENCOUNTER — Encounter: Payer: Self-pay | Admitting: General Practice

## 2014-04-25 ENCOUNTER — Encounter (HOSPITAL_COMMUNITY): Payer: Self-pay | Admitting: Emergency Medicine

## 2014-05-25 ENCOUNTER — Encounter: Payer: Self-pay | Admitting: Family Medicine

## 2014-05-26 ENCOUNTER — Encounter: Payer: Self-pay | Admitting: Obstetrics & Gynecology

## 2016-07-14 ENCOUNTER — Observation Stay (HOSPITAL_COMMUNITY): Payer: Medicaid Other

## 2016-07-14 ENCOUNTER — Emergency Department (HOSPITAL_COMMUNITY): Payer: Medicaid Other

## 2016-07-14 ENCOUNTER — Observation Stay (HOSPITAL_COMMUNITY)
Admission: EM | Admit: 2016-07-14 | Discharge: 2016-07-15 | Disposition: A | Discharge status: Home / Self Care | Payer: Medicaid Other | Attending: Internal Medicine | Admitting: Internal Medicine

## 2016-07-14 ENCOUNTER — Encounter (HOSPITAL_COMMUNITY): Payer: Self-pay | Admitting: Emergency Medicine

## 2016-07-14 DIAGNOSIS — D500 Iron deficiency anemia secondary to blood loss (chronic): Secondary | ICD-10-CM | POA: Diagnosis not present

## 2016-07-14 DIAGNOSIS — G40909 Epilepsy, unspecified, not intractable, without status epilepticus: Secondary | ICD-10-CM | POA: Diagnosis not present

## 2016-07-14 DIAGNOSIS — S72142A Displaced intertrochanteric fracture of left femur, initial encounter for closed fracture: Secondary | ICD-10-CM | POA: Diagnosis present

## 2016-07-14 DIAGNOSIS — E876 Hypokalemia: Secondary | ICD-10-CM | POA: Diagnosis not present

## 2016-07-14 DIAGNOSIS — Z880 Allergy status to penicillin: Secondary | ICD-10-CM | POA: Insufficient documentation

## 2016-07-14 DIAGNOSIS — Z8742 Personal history of other diseases of the female genital tract: Secondary | ICD-10-CM | POA: Diagnosis present

## 2016-07-14 DIAGNOSIS — D649 Anemia, unspecified: Secondary | ICD-10-CM

## 2016-07-14 DIAGNOSIS — J189 Pneumonia, unspecified organism: Secondary | ICD-10-CM | POA: Diagnosis present

## 2016-07-14 DIAGNOSIS — I10 Essential (primary) hypertension: Secondary | ICD-10-CM | POA: Insufficient documentation

## 2016-07-14 DIAGNOSIS — Z87898 Personal history of other specified conditions: Secondary | ICD-10-CM

## 2016-07-14 DIAGNOSIS — N920 Excessive and frequent menstruation with regular cycle: Secondary | ICD-10-CM | POA: Diagnosis not present

## 2016-07-14 DIAGNOSIS — O1410 Severe pre-eclampsia, unspecified trimester: Secondary | ICD-10-CM | POA: Diagnosis present

## 2016-07-14 LAB — CBC
HCT: 26.4 % — ABNORMAL LOW (ref 36.0–46.0)
HEMOGLOBIN: 7.9 g/dL — AB (ref 12.0–15.0)
MCH: 21.6 pg — ABNORMAL LOW (ref 26.0–34.0)
MCHC: 29.9 g/dL — AB (ref 30.0–36.0)
MCV: 72.3 fL — ABNORMAL LOW (ref 78.0–100.0)
Platelets: 338 10*3/uL (ref 150–400)
RBC: 3.65 MIL/uL — AB (ref 3.87–5.11)
RDW: 21 % — ABNORMAL HIGH (ref 11.5–15.5)
WBC: 7.3 10*3/uL (ref 4.0–10.5)

## 2016-07-14 LAB — CBC WITH DIFFERENTIAL/PLATELET
BASOS PCT: 0 %
Basophils Absolute: 0 10*3/uL (ref 0.0–0.1)
Eosinophils Absolute: 0 10*3/uL (ref 0.0–0.7)
Eosinophils Relative: 0 %
HCT: 17.2 % — ABNORMAL LOW (ref 36.0–46.0)
HEMOGLOBIN: 4.6 g/dL — AB (ref 12.0–15.0)
LYMPHS PCT: 18 %
Lymphs Abs: 1.4 10*3/uL (ref 0.7–4.0)
MCH: 17.7 pg — AB (ref 26.0–34.0)
MCHC: 26.7 g/dL — ABNORMAL LOW (ref 30.0–36.0)
MCV: 66.2 fL — ABNORMAL LOW (ref 78.0–100.0)
Monocytes Absolute: 0.5 10*3/uL (ref 0.1–1.0)
Monocytes Relative: 7 %
NEUTROS ABS: 5.7 10*3/uL (ref 1.7–7.7)
Neutrophils Relative %: 75 %
Platelets: 386 10*3/uL (ref 150–400)
RBC: 2.6 MIL/uL — ABNORMAL LOW (ref 3.87–5.11)
RDW: 18.7 % — ABNORMAL HIGH (ref 11.5–15.5)
WBC: 7.6 10*3/uL (ref 4.0–10.5)

## 2016-07-14 LAB — COMPREHENSIVE METABOLIC PANEL
ALK PHOS: 38 U/L (ref 38–126)
ALT: 9 U/L — AB (ref 14–54)
AST: 19 U/L (ref 15–41)
Albumin: 3.8 g/dL (ref 3.5–5.0)
Anion gap: 9 (ref 5–15)
BUN: 8 mg/dL (ref 6–20)
CALCIUM: 8.6 mg/dL — AB (ref 8.9–10.3)
CO2: 23 mmol/L (ref 22–32)
CREATININE: 0.85 mg/dL (ref 0.44–1.00)
Chloride: 104 mmol/L (ref 101–111)
GFR calc non Af Amer: >60 mL/min (ref >60)
GLUCOSE: 98 mg/dL (ref 65–99)
Potassium: 3.3 mmol/L — ABNORMAL LOW (ref 3.5–5.1)
Sodium: 136 mmol/L (ref 135–145)
TOTAL PROTEIN: 7.3 g/dL (ref 6.5–8.1)
Total Bilirubin: 0.4 mg/dL (ref 0.3–1.2)

## 2016-07-14 LAB — TROPONIN I

## 2016-07-14 LAB — URINALYSIS, ROUTINE W REFLEX MICROSCOPIC
BILIRUBIN URINE: NEGATIVE
Glucose, UA: NEGATIVE mg/dL
HGB URINE DIPSTICK: NEGATIVE
KETONES UR: NEGATIVE mg/dL
NITRITE: NEGATIVE
PH: 7 (ref 5.0–8.0)
Protein, ur: 30 mg/dL — AB
Specific Gravity, Urine: 1.014 (ref 1.005–1.030)

## 2016-07-14 LAB — STREP PNEUMONIAE URINARY ANTIGEN: STREP PNEUMO URINARY ANTIGEN: NEGATIVE

## 2016-07-14 LAB — RESPIRATORY PANEL BY PCR
Adenovirus: NOT DETECTED
BORDETELLA PERTUSSIS-RVPCR: NOT DETECTED
CHLAMYDOPHILA PNEUMONIAE-RVPPCR: NOT DETECTED
CORONAVIRUS 229E-RVPPCR: NOT DETECTED
CORONAVIRUS HKU1-RVPPCR: NOT DETECTED
Coronavirus NL63: NOT DETECTED
Coronavirus OC43: NOT DETECTED
Influenza A: NOT DETECTED
Influenza B: NOT DETECTED
MYCOPLASMA PNEUMONIAE-RVPPCR: NOT DETECTED
Metapneumovirus: NOT DETECTED
PARAINFLUENZA VIRUS 2-RVPPCR: NOT DETECTED
Parainfluenza Virus 1: NOT DETECTED
Parainfluenza Virus 3: NOT DETECTED
Parainfluenza Virus 4: NOT DETECTED
RESPIRATORY SYNCYTIAL VIRUS-RVPPCR: NOT DETECTED
Rhinovirus / Enterovirus: NOT DETECTED

## 2016-07-14 LAB — HEMOGLOBIN AND HEMATOCRIT, BLOOD
HEMATOCRIT: 16.8 % — AB (ref 36.0–46.0)
HEMOGLOBIN: 4.6 g/dL — AB (ref 12.0–15.0)

## 2016-07-14 LAB — RETICULOCYTES
RBC.: 2.58 MIL/uL — ABNORMAL LOW (ref 3.87–5.11)
Retic Count, Absolute: 38.7 10*3/uL (ref 19.0–186.0)
Retic Ct Pct: 1.5 % (ref 0.4–3.1)

## 2016-07-14 LAB — FOLATE: FOLATE: 13 ng/mL (ref >5.9)

## 2016-07-14 LAB — ABO/RH: ABO/RH(D): O POS

## 2016-07-14 LAB — IRON AND TIBC
IRON: 9 ug/dL — AB (ref 28–170)
Saturation Ratios: 2 % — ABNORMAL LOW (ref 10.4–31.8)
TIBC: 409 ug/dL (ref 250–450)
UIBC: 400 ug/dL

## 2016-07-14 LAB — FERRITIN: Ferritin: 4 ng/mL — ABNORMAL LOW (ref 11–307)

## 2016-07-14 LAB — I-STAT BETA HCG BLOOD, ED (MC, WL, AP ONLY): I-stat hCG, quantitative: <5 m[IU]/mL (ref <5)

## 2016-07-14 LAB — MAGNESIUM: Magnesium: 1.9 mg/dL (ref 1.7–2.4)

## 2016-07-14 LAB — PREPARE RBC (CROSSMATCH)

## 2016-07-14 LAB — VITAMIN B12: VITAMIN B 12: 208 pg/mL (ref 180–914)

## 2016-07-14 LAB — HIV ANTIBODY (ROUTINE TESTING W REFLEX): HIV Screen 4th Generation wRfx: NONREACTIVE

## 2016-07-14 LAB — TSH: TSH: 0.509 u[IU]/mL (ref 0.350–4.500)

## 2016-07-14 MED ORDER — HYDRALAZINE HCL 20 MG/ML IJ SOLN
10.0000 mg | Freq: Once | INTRAMUSCULAR | Status: AC
  Administered 2016-07-14: 10 mg via INTRAVENOUS
  Filled 2016-07-14: qty 1

## 2016-07-14 MED ORDER — WHITE PETROLATUM GEL
Status: DC | PRN
  Administered 2016-07-14: 22:00:00 via TOPICAL
  Filled 2016-07-14: qty 1

## 2016-07-14 MED ORDER — METOCLOPRAMIDE HCL 10 MG PO TABS
10.0000 mg | ORAL_TABLET | Freq: Once | ORAL | Status: DC
  Filled 2016-07-14: qty 1

## 2016-07-14 MED ORDER — CYCLOBENZAPRINE HCL 10 MG PO TABS
10.0000 mg | ORAL_TABLET | Freq: Three times a day (TID) | ORAL | Status: DC | PRN
  Administered 2016-07-14: 10 mg via ORAL
  Filled 2016-07-14: qty 1

## 2016-07-14 MED ORDER — ACETAMINOPHEN 325 MG PO TABS: 650.0000 mg | ORAL_TABLET | Freq: Four times a day (QID) | ORAL | Status: DC | PRN

## 2016-07-14 MED ORDER — ACETAMINOPHEN 500 MG PO TABS
ORAL_TABLET | ORAL | Status: AC
  Filled 2016-07-14: qty 2

## 2016-07-14 MED ORDER — ACETAMINOPHEN 500 MG PO TABS
1000.0000 mg | ORAL_TABLET | Freq: Once | ORAL | Status: AC
  Administered 2016-07-14: 1000 mg via ORAL

## 2016-07-14 MED ORDER — SODIUM CHLORIDE 0.9 % IV SOLN
10.0000 mL/h | Freq: Once | INTRAVENOUS | Status: AC
  Administered 2016-07-14: 10 mL/h via INTRAVENOUS

## 2016-07-14 MED ORDER — LEVOFLOXACIN 750 MG PO TABS
750.0000 mg | ORAL_TABLET | Freq: Every day | ORAL | Status: DC
  Administered 2016-07-14 – 2016-07-15 (×2): 750 mg via ORAL
  Filled 2016-07-14 (×2): qty 1

## 2016-07-14 MED ORDER — LORAZEPAM 0.5 MG PO TABS: 0.5000 mg | ORAL_TABLET | Freq: Once | ORAL | Status: DC | PRN

## 2016-07-14 MED ORDER — ONDANSETRON HCL 4 MG/2ML IJ SOLN: 4.0000 mg | Freq: Four times a day (QID) | INTRAMUSCULAR | Status: DC | PRN

## 2016-07-14 MED ORDER — KETOROLAC TROMETHAMINE 30 MG/ML IJ SOLN
30.0000 mg | Freq: Once | INTRAMUSCULAR | Status: DC
  Filled 2016-07-14: qty 1

## 2016-07-14 MED ORDER — ACETAMINOPHEN 650 MG RE SUPP: 650.0000 mg | Freq: Four times a day (QID) | RECTAL | Status: DC | PRN

## 2016-07-14 MED ORDER — ALBUTEROL SULFATE (2.5 MG/3ML) 0.083% IN NEBU: 2.5000 mg | INHALATION_SOLUTION | RESPIRATORY_TRACT | Status: DC | PRN

## 2016-07-14 MED ORDER — SODIUM CHLORIDE 0.9% FLUSH
3.0000 mL | Freq: Two times a day (BID) | INTRAVENOUS | Status: DC
  Administered 2016-07-14 – 2016-07-15 (×3): 3 mL via INTRAVENOUS

## 2016-07-14 MED ORDER — POTASSIUM CHLORIDE CRYS ER 20 MEQ PO TBCR
40.0000 meq | EXTENDED_RELEASE_TABLET | Freq: Once | ORAL | Status: AC
  Administered 2016-07-14: 40 meq via ORAL
  Filled 2016-07-14: qty 2

## 2016-07-14 MED ORDER — ONDANSETRON HCL 4 MG PO TABS: 4.0000 mg | ORAL_TABLET | Freq: Four times a day (QID) | ORAL | Status: DC | PRN

## 2016-07-14 NOTE — ED Triage Notes (Signed)
Pt. reports fever for 2 days with generalized body aches , arms pain , shoulders pain /neck pain , mild sore throat and  fatigue .

## 2016-07-14 NOTE — ED Notes (Signed)
Patient taken to u/s

## 2016-07-14 NOTE — ED Notes (Signed)
Dr. Claudine Mouton notified on pt.'s low HGB result.

## 2016-07-14 NOTE — ED Notes (Signed)
Patient back from u/s.

## 2016-07-14 NOTE — Progress Notes (Signed)
1300 Received pt from Ed, A&O x4. Afebrile, denies coughing, lungs are clear.  Resp panel sent to lab. Kept pt on droplet precaution. 1420 Second unit of PRBC's started. 1735 Blood transfusion completed, BP 194/94, c/o neck pain/ spasms. Message sent to Dr Aggie Moats. [place] sent again to Dr Aggie Moats, still waiting to call back. Warm compress to back of the neck done.

## 2016-07-14 NOTE — H&P (Signed)
History and Physical    Mary Roberts [id number] DOB: 31-Jan-1981 DOA: 07/14/2016   PCP: Barrie Lyme, FNP /UNASSIGNED  Patient coming from/Resides with: Private residence  Admission status: Observation/telemetry -it may be medically necessary to stay a minimum 2 midnights to rule out impending and/or unexpected changes in physiologic status that may differ from initial evaluation performed in the ER and/or at time of admission therefore consider reevaluation of admission status in 24 hours.   Chief Complaint: Fever and malaise  HPI: Mary Roberts is a 36 y.o. female with medical history significant for severe preeclampsia during first delivery, remote seizure disorder greater than 15 years since last seizure, pregnancy-induced hypertension, and heavy menstrual cycles. The patient presented to the ER reports of fever since Friday (for 2 days) which was associated with generalized body aches, mild sore throat and fatigue. No cough, no rhinorrhea, no nausea, vomiting or diarrhea. During the workup patient was incidentally found to have a hemoglobin of 4.6 which was confirmed on repeat lab as well as microcytosis with an MCV of 66.2. Patient admits to heavy menstrual cycles since her last child was delivered in 2015. Her last known menstrual period was at the beginning of January and lasted for 4 days. She has not followed up with a gynecologist since her routine postdelivery care (she states at least greater than one year). She has noticed over the past several months some dyspnea on exertion but no orthopnea. She has not seen any blood in her stool or urine.  ED Course:  Vital Signs: BP 164/95   Pulse 81   Temp 102.2 F (39 C) (Oral)   Resp 19   LMP 06/30/2016   SpO2 100%  2 view chest x-ray: Hazy lower lobe pneumonia right more than left Lab data: Sodium 136, potassium 3.3, chloride 104, CO2 23, glucose 98, BUN 8, creatinine 0.5, calcium 8.6, LFTs normal, white count 7600 with  normal differential, hemoglobin 4.6, MCV 66.2, platelets 386,000, urinalysis unremarkable except for hazy appearance, rare bacteria, small leukocytes and 30 of protein but otherwise normal, urine culture obtained in the ER, i-STAT hCG quantitative less than 5.0 Medications and treatments: Tylenol 1000 mg 1, 2 units packed red blood cells ordered in the ER  Review of Systems:  In addition to the HPI above,  No Headache, changes with Vision or hearing, new weakness, tingling, numbness in any extremity, dizziness, dysarthria or word finding difficulty, gait disturbance or imbalance, tremors or seizure activity No problems swallowing food or Liquids, indigestion/reflux, choking or coughing while eating, abdominal pain with or after eating No Chest pain, Cough or Shortness of Breath, palpitations, orthopnea  No Abdominal pain, N/V, melena,hematochezia, dark tarry stools, constipation No dysuria, malodorous urine, hematuria or flank pain No new skin rashes, lesions, masses or bruises, No new joint pains, swelling or redness No recent unintentional weight gain or loss No polyuria, polydypsia or polyphagia   Past Medical History:  Diagnosis Date  . Anemia   . Anxiety    panic attacks  . Carpal tunnel syndrome, bilateral   . Chlamydia   . Headache(784.0)    migraines  . History of C-section   . Hypertension   . Infection    UTI  . Pregnancy induced hypertension    with first  . Seizures ([place])    No meds, off since in mid to late 20's    Past Surgical History:  Procedure Laterality Date  . CESAREAN SECTION    . MOUTH SURGERY    .  TUBAL LIGATION N/A 10/30/2013   Procedure: POST PARTUM TUBAL LIGATION;  Surgeon: [name], MD;  Location: [facility];  Service: Gynecology;  Laterality: N/A;    Social History   Social History  . Marital status: Single    Spouse name: N/A  . Number of children: N/A  . Years of education: N/A   Occupational History  . Not on file.   Social History  Main Topics  . Smoking status: Former Research scientist (life sciences)  . Smokeless tobacco: Never Used  . Alcohol use No     Comment: socially   . Drug use: No  . Sexual activity: Yes    Birth control/ protection: None   Other Topics Concern  . Not on file   Social History Narrative  . No narrative on file    Mobility: Without assistive devices Work history: Works as a Freight forwarder   Allergies  Allergen Reactions  . Amoxicillin Hives    Family History  Problem Relation Age of Onset  . Seizures Mother   . Hypertension Mother   . Diabetes Mother   . Heart disease Mother   . Stroke Mother   . Seizures Brother   . Hearing loss Neg Hx      Prior to Admission medications   Medication Sig Start Date End Date Taking? Authorizing Provider  acetaminophen (TYLENOL) 500 MG tablet Take 1,000 mg by mouth once as needed for mild pain or headache.   Yes Historical Provider, MD    Physical Exam: Vitals:   07/14/16 0530 07/14/16 0545 07/14/16 0700 07/14/16 0715  BP: 166/95 163/98 165/96 164/95  Pulse: 87 89 77 81  Resp: 19 21 15 19   Temp:      TempSrc:      SpO2: 100% 99% 100% 100%      Constitutional: NAD, calm, comfortable Eyes: PERRL, lids and conjunctivae normal ENMT: Mucous membranes are moist. Posterior pharynx clear of any exudate or lesions.Normal dentition.  Neck: normal, supple, no masses, no thyromegaly Respiratory: clear to auscultation bilaterally, no wheezing, no crackles.Mildly diminished in the bases Normal respiratory effort. No accessory muscle use.  Cardiovascular: Regular rate and rhythm, no murmurs / rubs / gallops. No extremity edema. 2+ pedal pulses. No carotid bruits.  Abdomen: no tenderness, no masses palpated. No hepatosplenomegaly. Bowel sounds positive.  Musculoskeletal: no clubbing / cyanosis. No joint deformity upper and lower extremities. Good ROM, no contractures. Normal muscle tone.  Skin: no rashes, lesions, ulcers. No induration Neurologic: CN 2-12 grossly  intact. Sensation intact, DTR normal. Strength 5/5 x all 4 extremities.  Psychiatric: Normal judgment and insight. Alert and oriented x 3. Normal mood.    Labs on Admission: I have personally reviewed following labs and imaging studies  CBC:  Recent Labs Lab 07/14/16 0341 07/14/16 0424  WBC 7.6  --   NEUTROABS 5.7  --   HGB 4.6* 4.6*  HCT 17.2* 16.8*  MCV 66.2*  --   PLT 386  --    Basic Metabolic Panel:  Recent Labs Lab 07/14/16 0341  NA 136  K 3.3*  CL 104  CO2 23  GLUCOSE 98  BUN 8  CREATININE 0.85  CALCIUM 8.6*   GFR: CrCl cannot be calculated (Unknown ideal weight.). Liver Function Tests:  Recent Labs Lab 07/14/16 0341  AST 19  ALT 9*  ALKPHOS 38  BILITOT 0.4  PROT 7.3  ALBUMIN 3.8   No results for input(s): LIPASE, AMYLASE in the last 168 hours. No results for input(s): AMMONIA  in the last 168 hours. Coagulation Profile: No results for input(s): INR, PROTIME in the last 168 hours. Cardiac Enzymes: No results for input(s): CKTOTAL, CKMB, CKMBINDEX, TROPONINI in the last 168 hours. BNP (last 3 results) No results for input(s): PROBNP in the last 8760 hours. HbA1C: No results for input(s): HGBA1C in the last 72 hours. CBG: No results for input(s): GLUCAP in the last 168 hours. Lipid Profile: No results for input(s): CHOL, HDL, LDLCALC, TRIG, CHOLHDL, LDLDIRECT in the last 72 hours. Thyroid Function Tests: No results for input(s): TSH, T4TOTAL, FREET4, T3FREE, THYROIDAB in the last 72 hours. Anemia Panel: No results for input(s): VITAMINB12, FOLATE, FERRITIN, TIBC, IRON, RETICCTPCT in the last 72 hours. Urine analysis:    Component Value Date/Time   COLORURINE YELLOW 07/14/2016 0344   APPEARANCEUR HAZY (A) 07/14/2016 0344   LABSPEC 1.014 07/14/2016 0344   PHURINE 7.0 07/14/2016 0344   GLUCOSEU NEGATIVE 07/14/2016 0344   HGBUR NEGATIVE 07/14/2016 0344   BILIRUBINUR NEGATIVE 07/14/2016 0344   KETONESUR NEGATIVE 07/14/2016 0344   PROTEINUR 30  (A) 07/14/2016 0344   UROBILINOGEN 0.2 10/28/2013 0841   NITRITE NEGATIVE 07/14/2016 0344   LEUKOCYTESUR SMALL (A) 07/14/2016 0344   Sepsis Labs: @LABRCNTIP (procalcitonin:4,lacticidven:4) )No results found for this or any previous visit (from the past 240 hour(s)).   Radiological Exams on Admission: Dg Chest 2 View  Result Date: 07/14/2016 CLINICAL DATA:  Fever over the last 2 days. Generalized body aching. EXAM: CHEST  2 VIEW COMPARISON:  03/08/2010 FINDINGS: Heart size is normal. There is hazy pneumonia in both lower lobes right more than left. No dense consolidation or lobar collapse. Upper lungs are clear. No effusions. No significant bone finding. IMPRESSION: Hazy lower lobe pneumonia right more than left. Electronically Signed   By: Nelson Chimes M.D.   On: 07/14/2016 06:40     Assessment/Plan Principal Problem:   Anemia due to chronic blood loss/History of Heavy menses -Known history of heavy menstrual cycles and reports of shortness of breath/dyspnea on exertion with incidental finding on CBC of hemoglobin 4.6 -Reports has not followed up with GYN regarding heavy menstrual cycles -Agree with transfuse PRBCs-CBC after both units infused and again in a.m. -Check TSH and anemia panel -Transvaginal/pelvic ultrasound to evaluate uterine anatomy -Patient reports does not have established GYN and typically receives her care from her PCP  Active Problems:   Bilateral pneumonia -Patient presents with fever,malaise and sore throat as described above -Chest x-ray with early hazy bilateral pneumonia -Penicillin allergic so begin Levaquin -Pneumonia focused order set initiated -Obtain blood cultures as well as sputum culture (if possible) -Not hypoxic -Follow up on respiratory viral panel obtained in ER    Acute hypokalemia -Oral potassium -Labs in a.m.    History of Severe pre-eclampsia/ HTN (hypertension) -Patient presents with blood pressure readings in the hypertensive range  and setting of significant anemia; this may be compensatory -Not on antihypertensive medications since her last pregnancy -Once anemia corrected if remains hypertensive may require initiation of medications    History of seizures/last episode >15 yrs ago -Remote history of seizures without recurrence and not on AEDs      DVT prophylaxis: SCDs Code Status: Full Family Communication: No family at bedside at time of evaluation Disposition Plan: Anticipate discharge back to preadmission home environment when medically stable Consults called: None    Mary Tusing L. ANP-BC [facility] Pager [phone]   If 7PM-7AM, please contact night-coverage [email or web address] Password [facility]  07/14/2016, 7:38 AM

## 2016-07-14 NOTE — ED Notes (Signed)
Taken to xray at this time. 

## 2016-07-14 NOTE — ED Provider Notes (Signed)
[facility] DEPT Provider Note   CSN: [id number] Arrival date & time: 07/14/16  [id number]     History   Chief Complaint Chief Complaint  Patient presents with  . Fever    shoulder pain   . Generalized Body Aches    neck pain     HPI Mary Roberts is a 36 y.o. female past medical history of anemia presenting today with fever. Patient states for the past 3 days she has had fever of unknown origin. She denies any runny nose, cough, respiratory symptoms. She's had no nausea vomiting or diarrhea. She denies pain anywhere. She has no urinary symptoms. She states she does have diffuse body aches. She has been taking Tylenol at home but the fever has not been breaking. The highest at home was 102. Patient denies any sick contacts. There are no further complaints.  10 Systems reviewed and are negative for acute change except as noted in the HPI.   HPI  Past Medical History:  Diagnosis Date  . Anemia   . Anxiety    panic attacks  . Carpal tunnel syndrome, bilateral   . Chlamydia   . Headache(784.0)    migraines  . History of C-section   . Hypertension   . Infection    UTI  . Pregnancy induced hypertension    with first  . Seizures ([place])    No meds, off since in mid to late 20's    Patient Active Problem List   Diagnosis Date Noted  . Symptomatic anemia 07/14/2016  . Carpal tunnel syndrome, bilateral   . Pre-eclampsia, delivered 10/29/2013  . Epilepsy ([place]) 10/28/2013  . Severe pre-eclampsia 10/28/2013  . Previous cesarean delivery, antepartum condition or complication [id number]  . Severe Preeclampsia superimposed on pre-existing hypertension, antepartum 04/29/2013  . Supervision of high risk pregnancy in second trimester 04/29/2013    Past Surgical History:  Procedure Laterality Date  . CESAREAN SECTION    . MOUTH SURGERY    . TUBAL LIGATION N/A 10/30/2013   Procedure: POST PARTUM TUBAL LIGATION;  Surgeon: [name], MD;  Location: [facility];  Service: Gynecology;   Laterality: N/A;    OB History    Gravida Para Term Preterm AB Living   4 3 2 1 1 3    SAB TAB Ectopic Multiple Live Births     1     3       Home Medications    Prior to Admission medications   Medication Sig Start Date End Date Taking? Authorizing Provider  acetaminophen (TYLENOL) 500 MG tablet Take 1,000 mg by mouth once as needed for mild pain or headache.    Historical Provider, MD  clindamycin (CLEOCIN) 300 MG capsule Take 1 capsule (300 mg total) by mouth 4 (four) times daily. X 7 days 04/02/14   Carman Ching, PA-C  famotidine (PEPCID) 20 MG tablet Take 1 tablet (20 mg total) by mouth 2 (two) times daily. 04/08/14   Cleatrice Burke, PA-C  labetalol (NORMODYNE) 300 MG tablet Take 600 mg by mouth 2 (two) times daily. 11/01/13   Florian Buff, MD  naproxen (NAPROSYN) 500 MG tablet Take 1 tablet (500 mg total) by mouth 2 (two) times daily. 04/02/14   Robyn M Hess, PA-C  predniSONE (DELTASONE) 20 MG tablet Take 2 tablets (40 mg total) by mouth daily. 04/08/14   Cleatrice Burke, PA-C  Prenatal Vit-Fe Fumarate-FA (PRENATAL MULTIVITAMIN) TABS tablet Take 1 tablet by mouth daily at 12 noon.    Historical  Provider, MD    Family History Family History  Problem Relation Age of Onset  . Seizures Mother   . Hypertension Mother   . Diabetes Mother   . Heart disease Mother   . Stroke Mother   . Seizures Brother   . Hearing loss Neg Hx     Social History Social History  Substance Use Topics  . Smoking status: Former Research scientist (life sciences)  . Smokeless tobacco: Never Used  . Alcohol use No     Comment: socially      Allergies   Amoxicillin   Review of Systems Review of Systems   Physical Exam Updated Vital Signs BP 163/98   Pulse 89   Temp 102.2 F (39 C) (Oral)   Resp 21   LMP 06/30/2016   SpO2 99%   Physical Exam  Constitutional: She is oriented to person, place, and time. She appears well-developed and well-nourished. No distress.  HENT:  Head: Normocephalic and atraumatic.    Nose: Nose normal.  Mouth/Throat: Oropharynx is clear and moist. No oropharyngeal exudate.  Eyes: EOM are normal. Pupils are equal, round, and reactive to light. No scleral icterus.  Pale conjunctiva bilaterally  Neck: Normal range of motion. Neck supple. No JVD present. No tracheal deviation present. No thyromegaly present.  Cardiovascular: Normal rate, regular rhythm and normal heart sounds.  Exam reveals no gallop and no friction rub.   No murmur heard. Pulmonary/Chest: Effort normal and breath sounds normal. No respiratory distress. She has no wheezes. She exhibits no tenderness.  Abdominal: Soft. Bowel sounds are normal. She exhibits no distension and no mass. There is no tenderness. There is no rebound and no guarding.  Musculoskeletal: Normal range of motion. She exhibits no edema or tenderness.  Lymphadenopathy:    She has no cervical adenopathy.  Neurological: She is alert and oriented to person, place, and time. No cranial nerve deficit. She exhibits normal muscle tone.  Skin: Skin is warm and dry. No rash noted. No erythema. No pallor.  Tactile fever  Nursing note and vitals reviewed.    ED Treatments / Results  Labs (all labs ordered are listed, but only abnormal results are displayed) Labs Reviewed  CBC WITH DIFFERENTIAL/PLATELET - Abnormal; Notable for the following:       Result Value   RBC 2.60 (*)    Hemoglobin 4.6 (*)    HCT 17.2 (*)    MCV 66.2 (*)    MCH 17.7 (*)    MCHC 26.7 (*)    RDW 18.7 (*)    All other components within normal limits  COMPREHENSIVE METABOLIC PANEL - Abnormal; Notable for the following:    Potassium 3.3 (*)    Calcium 8.6 (*)    ALT 9 (*)    All other components within normal limits  URINALYSIS, ROUTINE W REFLEX MICROSCOPIC - Abnormal; Notable for the following:    APPearance HAZY (*)    Protein, ur 30 (*)    Leukocytes, UA SMALL (*)    Bacteria, UA RARE (*)    Squamous Epithelial / LPF 6-30 (*)    All other components within  normal limits  HEMOGLOBIN AND HEMATOCRIT, BLOOD - Abnormal; Notable for the following:    Hemoglobin 4.6 (*)    HCT 16.8 (*)    All other components within normal limits  URINE CULTURE  RESPIRATORY PANEL BY PCR  I-STAT BETA HCG BLOOD, ED ([facility], [facility], [facility] ONLY)  POC OCCULT BLOOD, ED  TYPE AND SCREEN  ABO/RH  PREPARE  RBC (CROSSMATCH)    EKG  EKG Interpretation None       Radiology No results found.  Procedures Procedures (including critical care time)  Medications Ordered in ED Medications  ketorolac (TORADOL) 30 MG/ML injection 30 mg (30 mg Intravenous Not Given 07/14/16 0455)  metoCLOPramide (REGLAN) tablet 10 mg (10 mg Oral Not Given 07/14/16 0455)  0.9 %  sodium chloride infusion (not administered)  acetaminophen (TYLENOL) tablet 1,000 mg (1,000 mg Oral Given 07/14/16 0345)     Initial Impression / Assessment and Plan / ED Course  I have reviewed the triage vital signs and the nursing notes.  Pertinent labs & imaging results that were available during my care of the patient were reviewed by me and considered in my medical decision making (see chart for details).     Patient presents to the emergency department for fever of unknown origin. This is likely a viral process. We'll obtain chest x-ray and UA for evaluation. She has no further history pointing to a source. Hemoglobin is 4.6, I further asked the patient if she had heavy menstrual cycles and she says that this does occur. She recently had a very heavy cycle at the beginning of this month. It has since stopped. She does have some symptoms of anemia including shortness of breath and presyncope. Type and screen was sent, will recheck hemoglobin. Patient given Toradol, Reglan, Benadryl for her symptoms. We'll continue to closely evaluate.  6:11 AM repeat hgb is the same.  I spoke with Dr. Myna Hidalgo for admission.  I have given 2 units of blood.  Symptomatically she feels better from her fever.  Dr. Myna Hidalgo advised to get a resp  viral panel which was ordered.  CRITICAL CARE Performed by: Everlene Balls   Total critical care time: 45 minutes - symptomatic anemia requiring blood transfusion  Critical care time was exclusive of separately billable procedures and treating other patients.  Critical care was necessary to treat or prevent imminent or life-threatening deterioration.  Critical care was time spent personally by me on the following activities: development of treatment plan with patient and/or surrogate as well as nursing, discussions with consultants, evaluation of patient's response to treatment, examination of patient, obtaining history from patient or surrogate, ordering and performing treatments and interventions, ordering and review of laboratory studies, ordering and review of radiographic studies, pulse oximetry and re-evaluation of patient's condition.    Final Clinical Impressions(s) / ED Diagnoses   Final diagnoses:  Symptomatic anemia    New Prescriptions New Prescriptions   No medications on file     Everlene Balls, MD 07/14/16 [phone]

## 2016-07-14 NOTE — ED Notes (Signed)
Patient refusing to have respiratory panel by PCR done. Education complete. Patient placed on droplet precautions.

## 2016-07-15 ENCOUNTER — Encounter (HOSPITAL_COMMUNITY): Payer: Self-pay | Admitting: General Practice

## 2016-07-15 DIAGNOSIS — Z87898 Personal history of other specified conditions: Secondary | ICD-10-CM

## 2016-07-15 DIAGNOSIS — J189 Pneumonia, unspecified organism: Secondary | ICD-10-CM | POA: Diagnosis not present

## 2016-07-15 DIAGNOSIS — D500 Iron deficiency anemia secondary to blood loss (chronic): Secondary | ICD-10-CM | POA: Diagnosis not present

## 2016-07-15 LAB — TYPE AND SCREEN
BLOOD PRODUCT EXPIRATION DATE: 201801282359
Blood Product Expiration Date: 201802082359
ISSUE DATE / TIME: 201801210750
ISSUE DATE / TIME: 201801211406
UNIT TYPE AND RH: 5100
Unit Type and Rh: 5100

## 2016-07-15 LAB — OCCULT BLOOD, POC DEVICE: FECAL OCCULT BLD: NEGATIVE

## 2016-07-15 LAB — BASIC METABOLIC PANEL
ANION GAP: 9 (ref 5–15)
BUN: 10 mg/dL (ref 6–20)
CO2: 23 mmol/L (ref 22–32)
Calcium: 9.4 mg/dL (ref 8.9–10.3)
Chloride: 104 mmol/L (ref 101–111)
Creatinine, Ser: 0.77 mg/dL (ref 0.44–1.00)
GFR calc non Af Amer: >60 mL/min (ref >60)
Glucose, Bld: 104 mg/dL — ABNORMAL HIGH (ref 65–99)
Potassium: 3.7 mmol/L (ref 3.5–5.1)
Sodium: 136 mmol/L (ref 135–145)

## 2016-07-15 LAB — URINE CULTURE

## 2016-07-15 LAB — CBC
HEMATOCRIT: 28.6 % — AB (ref 36.0–46.0)
HEMOGLOBIN: 8.8 g/dL — AB (ref 12.0–15.0)
MCH: 22.1 pg — ABNORMAL LOW (ref 26.0–34.0)
MCHC: 30.8 g/dL (ref 30.0–36.0)
MCV: 71.7 fL — AB (ref 78.0–100.0)
Platelets: 333 10*3/uL (ref 150–400)
RBC: 3.99 MIL/uL (ref 3.87–5.11)
RDW: 20.8 % — ABNORMAL HIGH (ref 11.5–15.5)
WBC: 10.3 10*3/uL (ref 4.0–10.5)

## 2016-07-15 MED ORDER — NORGESTIM-ETH ESTRAD TRIPHASIC 0.18/0.215/0.25 MG-25 MCG PO TABS: 1.0000 | ORAL_TABLET | Freq: Every day | ORAL | 1 refills | Status: DC

## 2016-07-15 MED ORDER — CYANOCOBALAMIN 1000 MCG PO TABS: 1000.0000 ug | ORAL_TABLET | Freq: Every day | ORAL | 0 refills | Status: DC

## 2016-07-15 MED ORDER — VITAMIN B-12 1000 MCG PO TABS
1000.0000 ug | ORAL_TABLET | Freq: Every day | ORAL | Status: DC
  Administered 2016-07-15: 1000 ug via ORAL
  Filled 2016-07-15: qty 1

## 2016-07-15 MED ORDER — LEVOFLOXACIN 750 MG PO TABS: 750.0000 mg | ORAL_TABLET | Freq: Every day | ORAL | 0 refills | Status: DC

## 2016-07-15 MED ORDER — FERROUS SULFATE 325 (65 FE) MG PO TABS: 325.0000 mg | ORAL_TABLET | Freq: Two times a day (BID) | ORAL | 0 refills | Status: DC

## 2016-07-15 MED ORDER — FERROUS SULFATE 325 (65 FE) MG PO TABS: 325.0000 mg | ORAL_TABLET | Freq: Two times a day (BID) | ORAL | Status: DC

## 2016-07-15 NOTE — Progress Notes (Signed)
D/C papers gone over with pt. Prescriptions given to pt. Iv taken out. NO questions/complaints. Pt. D/c'd successfully.

## 2016-07-15 NOTE — Discharge Instructions (Signed)
Center for [facility] in [facility]  [address], [place], [place] [address] Phone [phone]  Call first business day in  February 2018 to schedule an appointment for March 2018

## 2016-07-15 NOTE — Care Management Note (Signed)
Case Management Note  Patient Details  Name: LIMMIE IMM MRN: [id number] Date of Birth: 1980-12-06  Subjective/Objective:                    Action/Plan: Referral to arrange GYN appointment at [facility]   Call:  Center for [facility] in [facility]  [address], [place], [place] [address] Phone [phone] No appointments available until March and March schedule does not open up until February , was told to have patient call in February for appointment .  Patient aware and voiced understanding.   Also provided [facility] letter and explained. Also provided [facility] information and explained they could also help arrange GYN appointment.    Patient voiced understanding to all of above.  Expected Discharge Date:  07/15/16               Expected Discharge Plan:  Home/Self Care  In-House Referral:     Discharge planning Services  CM Consult, [place], Medication Assistance, [facility]  Post Acute Care Choice:    Choice offered to:  Patient  DME Arranged:    DME Agency:     HH Arranged:    [place] Agency:     Status of Service:  Completed, signed off  If discussed at [employer], dates discussed:    Additional Comments:  Marilu Favre, RN 07/15/2016, 11:51 AM

## 2016-07-15 NOTE — Discharge Summary (Signed)
Physician Discharge Summary  Mary Roberts [id number] DOB: 1981/03/19 DOA: 07/14/2016  PCP: Barrie Lyme, FNP  Admit date: 07/14/2016 Discharge date: 07/15/2016   Recommendations for Outpatient Follow-Up:   1. GYN for heavy menstrual bleeding 2. CBC 1 week 3. PCP 1 week for BP check   Discharge Diagnosis:   Principal Problem:   Anemia due to chronic blood loss Active Problems:   History of seizures/last episode >15 yrs ago   History of Severe pre-eclampsia   Bilateral pneumonia   HTN (hypertension)   Acute hypokalemia   Heavy menstrual bleeding   Intertrochanteric fracture of left femur [facility])   Discharge disposition:  Home.   Discharge Condition: Improved.  Diet recommendation:   Regular.  Wound care: None.   History of Present Illness:   Mary Roberts is a 36 y.o. female with medical history significant for severe preeclampsia during first delivery, remote seizure disorder greater than 15 years since last seizure, pregnancy-induced hypertension, and heavy menstrual cycles. The patient presented to the ER reports of fever since Friday (for 2 days) which was associated with generalized body aches, mild sore throat and fatigue. No cough, no rhinorrhea, no nausea, vomiting or diarrhea. During the workup patient was incidentally found to have a hemoglobin of 4.6 which was confirmed on repeat lab as well as microcytosis with an MCV of 66.2. Patient admits to heavy menstrual cycles since her last child was delivered in 2015. Her last known menstrual period was at the beginning of January and lasted for 4 days. She has not followed up with a gynecologist since her routine postdelivery care (she states at least greater than one year). She has noticed over the past several months some dyspnea on exertion but no orthopnea. She has not seen any blood in her stool or urine.   Hospital Course by Problem:   Anemia due to chronic blood loss/History of Heavy menses -Known  history of heavy menstrual cycles and reports of shortness of breath/dyspnea on exertion with incidental finding on CBC of hemoglobin 4.6-- improved after PRBC -PO iron and PO B12 replacement -Reports has not followed up with GYN regarding heavy menstrual cycles -Transvaginal/pelvic ultrasound: No cause for the patient's bleeding identified. Recommend gynecologic consultation as sonohysterography could better assess for an underlying endometrial polyp or mass. -referral to outpatient GYN made by care management     Bilateral pneumonia -Patient presents with fever,malaise and sore throat as described above -Chest x-ray with early hazy bilateral pneumonia -Penicillin allergic so begin Levaquin x 5 days    Acute hypokalemia -replace    History of Severe pre-eclampsia/ HTN (hypertension) -Patient presents with blood pressure readings in the hypertensive range and setting of significant anemia; this may be compensatory -Not on antihypertensive medications since her last pregnancy -appears to be under stress-- children at home alone-- will follow up with PCP 1 week- if still high, start BP medication    History of seizures/last episode >15 yrs ago -Remote history of seizures without recurrence and not on AEDs    Medical Consultants:    None.   Discharge Exam:   Vitals:   07/15/16 0138 07/15/16 0525  BP: (!) 145/82 (!) 173/97  Pulse:  84  Resp:  18  Temp:  98.8 F (37.1 C)   Vitals:   07/14/16 2115 07/14/16 2250 07/15/16 0138 07/15/16 0525  BP: (!) 189/90 (!) 188/102 (!) 145/82 (!) 173/97  Pulse: 82 77  84  Resp:  18  18  Temp: (!)  100.9 F (38.3 C) 98.1 F (36.7 C)  98.8 F (37.1 C)  TempSrc: Oral Oral  Oral  SpO2: 100% 100%  100%  Weight:      Height:        Gen:  NAD    The results of significant diagnostics from this hospitalization (including imaging, microbiology, ancillary and laboratory) are listed below for reference.     Procedures and Diagnostic  Studies:   Dg Chest 2 View  Result Date: 07/14/2016 CLINICAL DATA:  Fever over the last 2 days. Generalized body aching. EXAM: CHEST  2 VIEW COMPARISON:  03/08/2010 FINDINGS: Heart size is normal. There is hazy pneumonia in both lower lobes right more than left. No dense consolidation or lobar collapse. Upper lungs are clear. No effusions. No significant bone finding. IMPRESSION: Hazy lower lobe pneumonia right more than left. Electronically Signed   By: Nelson Chimes M.D.   On: 07/14/2016 06:40   US Transvaginal Non-ob  Result Date: 07/14/2016 CLINICAL DATA:  Heavy bleeding for 1 year. EXAM: TRANSABDOMINAL AND TRANSVAGINAL ULTRASOUND OF PELVIS TECHNIQUE: Both transabdominal and transvaginal ultrasound examinations of the pelvis were performed. Transabdominal technique was performed for global imaging of the pelvis including uterus, ovaries, adnexal regions, and pelvic cul-de-sac. It was necessary to proceed with endovaginal exam following the transabdominal exam to visualize the endometrium and ovaries. COMPARISON:  None FINDINGS: Uterus Measurements: 9.8 x 5.6 x 6.0 cm. No fibroids or other mass visualized. Endometrium Thickness: 6.4 mm.  No focal abnormality visualized. Right ovary Measurements: 3.6 x 2.5 x 3.5 cm. 2.2 cm dominant follicle Left ovary Measurements: 3.3 x 2.5 x 3.0 cm. Normal appearance/no adnexal mass. Other findings No abnormal free fluid. IMPRESSION: 1. No cause for the patient's bleeding identified. Recommend gynecologic consultation as sonohysterography could better assess for an underlying endometrial polyp or mass. Electronically Signed   By: Dorise Bullion III M.D   On: 07/14/2016 12:24   US Pelvis Complete  Result Date: 07/14/2016 CLINICAL DATA:  Heavy bleeding for 1 year. EXAM: TRANSABDOMINAL AND TRANSVAGINAL ULTRASOUND OF PELVIS TECHNIQUE: Both transabdominal and transvaginal ultrasound examinations of the pelvis were performed. Transabdominal technique was performed for  global imaging of the pelvis including uterus, ovaries, adnexal regions, and pelvic cul-de-sac. It was necessary to proceed with endovaginal exam following the transabdominal exam to visualize the endometrium and ovaries. COMPARISON:  None FINDINGS: Uterus Measurements: 9.8 x 5.6 x 6.0 cm. No fibroids or other mass visualized. Endometrium Thickness: 6.4 mm.  No focal abnormality visualized. Right ovary Measurements: 3.6 x 2.5 x 3.5 cm. 2.2 cm dominant follicle Left ovary Measurements: 3.3 x 2.5 x 3.0 cm. Normal appearance/no adnexal mass. Other findings No abnormal free fluid. IMPRESSION: 1. No cause for the patient's bleeding identified. Recommend gynecologic consultation as sonohysterography could better assess for an underlying endometrial polyp or mass. Electronically Signed   By: Dorise Bullion III M.D   On: 07/14/2016 12:24     Labs:   Basic Metabolic Panel:  Recent Labs Lab 07/14/16 0341 07/14/16 0739 07/15/16 0958  NA 136  --  136  K 3.3*  --  3.7  CL 104  --  104  CO2 23  --  23  GLUCOSE 98  --  104*  BUN 8  --  10  CREATININE 0.85  --  0.77  CALCIUM 8.6*  --  9.4  MG  --  1.9  --    GFR Estimated Creatinine Clearance: 95 mL/min (by C-G formula based on SCr  of 0.77 mg/dL). Liver Function Tests:  Recent Labs Lab 07/14/16 0341  AST 19  ALT 9*  ALKPHOS 38  BILITOT 0.4  PROT 7.3  ALBUMIN 3.8   No results for input(s): LIPASE, AMYLASE in the last 168 hours. No results for input(s): AMMONIA in the last 168 hours. Coagulation profile No results for input(s): INR, PROTIME in the last 168 hours.  CBC:  Recent Labs Lab 07/14/16 0341 07/14/16 0424 07/14/16 2022 07/15/16 0958  WBC 7.6  --  7.3 10.3  NEUTROABS 5.7  --   --   --   HGB 4.6* 4.6* 7.9* 8.8*  HCT 17.2* 16.8* 26.4* 28.6*  MCV 66.2*  --  72.3* 71.7*  PLT 386  --  338 333   Cardiac Enzymes:  Recent Labs Lab 07/14/16 0739  TROPONINI <0.03   BNP: Invalid input(s): POCBNP CBG: No results for  input(s): GLUCAP in the last 168 hours. D-Dimer No results for input(s): DDIMER in the last 72 hours. Hgb A1c No results for input(s): HGBA1C in the last 72 hours. Lipid Profile No results for input(s): CHOL, HDL, LDLCALC, TRIG, CHOLHDL, LDLDIRECT in the last 72 hours. Thyroid function studies  Recent Labs  07/14/16 0739  TSH 0.509   Anemia work up  Recent Labs  07/14/16 0739  VITAMINB12 208  FOLATE 13.0  FERRITIN 4*  TIBC 409  IRON 9*  RETICCTPCT 1.5   Microbiology Recent Results (from the past 240 hour(s))  Urine culture     Status: Abnormal   Collection Time: 07/14/16  4:52 AM  Result Value Ref Range Status   Specimen Description URINE, RANDOM  Final   Special Requests NONE  Final   Culture MULTIPLE SPECIES PRESENT, SUGGEST RECOLLECTION (A)  Final   Report Status 07/15/2016 FINAL  Final  Respiratory Panel by PCR     Status: None   Collection Time: 07/14/16  6:06 AM  Result Value Ref Range Status   Adenovirus NOT DETECTED NOT DETECTED Final   Coronavirus 229E NOT DETECTED NOT DETECTED Final   Coronavirus HKU1 NOT DETECTED NOT DETECTED Final   Coronavirus NL63 NOT DETECTED NOT DETECTED Final   Coronavirus OC43 NOT DETECTED NOT DETECTED Final   Metapneumovirus NOT DETECTED NOT DETECTED Final   Rhinovirus / Enterovirus NOT DETECTED NOT DETECTED Final   Influenza A NOT DETECTED NOT DETECTED Final   Influenza B NOT DETECTED NOT DETECTED Final   Parainfluenza Virus 1 NOT DETECTED NOT DETECTED Final   Parainfluenza Virus 2 NOT DETECTED NOT DETECTED Final   Parainfluenza Virus 3 NOT DETECTED NOT DETECTED Final   Parainfluenza Virus 4 NOT DETECTED NOT DETECTED Final   Respiratory Syncytial Virus NOT DETECTED NOT DETECTED Final   Bordetella pertussis NOT DETECTED NOT DETECTED Final   Chlamydophila pneumoniae NOT DETECTED NOT DETECTED Final   Mycoplasma pneumoniae NOT DETECTED NOT DETECTED Final     Discharge Instructions:   Discharge Instructions    Diet general     Complete by:  As directed    Discharge instructions    Complete by:  As directed    Cbc 1 week Outpatient referral to PCP and GYN Do not smoke   Increase activity slowly    Complete by:  As directed      Allergies as of 07/15/2016      Reactions   Amoxicillin Hives      Medication List    TAKE these medications   acetaminophen 500 MG tablet Commonly known as:  TYLENOL Take 1,000 mg  by mouth once as needed for mild pain or headache.   cyanocobalamin 1000 MCG tablet Take 1 tablet (1,000 mcg total) by mouth daily. Start taking on:  07/16/2016   ferrous sulfate 325 (65 FE) MG tablet Take 1 tablet (325 mg total) by mouth 2 (two) times daily with a meal.   levofloxacin 750 MG tablet Commonly known as:  LEVAQUIN Take 1 tablet (750 mg total) by mouth daily. Start taking on:  07/16/2016   Norgestimate-Ethinyl Estradiol Triphasic 0.18/0.215/0.25 MG-25 MCG tab Take 1 tablet by mouth daily.      Follow-up Information    [facility]. Schedule an appointment as soon as possible for a visit.   Contact information: [address] [place] [phone]           Time coordinating discharge: 35 min  Signed:  June Vacha Alison Stalling   [facility] 07/15/2016, 12:02 PM

## 2016-07-19 LAB — CULTURE, BLOOD (ROUTINE X 2)
CULTURE: NO GROWTH
CULTURE: NO GROWTH

## 2017-09-05 ENCOUNTER — Emergency Department
Admission: EM | Admit: 2017-09-05 | Discharge: 2017-09-06 | Disposition: A | Discharge status: Home / Self Care | Payer: Self-pay | Attending: Emergency Medicine | Admitting: Emergency Medicine

## 2017-09-05 ENCOUNTER — Encounter: Payer: Self-pay | Admitting: Emergency Medicine

## 2017-09-05 DIAGNOSIS — I10 Essential (primary) hypertension: Secondary | ICD-10-CM | POA: Insufficient documentation

## 2017-09-05 DIAGNOSIS — Z87891 Personal history of nicotine dependence: Secondary | ICD-10-CM | POA: Insufficient documentation

## 2017-09-05 DIAGNOSIS — Z79899 Other long term (current) drug therapy: Secondary | ICD-10-CM | POA: Insufficient documentation

## 2017-09-05 LAB — COMPREHENSIVE METABOLIC PANEL
ALT: 11 U/L — ABNORMAL LOW (ref 14–54)
ANION GAP: 8 (ref 5–15)
AST: 21 U/L (ref 15–41)
Albumin: 4.3 g/dL (ref 3.5–5.0)
Alkaline Phosphatase: 45 U/L (ref 38–126)
BILIRUBIN TOTAL: 0.6 mg/dL (ref 0.3–1.2)
BUN: 16 mg/dL (ref 6–20)
CHLORIDE: 103 mmol/L (ref 101–111)
CO2: 25 mmol/L (ref 22–32)
Calcium: 9.1 mg/dL (ref 8.9–10.3)
Creatinine, Ser: 0.85 mg/dL (ref 0.44–1.00)
GFR calc Af Amer: >60 mL/min (ref >60)
Glucose, Bld: 84 mg/dL (ref 65–99)
POTASSIUM: 3.1 mmol/L — AB (ref 3.5–5.1)
Sodium: 136 mmol/L (ref 135–145)
TOTAL PROTEIN: 8.3 g/dL — AB (ref 6.5–8.1)

## 2017-09-05 LAB — URINALYSIS, COMPLETE (UACMP) WITH MICROSCOPIC
BILIRUBIN URINE: NEGATIVE
Glucose, UA: NEGATIVE mg/dL
KETONES UR: NEGATIVE mg/dL
LEUKOCYTES UA: NEGATIVE
Nitrite: NEGATIVE
PROTEIN: 30 mg/dL — AB
Specific Gravity, Urine: 1.014 (ref 1.005–1.030)
pH: 7 (ref 5.0–8.0)

## 2017-09-05 LAB — CBC WITH DIFFERENTIAL/PLATELET
BASOS ABS: 0 10*3/uL (ref 0–0.1)
Basophils Relative: 1 %
EOS PCT: 1 %
Eosinophils Absolute: 0 10*3/uL (ref 0–0.7)
HEMATOCRIT: 36.4 % (ref 35.0–47.0)
HEMOGLOBIN: 12.5 g/dL (ref 12.0–16.0)
LYMPHS ABS: 1.1 10*3/uL (ref 1.0–3.6)
Lymphocytes Relative: 23 %
MCH: 30 pg (ref 26.0–34.0)
MCHC: 34.4 g/dL (ref 32.0–36.0)
MCV: 87.3 fL (ref 80.0–100.0)
Monocytes Absolute: 0.4 10*3/uL (ref 0.2–0.9)
Monocytes Relative: 8 %
NEUTROS ABS: 3.4 10*3/uL (ref 1.4–6.5)
NEUTROS PCT: 67 %
PLATELETS: 242 10*3/uL (ref 150–440)
RBC: 4.17 MIL/uL (ref 3.80–5.20)
RDW: 12.9 % (ref 11.5–14.5)
WBC: 5 10*3/uL (ref 3.6–11.0)

## 2017-09-05 MED ORDER — LABETALOL HCL 100 MG PO TABS
100.0000 mg | ORAL_TABLET | Freq: Once | ORAL | Status: AC
  Administered 2017-09-05: 100 mg via ORAL
  Filled 2017-09-05: qty 1

## 2017-09-05 NOTE — ED Triage Notes (Signed)
Pt comes into the ED via POV c/o HTN.  Patient had her birth control changed last week and noticed that it was high.  Patient is asymptomatic at this time with no complaints.  Patient neurologically intact.  Patient has even and unlabored respirations and in NAD.

## 2017-09-05 NOTE — ED Provider Notes (Addendum)
[facility] Emergency Department Provider Note   ____________________________________________   First MD Initiated Contact with Patient 09/05/17 2133     (approximate)  I have reviewed the triage vital signs and the nursing notes.   HISTORY  Chief Complaint Hypertension   HPI Mary Roberts is a 37 y.o. female Patient reports her blood pressures up. She is not symptomatic. She has no headache nausea vomiting chest pain shortness of breath or any other complaints. She came in because she noticed her blood pressure was very high. Here the blood pressures 250/150 she still has no complaints of anything except a high blood pressure. She is very worried about taking any medicine except for labetalol. She says she has been on 4 medicines in the past and that made her sick she doesn't want to take anything except labetalol that she took which was pregnant. I will attempt to start her on that so we can at least begin to get the blood pressure under control.   Past Medical History:  Diagnosis Date  . Anemia   . Anxiety    panic attacks  . Carpal tunnel syndrome, bilateral   . Chlamydia   . Headache(784.0)    migraines  . History of C-section   . Hypertension   . Infection    UTI  . Pregnancy induced hypertension    with first  . Seizures ([place])    No meds, off since in mid to late 20's    Patient Active Problem List   Diagnosis Date Noted  . Anemia due to chronic blood loss 07/14/2016  . Bilateral pneumonia 07/14/2016  . HTN (hypertension) 07/14/2016  . Acute hypokalemia 07/14/2016  . Heavy menstrual bleeding 07/14/2016  . Intertrochanteric fracture of left femur ([place]) 07/14/2016  . Carpal tunnel syndrome, bilateral   . Pre-eclampsia, delivered 10/29/2013  . History of seizures/last episode >15 yrs ago 10/28/2013  . History of Severe pre-eclampsia 10/28/2013  . Previous cesarean delivery, antepartum condition or complication 63/87/5643  . Severe  Preeclampsia superimposed on pre-existing hypertension, antepartum 04/29/2013  . Supervision of high risk pregnancy in second trimester 04/29/2013    Past Surgical History:  Procedure Laterality Date  . CESAREAN SECTION    . MOUTH SURGERY    . TUBAL LIGATION N/A 10/30/2013   Procedure: POST PARTUM TUBAL LIGATION;  Surgeon: [name], MD;  Location: [place] [facility];  Service: Gynecology;  Laterality: N/A;    Prior to Admission medications   Medication Sig Start Date End Date Taking? Authorizing Provider  acetaminophen (TYLENOL) 500 MG tablet Take 1,000 mg by mouth once as needed for mild pain or headache.    [provider]  ferrous sulfate 325 (65 FE) MG tablet Take 1 tablet (325 mg total) by mouth 2 (two) times daily with a meal. 07/15/16   Eliseo Squires, Tomi Bamberger, DO  levofloxacin (LEVAQUIN) 750 MG tablet Take 1 tablet (750 mg total) by mouth daily. 07/16/16   Geradine Girt, DO  Norgestimate-Ethinyl Estradiol Triphasic 0.18/0.215/0.25 MG-25 MCG tab Take 1 tablet by mouth daily. 07/15/16   Geradine Girt, DO  vitamin B-12 1000 MCG tablet Take 1 tablet (1,000 mcg total) by mouth daily. 07/16/16   Geradine Girt, DO    Allergies Amoxicillin  Family History  Problem Relation Age of Onset  . Seizures Mother   . Hypertension Mother   . Diabetes Mother   . Heart disease Mother   . Stroke Mother   . Seizures Brother   .  Hearing loss Neg Hx     Social History Social History   Tobacco Use  . Smoking status: Former Research scientist (life sciences)  . Smokeless tobacco: Never Used  Substance Use Topics  . Alcohol use: No    Comment: socially   . Drug use: No    Review of Systems  Constitutional: No fever/chills Eyes: No visual changes. ENT: No sore throat. Cardiovascular: Denies chest pain. Respiratory: Denies shortness of breath. Gastrointestinal: No abdominal pain.  No nausea, no vomiting.  No diarrhea.  No constipation. Genitourinary: Negative for dysuria. Musculoskeletal: Negative for back  pain. Skin: Negative for rash. Neurological: Negative for headaches, focal weakness   ____________________________________________   PHYSICAL EXAM:  VITAL SIGNS: ED Triage Vitals  Enc Vitals Group     BP 09/05/17 1907 (!) 258/151     Pulse Rate 09/05/17 1907 82     Resp 09/05/17 1907 18     Temp 09/05/17 1907 98.3 F (36.8 C)     Temp Source 09/05/17 1907 Oral     SpO2 09/05/17 1907 99 %     Weight 09/05/17 1908 174 lb (78.9 kg)     Height 09/05/17 1908 5' 2.5" (1.588 m)     Head Circumference --      Peak Flow --      Pain Score --      Pain Loc --      Pain Edu? --      Excl. in [place]? --     Constitutional: Alert and oriented. Well appearing and in no acute distress. Eyes: Conjunctivae are normal. PERRL. EOMI.fundi look normal except for some heart exudates that I saw on the right eye there is no hemorrhage anywhere though. Head: Atraumatic. Nose: No congestion/rhinnorhea. Mouth/Throat: Mucous membranes are moist.  Oropharynx non-erythematous. Neck: No stridor.  Cardiovascular: Normal rate, regular rhythm. Grossly normal heart sounds.  Good peripheral circulation. Respiratory: Normal respiratory effort.  No retractions. Lungs CTAB. Gastrointestinal: Soft and nontender. No distention. No abdominal bruits. No CVA tenderness. Musculoskeletal: No lower extremity tenderness nor edema.  No joint effusions. Neurologic:  Normal speech and language. No gross focal neurologic deficits are appreciated. Skin:  Skin is warm, dry and intact. No rash noted. Psychiatric: Mood and affect are normal. Speech and behavior are normal.  ____________________________________________   LABS (all labs ordered are listed, but only abnormal results are displayed)  Labs Reviewed  COMPREHENSIVE METABOLIC PANEL - Abnormal; Notable for the following components:      Result Value   Potassium 3.1 (*)    Total Protein 8.3 (*)    ALT 11 (*)    All other components within normal limits  URINALYSIS,  COMPLETE (UACMP) WITH MICROSCOPIC - Abnormal; Notable for the following components:   Color, Urine YELLOW (*)    APPearance CLEAR (*)    Hgb urine dipstick LARGE (*)    Protein, ur 30 (*)    Bacteria, UA RARE (*)    Squamous Epithelial / LPF 0-5 (*)    All other components within normal limits  CBC WITH DIFFERENTIAL/PLATELET   ____________________________________________  EKG  KG read and interpreted by me shows normal sinus rhythm at a rate of 76 right axis no acute changes ____________________________________________  RADIOLOGY  ED MD interpretation:   Official radiology report(s): No results found.  ____________________________________________   PROCEDURES  Procedure(s) performed:   Procedures  Critical Care performed:   ____________________________________________   INITIAL IMPRESSION / ASSESSMENT AND PLAN / ED COURSE  ----------------------------------------- 12:10 AM on 09/06/2017 -----------------------------------------  Patient is feeling well she has no complaints. Her blood pressures 194/116. This represents a 60 point drop I will not attempt to drop her blood pressure any further today. I will have her return here or see a doctor tomorrow. I have instructed her to return at once if she does get any symptoms. This includes headache blurry vision chest pain numbness etc. We can work on increasing the labetalol later. patient reports she is on her menses this will count for the hematuria    Clinical Course as of Sep 06 9  Sat Sep 06, 2017  0007 Sodium: 136 [PM]    Clinical Course User Index [PM] Nena Polio, MD     ____________________________________________   FINAL CLINICAL IMPRESSION(S) / ED DIAGNOSES  Final diagnoses:  Hypertension, unspecified type     ED Discharge Orders    None       Note:  This document was prepared using Dragon voice recognition software and may include unintentional dictation errors.    Nena Polio, MD 09/05/17 2314    Nena Polio, MD 09/06/17 0761    Nena Polio, MD 09/06/17 [phone]

## 2017-09-05 NOTE — ED Notes (Signed)
MD and RN at bedside

## 2017-09-05 NOTE — ED Notes (Signed)
Per Dr. Joni Fears, due to the patient being asymptomatic at this time and has a h/o HTN, he declined blood work, EKG, or protocols.

## 2017-09-06 MED ORDER — LABETALOL HCL 100 MG PO TABS: 100.0000 mg | ORAL_TABLET | Freq: Two times a day (BID) | ORAL | 1 refills | Status: DC

## 2017-09-06 NOTE — Discharge Instructions (Signed)
please make sure you take the labetalol 1 pill twice a day, About 12 hours apart. Please return tomorrow or see a doctor and get your blood pressure checked again. Please return at once if you get any symptoms like chest pain bad headache numbness or weakness or blurry vision or any other symptoms.

## 2018-01-04 IMAGING — CR DG CHEST 2V
2 series · 2 of 2 positions shown · non-contrast
Comparison: 03/08/2010

CLINICAL DATA: Fever over the last 2 days. Generalized body aching.

EXAM:
CHEST  2 VIEW

[chest pa]
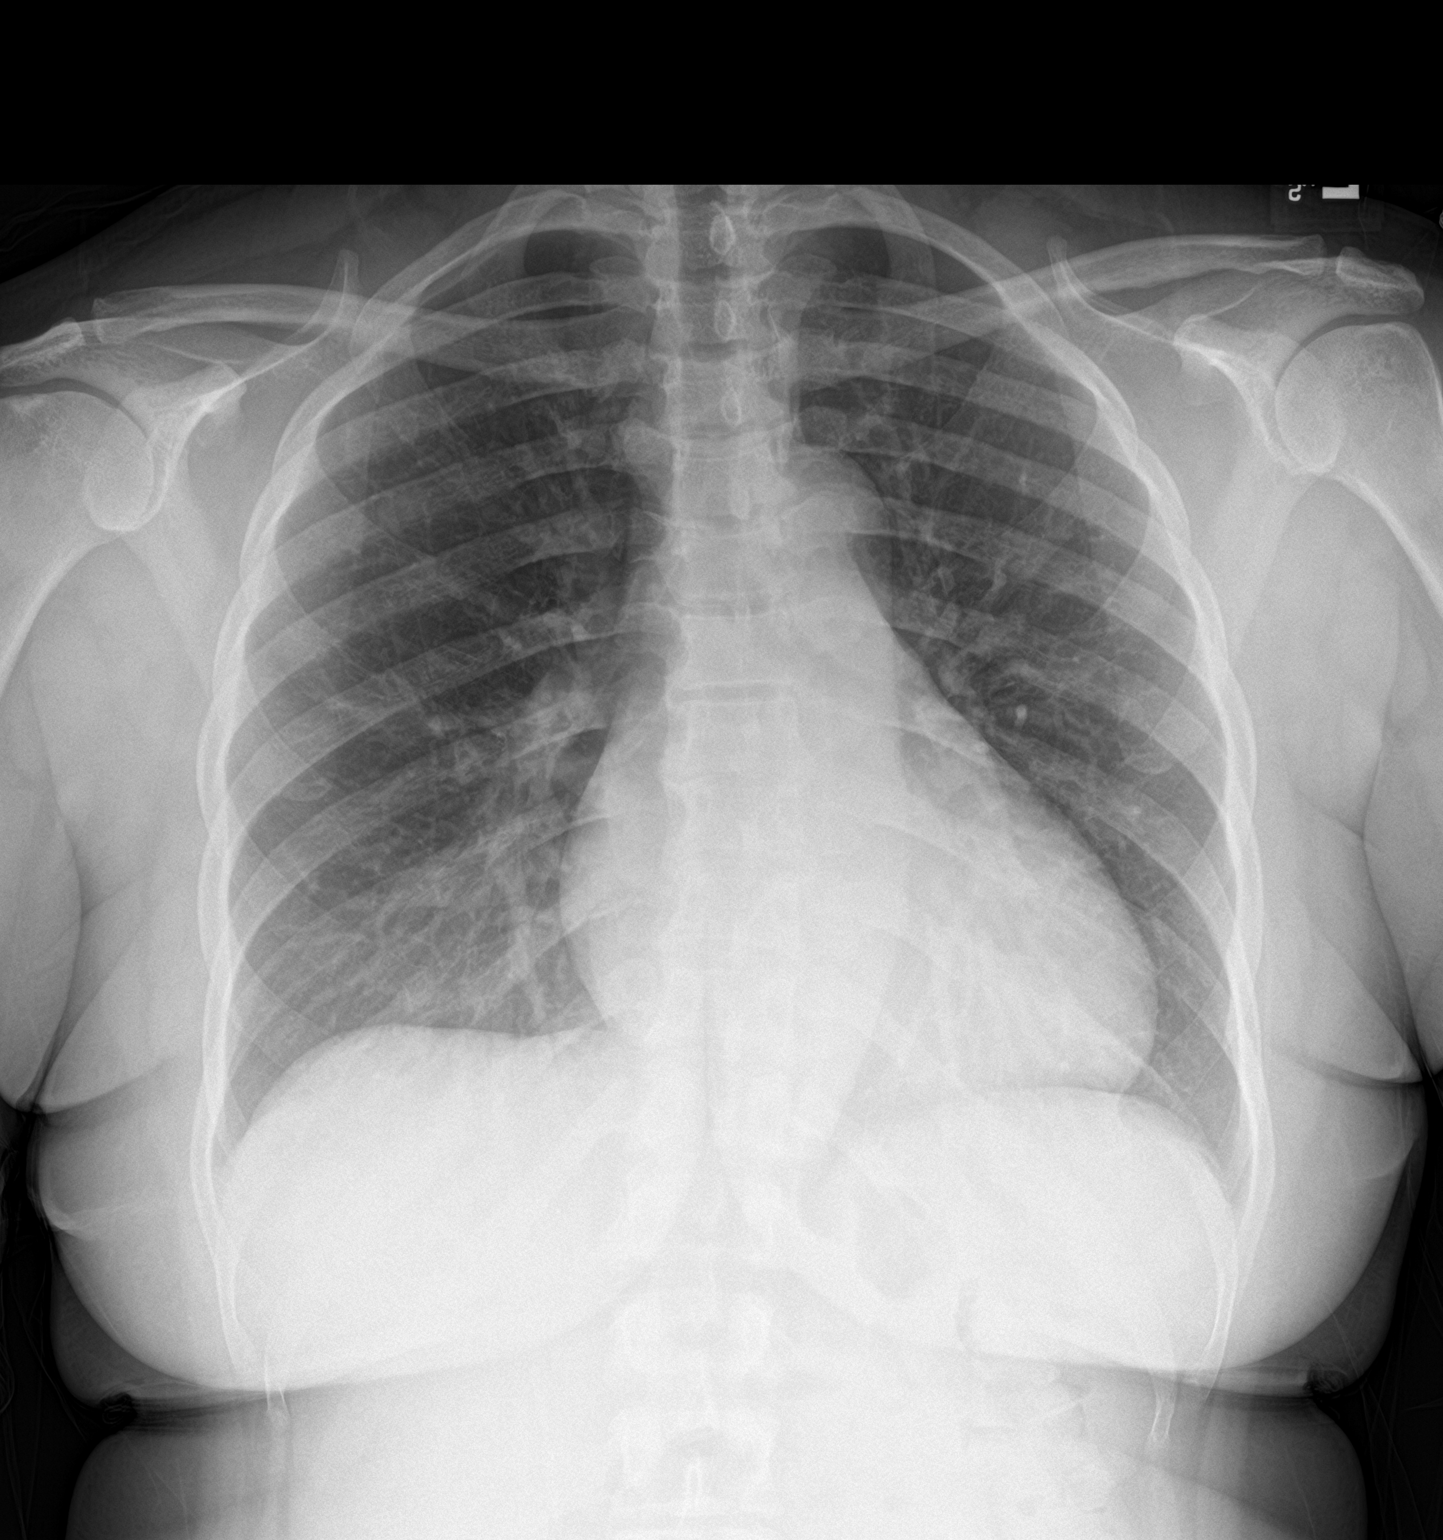

[chest lat]
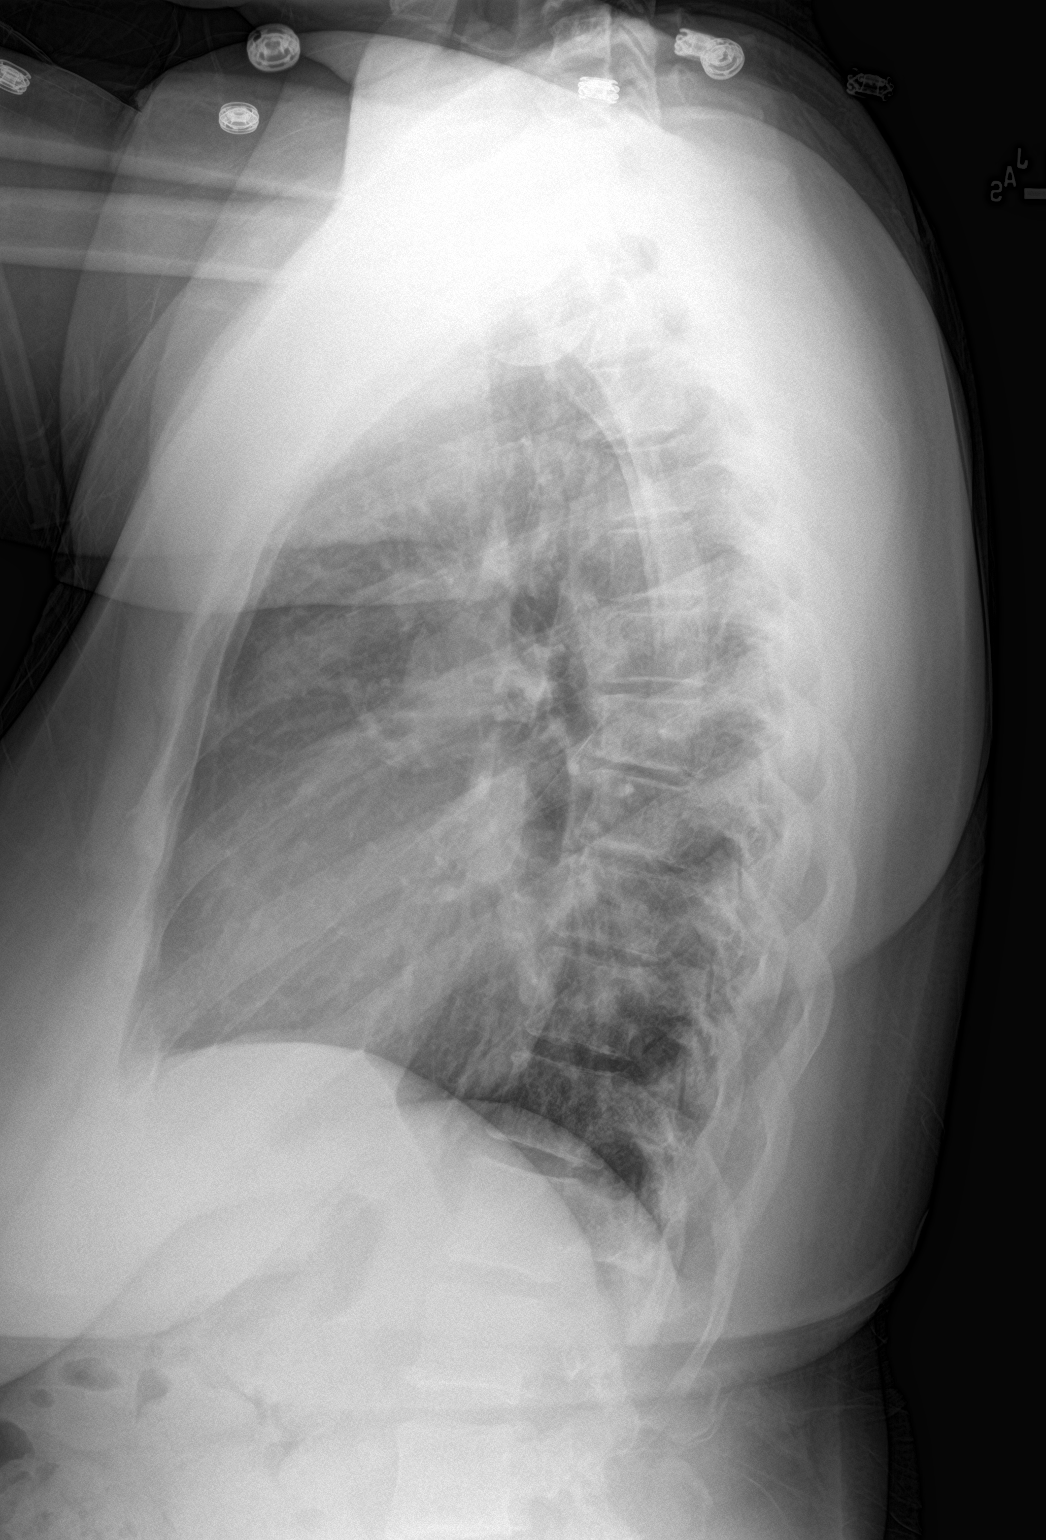

[2 of 2 positions shown; findings below may reference images not displayed]

FINDINGS: Heart size is normal. There is hazy pneumonia in both lower lobes
right more than left. No dense consolidation or lobar collapse.
Upper lungs are clear. No effusions. No significant bone finding.
IMPRESSION: Hazy lower lobe pneumonia right more than left.

## 2018-01-31 ENCOUNTER — Emergency Department
Admission: EM | Admit: 2018-01-31 | Discharge: 2018-01-31 | Disposition: A | Discharge status: Home / Self Care | Payer: Medicaid Other | Attending: Emergency Medicine | Admitting: Emergency Medicine

## 2018-01-31 ENCOUNTER — Emergency Department: Payer: Medicaid Other

## 2018-01-31 ENCOUNTER — Other Ambulatory Visit: Payer: Self-pay

## 2018-01-31 DIAGNOSIS — I10 Essential (primary) hypertension: Secondary | ICD-10-CM | POA: Insufficient documentation

## 2018-01-31 DIAGNOSIS — Z79899 Other long term (current) drug therapy: Secondary | ICD-10-CM | POA: Insufficient documentation

## 2018-01-31 DIAGNOSIS — R519 Headache, unspecified: Secondary | ICD-10-CM

## 2018-01-31 DIAGNOSIS — R51 Headache: Secondary | ICD-10-CM | POA: Insufficient documentation

## 2018-01-31 DIAGNOSIS — Z87891 Personal history of nicotine dependence: Secondary | ICD-10-CM | POA: Insufficient documentation

## 2018-01-31 LAB — BASIC METABOLIC PANEL
ANION GAP: 8 (ref 5–15)
BUN: 16 mg/dL (ref 6–20)
CHLORIDE: 104 mmol/L (ref 98–111)
CO2: 26 mmol/L (ref 22–32)
Calcium: 9.2 mg/dL (ref 8.9–10.3)
Creatinine, Ser: 0.98 mg/dL (ref 0.44–1.00)
GFR calc non Af Amer: >60 mL/min (ref >60)
GLUCOSE: 91 mg/dL (ref 70–99)
Potassium: 3.3 mmol/L — ABNORMAL LOW (ref 3.5–5.1)
Sodium: 138 mmol/L (ref 135–145)

## 2018-01-31 LAB — CBC
HEMATOCRIT: 33.3 % — AB (ref 35.0–47.0)
HEMOGLOBIN: 11.4 g/dL — AB (ref 12.0–16.0)
MCH: 29.2 pg (ref 26.0–34.0)
MCHC: 34.1 g/dL (ref 32.0–36.0)
MCV: 85.8 fL (ref 80.0–100.0)
Platelets: 321 10*3/uL (ref 150–440)
RBC: 3.88 MIL/uL (ref 3.80–5.20)
RDW: 14.1 % (ref 11.5–14.5)
WBC: 6.8 10*3/uL (ref 3.6–11.0)

## 2018-01-31 LAB — POCT PREGNANCY, URINE: PREG TEST UR: NEGATIVE

## 2018-01-31 LAB — TROPONIN I: Troponin I: <0.03 ng/mL (ref <0.03)

## 2018-01-31 MED ORDER — METOCLOPRAMIDE HCL 5 MG/ML IJ SOLN
10.0000 mg | Freq: Once | INTRAMUSCULAR | Status: AC
  Administered 2018-01-31: 10 mg via INTRAVENOUS
  Filled 2018-01-31: qty 2

## 2018-01-31 MED ORDER — LABETALOL HCL 5 MG/ML IV SOLN
10.0000 mg | Freq: Once | INTRAVENOUS | Status: AC
  Administered 2018-01-31: 10 mg via INTRAVENOUS
  Filled 2018-01-31: qty 4

## 2018-01-31 MED ORDER — KETOROLAC TROMETHAMINE 30 MG/ML IJ SOLN
15.0000 mg | Freq: Once | INTRAMUSCULAR | Status: AC
  Administered 2018-01-31: 15 mg via INTRAVENOUS
  Filled 2018-01-31: qty 1

## 2018-01-31 MED ORDER — LABETALOL HCL 100 MG PO TABS
100.0000 mg | ORAL_TABLET | Freq: Once | ORAL | Status: AC
  Administered 2018-01-31: 100 mg via ORAL
  Filled 2018-01-31: qty 1

## 2018-01-31 MED ORDER — LABETALOL HCL 100 MG PO TABS: 100.0000 mg | ORAL_TABLET | Freq: Two times a day (BID) | ORAL | 5 refills | Status: DC

## 2018-01-31 NOTE — Discharge Instructions (Addendum)

## 2018-01-31 NOTE — ED Provider Notes (Signed)
[facility] Emergency Department Provider Note  ____________________________________________  Time seen: Approximately 8:56 PM  I have reviewed the triage vital signs and the nursing notes.   HISTORY  Chief Complaint Headache   HPI Mary Roberts is a 37 y.o. female with a history of anemia, hypertension, and preeclampsia who presents for evaluation of headache and elevated blood pressure.  Patient reports that she does not have insurance and does not have a primary care doctor.  She ran out of her prescription for labetalol several months ago and has been off of it.  For the last 2 days she has had a right-sided headache that she describes as sharp/throbbing, constant and severe.  She has been taking Tylenol and Aleve at home with no relief.  Headaches currently 10 out of 10.  She denies thunderclap headache and reports that the headache started mild and got progressively worse over several hours.  No changes in vision, nausea, vomiting, neck stiffness, fever, dizziness.  She denies chest pain or shortness of breath.  Past Medical History:  Diagnosis Date  . Anemia   . Anxiety    panic attacks  . Carpal tunnel syndrome, bilateral   . Chlamydia   . Headache(784.0)    migraines  . History of C-section   . Hypertension   . Infection    UTI  . Pregnancy induced hypertension    with first  . Seizures ([place])    No meds, off since in mid to late 20's    Patient Active Problem List   Diagnosis Date Noted  . Anemia due to chronic blood loss 07/14/2016  . Bilateral pneumonia 07/14/2016  . HTN (hypertension) 07/14/2016  . Acute hypokalemia 07/14/2016  . Heavy menstrual bleeding 07/14/2016  . Intertrochanteric fracture of left femur ([place]) 07/14/2016  . Carpal tunnel syndrome, bilateral   . Pre-eclampsia, delivered 10/29/2013  . History of seizures/last episode >15 yrs ago 10/28/2013  . History of Severe pre-eclampsia 10/28/2013  . Previous cesarean  delivery, antepartum condition or complication 41/93/7902  . Severe Preeclampsia superimposed on pre-existing hypertension, antepartum 04/29/2013  . Supervision of high risk pregnancy in second trimester 04/29/2013    Past Surgical History:  Procedure Laterality Date  . CESAREAN SECTION    . MOUTH SURGERY    . TUBAL LIGATION N/A 10/30/2013   Procedure: POST PARTUM TUBAL LIGATION;  Surgeon: [name], MD;  Location: [facility];  Service: Gynecology;  Laterality: N/A;    Prior to Admission medications   Medication Sig Start Date End Date Taking? Authorizing Provider  acetaminophen (TYLENOL) 500 MG tablet Take 1,000 mg by mouth once as needed for mild pain or headache.    [provider]  ferrous sulfate 325 (65 FE) MG tablet Take 1 tablet (325 mg total) by mouth 2 (two) times daily with a meal. 07/15/16   Eulogio Bear U, DO  labetalol (NORMODYNE) 100 MG tablet Take 1 tablet (100 mg total) by mouth 2 (two) times daily. 01/31/18   Rudene Re, MD  levofloxacin (LEVAQUIN) 750 MG tablet Take 1 tablet (750 mg total) by mouth daily. 07/16/16   Geradine Girt, DO  Norgestimate-Ethinyl Estradiol Triphasic 0.18/0.215/0.25 MG-25 MCG tab Take 1 tablet by mouth daily. 07/15/16   Geradine Girt, DO  vitamin B-12 1000 MCG tablet Take 1 tablet (1,000 mcg total) by mouth daily. 07/16/16   Geradine Girt, DO    Allergies Amoxicillin  Family History  Problem Relation Age of Onset  .  Seizures Mother   . Hypertension Mother   . Diabetes Mother   . Heart disease Mother   . Stroke Mother   . Seizures Brother   . Hearing loss Neg Hx     Social History Social History   Tobacco Use  . Smoking status: Former Research scientist (life sciences)  . Smokeless tobacco: Never Used  Substance Use Topics  . Alcohol use: No    Comment: socially   . Drug use: No    Review of Systems  Constitutional: Negative for fever. Eyes: Negative for visual changes. ENT: Negative for sore throat. Neck: No neck pain    Cardiovascular: Negative for chest pain. Respiratory: Negative for shortness of breath. Gastrointestinal: Negative for abdominal pain, vomiting or diarrhea. Genitourinary: Negative for dysuria. Musculoskeletal: Negative for back pain. Skin: Negative for rash. Neurological: Negative for weakness or numbness. + HA Psych: No SI or HI  ____________________________________________   PHYSICAL EXAM:  VITAL SIGNS: ED Triage Vitals  Enc Vitals Group     BP 01/31/18 2002 (!) 259/143     Pulse Rate 01/31/18 1958 77     Resp 01/31/18 1958 18     Temp 01/31/18 1958 99.3 F (37.4 C)     Temp Source 01/31/18 1958 Oral     SpO2 01/31/18 1958 100 %     Weight 01/31/18 2000 180 lb (81.6 kg)     Height 01/31/18 2000 5' 2.5" (1.588 m)     Head Circumference --      Peak Flow --      Pain Score 01/31/18 2000 10     Pain Loc --      Pain Edu? --      Excl. in [place]? --     Constitutional: Alert and oriented. Well appearing and in no apparent distress. HEENT:      Head: Normocephalic and atraumatic.         Eyes: Conjunctivae are normal. Sclera is non-icteric.       Mouth/Throat: Mucous membranes are moist.       Neck: Supple with no signs of meningismus. Cardiovascular: Regular rate and rhythm. No murmurs, gallops, or rubs. 2+ symmetrical distal pulses are present in all extremities. No JVD. Respiratory: Normal respiratory effort. Lungs are clear to auscultation bilaterally. No wheezes, crackles, or rhonchi.  Gastrointestinal: Soft, non tender, and non distended with positive bowel sounds. No rebound or guarding. Musculoskeletal: Nontender with normal range of motion in all extremities. No edema, cyanosis, or erythema of extremities. Neurologic: Normal speech and language. A & O x3, PERRL, EOMI, no nystagmus, CN II-XII intact, motor testing reveals good tone and bulk throughout. There is no evidence of pronator drift or dysmetria. Muscle strength is 5/5 throughout.  Sensory examination is  intact. Gait is normal. Skin: Skin is warm, dry and intact. No rash noted. Psychiatric: Mood and affect are normal. Speech and behavior are normal.  ____________________________________________   LABS (all labs ordered are listed, but only abnormal results are displayed)  Labs Reviewed  CBC - Abnormal; Notable for the following components:      Result Value   Hemoglobin 11.4 (*)    HCT 33.3 (*)    All other components within normal limits  BASIC METABOLIC PANEL - Abnormal; Notable for the following components:   Potassium 3.3 (*)    All other components within normal limits  TROPONIN I  POCT PREGNANCY, URINE   ____________________________________________  EKG   ED ECG REPORT I, Rudene Re, the attending physician, personally viewed  and interpreted this ECG.  Normal sinus rhythm, rate of 78, normal intervals, right axis deviation, LVH, no ST elevations or depressions.  Unchanged from prior from January 2018 ____________________________________________  RADIOLOGY  I have personally reviewed the images performed during this visit and I agree with the Radiologist's read.   Interpretation by Radiologist:  Ct Head Wo Contrast  Result Date: 01/31/2018 CLINICAL DATA:  Hypertension with headache. EXAM: CT HEAD WITHOUT CONTRAST TECHNIQUE: Contiguous axial images were obtained from the base of the skull through the vertex without intravenous contrast. COMPARISON:  None. FINDINGS: Brain: No evidence of acute infarction, hemorrhage, hydrocephalus, extra-axial collection or mass lesion/mass effect. Vascular: No hyperdense vessel or unexpected calcification. Skull: Normal. Negative for fracture or focal lesion. Sinuses/Orbits: No acute finding. Other: None. IMPRESSION: No acute intracranial abnormalities. Electronically Signed   By: Dorise Bullion III M.D   On: 01/31/2018 20:50     ____________________________________________   PROCEDURES  Procedure(s) performed:  None Procedures Critical Care performed:  None ____________________________________________   INITIAL IMPRESSION / ASSESSMENT AND PLAN / ED COURSE   37 y.o. female with a history of anemia, hypertension, and preeclampsia who presents for evaluation of headache and elevated blood pressure.  Patient has been off of her blood pressure medications for several months.  She is completely neurologically intact.  Head CT was done to rule out intracranial hemorrhage as possible cause of patient's headache and that is negative.  Will check labs to rule out endorgan damage.  EKG shows no evidence of ischemia.  Will give IV labetalol and restart patient on p.o. labetalol.  Will treat her headache with Toradol and Reglan  Clinical Course as of Feb 01 2243  Sat Jan 31, 2018  2242 Patient's headache has resolved.  Her blood pressure is 165/85.  There are no signs of endorgan damage.  Will provide patient with 6 months worth of prescription for labetalol.  [facility]  Discussed return precautions for chest pain, severe headache, strokelike symptoms, or shortness of breath.   [CV]    Clinical Course User Index [CV] Alfred Levins [place], MD     As part of my medical decision making, I reviewed the following data within the [facility] notes reviewed and incorporated, Labs reviewed , EKG interpreted , Old EKG reviewed, Old chart reviewed, Radiograph reviewed , Notes from prior ED visits and [place] Controlled Substance Database    Pertinent labs & imaging results that were available during my care of the patient were reviewed by me and considered in my medical decision making (see chart for details).    ____________________________________________   FINAL CLINICAL IMPRESSION(S) / ED DIAGNOSES  Final diagnoses:  Acute nonintractable headache, unspecified headache type  Essential hypertension      NEW MEDICATIONS STARTED  DURING THIS VISIT:  ED Discharge Orders         Ordered    labetalol (NORMODYNE) 100 MG tablet  2 times daily     01/31/18 2243           Note:  This document was prepared using Dragon voice recognition software and may include unintentional dictation errors.    Rudene Re, MD 01/31/18 2245

## 2018-01-31 NOTE — ED Notes (Signed)
Patient reports history of blood pressure problems, but does not take any thing due to no PMD.

## 2018-01-31 NOTE — ED Triage Notes (Signed)
Reports headache for 3 days.  States usually Excedrin helps but not this time.

## 2018-02-01 ENCOUNTER — Encounter: Payer: Self-pay | Admitting: Emergency Medicine

## 2018-02-01 ENCOUNTER — Observation Stay
Admission: EM | Admit: 2018-02-01 | Discharge: 2018-02-02 | Disposition: A | Discharge status: Home / Self Care | Payer: MEDICAID | Attending: Internal Medicine | Admitting: Internal Medicine

## 2018-02-01 ENCOUNTER — Other Ambulatory Visit: Payer: Self-pay

## 2018-02-01 DIAGNOSIS — R519 Headache, unspecified: Secondary | ICD-10-CM

## 2018-02-01 DIAGNOSIS — D649 Anemia, unspecified: Secondary | ICD-10-CM | POA: Insufficient documentation

## 2018-02-01 DIAGNOSIS — Z87891 Personal history of nicotine dependence: Secondary | ICD-10-CM | POA: Insufficient documentation

## 2018-02-01 DIAGNOSIS — E876 Hypokalemia: Secondary | ICD-10-CM | POA: Insufficient documentation

## 2018-02-01 DIAGNOSIS — Z91120 Patient's intentional underdosing of medication regimen due to financial hardship: Secondary | ICD-10-CM | POA: Insufficient documentation

## 2018-02-01 DIAGNOSIS — I10 Essential (primary) hypertension: Principal | ICD-10-CM

## 2018-02-01 DIAGNOSIS — T448X6A Underdosing of centrally-acting and adrenergic-neuron-blocking agents, initial encounter: Secondary | ICD-10-CM | POA: Insufficient documentation

## 2018-02-01 DIAGNOSIS — Z79899 Other long term (current) drug therapy: Secondary | ICD-10-CM | POA: Insufficient documentation

## 2018-02-01 DIAGNOSIS — R51 Headache: Secondary | ICD-10-CM

## 2018-02-01 DIAGNOSIS — Z793 Long term (current) use of hormonal contraceptives: Secondary | ICD-10-CM | POA: Insufficient documentation

## 2018-02-01 MED ORDER — LABETALOL HCL 200 MG PO TABS
200.0000 mg | ORAL_TABLET | Freq: Two times a day (BID) | ORAL | Status: DC
  Administered 2018-02-02: 09:00:00 200 mg via ORAL
  Filled 2018-02-01 (×2): qty 1

## 2018-02-01 MED ORDER — LABETALOL HCL 5 MG/ML IV SOLN
10.0000 mg | Freq: Once | INTRAVENOUS | Status: AC
  Administered 2018-02-01: 10 mg via INTRAVENOUS
  Filled 2018-02-01: qty 4

## 2018-02-01 MED ORDER — ACETAMINOPHEN 500 MG PO TABS
1000.0000 mg | ORAL_TABLET | Freq: Once | ORAL | Status: AC
  Administered 2018-02-01: 1000 mg via ORAL
  Filled 2018-02-01: qty 2

## 2018-02-01 MED ORDER — LABETALOL HCL 100 MG PO TABS
100.0000 mg | ORAL_TABLET | Freq: Once | ORAL | Status: AC
  Administered 2018-02-01: 100 mg via ORAL
  Filled 2018-02-01: qty 1

## 2018-02-01 MED ORDER — HYDRALAZINE HCL 20 MG/ML IJ SOLN
10.0000 mg | Freq: Once | INTRAMUSCULAR | Status: AC
  Administered 2018-02-01: 10 mg via INTRAVENOUS
  Filled 2018-02-01: qty 1

## 2018-02-01 MED ORDER — METOCLOPRAMIDE HCL 5 MG/ML IJ SOLN
10.0000 mg | Freq: Once | INTRAMUSCULAR | Status: DC
  Filled 2018-02-01: qty 2

## 2018-02-01 NOTE — ED Notes (Signed)
Talked to admitting MD about patient's BP and this nurse is going to give 100mg  labetaol tablet (waiting for med from pharmacy) and will wait 12min and recheck BPand call floor to give report and floor nurses will call MD if needed.

## 2018-02-01 NOTE — ED Triage Notes (Signed)
Patient coming in [facility] for hypertension and headache. Patient was seen her yesterday for same. Patient describes headache as 10/10 as taken OTC meds and has not helped and did fill labatolol 100mg  and took morning and evening dose. Patient BP was 259/149 for EMS all other vitals stable.

## 2018-02-01 NOTE — H&P (Signed)
[facility] NAME: Mary Roberts    MR#:  [id number]  DATE OF BIRTH:  May 31, 1981  DATE OF ADMISSION:  02/01/2018  PRIMARY CARE PHYSICIAN: Patient, No Pcp Per   REQUESTING/REFERRING PHYSICIAN: Alfred Levins, MD  CHIEF COMPLAINT:   Chief Complaint  Patient presents with  . Hypertension    HISTORY OF PRESENT ILLNESS:  Mary Roberts  is a 37 y.o. female who presents with chief complaint as above.  She came to ED yesterday with elevated blood pressure and headache.  She was treated with IV medications as well as p.o. meds which controlled her blood pressure at the time and she was sent home.  Patient states that she has not been on any antihypertensives for some time given that she has no insurance and has been unable to afford to see a doctor.  She returns tonight with headache and elevated blood pressure despite filling her prescription yesterday and taking the medication as prescribed.  Hospitalist were called for admission to achieve blood pressure control  PAST MEDICAL HISTORY:   Past Medical History:  Diagnosis Date  . Anemia   . Anxiety    panic attacks  . Carpal tunnel syndrome, bilateral   . Chlamydia   . Headache(784.0)    migraines  . History of C-section   . Hypertension   . Infection    UTI  . Pregnancy induced hypertension    with first  . Seizures ([place])    No meds, off since in mid to late 20's     PAST SURGICAL HISTORY:   Past Surgical History:  Procedure Laterality Date  . CESAREAN SECTION    . MOUTH SURGERY    . TUBAL LIGATION N/A 10/30/2013   Procedure: POST PARTUM TUBAL LIGATION;  Surgeon: [name], MD;  Location: [facility];  Service: Gynecology;  Laterality: N/A;     SOCIAL HISTORY:   Social History   Tobacco Use  . Smoking status: Former Research scientist (life sciences)  . Smokeless tobacco: Never Used  Substance Use Topics  . Alcohol use: No    Comment: socially      FAMILY HISTORY:   Family History   Problem Relation Age of Onset  . Seizures Mother   . Hypertension Mother   . Diabetes Mother   . Heart disease Mother   . Stroke Mother   . Seizures Brother   . Hearing loss Neg Hx      DRUG ALLERGIES:   Allergies  Allergen Reactions  . Amoxicillin Hives    MEDICATIONS AT HOME:   Prior to Admission medications   Medication Sig Start Date End Date Taking? Authorizing Provider  acetaminophen (TYLENOL) 500 MG tablet Take 1,000 mg by mouth once as needed for mild pain or headache.    [provider]  ferrous sulfate 325 (65 FE) MG tablet Take 1 tablet (325 mg total) by mouth 2 (two) times daily with a meal. 07/15/16   Eulogio Bear U, DO  labetalol (NORMODYNE) 100 MG tablet Take 1 tablet (100 mg total) by mouth 2 (two) times daily. 01/31/18   Rudene Re, MD  levofloxacin (LEVAQUIN) 750 MG tablet Take 1 tablet (750 mg total) by mouth daily. 07/16/16   Geradine Girt, DO  Norgestimate-Ethinyl Estradiol Triphasic 0.18/0.215/0.25 MG-25 MCG tab Take 1 tablet by mouth daily. 07/15/16   Geradine Girt, DO  vitamin B-12 1000 MCG tablet Take 1 tablet (1,000 mcg total) by mouth daily. 07/16/16  Geradine Girt, DO    REVIEW OF SYSTEMS:  Review of Systems  Constitutional: Negative for chills, fever, malaise/fatigue and weight loss.  HENT: Negative for ear pain, hearing loss and tinnitus.   Eyes: Negative for blurred vision, double vision, pain and redness.  Respiratory: Negative for cough, hemoptysis and shortness of breath.   Cardiovascular: Negative for chest pain, palpitations, orthopnea and leg swelling.  Gastrointestinal: Negative for abdominal pain, constipation, diarrhea, nausea and vomiting.  Genitourinary: Negative for dysuria, frequency and hematuria.  Musculoskeletal: Negative for back pain, joint pain and neck pain.  Skin:       No acne, rash, or lesions  Neurological: Positive for headaches. Negative for dizziness, tremors, focal weakness and weakness.   Endo/Heme/Allergies: Negative for polydipsia. Does not bruise/bleed easily.  Psychiatric/Behavioral: Negative for depression. The patient is not nervous/anxious and does not have insomnia.      VITAL SIGNS:   Vitals:   02/01/18 2051 02/01/18 2052 02/01/18 2200 02/01/18 2206  BP: (!) 227/127  (!) 221/117 (!) 201/117  Pulse: 73  74 78  Resp: 17     Temp: 98.2 F (36.8 C)     TempSrc: Oral     SpO2: 100%  100% 99%  Weight:  81.6 kg    Height:  5' 2.5" (1.588 m)     Wt Readings from Last 3 Encounters:  02/01/18 81.6 kg  01/31/18 81.6 kg  09/05/17 78.9 kg    PHYSICAL EXAMINATION:  Physical Exam  Vitals reviewed. Constitutional: She is oriented to person, place, and time. She appears well-developed and well-nourished. No distress.  HENT:  Head: Normocephalic and atraumatic.  Mouth/Throat: Oropharynx is clear and moist.  Eyes: Pupils are equal, round, and reactive to light. Conjunctivae and EOM are normal. No scleral icterus.  Neck: Normal range of motion. Neck supple. No JVD present. No thyromegaly present.  Cardiovascular: Normal rate, regular rhythm and intact distal pulses. Exam reveals no gallop and no friction rub.  No murmur heard. Respiratory: Effort normal and breath sounds normal. No respiratory distress. She has no wheezes. She has no rales.  GI: Soft. Bowel sounds are normal. She exhibits no distension. There is no tenderness.  Musculoskeletal: Normal range of motion. She exhibits no edema.  No arthritis, no gout  Lymphadenopathy:    She has no cervical adenopathy.  Neurological: She is alert and oriented to person, place, and time. No cranial nerve deficit.  No dysarthria, no aphasia  Skin: Skin is warm and dry. No rash noted. No erythema.  Psychiatric: She has a normal mood and affect. Her behavior is normal. Judgment and thought content normal.    LABORATORY PANEL:   CBC Recent Labs  Lab 01/31/18 2033  WBC 6.8  HGB 11.4*  HCT 33.3*  PLT 321    ------------------------------------------------------------------------------------------------------------------  Chemistries  Recent Labs  Lab 01/31/18 2033  NA 138  K 3.3*  CL 104  CO2 26  GLUCOSE 91  BUN 16  CREATININE 0.98  CALCIUM 9.2   ------------------------------------------------------------------------------------------------------------------  Cardiac Enzymes Recent Labs  Lab 01/31/18 2033  TROPONINI <0.03   ------------------------------------------------------------------------------------------------------------------  RADIOLOGY:  Ct Head Wo Contrast  Result Date: 01/31/2018 CLINICAL DATA:  Hypertension with headache. EXAM: CT HEAD WITHOUT CONTRAST TECHNIQUE: Contiguous axial images were obtained from the base of the skull through the vertex without intravenous contrast. COMPARISON:  None. FINDINGS: Brain: No evidence of acute infarction, hemorrhage, hydrocephalus, extra-axial collection or mass lesion/mass effect. Vascular: No hyperdense vessel or unexpected calcification. Skull: Normal. Negative  for fracture or focal lesion. Sinuses/Orbits: No acute finding. Other: None. IMPRESSION: No acute intracranial abnormalities. Electronically Signed   By: Dorise Bullion III M.D   On: 01/31/2018 20:50    EKG:   Orders placed or performed during the hospital encounter of 01/31/18  . ED EKG  . ED EKG  . EKG 12-Lead  . EKG 12-Lead  . EKG    IMPRESSION AND PLAN:  Principal Problem:   Uncontrolled hypertension -patient states that the prior dose of p.o. labetalol to control her blood pressure was 200 mg twice daily.  We will increase her labetalol to that dose, additional PRN antihypertensives tonight for initial control.  Blood pressure goal less than 616 systolic tonight, tomorrow can be less than 160/100.  Chart review performed and case discussed with ED provider. Labs, imaging and/or ECG reviewed by provider and discussed with patient/family. Management  plans discussed with the patient and/or family.  DVT PROPHYLAXIS: SubQ lovenox   GI PROPHYLAXIS:  None  ADMISSION STATUS: Observation  CODE STATUS: Full Code Status History    Date Active Date Inactive Code Status Order ID Comments User Context   07/14/2016 1244 07/15/2016 1913 Full Code [id number]  Samella Parr, NP Inpatient   10/29/2013 2318 11/01/2013 1601 Full Code [id number]  Clarisa Schools, MD Inpatient   10/28/2013 1012 10/29/2013 2318 Full Code [id number]  [name], DO Inpatient      TOTAL TIME TAKING CARE OF THIS PATIENT: 40 minutes.   Faizon Capozzi [place] 02/01/2018, 10:28 PM  [facility]  Office  [phone]  CC: Primary care physician; Patient, No Pcp Per  Note:  This document was prepared using Dragon voice recognition software and may include unintentional dictation errors.

## 2018-02-01 NOTE — ED Provider Notes (Signed)
[facility] Emergency Department Provider Note  ____________________________________________  Time seen: Approximately 10:11 PM  I have reviewed the triage vital signs and the nursing notes.   HISTORY  Chief Complaint Hypertension   HPI Mary Roberts is a 37 y.o. female with a history of hypertension who presents for evaluation of headache and elevated blood pressure.  I saw patient in the emergency room yesterday for severely elevated blood pressure and headache.  She had been off of her blood pressure medications for a long time.  She was given IV and p.o. labetalol with a significant improvement of her blood pressure.  She had an EKG, labs, and head CT which were all negative for any acute findings and she was discharged home.  She was given a prescription for labetalol which she filled this morning.  She took the first dose this morning and another one this afternoon.  She reports that her headache returned earlier today.  Initially mild and became severe after several hours.  Headaches sharp and throbbing located in the right side of her head, constant and nonradiating.  The headaches identical to the one she had yesterday.  She then checked her blood pressure which was elevated.  When EMS arrived patient's BP was 259/149.  Past Medical History:  Diagnosis Date  . Anemia   . Anxiety    panic attacks  . Carpal tunnel syndrome, bilateral   . Chlamydia   . Headache(784.0)    migraines  . History of C-section   . Hypertension   . Infection    UTI  . Pregnancy induced hypertension    with first  . Seizures ([place])    No meds, off since in mid to late 20's    Patient Active Problem List   Diagnosis Date Noted  . Anemia due to chronic blood loss 07/14/2016  . Bilateral pneumonia 07/14/2016  . HTN (hypertension) 07/14/2016  . Acute hypokalemia 07/14/2016  . Heavy menstrual bleeding 07/14/2016  . Intertrochanteric fracture of left femur ([place])  07/14/2016  . Carpal tunnel syndrome, bilateral   . Pre-eclampsia, delivered 10/29/2013  . History of seizures/last episode >15 yrs ago 10/28/2013  . History of Severe pre-eclampsia 10/28/2013  . Previous cesarean delivery, antepartum condition or complication 85/27/7824  . Severe Preeclampsia superimposed on pre-existing hypertension, antepartum 04/29/2013  . Supervision of high risk pregnancy in second trimester 04/29/2013    Past Surgical History:  Procedure Laterality Date  . CESAREAN SECTION    . MOUTH SURGERY    . TUBAL LIGATION N/A 10/30/2013   Procedure: POST PARTUM TUBAL LIGATION;  Surgeon: [name], MD;  Location: [facility];  Service: Gynecology;  Laterality: N/A;    Prior to Admission medications   Medication Sig Start Date End Date Taking? Authorizing Provider  acetaminophen (TYLENOL) 500 MG tablet Take 1,000 mg by mouth once as needed for mild pain or headache.    [provider]  ferrous sulfate 325 (65 FE) MG tablet Take 1 tablet (325 mg total) by mouth 2 (two) times daily with a meal. 07/15/16   Eulogio Bear U, DO  labetalol (NORMODYNE) 100 MG tablet Take 1 tablet (100 mg total) by mouth 2 (two) times daily. 01/31/18   Rudene Re, MD  levofloxacin (LEVAQUIN) 750 MG tablet Take 1 tablet (750 mg total) by mouth daily. 07/16/16   Geradine Girt, DO  Norgestimate-Ethinyl Estradiol Triphasic 0.18/0.215/0.25 MG-25 MCG tab Take 1 tablet by mouth daily. 07/15/16   Geradine Girt,  DO  vitamin B-12 1000 MCG tablet Take 1 tablet (1,000 mcg total) by mouth daily. 07/16/16   Geradine Girt, DO    Allergies Amoxicillin  Family History  Problem Relation Age of Onset  . Seizures Mother   . Hypertension Mother   . Diabetes Mother   . Heart disease Mother   . Stroke Mother   . Seizures Brother   . Hearing loss Neg Hx     Social History Social History   Tobacco Use  . Smoking status: Former Research scientist (life sciences)  . Smokeless tobacco: Never Used  Substance Use Topics  .  Alcohol use: No    Comment: socially   . Drug use: No    Review of Systems  Constitutional: Negative for fever. Eyes: Negative for visual changes. ENT: Negative for sore throat. Neck: No neck pain  Cardiovascular: Negative for chest pain. Respiratory: Negative for shortness of breath. Gastrointestinal: Negative for abdominal pain, vomiting or diarrhea. Genitourinary: Negative for dysuria. Musculoskeletal: Negative for back pain. Skin: Negative for rash. Neurological: Negative for weakness or numbness. + HA Psych: No SI or HI  ____________________________________________   PHYSICAL EXAM:  VITAL SIGNS: ED Triage Vitals  Enc Vitals Group     BP 02/01/18 2051 (!) 227/127     Pulse Rate 02/01/18 2051 73     Resp 02/01/18 2051 17     Temp 02/01/18 2051 98.2 F (36.8 C)     Temp Source 02/01/18 2051 Oral     SpO2 02/01/18 2051 100 %     Weight 02/01/18 2052 180 lb (81.6 kg)     Height 02/01/18 2052 5' 2.5" (1.588 m)     Head Circumference --      Peak Flow --      Pain Score 02/01/18 2052 10     Pain Loc --      Pain Edu? --      Excl. in [place]? --     Constitutional: Alert and oriented. Well appearing and in no apparent distress. HEENT:      Head: Normocephalic and atraumatic.         Eyes: Conjunctivae are normal. Sclera is non-icteric.       Mouth/Throat: Mucous membranes are moist.       Neck: Supple with no signs of meningismus. Cardiovascular: Regular rate and rhythm. No murmurs, gallops, or rubs. 2+ symmetrical distal pulses are present in all extremities. No JVD. Respiratory: Normal respiratory effort. Lungs are clear to auscultation bilaterally. No wheezes, crackles, or rhonchi.  Gastrointestinal: Soft, non tender, and non distended with positive bowel sounds. No rebound or guarding. Musculoskeletal: Nontender with normal range of motion in all extremities. No edema, cyanosis, or erythema of extremities. Neurologic: Normal speech and language. A & O x3, PERRL,  EOMI, no nystagmus, CN II-XII intact, motor testing reveals good tone and bulk throughout. There is no evidence of pronator drift or dysmetria. Muscle strength is 5/5 throughout.Sensory examination is intact. Gait deferred Skin: Skin is warm, dry and intact. No rash noted. Psychiatric: Mood and affect are normal. Speech and behavior are normal.  ____________________________________________   LABS (all labs ordered are listed, but only abnormal results are displayed)  Labs Reviewed - No data to display ____________________________________________  EKG  ED ECG REPORT I, Rudene Re, the attending physician, personally viewed and interpreted this ECG.  Normal sinus rhythm, rate of 78, normal intervals, right axis deviation, LVH, no ST elevations or depressions.  Unchanged from prior. ____________________________________________  RADIOLOGY  none  ____________________________________________   PROCEDURES  Procedure(s) performed: None Procedures Critical Care performed:  None ____________________________________________   INITIAL IMPRESSION / ASSESSMENT AND PLAN / ED COURSE  37 y.o. female with a history of hypertension who presents for evaluation of headache and elevated blood pressure.  This is patient's second visit in 2 days to the emergency room for severely elevated blood pressure and headache.  Head CT and labs done yesterday showed no acute findings.  Patient is neurologically intact.  Blood pressure is again elevated at 227/127.  She received a dose of IV labetalol with no significant improvement.  Will give IV hydralazine and admit to the hospitalist for further management.     As part of my medical decision making, I reviewed the following data within the [facility] notes reviewed and incorporated, Labs reviewed , EKG interpreted , Old EKG reviewed, Old chart reviewed, Discussed with admitting physician  Notes from prior ED visits and [place]  Controlled Substance Database    Pertinent labs & imaging results that were available during my care of the patient were reviewed by me and considered in my medical decision making (see chart for details).    ____________________________________________   FINAL CLINICAL IMPRESSION(S) / ED DIAGNOSES  Final diagnoses:  Malignant hypertension  Acute nonintractable headache, unspecified headache type      NEW MEDICATIONS STARTED DURING THIS VISIT:  ED Discharge Orders    None       Note:  This document was prepared using Dragon voice recognition software and may include unintentional dictation errors.    Rudene Re, MD 02/01/18 2214

## 2018-02-02 LAB — BASIC METABOLIC PANEL
ANION GAP: 9 (ref 5–15)
BUN: 12 mg/dL (ref 6–20)
CALCIUM: 8.8 mg/dL — AB (ref 8.9–10.3)
CO2: 25 mmol/L (ref 22–32)
Chloride: 104 mmol/L (ref 98–111)
Creatinine, Ser: 0.79 mg/dL (ref 0.44–1.00)
GFR calc Af Amer: >60 mL/min (ref >60)
GLUCOSE: 119 mg/dL — AB (ref 70–99)
Potassium: 3 mmol/L — ABNORMAL LOW (ref 3.5–5.1)
SODIUM: 138 mmol/L (ref 135–145)

## 2018-02-02 LAB — CBC
HCT: 30.7 % — ABNORMAL LOW (ref 35.0–47.0)
Hemoglobin: 10.4 g/dL — ABNORMAL LOW (ref 12.0–16.0)
MCH: 29 pg (ref 26.0–34.0)
MCHC: 33.8 g/dL (ref 32.0–36.0)
MCV: 85.8 fL (ref 80.0–100.0)
Platelets: 275 10*3/uL (ref 150–440)
RBC: 3.58 MIL/uL — ABNORMAL LOW (ref 3.80–5.20)
RDW: 14.2 % (ref 11.5–14.5)
WBC: 7.2 10*3/uL (ref 3.6–11.0)

## 2018-02-02 MED ORDER — TRAMADOL HCL 50 MG PO TABS
50.0000 mg | ORAL_TABLET | Freq: Four times a day (QID) | ORAL | Status: DC | PRN
  Administered 2018-02-02 (×2): 50 mg via ORAL
  Filled 2018-02-02 (×2): qty 1

## 2018-02-02 MED ORDER — LABETALOL HCL 5 MG/ML IV SOLN
10.0000 mg | INTRAVENOUS | Status: DC | PRN
  Filled 2018-02-02: qty 4

## 2018-02-02 MED ORDER — POTASSIUM CHLORIDE CRYS ER 20 MEQ PO TBCR
40.0000 meq | EXTENDED_RELEASE_TABLET | Freq: Once | ORAL | Status: AC
  Administered 2018-02-02: 40 meq via ORAL
  Filled 2018-02-02: qty 2

## 2018-02-02 MED ORDER — LABETALOL HCL 200 MG PO TABS: 100.0000 mg | ORAL_TABLET | Freq: Two times a day (BID) | ORAL | 0 refills | Status: DC

## 2018-02-02 MED ORDER — ENOXAPARIN SODIUM 40 MG/0.4ML ~~LOC~~ SOLN: 40.0000 mg | SUBCUTANEOUS | Status: DC

## 2018-02-02 MED ORDER — OXYCODONE HCL 5 MG PO TABS: 5.0000 mg | ORAL_TABLET | ORAL | Status: DC | PRN

## 2018-02-02 MED ORDER — HYDRALAZINE HCL 25 MG PO TABS: 25.0000 mg | ORAL_TABLET | Freq: Two times a day (BID) | ORAL | 0 refills | Status: DC

## 2018-02-02 MED ORDER — ONDANSETRON HCL 4 MG/2ML IJ SOLN
4.0000 mg | Freq: Four times a day (QID) | INTRAMUSCULAR | Status: DC | PRN
  Filled 2018-02-02: qty 2

## 2018-02-02 MED ORDER — ACETAMINOPHEN 650 MG RE SUPP: 650.0000 mg | Freq: Four times a day (QID) | RECTAL | Status: DC | PRN

## 2018-02-02 MED ORDER — ACETAMINOPHEN 325 MG PO TABS: 650.0000 mg | ORAL_TABLET | Freq: Four times a day (QID) | ORAL | Status: DC | PRN

## 2018-02-02 MED ORDER — ONDANSETRON HCL 4 MG PO TABS: 4.0000 mg | ORAL_TABLET | Freq: Four times a day (QID) | ORAL | Status: DC | PRN

## 2018-02-02 MED ORDER — HYDRALAZINE HCL 20 MG/ML IJ SOLN
10.0000 mg | INTRAMUSCULAR | Status: DC | PRN
  Administered 2018-02-02: 10 mg via INTRAVENOUS
  Filled 2018-02-02: qty 1

## 2018-02-02 NOTE — Care Management Note (Signed)
Case Management Note  Patient Details  Name: HYUN REALI MRN: [id number] Date of Birth: [birth year]  Subjective/Objective:  Patient is uninsured. Met with her to discuss discharge planning needs. Application given for [facility] and medication management clinics. Explained both clinic processes in detail. Referral emailed to [facility]. Answered patients questions. No further needs identified. Case closed.                    Action/Plan:   Expected Discharge Date:  02/02/18               Expected Discharge Plan:  Home/Self Care  In-House Referral:     Discharge planning Services  CM Consult, [facility], Medication Assistance  Post Acute Care Choice:    Choice offered to:     DME Arranged:    DME Agency:     HH Arranged:    HH Agency:     Status of Service:  Completed, signed off  If discussed at [employer], dates discussed:    Additional Comments:  Jolly Mango, RN 02/02/2018, 9:28 AM

## 2018-02-02 NOTE — Discharge Summary (Signed)
[facility] NAME: Mary Roberts    MR#:  [id number]  DATE OF BIRTH:  10/13/1980  DATE OF ADMISSION:  02/01/2018 ADMITTING PHYSICIAN: Lance Coon, MD  DATE OF DISCHARGE: No discharge date for patient encounter.  PRIMARY CARE PHYSICIAN: Patient, No Pcp Per   ADMISSION DIAGNOSIS:  Malignant hypertension [I10] Acute nonintractable headache, unspecified headache type [R51]  DISCHARGE DIAGNOSIS:  Principal Problem:   Uncontrolled hypertension Headache Acute Hypokalemia  SECONDARY DIAGNOSIS:   Past Medical History:  Diagnosis Date  . Anemia   . Anxiety    panic attacks  . Carpal tunnel syndrome, bilateral   . Chlamydia   . Headache(784.0)    migraines  . History of C-section   . Hypertension   . Infection    UTI  . Pregnancy induced hypertension    with first  . Seizures ([place])    No meds, off since in mid to late 20's     ADMITTING [place]  is a 37 y.o. female who presents with chief complaint as above.  She came to ED yesterday with elevated blood pressure and headache.  She was treated with IV medications as well as p.o. meds which controlled her blood pressure at the time and she was sent home.  Patient states that she has not been on any antihypertensives for some time given that she has no insurance and has been unable to afford to see a doctor.  She returns tonight with headache and elevated blood pressure despite filling her prescription yesterday and taking the medication as prescribed.  Hospitalist were called for admission to achieve blood pressure control  HOSPITAL COURSE:  Patient was admitted to medical floor under observation bed.  Blood pressure was controlled with oral labetalol and PRN IV hydralazine.  Headaches have improved after blood pressure has been controlled.  Patient was worked up with CT head which showed no acute abnormality.  Her potassium was replaced.  Patient will be discharged  on oral hypertensive medication in case management help was taken for arrangement of medications.  CONSULTS OBTAINED:  None  DRUG ALLERGIES:   Allergies  Allergen Reactions  . Amoxicillin Hives and Other (See Comments)    Has patient had a PCN reaction causing immediate rash, facial/tongue/throat swelling, SOB or lightheadedness with hypotension: No Has patient had a PCN reaction causing severe rash involving mucus membranes or skin necrosis: No Has patient had a PCN reaction that required hospitalization: No Has patient had a PCN reaction occurring within the last 10 years: Yes If all of the above answers are "NO", then may proceed with Cephalosporin use.     DISCHARGE MEDICATIONS:   Allergies as of 02/02/2018      Reactions   Amoxicillin Hives, Other (See Comments)   Has patient had a PCN reaction causing immediate rash, facial/tongue/throat swelling, SOB or lightheadedness with hypotension: No Has patient had a PCN reaction causing severe rash involving mucus membranes or skin necrosis: No Has patient had a PCN reaction that required hospitalization: No Has patient had a PCN reaction occurring within the last 10 years: Yes If all of the above answers are "NO", then may proceed with Cephalosporin use.      Medication List    TAKE these medications   aspirin-acetaminophen-caffeine 250-250-65 MG tablet Commonly known as:  EXCEDRIN MIGRAINE Take 1 tablet by mouth every 6 (six) hours as needed for headache.   hydrALAZINE 25 MG tablet Commonly known as:  APRESOLINE Take 1 tablet (25 mg total) by mouth 2 (two) times daily.   labetalol 200 MG tablet Commonly known as:  NORMODYNE Take 0.5 tablets (100 mg total) by mouth 2 (two) times daily. What changed:  medication strength       Today  Patient seen and evaluated on the day of discharge Tolerating diet well No complaints of any chest pain, shortness of breath  VITAL SIGNS:  Blood pressure (!) 166/95, pulse 73,  temperature 98.7 F (37.1 C), temperature source Oral, resp. rate 18, height 5\' 2"  (1.575 m), weight 83.9 kg, last menstrual period 01/18/2018, SpO2 100 %.  I/O:    Intake/Output Summary (Last 24 hours) at 02/02/2018 1043 Last data filed at 02/02/2018 1002 Gross per 24 hour  Intake 240 ml  Output -  Net 240 ml    PHYSICAL EXAMINATION:  Physical Exam  GENERAL:  37 y.o.-year-old patient lying in the bed with no acute distress.  LUNGS: Normal breath sounds bilaterally, no wheezing, rales,rhonchi or crepitation. No use of accessory muscles of respiration.  CARDIOVASCULAR: S1, S2 normal. No murmurs, rubs, or gallops.  ABDOMEN: Soft, non-tender, non-distended. Bowel sounds present. No organomegaly or mass.  NEUROLOGIC: Moves all 4 extremities. PSYCHIATRIC: The patient is alert and oriented x 3.  SKIN: No obvious rash, lesion, or ulcer.   DATA REVIEW:   CBC Recent Labs  Lab 02/02/18 0351  WBC 7.2  HGB 10.4*  HCT 30.7*  PLT 275    Chemistries  Recent Labs  Lab 02/02/18 0351  NA 138  K 3.0*  CL 104  CO2 25  GLUCOSE 119*  BUN 12  CREATININE 0.79  CALCIUM 8.8*    Cardiac Enzymes Recent Labs  Lab 01/31/18 2033  TROPONINI <0.03    Microbiology Results  Results for orders placed or performed during the hospital encounter of 07/14/16  Urine culture     Status: Abnormal   Collection Time: 07/14/16  4:52 AM  Result Value Ref Range Status   Specimen Description URINE, RANDOM  Final   Special Requests NONE  Final   Culture MULTIPLE SPECIES PRESENT, SUGGEST RECOLLECTION (A)  Final   Report Status 07/15/2016 FINAL  Final  Respiratory Panel by PCR     Status: None   Collection Time: 07/14/16  6:06 AM  Result Value Ref Range Status   Adenovirus NOT DETECTED NOT DETECTED Final   Coronavirus 229E NOT DETECTED NOT DETECTED Final   Coronavirus HKU1 NOT DETECTED NOT DETECTED Final   Coronavirus NL63 NOT DETECTED NOT DETECTED Final   Coronavirus OC43 NOT DETECTED NOT  DETECTED Final   Metapneumovirus NOT DETECTED NOT DETECTED Final   Rhinovirus / Enterovirus NOT DETECTED NOT DETECTED Final   Influenza A NOT DETECTED NOT DETECTED Final   Influenza B NOT DETECTED NOT DETECTED Final   Parainfluenza Virus 1 NOT DETECTED NOT DETECTED Final   Parainfluenza Virus 2 NOT DETECTED NOT DETECTED Final   Parainfluenza Virus 3 NOT DETECTED NOT DETECTED Final   Parainfluenza Virus 4 NOT DETECTED NOT DETECTED Final   Respiratory Syncytial Virus NOT DETECTED NOT DETECTED Final   Bordetella pertussis NOT DETECTED NOT DETECTED Final   Chlamydophila pneumoniae NOT DETECTED NOT DETECTED Final   Mycoplasma pneumoniae NOT DETECTED NOT DETECTED Final  Culture, blood (routine x 2) Call MD if unable to obtain prior to antibiotics being given     Status: None   Collection Time: 07/14/16  7:56 AM  Result Value Ref Range Status   Specimen Description BLOOD  RIGHT HAND  Final   Special Requests BOTTLES DRAWN AEROBIC AND ANAEROBIC 5CC  Final   Culture NO GROWTH 5 DAYS  Final   Report Status 07/19/2016 FINAL  Final  Culture, blood (routine x 2) Call MD if unable to obtain prior to antibiotics being given     Status: None   Collection Time: 07/14/16  8:00 AM  Result Value Ref Range Status   Specimen Description BLOOD RIGHT ANTECUBITAL  Final   Special Requests IN PEDIATRIC BOTTLE 3CC  Final   Culture NO GROWTH 5 DAYS  Final   Report Status 07/19/2016 FINAL  Final    RADIOLOGY:  Ct Head Wo Contrast  Result Date: 01/31/2018 CLINICAL DATA:  Hypertension with headache. EXAM: CT HEAD WITHOUT CONTRAST TECHNIQUE: Contiguous axial images were obtained from the base of the skull through the vertex without intravenous contrast. COMPARISON:  None. FINDINGS: Brain: No evidence of acute infarction, hemorrhage, hydrocephalus, extra-axial collection or mass lesion/mass effect. Vascular: No hyperdense vessel or unexpected calcification. Skull: Normal. Negative for fracture or focal lesion.  Sinuses/Orbits: No acute finding. Other: None. IMPRESSION: No acute intracranial abnormalities. Electronically Signed   By: Dorise Bullion III M.D   On: 01/31/2018 20:50    Follow up with PCP in 1 week.  Management plans discussed with the patient, family and they are in agreement.  CODE STATUS: Full code    Code Status Orders  (From admission, onward)         Start     Ordered   02/02/18 0006  Full code  Continuous     02/02/18 0005        Code Status History    Date Active Date Inactive Code Status Order ID Comments User Context   07/14/2016 1244 07/15/2016 1913 Full Code [id number]  Samella Parr, NP Inpatient   10/29/2013 2318 11/01/2013 1601 Full Code [id number]  Clarisa Schools, MD Inpatient   10/28/2013 1012 10/29/2013 2318 Full Code [id number]  [name], DO Inpatient      TOTAL TIME TAKING CARE OF THIS PATIENT ON DAY OF DISCHARGE: more than 33 minutes.   Saundra Shelling M.D on 02/02/2018 at 10:43 AM  Between 7am to 6pm - Pager - [phone]  After 6pm go to [email or web address] - password [email or web address] [facility]  Office  [phone]  CC: Primary care physician; Patient, No Pcp Per  Note: This dictation was prepared with Dragon dictation along with smaller phrase technology. Any transcriptional errors that result from this process are unintentional.

## 2018-02-02 NOTE — Progress Notes (Signed)
Pt for discharge home. No resp distress.iv site d/cd. Instructions discussed with pt . presc  Given and discussed.  Diet activity  And f/u discussed. Pt verbalizes understanding of all plans.  Pt to go to med management at discharge to get her meds. Instructed to check b/p  At times   to keep f/u appts

## 2018-02-02 NOTE — Progress Notes (Signed)
Advanced care plan.  Purpose of the Encounter: CODE STATUS  Parties in Attendance:Patient  Patient's Decision Capacity:Good  Subjective/Patient's story: Presented for elevated blood pressure and headcahe   Objective/Medical story Patient has uncontrolled blood pressure Needs opitimization of hypertensive meds   Goals of care determination:  Advance care directives and goals of care discussed Patient wants everything done which includes cpr, intubation and ventilator if need arises   CODE STATUS: Full code   Time spent discussing advanced care planning: 16 minutes

## 2018-02-03 LAB — HIV ANTIBODY (ROUTINE TESTING W REFLEX): HIV SCREEN 4TH GENERATION: NONREACTIVE

## 2018-02-04 ENCOUNTER — Telehealth: Payer: Self-pay

## 2018-02-04 NOTE — Telephone Encounter (Signed)
Left message informing pt who we are and if they would like to become a patient to give [place] a call back to learn more.

## 2018-06-19 ENCOUNTER — Other Ambulatory Visit: Payer: Self-pay

## 2018-06-19 ENCOUNTER — Emergency Department
Admission: EM | Admit: 2018-06-19 | Discharge: 2018-06-19 | Disposition: A | Discharge status: Home / Self Care | Payer: Medicaid Other | Attending: Student in an Organized Health Care Education/Training Program | Admitting: Student in an Organized Health Care Education/Training Program

## 2018-06-19 DIAGNOSIS — Z87891 Personal history of nicotine dependence: Secondary | ICD-10-CM | POA: Insufficient documentation

## 2018-06-19 DIAGNOSIS — Z79899 Other long term (current) drug therapy: Secondary | ICD-10-CM | POA: Insufficient documentation

## 2018-06-19 DIAGNOSIS — I10 Essential (primary) hypertension: Secondary | ICD-10-CM | POA: Insufficient documentation

## 2018-06-19 DIAGNOSIS — Z760 Encounter for issue of repeat prescription: Secondary | ICD-10-CM

## 2018-06-19 MED ORDER — HYDRALAZINE HCL 50 MG PO TABS
50.0000 mg | ORAL_TABLET | Freq: Once | ORAL | Status: AC
  Administered 2018-06-19: 50 mg via ORAL
  Filled 2018-06-19: qty 1

## 2018-06-19 MED ORDER — LABETALOL HCL 100 MG PO TABS
100.0000 mg | ORAL_TABLET | Freq: Once | ORAL | Status: AC
  Administered 2018-06-19: 100 mg via ORAL
  Filled 2018-06-19: qty 1

## 2018-06-19 MED ORDER — HYDRALAZINE HCL 25 MG PO TABS: 25.0000 mg | ORAL_TABLET | Freq: Two times a day (BID) | ORAL | 3 refills | Status: DC

## 2018-06-19 MED ORDER — LABETALOL HCL 200 MG PO TABS: 100.0000 mg | ORAL_TABLET | Freq: Two times a day (BID) | ORAL | 3 refills | Status: DC

## 2018-06-19 NOTE — ED Notes (Signed)
PA-C Chanda Busing. Aware of hypertension at DC.

## 2018-06-19 NOTE — ED Provider Notes (Signed)
[facility] Emergency Department Provider Note  ____________________________________________  Time seen: Approximately 8:10 PM  I have reviewed the triage vital signs and the nursing notes.   HISTORY  Chief Complaint Hypertension    HPI Mary Roberts is a 37 y.o. female who presents the emergency department complaining of increased hypertension after running out of her blood pressure medications.  Patient was seen in this department approximately 3 months ago and diagnosed with  malignant hypertension.  Patient was admitted and discharged with prescriptions for hydralazine and labetalol.  Patient reports that she has been taking her medications until she ran out.  Patient initially did not have health insurance but is recently obtained health insurance and is in the process of securing a primary care physician.  Patient's last dose of labetalol was yesterday.  Patient is completely asymptomatic at this time with no headache, visual changes, chest pain, shortness of breath, nausea or vomiting.  Patient's blood pressure is elevated, and she states that this is typical if she forgets a dose of medication or in this case running out of medication.  Patient is here for medication refill only.   Past Medical History:  Diagnosis Date  . Anemia   . Anxiety    panic attacks  . Carpal tunnel syndrome, bilateral   . Chlamydia   . Headache(784.0)    migraines  . History of C-section   . Hypertension   . Infection    UTI  . Pregnancy induced hypertension    with first  . Seizures ([place])    No meds, off since in mid to late 20's    Patient Active Problem List   Diagnosis Date Noted  . Uncontrolled hypertension 02/01/2018  . Anemia due to chronic blood loss 07/14/2016  . Bilateral pneumonia 07/14/2016  . HTN (hypertension) 07/14/2016  . Acute hypokalemia 07/14/2016  . Heavy menstrual bleeding 07/14/2016  . Intertrochanteric fracture of left femur ([place])  07/14/2016  . Carpal tunnel syndrome, bilateral   . Pre-eclampsia, delivered 10/29/2013  . History of seizures/last episode >15 yrs ago 10/28/2013  . History of Severe pre-eclampsia 10/28/2013  . Previous cesarean delivery, antepartum condition or complication 74/01/1447  . Severe Preeclampsia superimposed on pre-existing hypertension, antepartum 04/29/2013  . Supervision of high risk pregnancy in second trimester 04/29/2013    Past Surgical History:  Procedure Laterality Date  . CESAREAN SECTION    . MOUTH SURGERY    . TUBAL LIGATION N/A 10/30/2013   Procedure: POST PARTUM TUBAL LIGATION;  Surgeon: [name], MD;  Location: [facility];  Service: Gynecology;  Laterality: N/A;    Prior to Admission medications   Medication Sig Start Date End Date Taking? Authorizing Provider  aspirin-acetaminophen-caffeine (EXCEDRIN MIGRAINE) [phone] MG tablet Take 1 tablet by mouth every 6 (six) hours as needed for headache.    [provider]  hydrALAZINE (APRESOLINE) 25 MG tablet Take 1 tablet (25 mg total) by mouth 2 (two) times daily. 06/19/18 10/17/18  Remmie Bembenek, Charline Bills, PA-C  labetalol (NORMODYNE) 200 MG tablet Take 0.5 tablets (100 mg total) by mouth 2 (two) times daily. 06/19/18 10/17/18  Jaysin Gayler, Charline Bills, PA-C    Allergies Amoxicillin  Family History  Problem Relation Age of Onset  . Seizures Mother   . Hypertension Mother   . Diabetes Mother   . Heart disease Mother   . Stroke Mother   . Seizures Brother   . Hearing loss Neg Hx     Social History Social History  Tobacco Use  . Smoking status: Former Research scientist (life sciences)  . Smokeless tobacco: Never Used  Substance Use Topics  . Alcohol use: No    Comment: socially   . Drug use: No     Review of Systems  Constitutional: No fever/chills Eyes: No visual changes.  Cardiovascular: no chest pain. Respiratory: no cough. No SOB. Gastrointestinal: No abdominal pain.  No nausea, no vomiting.  Musculoskeletal: Negative for  musculoskeletal pain. Skin: Negative for rash, abrasions, lacerations, ecchymosis. Neurological: Negative for headaches, focal weakness or numbness. 10-point ROS otherwise negative.  ____________________________________________   PHYSICAL EXAM:  VITAL SIGNS: ED Triage Vitals [06/19/18 2000]  Enc Vitals Group     BP (!) 197/116     Pulse Rate 86     Resp 20     Temp 98.2 F (36.8 C)     Temp Source Oral     SpO2 100 %     Weight 180 lb (81.6 kg)     Height 5\' 2"  (1.575 m)     Head Circumference      Peak Flow      Pain Score 0     Pain Loc      Pain Edu?      Excl. in [place]?      Constitutional: Alert and oriented. Well appearing and in no acute distress. Eyes: Conjunctivae are normal. PERRL. EOMI. funduscopic exam reveals red reflex bilaterally.  Vasculature and optic disc is unremarkable.  No papilledema. Head: Atraumatic. ENT:      Ears:       Nose: No congestion/rhinnorhea.      Mouth/Throat: Mucous membranes are moist.  Neck: No stridor.    Cardiovascular: Normal rate, regular rhythm. Normal S1 and S2.  Good peripheral circulation. Respiratory: Normal respiratory effort without tachypnea or retractions. Lungs CTAB. Good air entry to the bases with no decreased or absent breath sounds. Musculoskeletal: Full range of motion to all extremities. No gross deformities appreciated. Neurologic:  Normal speech and language. No gross focal neurologic deficits are appreciated.  Cranial nerves II through XII grossly intact.  Negative Romberg's and pronator drift. Skin:  Skin is warm, dry and intact. No rash noted. Psychiatric: Mood and affect are normal. Speech and behavior are normal. Patient exhibits appropriate insight and judgement.   ____________________________________________   LABS (all labs ordered are listed, but only abnormal results are displayed)  Labs Reviewed - No data to  display ____________________________________________  EKG   ____________________________________________  RADIOLOGY   No results found.  ____________________________________________    PROCEDURES  Procedure(s) performed:    Procedures    Medications  labetalol (NORMODYNE) tablet 100 mg (has no administration in time range)  hydrALAZINE (APRESOLINE) tablet 50 mg (has no administration in time range)     ____________________________________________   INITIAL IMPRESSION / ASSESSMENT AND PLAN / ED COURSE  Pertinent labs & imaging results that were available during my care of the patient were reviewed by me and considered in my medical decision making (see chart for details).  Review of the [place] CSRS was performed in accordance of the [place] prior to dispensing any controlled drugs.      Patient's diagnosis is consistent with essential hypertension, and medication refill.  Patient presents emergency department after running out of her hypertensive medications.  Patient reports that she has been on labetalol and hydralazine.  Patient's blood pressure is elevated tonight.  She denies any symptoms.  Exam is reassuring.  At this time, we will give patient a dose  of both of her medications, refill same and discharge the patient.  No indication for labs or imaging at this time.  Patient is in the process of establishing with primary care who will manage her hypertension and medication refills in the future.. Patient is given ED precautions to return to the ED for any worsening or new symptoms.     ____________________________________________  FINAL CLINICAL IMPRESSION(S) / ED DIAGNOSES  Final diagnoses:  Essential hypertension  Medication refill      NEW MEDICATIONS STARTED DURING THIS VISIT:  ED Discharge Orders         Ordered    hydrALAZINE (APRESOLINE) 25 MG tablet  2 times daily     06/19/18 2018    labetalol (NORMODYNE) 200 MG tablet  2 times daily      06/19/18 2018              This chart was dictated using voice recognition software/Dragon. Despite best efforts to proofread, errors can occur which can change the meaning. Any change was purely unintentional.    Darletta Moll, PA-C 06/19/18 2023    Merlyn Lot, MD 06/19/18 2322

## 2018-06-19 NOTE — ED Triage Notes (Signed)
Pt here for htn states was here for the same a few months ago and we put her on bp meds. States ran out of labetalol yesterday, pt here for refill. Pt has no complaints states just needs refill.

## 2018-07-08 ENCOUNTER — Other Ambulatory Visit: Payer: Self-pay

## 2018-07-08 ENCOUNTER — Emergency Department
Admission: EM | Admit: 2018-07-08 | Discharge: 2018-07-08 | Disposition: A | Discharge status: Home / Self Care | Payer: BLUE CROSS/BLUE SHIELD | Attending: Emergency Medicine | Admitting: Emergency Medicine

## 2018-07-08 ENCOUNTER — Encounter: Payer: Self-pay | Admitting: Emergency Medicine

## 2018-07-08 DIAGNOSIS — I10 Essential (primary) hypertension: Secondary | ICD-10-CM | POA: Diagnosis not present

## 2018-07-08 DIAGNOSIS — Z87891 Personal history of nicotine dependence: Secondary | ICD-10-CM | POA: Insufficient documentation

## 2018-07-08 DIAGNOSIS — D500 Iron deficiency anemia secondary to blood loss (chronic): Secondary | ICD-10-CM

## 2018-07-08 DIAGNOSIS — R42 Dizziness and giddiness: Secondary | ICD-10-CM | POA: Diagnosis not present

## 2018-07-08 LAB — CBC WITH DIFFERENTIAL/PLATELET
Abs Immature Granulocytes: 0.03 10*3/uL (ref 0.00–0.07)
Basophils Absolute: 0 10*3/uL (ref 0.0–0.1)
Basophils Relative: 0 %
EOS ABS: 0.2 10*3/uL (ref 0.0–0.5)
EOS PCT: 2 %
HCT: 24.4 % — ABNORMAL LOW (ref 36.0–46.0)
Hemoglobin: 6.9 g/dL — ABNORMAL LOW (ref 12.0–15.0)
Immature Granulocytes: 0 %
Lymphocytes Relative: 13 %
Lymphs Abs: 0.9 10*3/uL (ref 0.7–4.0)
MCH: 21 pg — AB (ref 26.0–34.0)
MCHC: 28.3 g/dL — ABNORMAL LOW (ref 30.0–36.0)
MCV: 74.4 fL — ABNORMAL LOW (ref 80.0–100.0)
MONO ABS: 0.4 10*3/uL (ref 0.1–1.0)
Monocytes Relative: 6 %
Neutro Abs: 5.4 10*3/uL (ref 1.7–7.7)
Neutrophils Relative %: 79 %
Platelets: 356 10*3/uL (ref 150–400)
RBC: 3.28 MIL/uL — ABNORMAL LOW (ref 3.87–5.11)
RDW: 17.8 % — ABNORMAL HIGH (ref 11.5–15.5)
WBC: 6.9 10*3/uL (ref 4.0–10.5)
nRBC: 0 % (ref 0.0–0.2)

## 2018-07-08 LAB — COMPREHENSIVE METABOLIC PANEL
ALT: 11 U/L (ref 0–44)
AST: 23 U/L (ref 15–41)
Albumin: 4.1 g/dL (ref 3.5–5.0)
Alkaline Phosphatase: 40 U/L (ref 38–126)
Anion gap: 7 (ref 5–15)
BUN: 14 mg/dL (ref 6–20)
CO2: 25 mmol/L (ref 22–32)
Calcium: 8.9 mg/dL (ref 8.9–10.3)
Chloride: 105 mmol/L (ref 98–111)
Creatinine, Ser: 0.83 mg/dL (ref 0.44–1.00)
GFR calc non Af Amer: >60 mL/min (ref >60)
Glucose, Bld: 79 mg/dL (ref 70–99)
Potassium: 3.4 mmol/L — ABNORMAL LOW (ref 3.5–5.1)
Sodium: 137 mmol/L (ref 135–145)
Total Bilirubin: 0.6 mg/dL (ref 0.3–1.2)
Total Protein: 8.1 g/dL (ref 6.5–8.1)

## 2018-07-08 LAB — I-STAT BETA HCG BLOOD, ED (NOT ORDERABLE): I-stat hCG, quantitative: <5 m[IU]/mL (ref <5)

## 2018-07-08 LAB — TROPONIN I: Troponin I: <0.03 ng/mL (ref <0.03)

## 2018-07-08 LAB — ABO/RH: ABO/RH(D): O POS

## 2018-07-08 LAB — PREPARE RBC (CROSSMATCH)

## 2018-07-08 MED ORDER — SODIUM CHLORIDE 0.9 % IV SOLN
10.0000 mL/h | Freq: Once | INTRAVENOUS | Status: AC
  Administered 2018-07-08: 10 mL/h via INTRAVENOUS

## 2018-07-08 MED ORDER — MEDROXYPROGESTERONE ACETATE 10 MG PO TABS: 10.0000 mg | ORAL_TABLET | Freq: Every day | ORAL | 0 refills | Status: DC

## 2018-07-08 NOTE — ED Notes (Signed)
Topaz pad not working, gives message "Unable to initialize session with [employer]. Error in HyperspaceWeb configuration.".  Pt verbalized understanding of all discharge instruction including FU with OB and PCP for BP and bleeding.

## 2018-07-08 NOTE — ED Provider Notes (Addendum)
[facility] Emergency Department Provider Note       Time seen: ----------------------------------------- 8:41 AM on 07/08/2018 -----------------------------------------   I have reviewed the triage vital signs and the nursing notes.  HISTORY   Chief Complaint Dizziness    HPI Mary Roberts is a 38 y.o. female with a history of anemia, anxiety, hypertension, seizure who presents to the ED for dizziness.  Patient states dizziness started this morning.  She does report heavy menstrual cycles but states she stopped bleeding on Monday.  Patient states she has recurrent problems where she needed blood transfusions due to blood loss from her menstrual cycles.  She typically has significantly elevated blood pressure, is only taking the labetalol 200 mg twice a day.  She has had difficulty tolerating the antihypertensives in the past.  Past Medical History:  Diagnosis Date  . Anemia   . Anxiety    panic attacks  . Carpal tunnel syndrome, bilateral   . Chlamydia   . Headache(784.0)    migraines  . History of C-section   . Hypertension   . Infection    UTI  . Pregnancy induced hypertension    with first  . Seizures ([place])    No meds, off since in mid to late 20's    Patient Active Problem List   Diagnosis Date Noted  . Uncontrolled hypertension 02/01/2018  . Anemia due to chronic blood loss 07/14/2016  . Bilateral pneumonia 07/14/2016  . HTN (hypertension) 07/14/2016  . Acute hypokalemia 07/14/2016  . Heavy menstrual bleeding 07/14/2016  . Intertrochanteric fracture of left femur ([place]) 07/14/2016  . Carpal tunnel syndrome, bilateral   . Pre-eclampsia, delivered 10/29/2013  . History of seizures/last episode >15 yrs ago 10/28/2013  . History of Severe pre-eclampsia 10/28/2013  . Previous cesarean delivery, antepartum condition or complication 81/44/8185  . Severe Preeclampsia superimposed on pre-existing hypertension, antepartum 04/29/2013  .  Supervision of high risk pregnancy in second trimester 04/29/2013    Past Surgical History:  Procedure Laterality Date  . CESAREAN SECTION    . MOUTH SURGERY    . TUBAL LIGATION N/A 10/30/2013   Procedure: POST PARTUM TUBAL LIGATION;  Surgeon: [name], MD;  Location: [facility];  Service: Gynecology;  Laterality: N/A;    Allergies Amoxicillin  Social History Social History   Tobacco Use  . Smoking status: Former Research scientist (life sciences)  . Smokeless tobacco: Never Used  Substance Use Topics  . Alcohol use: No    Comment: socially   . Drug use: No   Review of Systems Constitutional: Negative for fever. Cardiovascular: Negative for chest pain. Respiratory: Negative for shortness of breath. Gastrointestinal: Negative for abdominal pain, vomiting and diarrhea. Genitourinary: Positive for recent heavy menstrual bleeding Musculoskeletal: Negative for back pain. Skin: Negative for rash. Neurological: Negative for headaches, positive for dizziness  All systems negative/normal/unremarkable except as stated in the HPI  ____________________________________________   PHYSICAL EXAM:  VITAL SIGNS: ED Triage Vitals  Enc Vitals Group     BP 07/08/18 0829 (!) 190/115     Pulse Rate 07/08/18 0829 80     Resp 07/08/18 0829 15     Temp 07/08/18 0829 98.4 F (36.9 C)     Temp Source 07/08/18 0829 Oral     SpO2 07/08/18 0829 100 %     Weight 07/08/18 0830 184 lb (83.5 kg)     Height 07/08/18 0830 5\' 2"  (1.575 m)     Head Circumference --      Peak  Flow --      Pain Score 07/08/18 0836 0     Pain Loc --      Pain Edu? --      Excl. in [place]? --    Constitutional: Alert and oriented. Well appearing and in no distress. Eyes: Conjunctivae are normal. Normal extraocular movements. Cardiovascular: Normal rate, regular rhythm. No murmurs, rubs, or gallops. Respiratory: Normal respiratory effort without tachypnea nor retractions. Breath sounds are clear and equal bilaterally. No  wheezes/rales/rhonchi. Gastrointestinal: Soft and nontender. Normal bowel sounds Musculoskeletal: Nontender with normal range of motion in extremities. No lower extremity tenderness nor edema. Neurologic:  Normal speech and language. No gross focal neurologic deficits are appreciated.  Skin:  Skin is warm, dry and intact. No rash noted. Psychiatric: Mood and affect are normal. Speech and behavior are normal.  ____________________________________________  EKG: Interpreted by me.  Sinus rhythm with a rate of 73 bpm, borderline right axis deviation, normal QT, normal QRS  ____________________________________________  ED COURSE:  As part of my medical decision making, I reviewed the following data within the [place] History obtained from family if available, nursing notes, old chart and ekg, as well as notes from prior ED visits. Patient presented for dizziness, we will assess with labs and reevaluate. Clinical Course as of Jul 08 1216  Wed Jul 08, 2018  0917 Will order blood for transfusion. Also concern for unmanaged HTN  Hemoglobin(!): 6.9 [JW]  0926 Patient has consented to blood transfusion   [JW]    Clinical Course User Index [JW] Earleen Newport, MD   Procedures ____________________________________________   LABS (pertinent positives/negatives)  Labs Reviewed  CBC WITH DIFFERENTIAL/PLATELET - Abnormal; Notable for the following components:      Result Value   RBC 3.28 (*)    Hemoglobin 6.9 (*)    HCT 24.4 (*)    MCV 74.4 (*)    MCH 21.0 (*)    MCHC 28.3 (*)    RDW 17.8 (*)    All other components within normal limits  COMPREHENSIVE METABOLIC PANEL - Abnormal; Notable for the following components:   Potassium 3.4 (*)    All other components within normal limits  TROPONIN I  I-STAT BETA HCG BLOOD, ED ([facility], [facility], [facility] ONLY)  I-STAT BETA HCG BLOOD, ED (NOT ORDERABLE)  TYPE AND SCREEN  PREPARE RBC (CROSSMATCH)  ABO/RH   CRITICAL CARE Performed  by: Laurence Aly   Total critical care time: 30 minutes  Critical care time was exclusive of separately billable procedures and treating other patients.  Critical care was necessary to treat or prevent imminent or life-threatening deterioration.  Critical care was time spent personally by me on the following activities: development of treatment plan with patient and/or surrogate as well as nursing, discussions with consultants, evaluation of patient's response to treatment, examination of patient, obtaining history from patient or surrogate, ordering and performing treatments and interventions, ordering and review of laboratory studies, ordering and review of radiographic studies, pulse oximetry and re-evaluation of patient's condition.  ____________________________________________   DIFFERENTIAL DIAGNOSIS   Dehydration, anemia, electrolyte abnormality, iron deficiency, occult infection, anxiety, peripheral vertigo  FINAL ASSESSMENT AND PLAN  Dizziness, anemia secondary to chronic blood loss, recent menorrhagia   Plan: The patient had presented for dizziness. Patient's labs did reveal significant anemia with a hemoglobin of 6.9, otherwise her tests were normal.  Patient received blood transfusion of 1 unit of typed and crossed blood.  She tolerated this well and had  no adverse reactions.  I did discuss with GYN on-call.  We will write for Provera for her to take if her next menstrual cycle involves heavy bleeding.  Should be referred to GYN for close outpatient follow-up.   Laurence Aly, MD    Note: This note was generated in part or whole with voice recognition software. Voice recognition is usually quite accurate but there are transcription errors that can and very often do occur. I apologize for any typographical errors that were not detected and corrected.     Earleen Newport, MD 07/08/18 1219    Earleen Newport, MD 07/21/18 0800

## 2018-07-08 NOTE — ED Notes (Signed)
MD aware of bp, ok for discharge

## 2018-07-08 NOTE — ED Triage Notes (Signed)
Pt arrives with concerns of dizziness. Pt states the dizziness started this morning. Pt a& o x4 with no neuro deficits. Pt reports heavy periods but states she stopped Monday. Pt states she has recurrent problems where she needed blood transfusion due to blood loss during her cycle.

## 2018-07-09 LAB — TYPE AND SCREEN
ABO/RH(D): O POS
Antibody Screen: NEGATIVE
Unit division: 0

## 2018-07-09 LAB — BPAM RBC
Blood Product Expiration Date: 202001302359
ISSUE DATE / TIME: 202001151001
Unit Type and Rh: 5100

## 2018-07-13 ENCOUNTER — Encounter: Payer: BLUE CROSS/BLUE SHIELD | Admitting: Obstetrics and Gynecology

## 2018-07-15 ENCOUNTER — Encounter: Payer: Self-pay | Admitting: Obstetrics and Gynecology

## 2018-07-15 ENCOUNTER — Ambulatory Visit (INDEPENDENT_AMBULATORY_CARE_PROVIDER_SITE_OTHER): Payer: BLUE CROSS/BLUE SHIELD | Admitting: Obstetrics and Gynecology

## 2018-07-15 ENCOUNTER — Other Ambulatory Visit (HOSPITAL_COMMUNITY)
Admission: RE | Admit: 2018-07-15 | Discharge: 2018-07-15 | Disposition: A | Discharge status: Home / Self Care | Payer: BLUE CROSS/BLUE SHIELD | Source: Ambulatory Visit | Attending: Obstetrics and Gynecology | Admitting: Obstetrics and Gynecology

## 2018-07-15 VITALS — BP 144/94 | Ht 62.0 in | Wt 184.0 lb

## 2018-07-15 DIAGNOSIS — D62 Acute posthemorrhagic anemia: Secondary | ICD-10-CM | POA: Diagnosis not present

## 2018-07-15 DIAGNOSIS — N920 Excessive and frequent menstruation with regular cycle: Secondary | ICD-10-CM | POA: Diagnosis not present

## 2018-07-15 MED ORDER — NORETHINDRONE ACETATE 5 MG PO TABS: 5.0000 mg | ORAL_TABLET | Freq: Every day | ORAL | 11 refills | Status: DC

## 2018-07-15 NOTE — Progress Notes (Signed)
Obstetrics & Gynecology Office Visit   Chief Complaint  Patient presents with  . Follow-up  ER visit for acute blood loss anemia due to heavy menstrual bleeding  History of Present Illness: 38 y.o. Mary Roberts female who presents in follow up from an ER visit on 07/08/2018 for feeling dizzy due to the fact that she had recently had a heavy menses.  She was found to have a hemoglobin of 6.9. She received 1 unit of pRBCs at the hospital.  Her menses are monthly, 4 weeks apart.  They last about 4 days.  Mostly the heavy bleeding lasts 2 days with passage of clots.  She states that this is the second time she has had to have a blood transfusion.  The first time was 06/2016.  Her menses have always been "kind of heavy." However, she states that the heaviness became worse after the birth of her youngest child in 28.  Her most recent pap smear was a couple of years ago, it was normal per her report.  She states that she had an abnormal pap back in 2004, but her more recent pap smears have been normal.  She was on a birth control a couple years ago. This stopped her bleeding, but was stopped due to her blood pressure.  Her last pelvic ultrasound was 06/2016 and appeared normal.    Past Medical History:  Diagnosis Date  . Anemia   . Anxiety    panic attacks  . Carpal tunnel syndrome, bilateral   . Chlamydia   . Headache(784.0)    migraines  . History of C-section   . Hypertension   . Infection    UTI  . Pregnancy induced hypertension    with first  . Seizures ([place])    No meds, off since in mid to late 20's    Past Surgical History:  Procedure Laterality Date  . CESAREAN SECTION    . MOUTH SURGERY    . TUBAL LIGATION N/A 10/30/2013   Procedure: POST PARTUM TUBAL LIGATION;  Surgeon: [name], MD;  Location: [facility];  Service: Gynecology;  Laterality: N/A;    Gynecologic History: Patient's last menstrual period was 07/06/2018.  Obstetric History: [id number], s/p c-section x 1  Family  History  Problem Relation Age of Onset  . Seizures Mother   . Hypertension Mother   . Diabetes Mother   . Heart disease Mother   . Stroke Mother   . Seizures Brother   . Hearing loss Neg Hx     Social History   Socioeconomic History  . Marital status: Single    Spouse name: Not on file  . Number of children: Not on file  . Years of education: Not on file  . Highest education level: Not on file  Occupational History  . Not on file  Social Needs  . Financial resource strain: Not on file  . Food insecurity:    Worry: Not on file    Inability: Not on file  . Transportation needs:    Medical: Not on file    Non-medical: Not on file  Tobacco Use  . Smoking status: Former Research scientist (life sciences)  . Smokeless tobacco: Never Used  Substance and Sexual Activity  . Alcohol use: No    Comment: socially   . Drug use: No  . Sexual activity: Yes    Birth control/protection: None  Lifestyle  . Physical activity:    Days per week: Not on file    Minutes per session: Not  on file  . Stress: Not on file  Relationships  . Social connections:    Talks on phone: Not on file    Gets together: Not on file    Attends religious service: Not on file    Active member of club or organization: Not on file    Attends meetings of clubs or organizations: Not on file    Relationship status: Not on file  . Intimate partner violence:    Fear of current or ex partner: Not on file    Emotionally abused: Not on file    Physically abused: Not on file    Forced sexual activity: Not on file  Other Topics Concern  . Not on file  Social History Narrative  . Not on file    Allergies  Allergen Reactions  . Amoxicillin Hives and Other (See Comments)    Has patient had a PCN reaction causing immediate rash, facial/tongue/throat swelling, SOB or lightheadedness with hypotension: No Has patient had a PCN reaction causing severe rash involving mucus membranes or skin necrosis: No Has patient had a PCN reaction that  required hospitalization: No Has patient had a PCN reaction occurring within the last 10 years: Yes If all of the above answers are "NO", then may proceed with Cephalosporin use.     Prior to Admission medications   Medication Sig Start Date End Date Taking? Authorizing Provider  aspirin-acetaminophen-caffeine (EXCEDRIN MIGRAINE) [phone] MG tablet Take 1 tablet by mouth every 6 (six) hours as needed for headache.   Yes [provider]  hydrALAZINE (APRESOLINE) 25 MG tablet Take 1 tablet (25 mg total) by mouth 2 (two) times daily. 06/19/18 10/17/18 Yes Cuthriell, Charline Bills, PA-C  labetalol (NORMODYNE) 200 MG tablet Take 0.5 tablets (100 mg total) by mouth 2 (two) times daily. 06/19/18 10/17/18 Yes Cuthriell, Charline Bills, PA-C    Review of Systems  Constitutional: Negative.   HENT: Negative.   Eyes: Negative.   Respiratory: Negative.   Cardiovascular: Negative.   Gastrointestinal: Negative.   Genitourinary: Negative.   Musculoskeletal: Negative.   Skin: Negative.   Neurological: Negative.   Psychiatric/Behavioral: Negative.      Physical Exam BP (!) 144/94   Ht 5\' 2"  (1.575 m)   Wt 184 lb (83.5 kg)   LMP 07/06/2018   BMI 33.65 kg/m  Patient's last menstrual period was 07/06/2018. Physical Exam Constitutional:      General: She is not in acute distress.    Appearance: Normal appearance. She is well-developed.  Genitourinary:     Pelvic exam was performed with patient supine.     Vulva, inguinal canal, urethra, bladder, vagina, uterus, right adnexa and left adnexa normal.     No cervical discharge, friability, lesion or polyp.  HENT:     Head: Normocephalic and atraumatic.  Eyes:     General: No scleral icterus.    Conjunctiva/sclera: Conjunctivae normal.  Neck:     Musculoskeletal: Normal range of motion and neck supple.  Cardiovascular:     Rate and Rhythm: Normal rate and regular rhythm.  Pulmonary:     Effort: Pulmonary effort is normal. No respiratory  distress.     Breath sounds: Normal breath sounds. No wheezing or rales.  Abdominal:     General: Bowel sounds are normal. There is no distension.     Palpations: Abdomen is soft. There is no mass.     Tenderness: There is no abdominal tenderness. There is no guarding or rebound.  Musculoskeletal: Normal range of  motion.  Neurological:     General: No focal deficit present.     Mental Status: She is alert and oriented to person, place, and time.     Cranial Nerves: No cranial nerve deficit.  Skin:    General: Skin is warm and dry.     Findings: No erythema.  Psychiatric:        Mood and Affect: Mood normal.        Behavior: Behavior normal.        Judgment: Judgment normal.     Female chaperone present for pelvic and breast  portions of the physical exam  Assessment: 38 y.o. [id number] female here for  1. Menorrhagia with regular cycle   2. Anemia associated with acute blood loss      Plan: Problem List Items Addressed This Visit      Other   Menorrhagia with regular cycle - Primary   Relevant Medications   norethindrone (AYGESTIN) 5 MG tablet   Other Relevant Orders   US PELVIS TRANSVANGINAL NON-OB (TV ONLY)   Cytology - PAP   Anemia associated with acute blood loss   Relevant Medications   norethindrone (AYGESTIN) 5 MG tablet   Other Relevant Orders   US PELVIS TRANSVANGINAL NON-OB (TV ONLY)   Cytology - PAP     Discussed management options for abnormal uterine bleeding including expectant, NSAIDs, tranexamic acid (Lysteda), oral progesterone (Provera, norethindrone, megace), Depo Provera, Levonorgestrel containing IUD, endometrial ablation (Novasure) or hysterectomy as definitive surgical management.  Discussed risks and benefits of each method.   Final management decision will hinge on results of patient's work up and whether an underlying etiology for the patients bleeding symptoms can be discerned.  We will conduct a basic work up examining using the PALM-COIEN  classification system.  In the meantime the patient opts to trial of norethindrone while we await results of her ultrasound and labs.  Printed patient education handouts were given to the patient to review at home.  Bleeding precautions reviewed. Pap smear obtained today.  Will strongly consider a hysterectomy, per her request, if all workup normal. Consider endometrial biopsy pending ultrasound results. Also, encouraged her to continue iron sulfate to increase her blood counts in the mean time. Discussed that norethindrone may cause abnormal bleeding for a short while, but should be much lighter than the bleeding she has experienced.   Prentice Docker, MD 07/15/2018 10:09 AM

## 2018-07-16 ENCOUNTER — Encounter: Payer: Self-pay | Admitting: Obstetrics and Gynecology

## 2018-07-20 LAB — CYTOLOGY - PAP
Adequacy: ABSENT
CHLAMYDIA, DNA PROBE: NEGATIVE
Diagnosis: NEGATIVE
HPV: NOT DETECTED
Neisseria Gonorrhea: NEGATIVE

## 2018-07-23 ENCOUNTER — Encounter: Payer: Self-pay | Admitting: Obstetrics and Gynecology

## 2018-07-29 ENCOUNTER — Ambulatory Visit (INDEPENDENT_AMBULATORY_CARE_PROVIDER_SITE_OTHER): Payer: BLUE CROSS/BLUE SHIELD | Admitting: Obstetrics and Gynecology

## 2018-07-29 ENCOUNTER — Encounter: Payer: Self-pay | Admitting: Obstetrics and Gynecology

## 2018-07-29 ENCOUNTER — Ambulatory Visit (INDEPENDENT_AMBULATORY_CARE_PROVIDER_SITE_OTHER): Payer: BLUE CROSS/BLUE SHIELD

## 2018-07-29 VITALS — BP 124/78 | Ht 62.0 in | Wt 182.0 lb

## 2018-07-29 DIAGNOSIS — D62 Acute posthemorrhagic anemia: Secondary | ICD-10-CM

## 2018-07-29 DIAGNOSIS — N920 Excessive and frequent menstruation with regular cycle: Secondary | ICD-10-CM | POA: Diagnosis not present

## 2018-07-29 NOTE — Progress Notes (Signed)
Gynecology Ultrasound Follow Up   Chief Complaint  Patient presents with  . Follow-up  Menorrhagia with resultant anemia   History of Present Illness: Patient is a 38 y.o. female who presents today for ultrasound evaluation of the above .  Ultrasound demonstrates the following findings Adnexa: no masses seen  Uterus: anteverted with endometrial stripe is indistinct Additional: bulky fundus with findings suspicious for adenomyosis.  One fibroid, 1.6 cm in greatest dimension.  Past Medical History:  Diagnosis Date  . Anemia   . Anxiety    panic attacks  . Carpal tunnel syndrome, bilateral   . Chlamydia   . Headache(784.0)    migraines  . History of C-section   . Hypertension   . Infection    UTI  . Pregnancy induced hypertension    with first  . Seizures ([place])    No meds, off since in mid to late 20's    Past Surgical History:  Procedure Laterality Date  . CESAREAN SECTION    . MOUTH SURGERY    . TUBAL LIGATION N/A 10/30/2013   Procedure: POST PARTUM TUBAL LIGATION;  Surgeon: [name], MD;  Location: [facility];  Service: Gynecology;  Laterality: N/A;    Family History  Problem Relation Age of Onset  . Seizures Mother   . Hypertension Mother   . Diabetes Mother   . Heart disease Mother   . Stroke Mother   . Seizures Brother   . Hearing loss Neg Hx     Social History   Socioeconomic History  . Marital status: Single    Spouse name: Not on file  . Number of children: Not on file  . Years of education: Not on file  . Highest education level: Not on file  Occupational History  . Not on file  Social Needs  . Financial resource strain: Not on file  . Food insecurity:    Worry: Not on file    Inability: Not on file  . Transportation needs:    Medical: Not on file    Non-medical: Not on file  Tobacco Use  . Smoking status: Former Research scientist (life sciences)  . Smokeless tobacco: Never Used  Substance and Sexual Activity  . Alcohol use: No    Comment: socially   .  Drug use: No  . Sexual activity: Yes    Birth control/protection: None  Lifestyle  . Physical activity:    Days per week: Not on file    Minutes per session: Not on file  . Stress: Not on file  Relationships  . Social connections:    Talks on phone: Not on file    Gets together: Not on file    Attends religious service: Not on file    Active member of club or organization: Not on file    Attends meetings of clubs or organizations: Not on file    Relationship status: Not on file  . Intimate partner violence:    Fear of current or ex partner: Not on file    Emotionally abused: Not on file    Physically abused: Not on file    Forced sexual activity: Not on file  Other Topics Concern  . Not on file  Social History Narrative  . Not on file    Allergies  Allergen Reactions  . Amoxicillin Hives and Other (See Comments)    Has patient had a PCN reaction causing immediate rash, facial/tongue/throat swelling, SOB or lightheadedness with hypotension: No Has patient had a PCN  reaction causing severe rash involving mucus membranes or skin necrosis: No Has patient had a PCN reaction that required hospitalization: No Has patient had a PCN reaction occurring within the last 10 years: Yes If all of the above answers are "NO", then may proceed with Cephalosporin use.     Prior to Admission medications   Medication Sig Start Date End Date Taking? Authorizing Provider  aspirin-acetaminophen-caffeine (EXCEDRIN MIGRAINE) [phone] MG tablet Take 1 tablet by mouth every 6 (six) hours as needed for headache.    [provider]  hydrALAZINE (APRESOLINE) 25 MG tablet Take 1 tablet (25 mg total) by mouth 2 (two) times daily. 06/19/18 10/17/18  Cuthriell, Charline Bills, PA-C  labetalol (NORMODYNE) 200 MG tablet Take 0.5 tablets (100 mg total) by mouth 2 (two) times daily. 06/19/18 10/17/18  Cuthriell, Charline Bills, PA-C  medroxyPROGESTERone (PROVERA) 10 MG tablet Take 1 tablet (10 mg total) by  mouth daily. Patient not taking: Reported on 07/15/2018 07/08/18   Earleen Newport, MD  norethindrone (AYGESTIN) 5 MG tablet Take 1 tablet (5 mg total) by mouth daily. 07/15/18   Will Bonnet, MD    Physical Exam BP 124/78   Ht 5\' 2"  (1.575 m)   Wt 182 lb (82.6 kg)   LMP 07/06/2018   BMI 33.29 kg/m    General: NAD HEENT: normocephalic, anicteric Pulmonary: No increased work of breathing Extremities: no edema, erythema, or tenderness Neurologic: Grossly intact, normal gait Psychiatric: mood appropriate, affect full  Imaging Results US Pelvis Transvanginal Non-ob (tv Only)  Result Date: 07/29/2018 Patient Name: Mary Roberts DOB: 1980/10/22 MRN: [id number] ULTRASOUND REPORT Location: [facility] Date of Service: 07/29/2018 Indications: Menorrhagia with acute blood loss. Findings: The uterus is anteverted and measures 10.9 x 5.9 x 5.8cm. Bulky fundal uterus. Echo texture is heterogenous with evidence of focal mass. Within the uterus is one suspected fibroid measuring: Fibroid 1:  1.6 x 1.2 x 0.9 cm (Right, Intramural) The Endometrium is indistinct. Questionable adenomyosis. Right Ovary measures 1.9 x 1.8 x 1.8cm. It is normal in appearance. Left Ovary measures 2.3 x 1.6 x 1.7cm. It is normal in appearance. Survey of the adnexa demonstrates no adnexal masses. There is no free fluid in the cul de sac. Impression: 1. Heterogeneous, bulky uterus with indistinct endometrial lining. Questionable adenomyosis. 2. Single intramural fibroid. Vita Barley, RDMS RVT The ultrasound images and findings were reviewed by me and I agree with the above report. Prentice Docker, MD, [facility], [facility] 07/29/2018 10:59 AM       Assessment: 38 y.o. [id number]  1. Menorrhagia with regular cycle   2. Anemia associated with acute blood loss      Plan: Problem List Items Addressed This Visit      Other   Menorrhagia with regular cycle - Primary   Anemia associated with  acute blood loss     Discussed ultrasound findings.  Some indications of adenomyosis. Discussed effective treatment modalities of adenomyosis.  I also recommended endometrial biopsy. However, she declines today.  She would like to try medication for a while prior to trying anything else (e.g. surgery or IUD placement, etc).  20 minutes spent in face to face discussion with > 50% spent in counseling,management, and coordination of care of her menorrhagia with regular cycle and anemia associated with acute blood loss.    Prentice Docker, MD, [facility], [facility] 07/29/2018 1:07 PM

## 2018-09-02 ENCOUNTER — Telehealth: Payer: Self-pay

## 2018-09-02 NOTE — Telephone Encounter (Signed)
Copied from [place] [id number]. Topic: Appointment Scheduling - New Patient >> Sep 02, 2018  1:35 PM Bea Graff, NT wrote: New patient has been scheduled for your office. Provider: Philis Nettle  Date of Appointment: 09/10/2018  Route to department's PEC pool.

## 2018-09-03 ENCOUNTER — Telehealth: Payer: Self-pay

## 2018-09-03 NOTE — Telephone Encounter (Signed)
Pt has apt w/PCP to establish care next week 09/10/2018. She is out of Labetalol. She is requesting a refill from [facility] until her apt w/PCP. TG#[phone]

## 2018-09-03 NOTE — Telephone Encounter (Signed)
Please get [facility] to look at this and advise on refill.

## 2018-09-03 NOTE — Telephone Encounter (Signed)
[facility] please advise

## 2018-09-04 ENCOUNTER — Other Ambulatory Visit: Payer: Self-pay | Admitting: Maternal Newborn

## 2018-09-04 DIAGNOSIS — I10 Essential (primary) hypertension: Secondary | ICD-10-CM

## 2018-09-04 MED ORDER — LABETALOL HCL 200 MG PO TABS: 100.0000 mg | ORAL_TABLET | Freq: Two times a day (BID) | ORAL | 0 refills | Status: DC

## 2018-09-04 NOTE — Telephone Encounter (Signed)
fwding to [place]. Just spoke with [facility] on the phone and he advised that it was okay for someone to send in the refill on his behalf since he is out of the office. JS agreed to send it in to her pharm. Thank you!

## 2018-09-04 NOTE — Telephone Encounter (Signed)
New patient packet mailed.

## 2018-09-04 NOTE — Progress Notes (Signed)
Refill sent for labetalol, OK per telephone instructions from [facility].

## 2018-09-04 NOTE — Telephone Encounter (Signed)
Rx sent to pharmacy, please let me know if any issues.

## 2018-09-08 ENCOUNTER — Ambulatory Visit: Payer: BLUE CROSS/BLUE SHIELD | Admitting: Family Medicine

## 2018-09-09 ENCOUNTER — Encounter: Payer: Self-pay | Admitting: Family Medicine

## 2018-09-09 ENCOUNTER — Other Ambulatory Visit: Payer: Self-pay

## 2018-09-09 ENCOUNTER — Ambulatory Visit: Payer: BLUE CROSS/BLUE SHIELD | Admitting: Family Medicine

## 2018-09-09 VITALS — BP 188/104 | HR 72 | Temp 98.6°F | Resp 18 | Ht 62.0 in | Wt 184.0 lb

## 2018-09-09 DIAGNOSIS — Z8759 Personal history of other complications of pregnancy, childbirth and the puerperium: Secondary | ICD-10-CM

## 2018-09-09 DIAGNOSIS — I10 Essential (primary) hypertension: Secondary | ICD-10-CM | POA: Diagnosis not present

## 2018-09-09 MED ORDER — HYDRALAZINE HCL 25 MG PO TABS: 25.0000 mg | ORAL_TABLET | Freq: Two times a day (BID) | ORAL | 1 refills | Status: DC

## 2018-09-09 MED ORDER — LABETALOL HCL 200 MG PO TABS: 200.0000 mg | ORAL_TABLET | Freq: Two times a day (BID) | ORAL | 1 refills | Status: DC

## 2018-09-09 NOTE — Patient Instructions (Signed)

## 2018-09-09 NOTE — Progress Notes (Signed)
Subjective:    Patient ID: Mary Roberts, female    DOB: October 15, 1980, 38 y.o.   MRN: [id number]  HPI   Patient presents to clinic to establish PCP.  Main concern is getting blood pressure under control.  She had previously been on labetalol 100 mg twice daily, this was not helping BP.  Patient decided on her own to begin taking labetalol 200 mg twice daily, blood pressure still running high between the 160s over 90s to the 190s over 120s.  Denies any chest pain, palpitations, lower extremity swelling, shortness of breath or wheezing.  Patient states she did have severe preeclampsia while pregnant with her last child.  Patient also states she had been put on hydralazine in the past, but stopped taking it after 1 dose due to it making her stomach feel upset.  Also reports a few years ago she had taken hydrochlorothiazide, but reports that made her feel dizzy, so she stopped taking hydrochlorothiazide after a few days.  Patient's Pap smear is up-to-date, last completed in February 2020.  Vaccines are up-to-date  Patient is also following with GYN related to anemia from heavy menstrual bleeding.  Patient Active Problem List   Diagnosis Date Noted  . Menorrhagia with regular cycle 07/15/2018  . Anemia associated with acute blood loss 07/15/2018  . Uncontrolled hypertension 02/01/2018  . Anemia due to chronic blood loss 07/14/2016  . Bilateral pneumonia 07/14/2016  . HTN (hypertension) 07/14/2016  . Acute hypokalemia 07/14/2016  . Heavy menstrual bleeding 07/14/2016  . Intertrochanteric fracture of left femur ([place]) 07/14/2016  . Carpal tunnel syndrome, bilateral   . Pre-eclampsia, delivered 10/29/2013  . History of seizures/last episode >15 yrs ago 10/28/2013  . History of Severe pre-eclampsia 10/28/2013  . Previous cesarean delivery, antepartum condition or complication 81/44/8185  . Severe Preeclampsia superimposed on pre-existing hypertension, antepartum 04/29/2013  . Supervision of  high risk pregnancy in second trimester 04/29/2013   Social History   Tobacco Use  . Smoking status: Former Research scientist (life sciences)  . Smokeless tobacco: Never Used  Substance Use Topics  . Alcohol use: Yes    Comment: socially    Review of Systems  Constitutional: Negative for chills, fatigue and fever.  HENT: Negative for congestion, ear pain, sinus pain and sore throat.   Eyes: Negative.   Respiratory: Negative for cough, shortness of breath and wheezing.   Cardiovascular: Negative for chest pain, palpitations and leg swelling. +elevated BPs Gastrointestinal: Negative for abdominal pain, diarrhea, nausea and vomiting.  Genitourinary: Negative for dysuria, frequency and urgency.  Musculoskeletal: Negative for arthralgias and myalgias.  Skin: Negative for color change, pallor and rash.  Neurological: Negative for syncope, light-headedness and headaches.  Psychiatric/Behavioral: The patient is not nervous/anxious.       Objective:   Physical Exam Vitals signs and nursing note reviewed.  Constitutional:      Appearance: She is well-developed. She is obese. She is not ill-appearing.  HENT:     Head: Normocephalic and atraumatic.     Right Ear: Tympanic membrane, ear canal and external ear normal.     Left Ear: Tympanic membrane, ear canal and external ear normal.     Nose: Nose normal.     Mouth/Throat:     Mouth: Mucous membranes are moist.  Eyes:     General: No scleral icterus.       Right eye: No discharge.        Left eye: No discharge.     Extraocular Movements: Extraocular movements intact.  Conjunctiva/sclera: Conjunctivae normal.     Pupils: Pupils are equal, round, and reactive to light.  Neck:     Musculoskeletal: Normal range of motion and neck supple. No neck rigidity.     Vascular: No carotid bruit.     Trachea: No tracheal deviation.  Cardiovascular:     Rate and Rhythm: Normal rate and regular rhythm.     Heart sounds: Normal heart sounds. No murmur. No friction  rub. No gallop.   Pulmonary:     Effort: Pulmonary effort is normal. No respiratory distress.     Breath sounds: Normal breath sounds. No wheezing, rhonchi or rales.  Abdominal:     Tenderness: There is no guarding.  Musculoskeletal: Normal range of motion.        General: No deformity.  Skin:    General: Skin is warm and dry.     Capillary Refill: Capillary refill takes less than 2 seconds.     Coloration: Skin is not pale.     Findings: No erythema.  Neurological:     Mental Status: She is alert and oriented to person, place, and time.     Cranial Nerves: No cranial nerve deficit.  Psychiatric:        Behavior: Behavior normal.        Thought Content: Thought content normal.     Vitals:   09/09/18 0831 09/09/18 0900  BP: (!) 198/128 (!) 188/104  Pulse: 72   Resp: 18   Temp: 98.6 F (37 C)   SpO2: 98%     Wt Readings from Last 3 Encounters:  09/09/18 184 lb (83.5 kg)  07/29/18 182 lb (82.6 kg)  07/15/18 184 lb (83.5 kg)   Body mass index is 33.65 kg/m.     Assessment & Plan:    A total of 60 minutes were spent face-to-face with the patient during this encounter and over half of that time was spent on counseling and coordination of care. The patient was counseled on BP medications, extreme importance of getting BP under control  Essential hypertension- very long discussion with patient regards to absolute need to control blood pressure due to risk of multiple organ failure, heart attack, stroke, vascular disease, cancers. dementias and even death.  Patient is fearful of taking different medications due to them upsetting her stomach and making her feel dizzy.  Advised patient that her body is used to being at a very high pressure gradient, so blood pressure medication making her blood pressure lower, most likely cause some dizziness but after being on medication for period of time I do expect the dizziness to resolve.  Patient advised that once body becomes used to being  in the normal BP range after being on medication, symptoms of dizziness should resolve.  I encouraged patient to allow me to recheck some blood work including complete blood count, lipids, thyorid and electrolyte panel however she declines due to having multiple lab tests within the ER in January 2020  She also declines return to clinic in 1 to 2 weeks for blood pressure follow-up, but is agreeable to return in 4 weeks

## 2018-09-10 ENCOUNTER — Ambulatory Visit: Payer: BLUE CROSS/BLUE SHIELD | Admitting: Family Medicine

## 2018-09-27 ENCOUNTER — Other Ambulatory Visit: Payer: Self-pay | Admitting: Maternal Newborn

## 2018-09-27 DIAGNOSIS — I10 Essential (primary) hypertension: Secondary | ICD-10-CM

## 2018-10-13 ENCOUNTER — Encounter: Payer: Self-pay | Admitting: Family Medicine

## 2018-10-13 ENCOUNTER — Other Ambulatory Visit: Payer: Self-pay

## 2018-10-13 ENCOUNTER — Ambulatory Visit (INDEPENDENT_AMBULATORY_CARE_PROVIDER_SITE_OTHER): Payer: BLUE CROSS/BLUE SHIELD | Admitting: Family Medicine

## 2018-10-13 VITALS — BP 162/112 | HR 79 | Temp 98.9°F | Resp 18 | Ht 62.5 in | Wt 185.0 lb

## 2018-10-13 DIAGNOSIS — I10 Essential (primary) hypertension: Secondary | ICD-10-CM

## 2018-10-13 DIAGNOSIS — L299 Pruritus, unspecified: Secondary | ICD-10-CM | POA: Diagnosis not present

## 2018-10-13 MED ORDER — LABETALOL HCL 200 MG PO TABS: 400.0000 mg | ORAL_TABLET | Freq: Two times a day (BID) | ORAL | 1 refills | Status: DC

## 2018-10-13 MED ORDER — HYDRALAZINE HCL 50 MG PO TABS: 50.0000 mg | ORAL_TABLET | Freq: Two times a day (BID) | ORAL | 1 refills | Status: DC

## 2018-10-13 NOTE — Patient Instructions (Signed)
Take 2 of the 25 mg hydralazine (50 mg) 2 times per day until supply used, then start new rx sent in  Take 2 of the 200 mg labetalol (400 mg) 2 times per day until supply used, then start new rx sent in

## 2018-10-13 NOTE — Progress Notes (Signed)
Subjective:    Patient ID: Mary Roberts, female    DOB: Jun 13, 1981, 38 y.o.   MRN: [id number]  HPI   Patient presents to clinic for follow-up on blood pressure.  Patient's blood pressure has been difficult to control for the past couple of months.  We did increase her labetalol to 200 mg twice daily and patient was advised to resume taking her hydralazine that she had stopped taking on her own.  Blood pressures continue to still run high.  Patient states that she checks at home they will be in the 150s to 160s over 100s/110s.  Patient denies any symptoms or feelings of unwellness.  Denies chest pain, palpitations, fatigue.  Denies lower extremity swelling.  Denies shortness of breath or chest pain.  Denies GI or GU issues.  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  Complains of some dry and itchy skin, will have it on her hands on occasion and sometimes will have itchiness of skin of forearms immediately after showering or immediately after eating all types of food; states this has been happening for years.  She cannot pinpoint any certain food that seems to exacerbate itching.  She does not lotion or moisturize skin regularly, will put lotion on on occasion.  Patient Active Problem List   Diagnosis Date Noted  . Menorrhagia with regular cycle 07/15/2018  . Anemia associated with acute blood loss 07/15/2018  . Uncontrolled hypertension 02/01/2018  . Anemia due to chronic blood loss 07/14/2016  . Bilateral pneumonia 07/14/2016  . HTN (hypertension) 07/14/2016  . Acute hypokalemia 07/14/2016  . Heavy menstrual bleeding 07/14/2016  . Intertrochanteric fracture of left femur ([place]) 07/14/2016  . Carpal tunnel syndrome, bilateral   . Pre-eclampsia, delivered 10/29/2013  . History of seizures/last episode >15 yrs ago 10/28/2013  . History of Severe pre-eclampsia 10/28/2013  . Previous cesarean delivery,  antepartum condition or complication 14/97/0263  . Severe Preeclampsia superimposed on pre-existing hypertension, antepartum 04/29/2013  . Supervision of high risk pregnancy in second trimester 04/29/2013   Social History   Tobacco Use  . Smoking status: Former Research scientist (life sciences)  . Smokeless tobacco: Never Used  Substance Use Topics  . Alcohol use: Yes    Comment: socially    Review of Systems  Constitutional: Negative for chills, fatigue and fever.  HENT: Negative for congestion, ear pain, sinus pain and sore throat.   Eyes: Negative.   Respiratory: Negative for cough, shortness of breath and wheezing.   Cardiovascular: Negative for chest pain, palpitations and leg swelling.  Gastrointestinal: Negative for abdominal pain, diarrhea, nausea and vomiting.  Genitourinary: Negative for dysuria, frequency and urgency.  Musculoskeletal: Negative for arthralgias and myalgias.  Skin: Negative for color change, pallor and rash.  Neurological: Negative for syncope, light-headedness and headaches.  Psychiatric/Behavioral: The patient is not nervous/anxious.       Objective:   Physical Exam  Constitutional: She is oriented to person, place, and time. She appears well-developed and well-nourished. No distress.  HENT:  Head: Normocephalic and atraumatic.  Eyes: Pupils are equal, round, and reactive to light. EOM are normal. No scleral icterus.  Neck: Normal range of motion. Neck supple. No tracheal deviation present.  Cardiovascular: Normal rate, regular rhythm and normal heart sounds.  Pulmonary/Chest: Effort normal and breath sounds normal. No respiratory distress.  Neurological: She is alert and oriented to person, place, and time.  Gait normal  Skin: Skin is warm and dry. No  pallor. No flaking seen of skin. Psychiatric: She has a normal mood and affect. Her behavior is normal. Thought content normal.  Nursing note and vitals reviewed.   Vitals:   10/13/18 0812  BP: (!) 162/112  Pulse: 79   Resp: 18  Temp: 98.9 F (37.2 C)  SpO2: 99%   BP Readings from Last 3 Encounters:  10/13/18 (!) 162/112  09/09/18 (!) 188/104  07/29/18 124/78      Assessment & Plan:    A total of 25  minutes were spent face-to-face with the patient during this encounter and over half of that time was spent on counseling and coordination of care. The patient was counseled on BP meds, importance of BP control, strategy to help dry skin.    Essential hypertension-patient's BP is far from our goal.  We will increase labetalol to 400 mg twice daily and hydralazine to 50 mg twice daily.  Patient would prefer to try increasing these doses of medications rather than adding on a third medication.  Advised patient that sometimes blood pressure can be difficult to control in certain patients, but we must work hard to get BP under control due to risk of uncontrolled blood pressure causing vascular disease, heart attack, stroke, organ failure, and even death.  Itching of skin/dry skin-this could potentially be related to dry skin.  Patient advised to try a good moisturizing cream or lotion such as Cetaphil or CeraVe L at least once per day to see if this will help reduce itching sensation.  The itching occurring after eating could potentially be a sensitivity or an allergy to a preservative or some thing in the food she is eating.  Blood work recommended to further investigate patient's itching symptoms and also to follow-up on electrolytes, liver, kidney functions, blood counts and to check cholesterol levels and thyroid, but patient declines blood work again due to having labs drawn in the emergency department in January 2020.  Patient would prefer not to have to return to clinic anytime soon.  She is agreeable to follow-up visit in 3 months for recheck of blood pressure.  Advised to please message [place] via my chart in the next 2 to 4 weeks with some updated blood pressure readings.

## 2018-11-23 ENCOUNTER — Other Ambulatory Visit: Payer: Self-pay

## 2018-11-23 ENCOUNTER — Ambulatory Visit: Payer: Self-pay | Admitting: Family Medicine

## 2018-11-23 ENCOUNTER — Ambulatory Visit (INDEPENDENT_AMBULATORY_CARE_PROVIDER_SITE_OTHER): Payer: BLUE CROSS/BLUE SHIELD | Admitting: Family Medicine

## 2018-11-23 DIAGNOSIS — R6889 Other general symptoms and signs: Secondary | ICD-10-CM

## 2018-11-23 DIAGNOSIS — Z20828 Contact with and (suspected) exposure to other viral communicable diseases: Secondary | ICD-10-CM

## 2018-11-23 DIAGNOSIS — I10 Essential (primary) hypertension: Secondary | ICD-10-CM | POA: Diagnosis not present

## 2018-11-23 DIAGNOSIS — J029 Acute pharyngitis, unspecified: Secondary | ICD-10-CM

## 2018-11-23 DIAGNOSIS — Z20822 Contact with and (suspected) exposure to covid-19: Secondary | ICD-10-CM

## 2018-11-23 NOTE — Progress Notes (Addendum)
Patient ID: Mary Roberts, female   DOB: Nov 27, 1980, 38 y.o.   MRN: [id number]    Virtual Visit via video Note  This visit type was conducted due to national recommendations for restrictions regarding the COVID-19 pandemic (e.g. social distancing).  This format is felt to be most appropriate for this patient at this time.  All issues noted in this document were discussed and addressed.  No physical exam was performed (except for noted visual exam findings with Video Visits).   I connected with Mary Roberts today at  2:20 PM EDT by a video enabled telemedicine application and verified that I am speaking with the correct person using two identifiers. Location patient: home Location provider: [facility] Persons participating in the virtual visit: patient, provider  I discussed the limitations, risks, security and privacy concerns of performing an evaluation and management service by video and the availability of in person appointments. I also discussed with the patient that there may be a patient responsible charge related to this service. The patient expressed understanding and agreed to proceed.  HPI:  Patient and I connected via video due to complaint of sore throat.  Patient has had sore throat for 1 day.  Denies any white spots visible on back of throat, denies any drooling or inability to swallow.  Denies fever or chills.  Denies cough or wheezing.  Denies chest pain.  Denies nausea, vomiting or diarrhea.  Denies any GU complaints.  Patient is concerned that the sore throat potentially could be COVID-19.  Patient's main goal of today's appointment is to get a work note so she needs to remain on self quarantine.  Patient also has been monitoring her BP since we increase blood pressure medication doses labetalol 400 mg twice daily and hydralazine 50 mg twice daily.  States she will have sometimes where her BP will be 180 over 90s, but then a few hours after taking blood pressure  medicine and it takes effect her blood pressure be in the 130s over 80s.  Denies any lower extremity swelling, chest pain or palpitations.  ROS: See pertinent positives and negatives per HPI.  Past Medical History:  Diagnosis Date  . Anemia   . Anxiety    panic attacks  . Carpal tunnel syndrome, bilateral   . Chlamydia   . Epilepsy seizure, nonconvulsive, generalized ([place])   . Headache(784.0)    migraines  . History of C-section   . Hypertension   . Infection    UTI  . Migraines   . Pregnancy induced hypertension    with first  . Seizures ([place])    No meds, off since in mid to late 20's  . UTI (urinary tract infection)     Past Surgical History:  Procedure Laterality Date  . CESAREAN SECTION    . MOUTH SURGERY    . TUBAL LIGATION N/A 10/30/2013   Procedure: POST PARTUM TUBAL LIGATION;  Surgeon: [name], MD;  Location: [facility];  Service: Gynecology;  Laterality: N/A;    Family History  Problem Relation Age of Onset  . Seizures Mother   . Hypertension Mother   . Diabetes Mother   . Heart disease Mother   . Stroke Mother   . Arthritis Mother   . Seizures Brother   . Hearing loss Neg Hx    Social History   Tobacco Use  . Smoking status: Former Research scientist (life sciences)  . Smokeless tobacco: Never Used  Substance Use Topics  . Alcohol use: Yes  Comment: socially     Current Outpatient Medications:  .  hydrALAZINE (APRESOLINE) 50 MG tablet, Take 1 tablet (50 mg total) by mouth 2 (two) times daily., Disp: 180 tablet, Rfl: 1 .  labetalol (NORMODYNE) 200 MG tablet, Take 2 tablets (400 mg total) by mouth 2 (two) times daily., Disp: 360 tablet, Rfl: 1 .  norethindrone (AYGESTIN) 5 MG tablet, Take 1 tablet (5 mg total) by mouth daily., Disp: 30 tablet, Rfl: 11  EXAM:  GENERAL: alert, oriented, appears well and in no acute distress  HEENT: atraumatic, conjunttiva clear, no obvious abnormalities on inspection of external nose and ears  NECK: normal movements of the head and neck   LUNGS: on inspection no signs of respiratory distress, breathing rate appears normal, no obvious gross SOB, gasping or wheezing  CV: no obvious cyanosis  MS: moves all visible extremities without noticeable abnormality  PSYCH/NEURO: pleasant and cooperative, no obvious depression or anxiety, speech and thought processing grossly intact  ASSESSMENT AND PLAN:  Discussed the following assessment and plan:  Sore throat  Suspected Covid-19 Virus Infection  Essential hypertension  Discussed with patient that with her sore throat potentially could be COVID-19, strep throat, sore throat related to postnasal drainage or seasonal allergies and is hard for me to differentiate determine exactly which 1 without further testing.  Patient offered to be set up for COVID-19 test multiple times, but she declines offer each time.  Patient states she does not want to have the test done because she does not want the swabs out of her nose.  Patient aware that I can write a note stating she needs to be out of work due to sore throat symptoms that potentially be COVID-19, but I also ran out that she has refused testing.  Advised patient that I am unsure of what her employer want to do, but I would highly recommend she be tested because the testing is available to our patients.  Patient states she will reconsider testing and let us know if she wants me to put in orders for her to be tested.  Patient's BP appears to be improved since increasing doses of medications.  We will continue to monitor closely.   I discussed the assessment and treatment plan with the patient. The patient was provided an opportunity to ask questions and all were answered. The patient agreed with the plan and demonstrated an understanding of the instructions.   The patient was advised to call back or seek an in-person evaluation if the symptoms worsen or if the condition fails to improve as anticipated.   A total of 25  minutes were  spent face-to-face with the patient during this encounter and over half of that time was spent on counseling and coordination of care. The patient was counseled on why getting tested for COVID 19 would be a good idea; BP management   Patient will follow-up in about 4 to 6 weeks for recheck on blood pressure in office.  Jodelle Green, FNP

## 2018-11-23 NOTE — Telephone Encounter (Signed)
Has appt with Ander Purpura

## 2018-11-23 NOTE — Telephone Encounter (Signed)
  Pt. Reports she woke up with a sore throat this morning and is concerned with COVID 19. Karen in the practice will call pt. Back about a virtual visit. Answer Assessment - Initial Assessment Questions 1. COVID-19 DIAGNOSIS: "Who made your Coronavirus (COVID-19) diagnosis?" "Was it confirmed by a positive lab test?" If not diagnosed by a HCP, ask "Are there lots of cases (community spread) where you live?" (See public health department website, if unsure)   * MAJOR community spread: high number of cases; numbers of cases are increasing; many people hospitalized.   * MINOR community spread: low number of cases; not increasing; few or no people hospitalized     No test 2. ONSET: "When did the COVID-19 symptoms start?"      This morning 3. WORST SYMPTOM: "What is your worst symptom?" (e.g., cough, fever, shortness of breath, muscle aches)     Sore throat 4. COUGH: "Do you have a cough?" If so, ask: "How bad is the cough?"       Dry cough 5. FEVER: "Do you have a fever?" If so, ask: "What is your temperature, how was it measured, and when did it start?"     No 6. RESPIRATORY STATUS: "Describe your breathing?" (e.g., shortness of breath, wheezing, unable to speak)      No 7. BETTER-SAME-WORSE: "Are you getting better, staying the same or getting worse compared to yesterday?"  If getting worse, ask, "In what way?"     Same 8. HIGH RISK DISEASE: "Do you have any chronic medical problems?" (e.g., asthma, heart or lung disease, weak immune system, etc.)     HTN 9. PREGNANCY: "Is there any chance you are pregnant?" "When was your last menstrual period?"     No 10. OTHER SYMPTOMS: "Do you have any other symptoms?"  (e.g., runny nose, headache, sore throat, loss of smell)       Sore throat  Protocols used: CORONAVIRUS (COVID-19) DIAGNOSED OR SUSPECTED-A-AH

## 2018-11-24 ENCOUNTER — Telehealth: Payer: Self-pay | Admitting: Family Medicine

## 2018-11-24 ENCOUNTER — Encounter: Payer: Self-pay | Admitting: Family Medicine

## 2018-11-24 NOTE — Telephone Encounter (Signed)
Called Pt and scheduled a OV  appt for July

## 2018-11-24 NOTE — Telephone Encounter (Signed)
Please schedule a BP follow up with me for patient sometime in July to monitor BP/make med changes if needed  Thanks  LG

## 2018-12-25 ENCOUNTER — Ambulatory Visit: Payer: Self-pay | Admitting: *Deleted

## 2018-12-25 NOTE — Telephone Encounter (Signed)
Summary: BP questions    Pt needs advice on what to do. She cannot remember if she took her blood pressure medication (labetalol (NORMODYNE) 200 MG tablet + hydrALAZINE (APRESOLINE) 50 MG tablet)  BP reported at 10:45 - 188/112          Attempted to call patient, no answer. Left message for patient to call back at [phone] and ask to speak to me or any triage nurse.

## 2019-01-04 ENCOUNTER — Encounter: Payer: Self-pay | Admitting: Family Medicine

## 2019-01-04 ENCOUNTER — Ambulatory Visit (INDEPENDENT_AMBULATORY_CARE_PROVIDER_SITE_OTHER): Payer: BC Managed Care – PPO | Admitting: Family Medicine

## 2019-01-04 ENCOUNTER — Other Ambulatory Visit: Payer: Self-pay

## 2019-01-04 VITALS — BP 176/96 | HR 77 | Temp 99.0°F | Resp 18 | Ht 62.5 in | Wt 189.1 lb

## 2019-01-04 DIAGNOSIS — I10 Essential (primary) hypertension: Secondary | ICD-10-CM

## 2019-01-04 NOTE — Progress Notes (Signed)
Subjective:    Patient ID: Mary Roberts, female    DOB: April 19, 1981, 38 y.o.   MRN: [id number]  HPI   Patient presents to clinic for follow-up on blood pressure.  We are still having a difficult time getting BP managed well.  Last visit patient was advised to increase labetalol to 400 mg twice daily, but she continued taking 200 mg twice daily and only increased hydralazine to 50 mg twice daily.  States that she checks blood pressures at home they usually run in the 160s to 170s over 90s to low 100s.  Denies feeling any chest pain, palpitations, shortness of breath, lower extremity swelling.  Patient had been on hydrochlorothiazide and believes she had been also on lisinopril in the past but did not like how these medications made her feel.  Especially remembers the hydrochlorothiazide causing nausea.  Patient ended up being started on labetalol during her pregnancy and she had preeclampsia.   Patient Active Problem List   Diagnosis Date Noted  . Menorrhagia with regular cycle 07/15/2018  . Anemia associated with acute blood loss 07/15/2018  . Uncontrolled hypertension 02/01/2018  . Anemia due to chronic blood loss 07/14/2016  . Bilateral pneumonia 07/14/2016  . HTN (hypertension) 07/14/2016  . Acute hypokalemia 07/14/2016  . Heavy menstrual bleeding 07/14/2016  . Intertrochanteric fracture of left femur ([place]) 07/14/2016  . Carpal tunnel syndrome, bilateral   . Pre-eclampsia, delivered 10/29/2013  . History of seizures/last episode >15 yrs ago 10/28/2013  . History of Severe pre-eclampsia 10/28/2013  . Previous cesarean delivery, antepartum condition or complication 55/97/4163  . Severe Preeclampsia superimposed on pre-existing hypertension, antepartum 04/29/2013  . Supervision of high risk pregnancy in second trimester 04/29/2013   Social History   Tobacco Use  . Smoking status: Former Research scientist (life sciences)  . Smokeless tobacco: Never Used  Substance Use Topics  . Alcohol use: Yes   Comment: socially    Review of Systems  Constitutional: Negative for chills, fatigue and fever.  HENT: Negative for congestion, ear pain, sinus pain and sore throat.   Eyes: Negative.   Respiratory: Negative for cough, shortness of breath and wheezing.   Cardiovascular: Negative for chest pain, palpitations and leg swelling.  Gastrointestinal: Negative for abdominal pain, diarrhea, nausea and vomiting.  Genitourinary: Negative for dysuria, frequency and urgency.  Musculoskeletal: Negative for arthralgias and myalgias.  Skin: Negative for color change, pallor and rash.  Neurological: Negative for syncope, light-headedness and headaches.  Psychiatric/Behavioral: The patient is not nervous/anxious.        Objective:   Physical Exam Vitals signs and nursing note reviewed.  Constitutional:      General: She is not in acute distress.    Appearance: She is not ill-appearing, toxic-appearing or diaphoretic.  HENT:     Head: Normocephalic and atraumatic.  Eyes:     General: No scleral icterus.    Extraocular Movements: Extraocular movements intact.     Conjunctiva/sclera: Conjunctivae normal.     Pupils: Pupils are equal, round, and reactive to light.  Neck:     Musculoskeletal: Normal range of motion. No neck rigidity.     Vascular: No carotid bruit.  Cardiovascular:     Rate and Rhythm: Normal rate and regular rhythm.  Pulmonary:     Effort: Pulmonary effort is normal. No respiratory distress.     Breath sounds: Normal breath sounds. No wheezing, rhonchi or rales.  Musculoskeletal:     Right lower leg: No edema.     Left lower leg:  No edema.  Skin:    General: Skin is warm and dry.     Coloration: Skin is not jaundiced or pale.  Neurological:     Mental Status: She is alert and oriented to person, place, and time.  Psychiatric:        Mood and Affect: Mood normal.        Behavior: Behavior normal.        Thought Content: Thought content normal.        Judgment: Judgment  normal.    BP Readings from Last 3 Encounters:  10/13/18 (!) 162/112  09/09/18 (!) 188/104  07/29/18 124/78   Vitals:   01/04/19 1432 01/04/19 1445  BP: (!) 198/102 (!) 176/96  Pulse: 77   Resp: 18   Temp: 99 F (37.2 C)   SpO2: 98%        Assessment & Plan:    A total of 25  minutes were spent face-to-face with the patient during this encounter and over half of that time was spent on counseling and coordination of care. The patient was counseled on BP meds, BP control importance, need for specialist.   Uncontrolled hypertension -- patient advised to increase the labetalol to 40 mg twice daily and continue hydralazine 50 mg twice daily.  Also had long discussion with patient in regards to since we are having an extremely difficult time getting blood pressure controlled that I strongly recommend she see cardiology.  Patient is weary of seeing specialist due to be concerned of where whether her insurance will cover.  Reassured patient that most insurances do cover specialist care with a referral.  After knowing this patient is agreeable to referral to cardiology.  Patient has not had lab work since January 2020, offered to order lab work and get rechecked today however patient declines.  Advised if she does not hear anything about the cardiology referral in the next 4 to 7 days to let us know.  Reiterated to patient the importance of seeing cardiologist to get blood pressure under good control.  We will plan to have patient follow-up in approx 2 or 3 months.  Advised to follow-up sooner if any issues arise.

## 2019-01-04 NOTE — Patient Instructions (Signed)
Cardiology referral  Increase labetalol to 400 mg twice per day (take 2 of the 200 mg tablets 2x per day)

## 2019-01-07 ENCOUNTER — Observation Stay
Admission: EM | Admit: 2019-01-07 | Discharge: 2019-01-08 | Disposition: A | Discharge status: Home / Self Care | Payer: BC Managed Care – PPO | Attending: Internal Medicine | Admitting: Internal Medicine

## 2019-01-07 ENCOUNTER — Inpatient Hospital Stay: Payer: BC Managed Care – PPO

## 2019-01-07 ENCOUNTER — Other Ambulatory Visit: Payer: Self-pay

## 2019-01-07 ENCOUNTER — Emergency Department: Payer: BC Managed Care – PPO

## 2019-01-07 DIAGNOSIS — G5603 Carpal tunnel syndrome, bilateral upper limbs: Secondary | ICD-10-CM | POA: Diagnosis not present

## 2019-01-07 DIAGNOSIS — G40909 Epilepsy, unspecified, not intractable, without status epilepticus: Secondary | ICD-10-CM | POA: Insufficient documentation

## 2019-01-07 DIAGNOSIS — I10 Essential (primary) hypertension: Secondary | ICD-10-CM | POA: Diagnosis not present

## 2019-01-07 DIAGNOSIS — I161 Hypertensive emergency: Secondary | ICD-10-CM | POA: Diagnosis not present

## 2019-01-07 DIAGNOSIS — I2699 Other pulmonary embolism without acute cor pulmonale: Secondary | ICD-10-CM

## 2019-01-07 DIAGNOSIS — I2693 Single subsegmental pulmonary embolism without acute cor pulmonale: Secondary | ICD-10-CM | POA: Diagnosis not present

## 2019-01-07 DIAGNOSIS — I160 Hypertensive urgency: Secondary | ICD-10-CM | POA: Diagnosis not present

## 2019-01-07 DIAGNOSIS — D649 Anemia, unspecified: Secondary | ICD-10-CM | POA: Diagnosis not present

## 2019-01-07 DIAGNOSIS — F419 Anxiety disorder, unspecified: Secondary | ICD-10-CM | POA: Diagnosis not present

## 2019-01-07 DIAGNOSIS — Z79899 Other long term (current) drug therapy: Secondary | ICD-10-CM | POA: Insufficient documentation

## 2019-01-07 DIAGNOSIS — Z793 Long term (current) use of hormonal contraceptives: Secondary | ICD-10-CM | POA: Diagnosis not present

## 2019-01-07 DIAGNOSIS — Z87891 Personal history of nicotine dependence: Secondary | ICD-10-CM | POA: Insufficient documentation

## 2019-01-07 DIAGNOSIS — R002 Palpitations: Secondary | ICD-10-CM | POA: Insufficient documentation

## 2019-01-07 DIAGNOSIS — R9431 Abnormal electrocardiogram [ECG] [EKG]: Secondary | ICD-10-CM | POA: Diagnosis not present

## 2019-01-07 DIAGNOSIS — I159 Secondary hypertension, unspecified: Secondary | ICD-10-CM

## 2019-01-07 DIAGNOSIS — E876 Hypokalemia: Secondary | ICD-10-CM | POA: Diagnosis not present

## 2019-01-07 DIAGNOSIS — R079 Chest pain, unspecified: Secondary | ICD-10-CM | POA: Diagnosis not present

## 2019-01-07 LAB — CBC WITH DIFFERENTIAL/PLATELET
Abs Immature Granulocytes: 0.02 10*3/uL (ref 0.00–0.07)
Basophils Absolute: 0 10*3/uL (ref 0.0–0.1)
Basophils Relative: 0 %
Eosinophils Absolute: 0.1 10*3/uL (ref 0.0–0.5)
Eosinophils Relative: 1 %
HCT: 30.5 % — ABNORMAL LOW (ref 36.0–46.0)
Hemoglobin: 9.8 g/dL — ABNORMAL LOW (ref 12.0–15.0)
Immature Granulocytes: 0 %
Lymphocytes Relative: 24 %
Lymphs Abs: 1.4 10*3/uL (ref 0.7–4.0)
MCH: 28.6 pg (ref 26.0–34.0)
MCHC: 32.1 g/dL (ref 30.0–36.0)
MCV: 88.9 fL (ref 80.0–100.0)
Monocytes Absolute: 0.6 10*3/uL (ref 0.1–1.0)
Monocytes Relative: 10 %
Neutro Abs: 3.6 10*3/uL (ref 1.7–7.7)
Neutrophils Relative %: 65 %
Platelets: 217 10*3/uL (ref 150–400)
RBC: 3.43 MIL/uL — ABNORMAL LOW (ref 3.87–5.11)
RDW: 14.9 % (ref 11.5–15.5)
WBC: 5.7 10*3/uL (ref 4.0–10.5)
nRBC: 0 % (ref 0.0–0.2)

## 2019-01-07 LAB — COMPREHENSIVE METABOLIC PANEL
ALT: 12 U/L (ref 0–44)
AST: 16 U/L (ref 15–41)
Albumin: 3.9 g/dL (ref 3.5–5.0)
Alkaline Phosphatase: 37 U/L — ABNORMAL LOW (ref 38–126)
Anion gap: 8 (ref 5–15)
BUN: 13 mg/dL (ref 6–20)
CO2: 25 mmol/L (ref 22–32)
Calcium: 9 mg/dL (ref 8.9–10.3)
Chloride: 104 mmol/L (ref 98–111)
Creatinine, Ser: 0.88 mg/dL (ref 0.44–1.00)
GFR calc Af Amer: >60 mL/min (ref >60)
GFR calc non Af Amer: >60 mL/min (ref >60)
Glucose, Bld: 98 mg/dL (ref 70–99)
Potassium: 3.4 mmol/L — ABNORMAL LOW (ref 3.5–5.1)
Sodium: 137 mmol/L (ref 135–145)
Total Bilirubin: 0.7 mg/dL (ref 0.3–1.2)
Total Protein: 7.5 g/dL (ref 6.5–8.1)

## 2019-01-07 LAB — PROTIME-INR
INR: 1.2 (ref 0.8–1.2)
Prothrombin Time: 14.9 seconds (ref 11.4–15.2)

## 2019-01-07 LAB — CBC
HCT: 30.7 % — ABNORMAL LOW (ref 36.0–46.0)
Hemoglobin: 9.9 g/dL — ABNORMAL LOW (ref 12.0–15.0)
MCH: 28.9 pg (ref 26.0–34.0)
MCHC: 32.2 g/dL (ref 30.0–36.0)
MCV: 89.8 fL (ref 80.0–100.0)
Platelets: 229 10*3/uL (ref 150–400)
RBC: 3.42 MIL/uL — ABNORMAL LOW (ref 3.87–5.11)
RDW: 14.8 % (ref 11.5–15.5)
WBC: 4.8 10*3/uL (ref 4.0–10.5)
nRBC: 0 % (ref 0.0–0.2)

## 2019-01-07 LAB — BASIC METABOLIC PANEL
Anion gap: 9 (ref 5–15)
BUN: 9 mg/dL (ref 6–20)
CO2: 24 mmol/L (ref 22–32)
Calcium: 8.8 mg/dL — ABNORMAL LOW (ref 8.9–10.3)
Chloride: 102 mmol/L (ref 98–111)
Creatinine, Ser: 0.74 mg/dL (ref 0.44–1.00)
GFR calc Af Amer: >60 mL/min (ref >60)
GFR calc non Af Amer: >60 mL/min (ref >60)
Glucose, Bld: 92 mg/dL (ref 70–99)
Potassium: 3.2 mmol/L — ABNORMAL LOW (ref 3.5–5.1)
Sodium: 135 mmol/L (ref 135–145)

## 2019-01-07 LAB — TSH: TSH: 1.693 u[IU]/mL (ref 0.350–4.500)

## 2019-01-07 LAB — TROPONIN I (HIGH SENSITIVITY)
Troponin I (High Sensitivity): 14 ng/L (ref <18)
Troponin I (High Sensitivity): 16 ng/L (ref <18)
Troponin I (High Sensitivity): 5 ng/L (ref <18)

## 2019-01-07 LAB — APTT: aPTT: 34 seconds (ref 24–36)

## 2019-01-07 LAB — HEPARIN LEVEL (UNFRACTIONATED)
Heparin Unfractionated: 0.33 IU/mL (ref 0.30–0.70)
Heparin Unfractionated: 0.31 IU/mL (ref 0.30–0.70)

## 2019-01-07 MED ORDER — HYDRALAZINE HCL 20 MG/ML IJ SOLN
10.0000 mg | Freq: Four times a day (QID) | INTRAMUSCULAR | Status: DC | PRN
  Administered 2019-01-07: 10 mg via INTRAVENOUS
  Filled 2019-01-07: qty 1

## 2019-01-07 MED ORDER — LABETALOL HCL 5 MG/ML IV SOLN
20.0000 mg | Freq: Once | INTRAVENOUS | Status: AC
  Administered 2019-01-07 (×2): 10 mg via INTRAVENOUS
  Filled 2019-01-07: qty 4

## 2019-01-07 MED ORDER — PANTOPRAZOLE SODIUM 40 MG IV SOLR
40.0000 mg | INTRAVENOUS | Status: DC
  Administered 2019-01-07 – 2019-01-08 (×2): 40 mg via INTRAVENOUS
  Filled 2019-01-07 (×2): qty 40

## 2019-01-07 MED ORDER — NORETHINDRONE ACETATE 5 MG PO TABS: 5.0000 mg | ORAL_TABLET | Freq: Every day | ORAL | Status: DC

## 2019-01-07 MED ORDER — POTASSIUM CHLORIDE 20 MEQ PO PACK
20.0000 meq | PACK | Freq: Once | ORAL | Status: AC
  Administered 2019-01-07: 20 meq via ORAL
  Filled 2019-01-07: qty 1

## 2019-01-07 MED ORDER — IOPAMIDOL (ISOVUE-370) INJECTION 76%
100.0000 mL | Freq: Once | INTRAVENOUS | Status: AC | PRN
  Administered 2019-01-07: 100 mL via INTRAVENOUS

## 2019-01-07 MED ORDER — NITROGLYCERIN 2 % TD OINT
1.0000 [in_us] | TOPICAL_OINTMENT | Freq: Once | TRANSDERMAL | Status: AC
  Administered 2019-01-07: 1 [in_us] via TOPICAL
  Filled 2019-01-07: qty 1

## 2019-01-07 MED ORDER — SODIUM CHLORIDE 0.9% FLUSH: 3.0000 mL | INTRAVENOUS | Status: DC | PRN

## 2019-01-07 MED ORDER — HYDRALAZINE HCL 50 MG PO TABS
50.0000 mg | ORAL_TABLET | Freq: Three times a day (TID) | ORAL | Status: DC
  Administered 2019-01-07 – 2019-01-08 (×5): 50 mg via ORAL
  Filled 2019-01-07 (×4): qty 1

## 2019-01-07 MED ORDER — ACETAMINOPHEN 650 MG RE SUPP: 650.0000 mg | Freq: Four times a day (QID) | RECTAL | Status: DC | PRN

## 2019-01-07 MED ORDER — LABETALOL HCL 200 MG PO TABS
400.0000 mg | ORAL_TABLET | Freq: Two times a day (BID) | ORAL | Status: DC
  Administered 2019-01-07 – 2019-01-08 (×3): 400 mg via ORAL
  Filled 2019-01-07 (×5): qty 2

## 2019-01-07 MED ORDER — CHLORTHALIDONE 25 MG PO TABS
25.0000 mg | ORAL_TABLET | Freq: Every day | ORAL | Status: DC
  Administered 2019-01-07 – 2019-01-08 (×2): 25 mg via ORAL
  Filled 2019-01-07 (×2): qty 1

## 2019-01-07 MED ORDER — SODIUM CHLORIDE 0.9% FLUSH
3.0000 mL | Freq: Two times a day (BID) | INTRAVENOUS | Status: DC
  Administered 2019-01-07 – 2019-01-08 (×2): 3 mL via INTRAVENOUS

## 2019-01-07 MED ORDER — SODIUM CHLORIDE 0.9 % IV SOLN: 250.0000 mL | INTRAVENOUS | Status: DC | PRN

## 2019-01-07 MED ORDER — CHLORTHALIDONE 25 MG PO TABS
12.5000 mg | ORAL_TABLET | Freq: Every day | ORAL | Status: DC
  Filled 2019-01-07: qty 0.5

## 2019-01-07 MED ORDER — HYDRALAZINE HCL 50 MG PO TABS
50.0000 mg | ORAL_TABLET | Freq: Two times a day (BID) | ORAL | Status: DC
  Filled 2019-01-07: qty 1

## 2019-01-07 MED ORDER — SODIUM CHLORIDE 0.9% FLUSH: 3.0000 mL | Freq: Two times a day (BID) | INTRAVENOUS | Status: DC

## 2019-01-07 MED ORDER — POLYETHYLENE GLYCOL 3350 17 G PO PACK: 17.0000 g | PACK | Freq: Every day | ORAL | Status: DC | PRN

## 2019-01-07 MED ORDER — HEPARIN BOLUS VIA INFUSION
5000.0000 [IU] | Freq: Once | INTRAVENOUS | Status: AC
  Administered 2019-01-07: 5000 [IU] via INTRAVENOUS
  Filled 2019-01-07: qty 5000

## 2019-01-07 MED ORDER — ONDANSETRON HCL 4 MG/2ML IJ SOLN: 4.0000 mg | Freq: Four times a day (QID) | INTRAMUSCULAR | Status: DC | PRN

## 2019-01-07 MED ORDER — HEPARIN (PORCINE) 25000 UT/250ML-% IV SOLN
1250.0000 [IU]/h | INTRAVENOUS | Status: DC
  Administered 2019-01-07 (×2): 1150 [IU]/h via INTRAVENOUS
  Filled 2019-01-07 (×2): qty 250

## 2019-01-07 MED ORDER — ONDANSETRON HCL 4 MG PO TABS: 4.0000 mg | ORAL_TABLET | Freq: Four times a day (QID) | ORAL | Status: DC | PRN

## 2019-01-07 MED ORDER — ACETAMINOPHEN 325 MG PO TABS
650.0000 mg | ORAL_TABLET | Freq: Four times a day (QID) | ORAL | Status: DC | PRN
  Administered 2019-01-07 (×2): 650 mg via ORAL
  Filled 2019-01-07 (×2): qty 2

## 2019-01-07 NOTE — ED Notes (Signed)
This RN discussed patient situation with nursing supervisor.  Patient supervisor states patient will need to go to isolation room.

## 2019-01-07 NOTE — Progress Notes (Signed)
ANTICOAGULATION CONSULT NOTE - Initial Consult  Pharmacy Consult for heparin Indication: pulmonary embolus  Allergies  Allergen Reactions  . Amoxicillin Hives and Other (See Comments)    Has patient had a PCN reaction causing immediate rash, facial/tongue/throat swelling, SOB or lightheadedness with hypotension: No Has patient had a PCN reaction causing severe rash involving mucus membranes or skin necrosis: No Has patient had a PCN reaction that required hospitalization: No Has patient had a PCN reaction occurring within the last 10 years: Yes If all of the above answers are "NO", then may proceed with Cephalosporin use.     Patient Measurements: Height: 5' 2.5" (158.8 cm) Weight: 183 lb (83 kg) IBW/kg (Calculated) : 51.25 Heparin Dosing Weight: 70 kg  Vital Signs: Temp: 98.4 F (36.9 C) (07/16 0008) Temp Source: Oral (07/16 0008) BP: 159/95 (07/16 0615) Pulse Rate: 86 (07/16 0615)  Labs: Recent Labs    01/07/19 0020 01/07/19 0608  HGB 9.8* 9.9*  HCT 30.5* 30.7*  PLT 217 229  APTT  --  34  LABPROT  --  14.9  INR  --  1.2  CREATININE 0.88 0.74  TROPONINIHS 14 16    Estimated Creatinine Clearance: 96.3 mL/min (by C-G formula based on SCr of 0.74 mg/dL).   Medical History: Past Medical History:  Diagnosis Date  . Anemia   . Anxiety    panic attacks  . Carpal tunnel syndrome, bilateral   . Chlamydia   . Epilepsy seizure, nonconvulsive, generalized ([place])   . Headache(784.0)    migraines  . History of C-section   . Hypertension   . Infection    UTI  . Migraines   . Pregnancy induced hypertension    with first  . Seizures ([place])    No meds, off since in mid to late 20's  . UTI (urinary tract infection)     Medications:  Scheduled:  . hydrALAZINE  50 mg Oral BID  . labetalol  400 mg Oral BID  . norethindrone  5 mg Oral Daily  . pantoprazole (PROTONIX) IV  40 mg Intravenous Q24H  . potassium chloride  20 mEq Oral Once  . sodium chloride flush  3 mL  Intravenous Q12H    Assessment: Patient admitted for palpitations and HTN, found to have non-occlusive PE on CT. Patient is not on any anticoagulation PTA, but is however, on norethindrone, spoke to hospitalist to d/c this for now. Patient is being started on heparin drip for anticoagulation of PE.  Goal of Therapy:  Heparin level 0.3-0.7 units/ml Monitor platelets by anticoagulation protocol: Yes   Plan:  Will bolus patient w/ heparin 5000 units IV x 1 Will start rate at 1150 units/hr  Baseline labs WNL w/ exception of low h/h. Will check anti-Xa at 1100. Will monitor daily CBC's and adjust per anti-Xa levels.  Tobie Lords, PharmD, BCPS Clinical Pharmacist 01/07/2019,7:22 AM

## 2019-01-07 NOTE — ED Notes (Signed)
Pt resting quietly with eyes closed at this time, pt recently up to the bathroom, denies any needs at this time.

## 2019-01-07 NOTE — Consult Note (Signed)
Mary Roberts is a 38 y.o. female  [id number]  Primary Cardiologist: New patient to Dr. Neoma Laming Reason for Consultation: HTN, palpitations.  HPI: 68 female with a past medical history listed below presents to ED with HTN and palpitations. She reports her blood pressures have reached over 967 systolic intermittently over the past few months. She has dyspnea associated with deep inspiration and palpitations.   Review of Systems: No chest pain or shortness of breath, but reports a headache. She reports she is anxious about the number of blood draws she has had.    Past Medical History:  Diagnosis Date  . Anemia   . Anxiety    panic attacks  . Carpal tunnel syndrome, bilateral   . Chlamydia   . Epilepsy seizure, nonconvulsive, generalized ([place])   . Headache(784.0)    migraines  . History of C-section   . Hypertension   . Infection    UTI  . Migraines   . Pregnancy induced hypertension    with first  . Seizures ([place])    No meds, off since in mid to late 20's  . UTI (urinary tract infection)     Medications Prior to Admission  Medication Sig Dispense Refill  . hydrALAZINE (APRESOLINE) 50 MG tablet Take 1 tablet (50 mg total) by mouth 2 (two) times daily. 180 tablet 1  . labetalol (NORMODYNE) 200 MG tablet Take 2 tablets (400 mg total) by mouth 2 (two) times daily. 360 tablet 1  . norethindrone (AYGESTIN) 5 MG tablet Take 1 tablet (5 mg total) by mouth daily. 30 tablet 11     . hydrALAZINE  50 mg Oral BID  . labetalol  400 mg Oral BID  . pantoprazole (PROTONIX) IV  40 mg Intravenous Q24H  . sodium chloride flush  3 mL Intravenous Q12H    Infusions: . sodium chloride    . heparin 1,150 Units/hr (01/07/19 0800)    Allergies  Allergen Reactions  . Amoxicillin Hives and Other (See Comments)    Has patient had a PCN reaction causing immediate rash, facial/tongue/throat swelling, SOB or lightheadedness with hypotension: No Has patient had a PCN reaction causing  severe rash involving mucus membranes or skin necrosis: No Has patient had a PCN reaction that required hospitalization: No Has patient had a PCN reaction occurring within the last 10 years: Yes If all of the above answers are "NO", then may proceed with Cephalosporin use.     Social History   Socioeconomic History  . Marital status: Single    Spouse name: Not on file  . Number of children: Not on file  . Years of education: Not on file  . Highest education level: Not on file  Occupational History  . Not on file  Social Needs  . Financial resource strain: Not on file  . Food insecurity    Worry: Not on file    Inability: Not on file  . Transportation needs    Medical: Not on file    Non-medical: Not on file  Tobacco Use  . Smoking status: Former Research scientist (life sciences)  . Smokeless tobacco: Never Used  Substance and Sexual Activity  . Alcohol use: Yes    Comment: socially   . Drug use: No  . Sexual activity: Yes    Birth control/protection: Surgical  Lifestyle  . Physical activity    Days per week: Not on file    Minutes per session: Not on file  . Stress: Not on file  Relationships  .  Social Herbalist on phone: Not on file    Gets together: Not on file    Attends religious service: Not on file    Active member of club or organization: Not on file    Attends meetings of clubs or organizations: Not on file    Relationship status: Not on file  . Intimate partner violence    Fear of current or ex partner: Not on file    Emotionally abused: Not on file    Physically abused: Not on file    Forced sexual activity: Not on file  Other Topics Concern  . Not on file  Social History Narrative  . Not on file    Family History  Problem Relation Age of Onset  . Seizures Mother   . Hypertension Mother   . Diabetes Mother   . Heart disease Mother   . Stroke Mother   . Arthritis Mother   . Seizures Brother   . Hearing loss Neg Hx     PHYSICAL EXAM: Vitals:   01/07/19  0730 01/07/19 0745  BP: (!) 150/91 (!) 165/103  Pulse: 85 79  Resp: 12 16  Temp:    SpO2: 99% 98%     Intake/Output Summary (Last 24 hours) at 01/07/2019 0915 Last data filed at 01/07/2019 0800 Gross per 24 hour  Intake 69.21 ml  Output -  Net 69.21 ml    General:  Well appearing. No respiratory difficulty HEENT: normal Neck: supple. no JVD. Carotids 2+ bilat; no bruits. No lymphadenopathy or thryomegaly appreciated. Cor: PMI nondisplaced. Regular rate & rhythm. No rubs, gallops or murmurs. Lungs: clear Abdomen: soft, nontender, nondistended. No hepatosplenomegaly. No bruits or masses. Good bowel sounds. Extremities: no cyanosis, clubbing, rash, edema Neuro: alert & oriented x 3, cranial nerves grossly intact. moves all 4 extremities w/o difficulty. Affect pleasant.  ECG: Normal sinus rhythm 79bpm Possible Left atrial enlargement Anterior infarct , age undetermined   Results for orders placed or performed during the hospital encounter of 01/07/19 (from the past 24 hour(s))  CBC with Differential     Status: Abnormal   Collection Time: 01/07/19 12:20 AM  Result Value Ref Range   WBC 5.7 4.0 - 10.5 K/uL   RBC 3.43 (L) 3.87 - 5.11 MIL/uL   Hemoglobin 9.8 (L) 12.0 - 15.0 g/dL   HCT 30.5 (L) 36.0 - 46.0 %   MCV 88.9 80.0 - 100.0 fL   MCH 28.6 26.0 - 34.0 pg   MCHC 32.1 30.0 - 36.0 g/dL   RDW 14.9 11.5 - 15.5 %   Platelets 217 150 - 400 K/uL   nRBC 0.0 0.0 - 0.2 %   Neutrophils Relative % 65 %   Neutro Abs 3.6 1.7 - 7.7 K/uL   Lymphocytes Relative 24 %   Lymphs Abs 1.4 0.7 - 4.0 K/uL   Monocytes Relative 10 %   Monocytes Absolute 0.6 0.1 - 1.0 K/uL   Eosinophils Relative 1 %   Eosinophils Absolute 0.1 0.0 - 0.5 K/uL   Basophils Relative 0 %   Basophils Absolute 0.0 0.0 - 0.1 K/uL   Immature Granulocytes 0 %   Abs Immature Granulocytes 0.02 0.00 - 0.07 K/uL  Comprehensive metabolic panel     Status: Abnormal   Collection Time: 01/07/19 12:20 AM  Result Value Ref  Range   Sodium 137 135 - 145 mmol/L   Potassium 3.4 (L) 3.5 - 5.1 mmol/L   Chloride 104 98 - 111 mmol/L  CO2 25 22 - 32 mmol/L   Glucose, Bld 98 70 - 99 mg/dL   BUN 13 6 - 20 mg/dL   Creatinine, Ser 0.88 0.44 - 1.00 mg/dL   Calcium 9.0 8.9 - 10.3 mg/dL   Total Protein 7.5 6.5 - 8.1 g/dL   Albumin 3.9 3.5 - 5.0 g/dL   AST 16 15 - 41 U/L   ALT 12 0 - 44 U/L   Alkaline Phosphatase 37 (L) 38 - 126 U/L   Total Bilirubin 0.7 0.3 - 1.2 mg/dL   GFR calc non Af Amer >60 >60 mL/min   GFR calc Af Amer >60 >60 mL/min   Anion gap 8 5 - 15  Troponin I (High Sensitivity)     Status: None   Collection Time: 01/07/19 12:20 AM  Result Value Ref Range   Troponin I (High Sensitivity) 14 <18 ng/L  Troponin I (High Sensitivity)     Status: None   Collection Time: 01/07/19  6:08 AM  Result Value Ref Range   Troponin I (High Sensitivity) 16 <18 ng/L  TSH     Status: None   Collection Time: 01/07/19  6:08 AM  Result Value Ref Range   TSH 1.693 0.350 - 4.500 uIU/mL  Basic metabolic panel     Status: Abnormal   Collection Time: 01/07/19  6:08 AM  Result Value Ref Range   Sodium 135 135 - 145 mmol/L   Potassium 3.2 (L) 3.5 - 5.1 mmol/L   Chloride 102 98 - 111 mmol/L   CO2 24 22 - 32 mmol/L   Glucose, Bld 92 70 - 99 mg/dL   BUN 9 6 - 20 mg/dL   Creatinine, Ser 0.74 0.44 - 1.00 mg/dL   Calcium 8.8 (L) 8.9 - 10.3 mg/dL   GFR calc non Af Amer >60 >60 mL/min   GFR calc Af Amer >60 >60 mL/min   Anion gap 9 5 - 15  CBC     Status: Abnormal   Collection Time: 01/07/19  6:08 AM  Result Value Ref Range   WBC 4.8 4.0 - 10.5 K/uL   RBC 3.42 (L) 3.87 - 5.11 MIL/uL   Hemoglobin 9.9 (L) 12.0 - 15.0 g/dL   HCT 30.7 (L) 36.0 - 46.0 %   MCV 89.8 80.0 - 100.0 fL   MCH 28.9 26.0 - 34.0 pg   MCHC 32.2 30.0 - 36.0 g/dL   RDW 14.8 11.5 - 15.5 %   Platelets 229 150 - 400 K/uL   nRBC 0.0 0.0 - 0.2 %  APTT     Status: None   Collection Time: 01/07/19  6:08 AM  Result Value Ref Range   aPTT 34 24 - 36 seconds   Protime-INR     Status: None   Collection Time: 01/07/19  6:08 AM  Result Value Ref Range   Prothrombin Time 14.9 11.4 - 15.2 seconds   INR 1.2 0.8 - 1.2   Ct Angio Chest Pe W And/or Wo Contrast  Result Date: 01/07/2019 CLINICAL DATA:  Chest pain EXAM: CT ANGIOGRAPHY CHEST WITH CONTRAST TECHNIQUE: Multidetector CT imaging of the chest was performed using the standard protocol during bolus administration of intravenous contrast. Multiplanar CT image reconstructions and MIPs were obtained to evaluate the vascular anatomy. CONTRAST:  83 mL ISOVUE-370 IOPAMIDOL (ISOVUE-370) INJECTION 76% COMPARISON:  Chest x-ray 07/14/2016 FINDINGS: Cardiovascular: Satisfactory opacification of the pulmonary arteries to the segmental level. Probable small nonocclusive filling defect/embolus within right upper lobe subsegmental branch vessel though vessels in  this region are slightly obscured by streak artifact from adjacent dense contrast in the SVC. No other discrete filling defects are visualized. Aorta is nonaneurysmal. The heart size is slightly enlarged. There is no pericardial effusion. Mediastinum/Nodes: Mildly prominent axillary lymph nodes. Midline trachea. No thyroid mass. Esophagus within normal limits. Lungs/Pleura: Lungs are clear. No pleural effusion or pneumothorax. Upper Abdomen: No acute abnormality. Musculoskeletal: No chest wall abnormality. No acute or significant osseous findings. Review of the MIP images confirms the above findings. IMPRESSION: 1. Probable small nonocclusive embolus within subsegmental right upper lobe branch vessel though evaluation of vessels in the region is limited by artifact arising from dense contrast within the SVC. There are no other suggested filling defects. 2. Clear lung fields Electronically Signed   By: Donavan Foil M.D.   On: 01/07/2019 02:59     ASSESSMENT AND PLAN:   Hypertension: Remains markedly elevated with labetalol and hydralazine. Increase hydralazine to  50mg  TID, add 25mg  chlorthalidone, monitor response. Echo and renal artery duplex scan ordered.   Pulmonary embolus: Small, nonocclusive. Continue heparin drip.  Possible CAD: Negative troponin, no acute changes on EKG. Advise outpatient stress test.  Patients states she had already had a cardiology appointment scheduled to establish care for 01/14/19 with Dr. Christen Bame, NP-C Cell: [phone]

## 2019-01-07 NOTE — ED Notes (Signed)
ED TO INPATIENT HANDOFF REPORT  ED Nurse Name and Phone #: Anda Kraft [phone]  S Name/Age/Gender Mary Roberts 38 y.o. female Room/Bed: ED13A/ED13A  Code Status   Code Status: Full Code  Home/SNF/Other Home Patient oriented to: self, place, time and situation Is this baseline? Yes   Triage Complete: Triage complete  Chief Complaint High blood Pressure 212/108 20 minutes ago, Chest skipping beats  Triage Note Patient c/o hypertension systolic BP > 035 for several months and palpitations for several days. Patient just saw PCP and referred to cardiology, but wanted to be evaluated sooner due to new onset palpitations.    Allergies Allergies  Allergen Reactions  . Amoxicillin Hives and Other (See Comments)    Has patient had a PCN reaction causing immediate rash, facial/tongue/throat swelling, SOB or lightheadedness with hypotension: No Has patient had a PCN reaction causing severe rash involving mucus membranes or skin necrosis: No Has patient had a PCN reaction that required hospitalization: No Has patient had a PCN reaction occurring within the last 10 years: Yes If all of the above answers are "NO", then may proceed with Cephalosporin use.     Level of Care/Admitting Diagnosis ED Disposition    ED Disposition Condition [facility] Area: [place] [[id number]]  Level of Care: Telemetry [5]  Covid Evaluation: Asymptomatic Screening Protocol (No Symptoms)  Diagnosis: Hypertensive urgency [009381]  Admitting Physician: Mayer Camel [[id number]]  Attending Physician: Mayer Camel [[id number]]  Estimated length of stay: past midnight tomorrow  Certification:: I certify this patient will need inpatient services for at least 2 midnights  PT Class (Do Not Modify): Inpatient [101]  PT Acc Code (Do Not Modify): Private [1]       B Medical/Surgery History Past Medical History:  Diagnosis Date  . Anemia   . Anxiety    panic attacks  .  Carpal tunnel syndrome, bilateral   . Chlamydia   . Epilepsy seizure, nonconvulsive, generalized ([place])   . Headache(784.0)    migraines  . History of C-section   . Hypertension   . Infection    UTI  . Migraines   . Pregnancy induced hypertension    with first  . Seizures ([place])    No meds, off since in mid to late 20's  . UTI (urinary tract infection)    Past Surgical History:  Procedure Laterality Date  . CESAREAN SECTION    . MOUTH SURGERY    . TUBAL LIGATION N/A 10/30/2013   Procedure: POST PARTUM TUBAL LIGATION;  Surgeon: [name], MD;  Location: [facility];  Service: Gynecology;  Laterality: N/A;     A IV Location/Drains/Wounds Patient Lines/Drains/Airways Status   Active Line/Drains/Airways    Name:   Placement date:   Placement time:   Site:   Days:   Peripheral IV 01/07/19 Left;Right Antecubital   01/07/19    0043    Antecubital   less than 1          Intake/Output Last 24 hours No intake or output data in the 24 hours ending 01/07/19 0520  Labs/Imaging Results for orders placed or performed during the hospital encounter of 01/07/19 (from the past 48 hour(s))  CBC with Differential     Status: Abnormal   Collection Time: 01/07/19 12:20 AM  Result Value Ref Range   WBC 5.7 4.0 - 10.5 K/uL   RBC 3.43 (L) 3.87 - 5.11 MIL/uL   Hemoglobin 9.8 (L) 12.0 - 15.0 g/dL  HCT 30.5 (L) 36.0 - 46.0 %   MCV 88.9 80.0 - 100.0 fL   MCH 28.6 26.0 - 34.0 pg   MCHC 32.1 30.0 - 36.0 g/dL   RDW 14.9 11.5 - 15.5 %   Platelets 217 150 - 400 K/uL   nRBC 0.0 0.0 - 0.2 %   Neutrophils Relative % 65 %   Neutro Abs 3.6 1.7 - 7.7 K/uL   Lymphocytes Relative 24 %   Lymphs Abs 1.4 0.7 - 4.0 K/uL   Monocytes Relative 10 %   Monocytes Absolute 0.6 0.1 - 1.0 K/uL   Eosinophils Relative 1 %   Eosinophils Absolute 0.1 0.0 - 0.5 K/uL   Basophils Relative 0 %   Basophils Absolute 0.0 0.0 - 0.1 K/uL   Immature Granulocytes 0 %   Abs Immature Granulocytes 0.02 0.00 - 0.07 K/uL     Comment: Performed at [facility], [address] [place], [place] [address]  Comprehensive metabolic panel     Status: Abnormal   Collection Time: 01/07/19 12:20 AM  Result Value Ref Range   Sodium 137 135 - 145 mmol/L   Potassium 3.4 (L) 3.5 - 5.1 mmol/L   Chloride 104 98 - 111 mmol/L   CO2 25 22 - 32 mmol/L   Glucose, Bld 98 70 - 99 mg/dL   BUN 13 6 - 20 mg/dL   Creatinine, Ser 0.88 0.44 - 1.00 mg/dL   Calcium 9.0 8.9 - 10.3 mg/dL   Total Protein 7.5 6.5 - 8.1 g/dL   Albumin 3.9 3.5 - 5.0 g/dL   AST 16 15 - 41 U/L   ALT 12 0 - 44 U/L   Alkaline Phosphatase 37 (L) 38 - 126 U/L   Total Bilirubin 0.7 0.3 - 1.2 mg/dL   GFR calc non Af Amer >60 >60 mL/min   GFR calc Af Amer >60 >60 mL/min   Anion gap 8 5 - 15    Comment: Performed at [facility], [place], [place] [address]  Troponin I (High Sensitivity)     Status: None   Collection Time: 01/07/19 12:20 AM  Result Value Ref Range   Troponin I (High Sensitivity) 14 <18 ng/L    Comment: (NOTE) Elevated high sensitivity troponin I (hsTnI) values and significant  changes across serial measurements may suggest ACS but many other  chronic and acute conditions are known to elevate hsTnI results.  Refer to the "Links" section for chest pain algorithms and additional  guidance. Performed at [facility], [place], [place] [address]    Ct Angio Chest Pe W And/or Wo Contrast  Result Date: 01/07/2019 CLINICAL DATA:  Chest pain EXAM: CT ANGIOGRAPHY CHEST WITH CONTRAST TECHNIQUE: Multidetector CT imaging of the chest was performed using the standard protocol during bolus administration of intravenous contrast. Multiplanar CT image reconstructions and MIPs were obtained to evaluate the vascular anatomy. CONTRAST:  83 mL ISOVUE-370 IOPAMIDOL (ISOVUE-370) INJECTION 76% COMPARISON:  Chest x-ray 07/14/2016 FINDINGS: Cardiovascular: Satisfactory opacification of the pulmonary arteries to  the segmental level. Probable small nonocclusive filling defect/embolus within right upper lobe subsegmental branch vessel though vessels in this region are slightly obscured by streak artifact from adjacent dense contrast in the SVC. No other discrete filling defects are visualized. Aorta is nonaneurysmal. The heart size is slightly enlarged. There is no pericardial effusion. Mediastinum/Nodes: Mildly prominent axillary lymph nodes. Midline trachea. No thyroid mass. Esophagus within normal limits. Lungs/Pleura: Lungs are clear. No pleural effusion  or pneumothorax. Upper Abdomen: No acute abnormality. Musculoskeletal: No chest wall abnormality. No acute or significant osseous findings. Review of the MIP images confirms the above findings. IMPRESSION: 1. Probable small nonocclusive embolus within subsegmental right upper lobe branch vessel though evaluation of vessels in the region is limited by artifact arising from dense contrast within the SVC. There are no other suggested filling defects. 2. Clear lung fields Electronically Signed   By: Donavan Foil M.D.   On: 01/07/2019 02:59    Pending Labs Unresulted Labs (From admission, onward)    Start     Ordered   01/07/19 0515  TSH  Once,   STAT     01/07/19 0514   01/07/19 7517  Basic metabolic panel  Tomorrow morning,   STAT     01/07/19 0514   01/07/19 0515  CBC  Tomorrow morning,   STAT     01/07/19 0514          Vitals/Pain Today's Vitals   01/07/19 0430 01/07/19 0446 01/07/19 0500 01/07/19 0515  BP: (!) 195/111 (!) 188/110 (!) 182/107 (!) 180/111  Pulse: 78 71 69 72  Resp: 19 16 16 12   Temp:      TempSrc:      SpO2: 99% 97% 99% 100%  Weight:      Height:      PainSc:        Isolation Precautions No active isolations  Medications Medications  hydrALAZINE (APRESOLINE) tablet 50 mg (has no administration in time range)  labetalol (NORMODYNE) tablet 400 mg (has no administration in time range)  norethindrone (AYGESTIN) tablet 5 mg  (has no administration in time range)  sodium chloride flush (NS) 0.9 % injection 3 mL (has no administration in time range)  sodium chloride flush (NS) 0.9 % injection 3 mL (has no administration in time range)  sodium chloride flush (NS) 0.9 % injection 3 mL (has no administration in time range)  0.9 %  sodium chloride infusion (has no administration in time range)  acetaminophen (TYLENOL) tablet 650 mg (has no administration in time range)    Or  acetaminophen (TYLENOL) suppository 650 mg (has no administration in time range)  polyethylene glycol (MIRALAX / GLYCOLAX) packet 17 g (has no administration in time range)  ondansetron (ZOFRAN) tablet 4 mg (has no administration in time range)    Or  ondansetron (ZOFRAN) injection 4 mg (has no administration in time range)  potassium chloride (KLOR-CON) packet 20 mEq (has no administration in time range)  hydrALAZINE (APRESOLINE) injection 10 mg (10 mg Intravenous Given 01/07/19 0517)  pantoprazole (PROTONIX) injection 40 mg (has no administration in time range)  labetalol (NORMODYNE) injection 20 mg (10 mg Intravenous Given 01/07/19 0322)  iopamidol (ISOVUE-370) 76 % injection 100 mL (100 mLs Intravenous Contrast Given 01/07/19 0219)  nitroGLYCERIN (NITROGLYN) 2 % ointment 1 inch (1 inch Topical Given 01/07/19 0359)    Mobility walks Low fall risk   Focused Assessments Cardiac Assessment Handoff:  Cardiac Rhythm: Normal sinus rhythm Lab Results  Component Value Date   TROPONINI <0.03 07/08/2018   Lab Results  Component Value Date   DDIMER  03/08/2010    <0.22        AT THE INHOUSE ESTABLISHED CUTOFF VALUE OF 0.48 ug/mL FEU, THIS ASSAY HAS BEEN DOCUMENTED IN THE LITERATURE TO HAVE A SENSITIVITY AND NEGATIVE PREDICTIVE VALUE OF AT LEAST 98 TO 99%.  THE TEST RESULT SHOULD BE CORRELATED WITH AN ASSESSMENT OF THE CLINICAL PROBABILITY OF DVT /  VTE.   Does the Patient currently have chest pain? No     R Recommendations: See  Admitting Provider Note  Report given to:   Additional Notes:

## 2019-01-07 NOTE — ED Notes (Signed)
Paged admitting provider to ask about COVID test

## 2019-01-07 NOTE — Progress Notes (Signed)
ANTICOAGULATION CONSULT NOTE - Initial Consult  Pharmacy Consult for heparin Indication: pulmonary embolus  Allergies  Allergen Reactions  . Amoxicillin Hives and Other (See Comments)    Has patient had a PCN reaction causing immediate rash, facial/tongue/throat swelling, SOB or lightheadedness with hypotension: No Has patient had a PCN reaction causing severe rash involving mucus membranes or skin necrosis: No Has patient had a PCN reaction that required hospitalization: No Has patient had a PCN reaction occurring within the last 10 years: Yes If all of the above answers are "NO", then may proceed with Cephalosporin use.     Patient Measurements: Height: 5' 2.5" (158.8 cm) Weight: 187 lb 12.8 oz (85.2 kg) IBW/kg (Calculated) : 51.25 Heparin Dosing Weight: 70 kg  Vital Signs: Temp: 98.4 F (36.9 C) (07/16 0917) Temp Source: Oral (07/16 0917) BP: 179/109 (07/16 0919) Pulse Rate: 76 (07/16 0919)  Labs: Recent Labs    01/07/19 0020 01/07/19 0608 01/07/19 1154  HGB 9.8* 9.9*  --   HCT 30.5* 30.7*  --   PLT 217 229  --   APTT  --  34  --   LABPROT  --  14.9  --   INR  --  1.2  --   HEPARINUNFRC  --   --  0.33  CREATININE 0.88 0.74  --   TROPONINIHS 14 16  --     Estimated Creatinine Clearance: 97.7 mL/min (by C-G formula based on SCr of 0.74 mg/dL).   Medical History: Past Medical History:  Diagnosis Date  . Anemia   . Anxiety    panic attacks  . Carpal tunnel syndrome, bilateral   . Chlamydia   . Epilepsy seizure, nonconvulsive, generalized ([place])   . Headache(784.0)    migraines  . History of C-section   . Hypertension   . Infection    UTI  . Migraines   . Pregnancy induced hypertension    with first  . Seizures ([place])    No meds, off since in mid to late 20's  . UTI (urinary tract infection)     Medications:  Scheduled:  . chlorthalidone  25 mg Oral Daily  . hydrALAZINE  50 mg Oral Q8H  . labetalol  400 mg Oral BID  . pantoprazole (PROTONIX) IV   40 mg Intravenous Q24H  . sodium chloride flush  3 mL Intravenous Q12H    Assessment: Patient admitted for palpitations and HTN, found to have non-occlusive PE on CT. Patient is not on any anticoagulation PTA, but is however, on norethindrone, spoke to hospitalist to d/c this for now. Patient is being started on heparin drip for anticoagulation of PE.  Heparin 5000 unit bolus followed by 1150 units/hr infusion  7/16 1154 HL 0.33   Goal of Therapy:  Heparin level 0.3-0.7 units/ml Monitor platelets by anticoagulation protocol: Yes   Plan:  Heparin level therapeutic.  Will continue rate at 1150 units/hr  Baseline labs WNL CBC stable.  Will check anti-Xa at 1800. Will monitor daily CBC's and adjust per anti-Xa levels.  Eleonore Chiquito, PharmD, BCPS Clinical Pharmacist 01/07/2019,12:32 PM

## 2019-01-07 NOTE — ED Triage Notes (Signed)
Patient c/o hypertension systolic BP > 409 for several months and palpitations for several days. Patient just saw PCP and referred to cardiology, but wanted to be evaluated sooner due to new onset palpitations.

## 2019-01-07 NOTE — ED Notes (Signed)
Dr. Sidney Ace in room to speak to patient about refusing COVID swab

## 2019-01-07 NOTE — ED Provider Notes (Signed)
[facility] Emergency Department Provider Note    First MD Initiated Contact with Patient 01/07/19 [id number]     (approximate)  I have reviewed the triage vital signs and the nursing notes.   HISTORY  Chief Complaint Palpitations and Hypertension   HPI Mary Roberts is a 38 y.o. female with below list of previous medical conditions presents emergency department with hypertension and palpitation.  Patient states that she has had blood pressures exceeding 200 over the past several months however she states that she has had palpitations periodically over the past several days with chest pain noted yesterday in conjunction with the palpitation.  Patient states that episodes are brief in regards to the palpitation however she does have dyspnea associated with them.  Patient admits to discomfort with deep inspiration as well.       Past Medical History:  Diagnosis Date   Anemia    Anxiety    panic attacks   Carpal tunnel syndrome, bilateral    Chlamydia    Epilepsy seizure, nonconvulsive, generalized ([place])    Headache(784.0)    migraines   History of C-section    Hypertension    Infection    UTI   Migraines    Pregnancy induced hypertension    with first   Seizures ([place])    No meds, off since in mid to late 20's   UTI (urinary tract infection)     Patient Active Problem List   Diagnosis Date Noted   Hypertensive urgency 01/07/2019   Menorrhagia with regular cycle 07/15/2018   Anemia associated with acute blood loss 07/15/2018   Uncontrolled hypertension 02/01/2018   Anemia due to chronic blood loss 07/14/2016   Bilateral pneumonia 07/14/2016   HTN (hypertension) 07/14/2016   Acute hypokalemia 07/14/2016   Heavy menstrual bleeding 07/14/2016   Intertrochanteric fracture of left femur ([place]) 07/14/2016   Carpal tunnel syndrome, bilateral    Pre-eclampsia, delivered 10/29/2013   History of seizures/last episode >15 yrs  ago 10/28/2013   History of Severe pre-eclampsia 10/28/2013   Previous cesarean delivery, antepartum condition or complication 25/42/7062   Severe Preeclampsia superimposed on pre-existing hypertension, antepartum 04/29/2013   Supervision of high risk pregnancy in second trimester 04/29/2013    Past Surgical History:  Procedure Laterality Date   CESAREAN SECTION     MOUTH SURGERY     TUBAL LIGATION N/A 10/30/2013   Procedure: POST PARTUM TUBAL LIGATION;  Surgeon: [name], MD;  Location: [facility];  Service: Gynecology;  Laterality: N/A;    Prior to Admission medications   Medication Sig Start Date End Date Taking? Authorizing Provider  hydrALAZINE (APRESOLINE) 50 MG tablet Take 1 tablet (50 mg total) by mouth 2 (two) times daily. 10/13/18  Yes Guse, Jacquelynn Cree, FNP  labetalol (NORMODYNE) 200 MG tablet Take 2 tablets (400 mg total) by mouth 2 (two) times daily. 10/13/18 01/11/19 Yes Guse, Jacquelynn Cree, FNP  norethindrone (AYGESTIN) 5 MG tablet Take 1 tablet (5 mg total) by mouth daily. 07/15/18  Yes Will Bonnet, MD    Allergies Amoxicillin  Family History  Problem Relation Age of Onset   Seizures Mother    Hypertension Mother    Diabetes Mother    Heart disease Mother    Stroke Mother    Arthritis Mother    Seizures Brother    Hearing loss Neg Hx     Social History Social History   Tobacco Use   Smoking status: Former Smoker  Smokeless tobacco: Never Used  Substance Use Topics   Alcohol use: Yes    Comment: socially    Drug use: No    Review of Systems Constitutional: No fever/chills Eyes: No visual changes. ENT: No sore throat. Cardiovascular: Positive for chest pain. Respiratory: Positive for shortness of breath. Gastrointestinal: No abdominal pain.  No nausea, no vomiting.  No diarrhea.  No constipation. Genitourinary: Negative for dysuria. Musculoskeletal: Negative for neck pain.  Negative for back pain. Integumentary: Negative for  rash. Neurological: Negative for headaches, focal weakness or numbness.  ____________________________________________   PHYSICAL EXAM:  VITAL SIGNS: ED Triage Vitals  Enc Vitals Group     BP 01/07/19 0008 (!) 221/118     Pulse Rate 01/07/19 0008 77     Resp 01/07/19 0008 20     Temp 01/07/19 0008 98.4 F (36.9 C)     Temp Source 01/07/19 0008 Oral     SpO2 01/07/19 0008 100 %     Weight 01/07/19 0009 83 kg (183 lb)     Height 01/07/19 0009 1.588 m (5' 2.5")     Head Circumference --      Peak Flow --      Pain Score 01/07/19 0015 0     Pain Loc --      Pain Edu? --      Excl. in [place]? --     Constitutional: Alert and oriented. Well appearing and in no acute distress. Eyes: Conjunctivae are normal.  Mouth/Throat: Mucous membranes are moist.  Oropharynx non-erythematous. Neck: No stridor.   Cardiovascular: Normal rate, regular rhythm. Good peripheral circulation. Grossly normal heart sounds. Respiratory: Normal respiratory effort.  No retractions. No audible wheezing. Gastrointestinal: Soft and nontender. No distention.  Musculoskeletal: No lower extremity tenderness nor edema. No gross deformities of extremities. Neurologic:  Normal speech and language. No gross focal neurologic deficits are appreciated.  Skin:  Skin is warm, dry and intact. No rash noted. Psychiatric: Mood and affect are normal. Speech and behavior are normal.  ____________________________________________   LABS (all labs ordered are listed, but only abnormal results are displayed)  Labs Reviewed  CBC WITH DIFFERENTIAL/PLATELET - Abnormal; Notable for the following components:      Result Value   RBC 3.43 (*)    Hemoglobin 9.8 (*)    HCT 30.5 (*)    All other components within normal limits  COMPREHENSIVE METABOLIC PANEL - Abnormal; Notable for the following components:   Potassium 3.4 (*)    Alkaline Phosphatase 37 (*)    All other components within normal limits  SARS CORONAVIRUS 2 (HOSPITAL  ORDER, [place] [facility])  TSH  BASIC METABOLIC PANEL  CBC  APTT  PROTIME-INR  HEPARIN LEVEL (UNFRACTIONATED)  TROPONIN I (HIGH SENSITIVITY)  TROPONIN I (HIGH SENSITIVITY)   ____________________________________________  EKG  ED ECG REPORT I, [place] N Larnie Heart, the attending physician, personally viewed and interpreted this ECG.   Date: 01/07/2019  EKG Time: 12:15 AM  Rate: 79  Rhythm: Normal sinus rhythm  Axis: Normal  Intervals: Normal  ST&T Change: None  ____________________________________________  RADIOLOGY I, [place] N Walsie Smeltz, personally viewed and evaluated these images (plain radiographs) as part of my medical decision making, as well as reviewing the written report by the radiologist.  ED MD interpretation: Probable small nonocclusive embolus within a segment segmental right upper lobe branch vessel per radiologist on CT angiogram of the chest interpretation.  Official radiology report(s): Ct Angio Chest Pe W And/or Wo Contrast  Result Date: 01/07/2019 CLINICAL DATA:  Chest pain EXAM: CT ANGIOGRAPHY CHEST WITH CONTRAST TECHNIQUE: Multidetector CT imaging of the chest was performed using the standard protocol during bolus administration of intravenous contrast. Multiplanar CT image reconstructions and MIPs were obtained to evaluate the vascular anatomy. CONTRAST:  83 mL ISOVUE-370 IOPAMIDOL (ISOVUE-370) INJECTION 76% COMPARISON:  Chest x-ray 07/14/2016 FINDINGS: Cardiovascular: Satisfactory opacification of the pulmonary arteries to the segmental level. Probable small nonocclusive filling defect/embolus within right upper lobe subsegmental branch vessel though vessels in this region are slightly obscured by streak artifact from adjacent dense contrast in the SVC. No other discrete filling defects are visualized. Aorta is nonaneurysmal. The heart size is slightly enlarged. There is no pericardial effusion. Mediastinum/Nodes: Mildly prominent axillary lymph  nodes. Midline trachea. No thyroid mass. Esophagus within normal limits. Lungs/Pleura: Lungs are clear. No pleural effusion or pneumothorax. Upper Abdomen: No acute abnormality. Musculoskeletal: No chest wall abnormality. No acute or significant osseous findings. Review of the MIP images confirms the above findings. IMPRESSION: 1. Probable small nonocclusive embolus within subsegmental right upper lobe branch vessel though evaluation of vessels in the region is limited by artifact arising from dense contrast within the SVC. There are no other suggested filling defects. 2. Clear lung fields Electronically Signed   By: Donavan Foil M.D.   On: 01/07/2019 02:59    ____________________________________________   PROCEDURES   .Critical Care Performed by: Gregor Hams, MD Authorized by: Gregor Hams, MD   Critical care provider statement:    Critical care time (minutes):  30   Critical care time was exclusive of:  Separately billable procedures and treating other patients (Hypertensive urgency)   Critical care was time spent personally by me on the following activities:  Development of treatment plan with patient or surrogate, discussions with consultants, evaluation of patient's response to treatment, examination of patient, obtaining history from patient or surrogate, ordering and performing treatments and interventions, ordering and review of laboratory studies, ordering and review of radiographic studies, pulse oximetry, re-evaluation of patient's condition and review of old charts     ____________________________________________   INITIAL IMPRESSION / MDM / [place] / ED COURSE  As part of my medical decision making, I reviewed the following data within the electronic MEDICAL RECORD NUMBER  38 year old female presenting with above-stated history and physical exam secondary to markedly elevated blood pressure chest pain and shortness of breath.  Concern for possible  hypertensive urgency/emergency as well as pulmonary emboli given reported history.  Patient given labetalol 20 mg IV with brief improvement of systolic blood pressure to 196 however patient subsequently returned to 224.  Nitropaste 1 inch applied.  Patient also noted to have a small subsegmental nonocclusive thrombus on the right for which patient will receive heparin.  Patient discussed with [place] NP hospitalist staff for hospital admission for further evaluation and management       ____________________________________________  FINAL CLINICAL IMPRESSION(S) / ED DIAGNOSES  Final diagnoses:  Hypertensive urgency  Single subsegmental pulmonary embolism without acute cor pulmonale     MEDICATIONS GIVEN DURING THIS VISIT:  Medications  hydrALAZINE (APRESOLINE) tablet 50 mg (has no administration in time range)  labetalol (NORMODYNE) tablet 400 mg (has no administration in time range)  norethindrone (AYGESTIN) tablet 5 mg (has no administration in time range)  sodium chloride flush (NS) 0.9 % injection 3 mL (has no administration in time range)  sodium chloride flush (NS) 0.9 % injection 3 mL (has no administration in time range)  0.9 %  sodium chloride infusion (has no administration in time range)  acetaminophen (TYLENOL) tablet 650 mg (has no administration in time range)    Or  acetaminophen (TYLENOL) suppository 650 mg (has no administration in time range)  polyethylene glycol (MIRALAX / GLYCOLAX) packet 17 g (has no administration in time range)  ondansetron (ZOFRAN) tablet 4 mg (has no administration in time range)    Or  ondansetron (ZOFRAN) injection 4 mg (has no administration in time range)  potassium chloride (KLOR-CON) packet 20 mEq (has no administration in time range)  hydrALAZINE (APRESOLINE) injection 10 mg (10 mg Intravenous Given 01/07/19 0517)  pantoprazole (PROTONIX) injection 40 mg (has no administration in time range)  heparin bolus via infusion 5,000 Units (has  no administration in time range)  heparin ADULT infusion 100 units/mL (25000 units/290mL sodium chloride 0.45%) (has no administration in time range)  labetalol (NORMODYNE) injection 20 mg (10 mg Intravenous Given 01/07/19 0322)  iopamidol (ISOVUE-370) 76 % injection 100 mL (100 mLs Intravenous Contrast Given 01/07/19 0219)  nitroGLYCERIN (NITROGLYN) 2 % ointment 1 inch (1 inch Topical Given 01/07/19 0359)     ED Discharge Orders    None      *Please note:  Mary Roberts was evaluated in Emergency Department on 01/07/2019 for the symptoms described in the history of present illness. She was evaluated in the context of the global COVID-19 pandemic, which necessitated consideration that the patient might be at risk for infection with the SARS-CoV-2 virus that causes COVID-19. Institutional protocols and algorithms that pertain to the evaluation of patients at risk for COVID-19 are in a state of rapid change based on information released by regulatory bodies including the CDC and federal and state organizations. These policies and algorithms were followed during the patient's care in the ED.  Some ED evaluations and interventions may be delayed as a result of limited staffing during the pandemic.*  Note:  This document was prepared using Dragon voice recognition software and may include unintentional dictation errors.   Gregor Hams, MD 01/07/19 [phone]

## 2019-01-07 NOTE — ED Notes (Signed)
Patient refusing COVID swab, will reach out to admitting provider to inform them

## 2019-01-07 NOTE — H&P (Signed)
[place] [facility] [place] NAME: Mary Roberts    MR#:  [id number]  DATE OF BIRTH:  08/14/1980  DATE OF ADMISSION:  01/07/2019  PRIMARY CARE PHYSICIAN: Jodelle Green, FNP   REQUESTING/REFERRING PHYSICIAN: Marjean Donna, MD  CHIEF COMPLAINT:   Chief Complaint  Patient presents with  . Palpitations  . Hypertension    HISTORY OF PRESENT ILLNESS:  Mary Roberts  is a 38 y.o. female with a known history of hypertension, anxiety, anemia, history of seizure disorder with no seizure in greater than 10 years.  She presented to the emergency room complaining of persistent hypertension with systolic blood pressure remaining greater than 200 for several months as well as palpitations over the last 3 days.  She denies having chest pain.  She denies shortness of breath.  She denies recent illness.  No fever, chills, nausea, vomiting, or diarrhea.  She denies abdominal pain.  Patient denies a history of pulmonary embolism or DVT.  Hypertension is treated with labetalol and hydralazine.  Patient reports excellent compliance with her medications.  Blood pressure is 220s over 120s on arrival to the emergency room.  She was treated with IV labetalol and nitroglycerin paste with blood pressure decreasing to 180s over low 100s.  High-sensitivity troponin is negative.  Potassium is 3.4.  CTA chest demonstrates probable small nonocclusive embolus with then the subsegmental right upper lobe.  No other suggested filling defects.  We have admitted her to the hospital service for further management.  PAST MEDICAL HISTORY:   Past Medical History:  Diagnosis Date  . Anemia   . Anxiety    panic attacks  . Carpal tunnel syndrome, bilateral   . Chlamydia   . Epilepsy seizure, nonconvulsive, generalized ([place])   . Headache(784.0)    migraines  . History of C-section   . Hypertension   . Infection    UTI  . Migraines   . Pregnancy induced hypertension    with first   . Seizures ([place])    No meds, off since in mid to late 20's  . UTI (urinary tract infection)     PAST SURGICAL HISTORY:   Past Surgical History:  Procedure Laterality Date  . CESAREAN SECTION    . MOUTH SURGERY    . TUBAL LIGATION N/A 10/30/2013   Procedure: POST PARTUM TUBAL LIGATION;  Surgeon: [name], MD;  Location: [facility];  Service: Gynecology;  Laterality: N/A;    SOCIAL HISTORY:   Social History   Tobacco Use  . Smoking status: Former Research scientist (life sciences)  . Smokeless tobacco: Never Used  Substance Use Topics  . Alcohol use: Yes    Comment: socially     FAMILY HISTORY:   Family History  Problem Relation Age of Onset  . Seizures Mother   . Hypertension Mother   . Diabetes Mother   . Heart disease Mother   . Stroke Mother   . Arthritis Mother   . Seizures Brother   . Hearing loss Neg Hx     DRUG ALLERGIES:   Allergies  Allergen Reactions  . Amoxicillin Hives and Other (See Comments)    Has patient had a PCN reaction causing immediate rash, facial/tongue/throat swelling, SOB or lightheadedness with hypotension: No Has patient had a PCN reaction causing severe rash involving mucus membranes or skin necrosis: No Has patient had a PCN reaction that required hospitalization: No Has patient had a PCN reaction occurring within the last 10 years: Yes If  all of the above answers are "NO", then may proceed with Cephalosporin use.     REVIEW OF SYSTEMS:   Review of Systems  Constitutional: Negative for chills, fever and malaise/fatigue.  HENT: Negative for congestion, sinus pain and sore throat.   Eyes: Negative for blurred vision, double vision and pain.  Respiratory: Negative for cough, sputum production, shortness of breath and wheezing.   Cardiovascular: Positive for palpitations. Negative for chest pain and leg swelling.  Gastrointestinal: Negative for abdominal pain, blood in stool, constipation, diarrhea, heartburn, nausea and vomiting.  Genitourinary: Negative  for dysuria, flank pain, hematuria and urgency.  Musculoskeletal: Negative for falls, joint pain, myalgias and neck pain.  Skin: Negative.  Negative for itching and rash.  Neurological: Negative for dizziness, tingling, sensory change, focal weakness, loss of consciousness, weakness and headaches.  Psychiatric/Behavioral: Negative.  Negative for depression.     MEDICATIONS AT HOME:   Prior to Admission medications   Medication Sig Start Date End Date Taking? Authorizing Provider  hydrALAZINE (APRESOLINE) 50 MG tablet Take 1 tablet (50 mg total) by mouth 2 (two) times daily. 10/13/18  Yes Guse, Jacquelynn Cree, FNP  labetalol (NORMODYNE) 200 MG tablet Take 2 tablets (400 mg total) by mouth 2 (two) times daily. 10/13/18 01/11/19 Yes Guse, Jacquelynn Cree, FNP  norethindrone (AYGESTIN) 5 MG tablet Take 1 tablet (5 mg total) by mouth daily. 07/15/18  Yes Will Bonnet, MD      VITAL SIGNS:  Blood pressure (!) 188/110, pulse 71, temperature 98.4 F (36.9 C), temperature source Oral, resp. rate 16, height 5' 2.5" (1.588 m), weight 83 kg, SpO2 97 %.  PHYSICAL EXAMINATION:  Physical Exam  GENERAL:  38 y.o.-year-old patient lying in the bed with no acute distress.  EYES: Pupils equal, round, reactive to light and accommodation. No scleral icterus. Extraocular muscles intact.  HEENT: Head atraumatic, normocephalic. Oropharynx and nasopharynx clear.  NECK:  Supple, no jugular venous distention. No thyroid enlargement, no tenderness.  LUNGS: Normal breath sounds bilaterally, no wheezing, rales,rhonchi or crepitation. No use of accessory muscles of respiration.  CARDIOVASCULAR: Regular rate and rhythm, S1, S2 normal. No murmurs, rubs, or gallops.  ABDOMEN: Soft, nondistended, nontender. Bowel sounds present. No organomegaly or mass.  EXTREMITIES: No pedal edema, cyanosis, or clubbing.  NEUROLOGIC: Cranial nerves II through XII are intact. Muscle strength 5/5 in all extremities. Sensation intact. Gait not  checked.  PSYCHIATRIC: The patient is alert and oriented x 3.  Normal affect and good eye contact. SKIN: No obvious rash, lesion, or ulcer.   LABORATORY PANEL:   CBC Recent Labs  Lab 01/07/19 0020  WBC 5.7  HGB 9.8*  HCT 30.5*  PLT 217   ------------------------------------------------------------------------------------------------------------------  Chemistries  Recent Labs  Lab 01/07/19 0020  NA 137  K 3.4*  CL 104  CO2 25  GLUCOSE 98  BUN 13  CREATININE 0.88  CALCIUM 9.0  AST 16  ALT 12  ALKPHOS 37*  BILITOT 0.7   ------------------------------------------------------------------------------------------------------------------  Cardiac Enzymes No results for input(s): TROPONINI in the last 168 hours. ------------------------------------------------------------------------------------------------------------------  RADIOLOGY:  Ct Angio Chest Pe W And/or Wo Contrast  Result Date: 01/07/2019 CLINICAL DATA:  Chest pain EXAM: CT ANGIOGRAPHY CHEST WITH CONTRAST TECHNIQUE: Multidetector CT imaging of the chest was performed using the standard protocol during bolus administration of intravenous contrast. Multiplanar CT image reconstructions and MIPs were obtained to evaluate the vascular anatomy. CONTRAST:  83 mL ISOVUE-370 IOPAMIDOL (ISOVUE-370) INJECTION 76% COMPARISON:  Chest x-ray 07/14/2016 FINDINGS: Cardiovascular: Satisfactory  opacification of the pulmonary arteries to the segmental level. Probable small nonocclusive filling defect/embolus within right upper lobe subsegmental branch vessel though vessels in this region are slightly obscured by streak artifact from adjacent dense contrast in the SVC. No other discrete filling defects are visualized. Aorta is nonaneurysmal. The heart size is slightly enlarged. There is no pericardial effusion. Mediastinum/Nodes: Mildly prominent axillary lymph nodes. Midline trachea. No thyroid mass. Esophagus within normal limits.  Lungs/Pleura: Lungs are clear. No pleural effusion or pneumothorax. Upper Abdomen: No acute abnormality. Musculoskeletal: No chest wall abnormality. No acute or significant osseous findings. Review of the MIP images confirms the above findings. IMPRESSION: 1. Probable small nonocclusive embolus within subsegmental right upper lobe branch vessel though evaluation of vessels in the region is limited by artifact arising from dense contrast within the SVC. There are no other suggested filling defects. 2. Clear lung fields Electronically Signed   By: Donavan Foil M.D.   On: 01/07/2019 02:59      IMPRESSION AND PLAN:  1. hypertensive urgency - Patient's home medications with hydralazine and labetalol have been restarted.  Patient is receiving as needed labetalol and hydralazine as well as initiation of nitroglycerin. - We will continue to trend troponin levels -Repeat EKG in the a.m. -Telemetry monitoring - Cardiology consultation for further evaluation of uncontrolled hypertension with palpitations.  2.  Right upper lobe pulmonary embolism - Heparin infusion initiated  3.  Hypokalemia - Patient received p.o. potassium replacement -Repeat BMP in the a.m. -Patient is on telemetry monitoring -Repeat EKG in the a.m.  4.  Palpitations - May be secondary to hypokalemia - Cardiology consulted for further evaluation given patient's hypertensive urgency despite excellent compliance with antihypertensive medications. - Patient is on telemetry monitoring.  Will repeat EKG in the a.m.  DVT and PPI prophylaxis initiated    All the records are reviewed and case discussed with ED provider. The plan of care was discussed in details with the patient (and family). I answered all questions. The patient agreed to proceed with the above mentioned plan. Further management will depend upon hospital course.   CODE STATUS: Full code  TOTAL TIME TAKING CARE OF THIS PATIENT: 45 minutes.    [place] on 01/07/2019 at 4:57 AM  Pager - [phone]  After 6pm go to [email or web address] - Proofreader  [facility]  Office  [phone]  CC: Primary care physician; Jodelle Green, FNP   Note: This dictation was prepared with Dragon dictation along with smaller phrase technology. Any transcriptional errors that result from this process are unintentional.

## 2019-01-07 NOTE — ED Notes (Signed)
Pt still refusing to be swabbed for covid. Oncoming RN made aware.

## 2019-01-07 NOTE — ED Notes (Signed)
Patient states she still does not want covid-19 testing.  This RN explained to patient that she would have to be treated as presumptive positive and the associated precautions she and staff would have to take.  Patient verbalized understanding.

## 2019-01-07 NOTE — Progress Notes (Signed)
ANTICOAGULATION CONSULT NOTE - Initial Consult  Pharmacy Consult for heparin Indication: pulmonary embolus  Allergies  Allergen Reactions  . Amoxicillin Hives and Other (See Comments)    Has patient had a PCN reaction causing immediate rash, facial/tongue/throat swelling, SOB or lightheadedness with hypotension: No Has patient had a PCN reaction causing severe rash involving mucus membranes or skin necrosis: No Has patient had a PCN reaction that required hospitalization: No Has patient had a PCN reaction occurring within the last 10 years: Yes If all of the above answers are "NO", then may proceed with Cephalosporin use.     Patient Measurements: Height: 5' 2.5" (158.8 cm) Weight: 187 lb 12.8 oz (85.2 kg) IBW/kg (Calculated) : 51.25 Heparin Dosing Weight: 70 kg  Vital Signs: Temp: 98.4 F (36.9 C) (07/16 1939) Temp Source: Oral (07/16 1939) BP: 163/94 (07/16 1939) Pulse Rate: 90 (07/16 1939)  Labs: Recent Labs    01/07/19 0020 01/07/19 0608 01/07/19 1145 01/07/19 1154 01/07/19 2113  HGB 9.8* 9.9*  --   --   --   HCT 30.5* 30.7*  --   --   --   PLT 217 229  --   --   --   APTT  --  34  --   --   --   LABPROT  --  14.9  --   --   --   INR  --  1.2  --   --   --   HEPARINUNFRC  --   --   --  0.33 0.31  CREATININE 0.88 0.74  --   --   --   TROPONINIHS 14 16 5   --   --     Estimated Creatinine Clearance: 97.7 mL/min (by C-G formula based on SCr of 0.74 mg/dL).   Medical History: Past Medical History:  Diagnosis Date  . Anemia   . Anxiety    panic attacks  . Carpal tunnel syndrome, bilateral   . Chlamydia   . Epilepsy seizure, nonconvulsive, generalized ([place])   . Headache(784.0)    migraines  . History of C-section   . Hypertension   . Infection    UTI  . Migraines   . Pregnancy induced hypertension    with first  . Seizures ([place])    No meds, off since in mid to late 20's  . UTI (urinary tract infection)     Medications:  Scheduled:  .  chlorthalidone  25 mg Oral Daily  . hydrALAZINE  50 mg Oral Q8H  . labetalol  400 mg Oral BID  . pantoprazole (PROTONIX) IV  40 mg Intravenous Q24H  . sodium chloride flush  3 mL Intravenous Q12H    Assessment: Patient admitted for palpitations and HTN, found to have non-occlusive PE on CT. Patient is not on any anticoagulation PTA, but is however, on norethindrone, spoke to hospitalist to d/c this for now. Patient is being started on heparin drip for anticoagulation of PE.  Heparin 5000 unit bolus followed by 1150 units/hr infusion  7/16 1154 HL 0.33   Goal of Therapy:  Heparin level 0.3-0.7 units/ml Monitor platelets by anticoagulation protocol: Yes   Plan:  07/16 @ 2100 HL 0.31 therapeutic. Will increase rate to 1250 units/hr and will recheck HL w/ am labs. Will check CBC w/ am labs.  Tobie Lords, PharmD, BCPS Clinical Pharmacist 01/07/2019,10:03 PM

## 2019-01-08 ENCOUNTER — Inpatient Hospital Stay: Payer: BC Managed Care – PPO

## 2019-01-08 ENCOUNTER — Inpatient Hospital Stay
Admit: 2019-01-08 | Discharge: 2019-01-08 | Disposition: A | Discharge status: Home / Self Care | Payer: BC Managed Care – PPO | Attending: Registered Nurse | Admitting: Registered Nurse

## 2019-01-08 DIAGNOSIS — I2699 Other pulmonary embolism without acute cor pulmonale: Secondary | ICD-10-CM | POA: Diagnosis not present

## 2019-01-08 DIAGNOSIS — I160 Hypertensive urgency: Secondary | ICD-10-CM | POA: Diagnosis not present

## 2019-01-08 DIAGNOSIS — E876 Hypokalemia: Secondary | ICD-10-CM | POA: Diagnosis not present

## 2019-01-08 DIAGNOSIS — R002 Palpitations: Secondary | ICD-10-CM | POA: Diagnosis not present

## 2019-01-08 LAB — BASIC METABOLIC PANEL
Anion gap: 9 (ref 5–15)
BUN: 11 mg/dL (ref 6–20)
CO2: 22 mmol/L (ref 22–32)
Calcium: 8.8 mg/dL — ABNORMAL LOW (ref 8.9–10.3)
Chloride: 104 mmol/L (ref 98–111)
Creatinine, Ser: 0.93 mg/dL (ref 0.44–1.00)
GFR calc Af Amer: >60 mL/min (ref >60)
GFR calc non Af Amer: >60 mL/min (ref >60)
Glucose, Bld: 83 mg/dL (ref 70–99)
Potassium: 3.3 mmol/L — ABNORMAL LOW (ref 3.5–5.1)
Sodium: 135 mmol/L (ref 135–145)

## 2019-01-08 LAB — CBC
HCT: 34.2 % — ABNORMAL LOW (ref 36.0–46.0)
Hemoglobin: 11.1 g/dL — ABNORMAL LOW (ref 12.0–15.0)
MCH: 28.5 pg (ref 26.0–34.0)
MCHC: 32.5 g/dL (ref 30.0–36.0)
MCV: 87.9 fL (ref 80.0–100.0)
Platelets: 266 10*3/uL (ref 150–400)
RBC: 3.89 MIL/uL (ref 3.87–5.11)
RDW: 14.6 % (ref 11.5–15.5)
WBC: 6.8 10*3/uL (ref 4.0–10.5)
nRBC: 0 % (ref 0.0–0.2)

## 2019-01-08 LAB — ECHOCARDIOGRAM COMPLETE
Height: 62.5 in
Weight: 3004.8 oz

## 2019-01-08 LAB — MAGNESIUM: Magnesium: 1.9 mg/dL (ref 1.7–2.4)

## 2019-01-08 MED ORDER — APIXABAN 5 MG PO TABS
10.0000 mg | ORAL_TABLET | Freq: Two times a day (BID) | ORAL | Status: DC
  Administered 2019-01-08: 10 mg via ORAL
  Filled 2019-01-08: qty 4
  Filled 2019-01-08: qty 2
  Filled 2019-01-08: qty 4

## 2019-01-08 MED ORDER — AMLODIPINE BESYLATE 5 MG PO TABS
5.0000 mg | ORAL_TABLET | Freq: Every day | ORAL | Status: DC
  Administered 2019-01-08: 5 mg via ORAL
  Filled 2019-01-08: qty 1

## 2019-01-08 MED ORDER — APIXABAN 5 MG PO TABS: ORAL_TABLET | ORAL | 1 refills | Status: DC

## 2019-01-08 MED ORDER — AMLODIPINE BESYLATE 5 MG PO TABS: 5.0000 mg | ORAL_TABLET | Freq: Every day | ORAL | 0 refills | Status: DC

## 2019-01-08 MED ORDER — HYDRALAZINE HCL 50 MG PO TABS: 50.0000 mg | ORAL_TABLET | Freq: Three times a day (TID) | ORAL | 0 refills | Status: DC

## 2019-01-08 MED ORDER — POTASSIUM CHLORIDE CRYS ER 20 MEQ PO TBCR
40.0000 meq | EXTENDED_RELEASE_TABLET | Freq: Once | ORAL | Status: AC
  Administered 2019-01-08: 40 meq via ORAL
  Filled 2019-01-08: qty 2

## 2019-01-08 MED ORDER — APIXABAN 5 MG PO TABS: 5.0000 mg | ORAL_TABLET | Freq: Two times a day (BID) | ORAL | Status: DC

## 2019-01-08 MED ORDER — CHLORTHALIDONE 25 MG PO TABS: 25.0000 mg | ORAL_TABLET | Freq: Every day | ORAL | 0 refills | Status: DC

## 2019-01-08 NOTE — Discharge Summary (Addendum)
[facility] NAME: Mary Roberts    MR#:  [id number]  DATE OF BIRTH:  Dec 02, 1980  DATE OF ADMISSION:  01/07/2019   ADMITTING PHYSICIAN: Christel Mormon, MD  DATE OF DISCHARGE: 01/08/2019  PRIMARY CARE PHYSICIAN: Jodelle Green, FNP   ADMISSION DIAGNOSIS:  Hypertensive urgency [I16.0] Single subsegmental pulmonary embolism without acute cor pulmonale [I26.93] DISCHARGE DIAGNOSIS:  Active Problems:   Hypertensive urgency  SECONDARY DIAGNOSIS:   Past Medical History:  Diagnosis Date  . Anemia   . Anxiety    panic attacks  . Carpal tunnel syndrome, bilateral   . Chlamydia   . Epilepsy seizure, nonconvulsive, generalized ([place])   . Headache(784.0)    migraines  . History of C-section   . Hypertension   . Infection    UTI  . Migraines   . Pregnancy induced hypertension    with first  . Seizures ([place])    No meds, off since in mid to late 20's  . UTI (urinary tract infection)    HOSPITAL COURSE:  1. hypertensive urgency The patient has been treated with labetalol, hydralazine, chlorthalidone and Norvasc with as needed labetalol and hydralazine. Blood pressure is better controlled. Echocardiogram showed normal left ventricular systolic function with severe left ventricular concentric hypertrophy with mild grade 1 diastolic dysfunction secondary to hypertension.  Follow-up with Dr. Humphrey Rolls as outpatient.  2.  Right upper lobe pulmonary embolism The patient has been treated with heparin drip.  Change to Eliquis p.o. Venous duplex report no DVT.  Follow-up oncologist as outpatient.  3.  Hypokalemia  Potassium supplement given.  Follow-up level with PCP.  4.  Palpitations Improved.  Follow-up with Dr. Humphrey Rolls as outpatient.Marland Kitchen  DISCHARGE CONDITIONS:  Stable, discharge to home today. CONSULTS OBTAINED:  Treatment Team:  Dionisio David, MD DRUG ALLERGIES:   Allergies  Allergen Reactions  . Amoxicillin Hives and Other (See  Comments)    Has patient had a PCN reaction causing immediate rash, facial/tongue/throat swelling, SOB or lightheadedness with hypotension: No Has patient had a PCN reaction causing severe rash involving mucus membranes or skin necrosis: No Has patient had a PCN reaction that required hospitalization: No Has patient had a PCN reaction occurring within the last 10 years: Yes If all of the above answers are "NO", then may proceed with Cephalosporin use.    DISCHARGE MEDICATIONS:   Allergies as of 01/08/2019      Reactions   Amoxicillin Hives, Other (See Comments)   Has patient had a PCN reaction causing immediate rash, facial/tongue/throat swelling, SOB or lightheadedness with hypotension: No Has patient had a PCN reaction causing severe rash involving mucus membranes or skin necrosis: No Has patient had a PCN reaction that required hospitalization: No Has patient had a PCN reaction occurring within the last 10 years: Yes If all of the above answers are "NO", then may proceed with Cephalosporin use.      Medication List    TAKE these medications   amLODipine 5 MG tablet Commonly known as: NORVASC Take 1 tablet (5 mg total) by mouth daily. Start taking on: January 09, 2019   apixaban 5 MG Tabs tablet Commonly known as: ELIQUIS 10 mg po bid for 13 doses, then 5 mg po bid   chlorthalidone 25 MG tablet Commonly known as: HYGROTON Take 1 tablet (25 mg total) by mouth daily. Start taking on: January 09, 2019   hydrALAZINE 50 MG tablet Commonly known as: APRESOLINE Take  1 tablet (50 mg total) by mouth every 8 (eight) hours. What changed: when to take this   labetalol 200 MG tablet Commonly known as: NORMODYNE Take 2 tablets (400 mg total) by mouth 2 (two) times daily.   norethindrone 5 MG tablet Commonly known as: AYGESTIN Take 1 tablet (5 mg total) by mouth daily.        DISCHARGE INSTRUCTIONS:  See AVS.  If you experience worsening of your admission symptoms, develop  shortness of breath, life threatening emergency, suicidal or homicidal thoughts you must seek medical attention immediately by calling 911 or calling your MD immediately  if symptoms less severe.  You Must read complete instructions/literature along with all the possible adverse reactions/side effects for all the Medicines you take and that have been prescribed to you. Take any new Medicines after you have completely understood and accpet all the possible adverse reactions/side effects.   Please note  You were cared for by a hospitalist during your hospital stay. If you have any questions about your discharge medications or the care you received while you were in the hospital after you are discharged, you can call the unit and asked to speak with the hospitalist on call if the hospitalist that took care of you is not available. Once you are discharged, your primary care physician will handle any further medical issues. Please note that NO REFILLS for any discharge medications will be authorized once you are discharged, as it is imperative that you return to your primary care physician (or establish a relationship with a primary care physician if you do not have one) for your aftercare needs so that they can reassess your need for medications and monitor your lab values.    On the day of Discharge:  VITAL SIGNS:  Blood pressure (!) 163/88, pulse 85, temperature 98.4 F (36.9 C), temperature source Oral, resp. rate 19, height 5' 2.5" (1.588 m), weight 85.2 kg, SpO2 98 %. PHYSICAL EXAMINATION:  GENERAL:  38 y.o.-year-old patient lying in the bed with no acute distress.  EYES: Pupils equal, round, reactive to light and accommodation. No scleral icterus. Extraocular muscles intact.  HEENT: Head atraumatic, normocephalic. Oropharynx and nasopharynx clear.  NECK:  Supple, no jugular venous distention. No thyroid enlargement, no tenderness.  LUNGS: Normal breath sounds bilaterally, no wheezing,  rales,rhonchi or crepitation. No use of accessory muscles of respiration.  CARDIOVASCULAR: S1, S2 normal. No murmurs, rubs, or gallops.  ABDOMEN: Soft, non-tender, non-distended. Bowel sounds present. No organomegaly or mass.  EXTREMITIES: No pedal edema, cyanosis, or clubbing.  NEUROLOGIC: Cranial nerves II through XII are intact. Muscle strength 5/5 in all extremities. Sensation intact. Gait not checked.  PSYCHIATRIC: The patient is alert and oriented x 3.  SKIN: No obvious rash, lesion, or ulcer.  DATA REVIEW:   CBC Recent Labs  Lab 01/07/19 0608  WBC 4.8  HGB 9.9*  HCT 30.7*  PLT 229    Chemistries  Recent Labs  Lab 01/07/19 0020 01/07/19 0608  NA 137 135  K 3.4* 3.2*  CL 104 102  CO2 25 24  GLUCOSE 98 92  BUN 13 9  CREATININE 0.88 0.74  CALCIUM 9.0 8.8*  AST 16  --   ALT 12  --   ALKPHOS 37*  --   BILITOT 0.7  --      Microbiology Results  Results for orders placed or performed during the hospital encounter of 07/14/16  Urine culture     Status: Abnormal   Collection Time:  07/14/16  4:52 AM   Specimen: Urine, Random  Result Value Ref Range Status   Specimen Description URINE, RANDOM  Final   Special Requests NONE  Final   Culture MULTIPLE SPECIES PRESENT, SUGGEST RECOLLECTION (A)  Final   Report Status 07/15/2016 FINAL  Final  Respiratory Panel by PCR     Status: None   Collection Time: 07/14/16  6:06 AM   Specimen: Nasopharyngeal Swab; Respiratory  Result Value Ref Range Status   Adenovirus NOT DETECTED NOT DETECTED Final   Coronavirus 229E NOT DETECTED NOT DETECTED Final   Coronavirus HKU1 NOT DETECTED NOT DETECTED Final   Coronavirus NL63 NOT DETECTED NOT DETECTED Final   Coronavirus OC43 NOT DETECTED NOT DETECTED Final   Metapneumovirus NOT DETECTED NOT DETECTED Final   Rhinovirus / Enterovirus NOT DETECTED NOT DETECTED Final   Influenza A NOT DETECTED NOT DETECTED Final   Influenza B NOT DETECTED NOT DETECTED Final   Parainfluenza Virus 1 NOT  DETECTED NOT DETECTED Final   Parainfluenza Virus 2 NOT DETECTED NOT DETECTED Final   Parainfluenza Virus 3 NOT DETECTED NOT DETECTED Final   Parainfluenza Virus 4 NOT DETECTED NOT DETECTED Final   Respiratory Syncytial Virus NOT DETECTED NOT DETECTED Final   Bordetella pertussis NOT DETECTED NOT DETECTED Final   Chlamydophila pneumoniae NOT DETECTED NOT DETECTED Final   Mycoplasma pneumoniae NOT DETECTED NOT DETECTED Final  Culture, blood (routine x 2) Call MD if unable to obtain prior to antibiotics being given     Status: None   Collection Time: 07/14/16  7:56 AM   Specimen: BLOOD RIGHT HAND  Result Value Ref Range Status   Specimen Description BLOOD RIGHT HAND  Final   Special Requests BOTTLES DRAWN AEROBIC AND ANAEROBIC 5CC  Final   Culture NO GROWTH 5 DAYS  Final   Report Status 07/19/2016 FINAL  Final  Culture, blood (routine x 2) Call MD if unable to obtain prior to antibiotics being given     Status: None   Collection Time: 07/14/16  8:00 AM   Specimen: BLOOD  Result Value Ref Range Status   Specimen Description BLOOD RIGHT ANTECUBITAL  Final   Special Requests IN PEDIATRIC BOTTLE 3CC  Final   Culture NO GROWTH 5 DAYS  Final   Report Status 07/19/2016 FINAL  Final    RADIOLOGY:  No results found.   Management plans discussed with the patient, family and they are in agreement.  CODE STATUS: Full Code   TOTAL TIME TAKING CARE OF THIS PATIENT: 32 minutes.    Demetrios Loll M.D on 01/08/2019 at 10:24 AM  Between 7am to 6pm - Pager - [phone]  After 6pm go to [email or web address] - Proofreader  [facility]  Office  [phone]  CC: Primary care physician; Jodelle Green, FNP   Note: This dictation was prepared with Dragon dictation along with smaller phrase technology. Any transcriptional errors that result from this process are unintentional.

## 2019-01-08 NOTE — TOC Transition Note (Signed)
Transition of Care [facility]) - CM/SW Discharge Note   Patient Details  Name: Mary Roberts MRN: [id number] Date of Birth: 1980-07-03  Transition of Care [facility] CM/SW Contact:  Katrina Stack, RN Phone Number: 01/08/2019, 12:46 PM   Clinical Narrative:   Patient for discharge today.  She is being discharged home on Eliquis - a new medication. Patient verbally confirms insurance with pharmacy coverage.Provided with 30 day trial coupon and copay coupon via patient's primary nurse as patient is on isolation          Patient Goals and CMS Choice        Discharge Placement                       Discharge Plan and Services                                     Social Determinants of Health (SDOH) Interventions     Readmission Risk Interventions No flowsheet data found.

## 2019-01-08 NOTE — Consult Note (Signed)
[facility] for Apixaban Indication: pulmonary embolus  Allergies  Allergen Reactions  . Amoxicillin Hives and Other (See Comments)    Has patient had a PCN reaction causing immediate rash, facial/tongue/throat swelling, SOB or lightheadedness with hypotension: No Has patient had a PCN reaction causing severe rash involving mucus membranes or skin necrosis: No Has patient had a PCN reaction that required hospitalization: No Has patient had a PCN reaction occurring within the last 10 years: Yes If all of the above answers are "NO", then may proceed with Cephalosporin use.     Patient Measurements: Height: 5' 2.5" (158.8 cm) Weight: 187 lb 12.8 oz (85.2 kg) IBW/kg (Calculated) : 51.25  Vital Signs: Temp: 98.4 F (36.9 C) (07/17 0815) Temp Source: Oral (07/17 0815) BP: 163/88 (07/17 0815) Pulse Rate: 85 (07/17 0815)  Labs: Recent Labs    01/07/19 0020 01/07/19 2025 01/07/19 1145 01/07/19 1154 01/07/19 2113  HGB 9.8* 9.9*  --   --   --   HCT 30.5* 30.7*  --   --   --   PLT 217 229  --   --   --   APTT  --  34  --   --   --   LABPROT  --  14.9  --   --   --   INR  --  1.2  --   --   --   HEPARINUNFRC  --   --   --  0.33 0.31  CREATININE 0.88 0.74  --   --   --   TROPONINIHS 14 16 5   --   --     Estimated Creatinine Clearance: 97.7 mL/min (by C-G formula based on SCr of 0.74 mg/dL).   Medical History: Past Medical History:  Diagnosis Date  . Anemia   . Anxiety    panic attacks  . Carpal tunnel syndrome, bilateral   . Chlamydia   . Epilepsy seizure, nonconvulsive, generalized ([place])   . Headache(784.0)    migraines  . History of C-section   . Hypertension   . Infection    UTI  . Migraines   . Pregnancy induced hypertension    with first  . Seizures ([place])    No meds, off since in mid to late 20's  . UTI (urinary tract infection)     Medications:  Medications Prior to Admission  Medication Sig Dispense Refill Last  Dose  . hydrALAZINE (APRESOLINE) 50 MG tablet Take 1 tablet (50 mg total) by mouth 2 (two) times daily. 180 tablet 1 01/07/2019 at 2000  . labetalol (NORMODYNE) 200 MG tablet Take 2 tablets (400 mg total) by mouth 2 (two) times daily. 360 tablet 1 01/07/2019 at 2000  . norethindrone (AYGESTIN) 5 MG tablet Take 1 tablet (5 mg total) by mouth daily. 30 tablet 11 01/07/2019 at Unknown time   Scheduled:  . amLODipine  5 mg Oral Daily  . chlorthalidone  25 mg Oral Daily  . hydrALAZINE  50 mg Oral Q8H  . labetalol  400 mg Oral BID  . pantoprazole (PROTONIX) IV  40 mg Intravenous Q24H  . sodium chloride flush  3 mL Intravenous Q12H   Infusions:  . sodium chloride    . heparin 1,250 Units/hr (01/07/19 2219)   PRN: sodium chloride, acetaminophen **OR** acetaminophen, hydrALAZINE, ondansetron **OR** ondansetron (ZOFRAN) IV, polyethylene glycol, sodium chloride flush  Assessment: Pharmacy consulted to start apixaban. Previously on heparin.    Goal of Therapy:  Monitor platelets  by anticoagulation protocol: Yes   Plan:  Apixaban 10 mg BID x 7 days followed by apixaban 5 mg BID.   Oswald Hillock, PharmD, BCPS  01/08/2019,10:29 AM

## 2019-01-08 NOTE — Progress Notes (Signed)
*  PRELIMINARY RESULTS* Echocardiogram 2D Echocardiogram has been performed.  Sherrie Sport 01/08/2019, 8:34 AM

## 2019-01-08 NOTE — Progress Notes (Signed)
SUBJECTIVE: Patient has shortness of breath but feeling much better   Vitals:   01/07/19 1652 01/07/19 1939 01/08/19 0548 01/08/19 0815  BP: (!) 149/85 (!) 163/94 (!) 182/97 (!) 163/88  Pulse: 87 90 79 85  Resp:  20 18 19   Temp:  98.4 F (36.9 C) 98.6 F (37 C) 98.4 F (36.9 C)  TempSrc:  Oral Oral Oral  SpO2: 99% 98% 98% 98%  Weight:      Height:       No intake or output data in the 24 hours ending 01/08/19 0923  LABS: Basic Metabolic Panel: Recent Labs    01/07/19 0020 01/07/19 0608  NA 137 135  K 3.4* 3.2*  CL 104 102  CO2 25 24  GLUCOSE 98 92  BUN 13 9  CREATININE 0.88 0.74  CALCIUM 9.0 8.8*   Liver Function Tests: Recent Labs    01/07/19 0020  AST 16  ALT 12  ALKPHOS 37*  BILITOT 0.7  PROT 7.5  ALBUMIN 3.9   No results for input(s): LIPASE, AMYLASE in the last 72 hours. CBC: Recent Labs    01/07/19 0020 01/07/19 0608  WBC 5.7 4.8  NEUTROABS 3.6  --   HGB 9.8* 9.9*  HCT 30.5* 30.7*  MCV 88.9 89.8  PLT 217 229   Cardiac Enzymes: No results for input(s): CKTOTAL, CKMB, CKMBINDEX, TROPONINI in the last 72 hours. BNP: Invalid input(s): POCBNP D-Dimer: No results for input(s): DDIMER in the last 72 hours. Hemoglobin A1C: No results for input(s): HGBA1C in the last 72 hours. Fasting Lipid Panel: No results for input(s): CHOL, HDL, LDLCALC, TRIG, CHOLHDL, LDLDIRECT in the last 72 hours. Thyroid Function Tests: Recent Labs    01/07/19 0608  TSH 1.693   Anemia Panel: No results for input(s): VITAMINB12, FOLATE, FERRITIN, TIBC, IRON, RETICCTPCT in the last 72 hours.   PHYSICAL EXAM General: Well developed, well nourished, in no acute distress HEENT:  Normocephalic and atramatic Neck:  No JVD.  Lungs: Clear bilaterally to auscultation and percussion. Heart: HRRR . Normal S1 and S2 without gallops or murmurs.  Abdomen: Bowel sounds are positive, abdomen soft and non-tender  Msk:  Back normal, normal gait. Normal strength and tone for  age. Extremities: No clubbing, cyanosis or edema.   Neuro: Alert and oriented X 3. Psych:  Good affect, responds appropriately  TELEMETRY: Sinus rhythm  ASSESSMENT AND PLAN: Echocardiogram showed normal left ventricular systolic function with severe left ventricular concentric hypertrophy with mild grade 1 diastolic dysfunction secondary to hypertension.  Explained to the patient that patient needs to follow-up Monday at 10:00 in my office and need to aggressively treat hypotension because that is endorgan damage with severe left ventricular hypertrophy but systolic function is still normal.  Advise blood pressure goal of 130/80.  Will adjust med medication further and can be discharged at the end of the day.  Active Problems:   Hypertensive urgency    Dionisio David, MD, [facility] 01/08/2019 9:23 AM

## 2019-01-12 NOTE — Progress Notes (Signed)
New Outpatient Visit Date: 01/14/2019  Referring Provider: Jodelle Green, FNP [address] [place],  [place] [address]  Chief Complaint: Elevated blood pressure and palpitations  HPI:  [name] is a 38 y.o. female who is being seen today for the evaluation of uncontrolled hypertension at the request of Ms. Chauncy Passy. She has a history of hypertension, preeclampsia, seizure disorder.  She was seen by her PCP on 01/04/2019, at which time markedly elevated blood pressures were noted.  She presented to the ED 3 days later with continued uncontrolled hypertension as well as chest pain and shortness of breath.  CTA of the chest showed probable nonocclusive pulmonary embolism within segmental right upper lobe branch vessel.  Patient was admitted for anticoagulation and blood pressure control.  She was seen by Dr. Humphrey Rolls as an inpatient and subsequently discharged on a regimen of amlodipine, chlorthalidone, hydralazine, labetalol, and apixaban.  [name] reports that she is feeling okay today, though she notes some fatigue in the morning after she first takes her medications.  Her blood pressure has been under much better control since being discharged; home BP reading this morning was 128/78.  She does not have any other side effects from her current medications, though she states that she does not like being on medications.  She has not tolerated HCTZ or ACE inhibitors well in the past.  She has not had any significant bleeding since being started on apixaban.  However, she has a history of menorrhagia requiring PRBC transfusions in the past (this prompted initiation of Aygestin).  Ms. Dimitri notes that increased skipped beats brought her to the ED last week.  Since being discharged, she has not had any further palpitations.  There were no associated symptoms when she was experiencing the palpitations earlier this month.  She denies shortness of breath, lightheadedness, and edema.  She reports  a single episode of chest pain around the time of her hospitalization that she describes as pressure that resolved promptly with Gas-X.  She has not had any other chest discomfort.  She denies a history of prior cardiac disease or testing.  She did not follow-up with Dr. Humphrey Rolls after her hospitalization last week.  --------------------------------------------------------------------------------------------------  Cardiovascular History & Procedures: Cardiovascular Problems:  Pulmonary embolism  Hypertension  Risk Factors:  Hypertension and obesity  Cath/PCI:  None  CV Surgery:  None  EP Procedures and Devices:  None  Non-Invasive Evaluation(s):  TTE (01/08/2019): Normal LV size with moderate to severe LVH.  LVEF 60-65% with grade 1 diastolic dysfunction and elevated filling pressure.  Normal RV size and function.  Mild aortic valve calcification with trivial AI.  LE venous Duplex (01/08/2019): No DVT in either lower extremity.   Recent CV Pertinent Labs: Lab Results  Component Value Date   INR 1.2 01/07/2019   K 3.3 (L) 01/08/2019   MG 1.9 01/08/2019   BUN 11 01/08/2019   CREATININE 0.93 01/08/2019   CREATININE 0.51 10/21/2013    --------------------------------------------------------------------------------------------------  Past Medical History:  Diagnosis Date  . Anemia   . Anxiety    panic attacks  . Carpal tunnel syndrome, bilateral   . Chlamydia   . Epilepsy seizure, nonconvulsive, generalized ([place])   . Headache(784.0)    migraines  . History of C-section   . Hypertension   . Infection    UTI  . Migraines   . Pregnancy induced hypertension    with first  . Seizures ([place])    No meds, off since  in mid to late 20's  . UTI (urinary tract infection)     Past Surgical History:  Procedure Laterality Date  . CESAREAN SECTION    . MOUTH SURGERY    . TUBAL LIGATION N/A 10/30/2013   Procedure: POST PARTUM TUBAL LIGATION;  Surgeon: [name], MD;   Location: [facility];  Service: Gynecology;  Laterality: N/A;    Current Meds  Medication Sig  . amLODipine (NORVASC) 5 MG tablet Take 1 tablet (5 mg total) by mouth daily.  Marland Kitchen apixaban (ELIQUIS) 5 MG TABS tablet 10 mg po bid for 13 doses, then 5 mg po bid  . aspirin-acetaminophen-caffeine (EXCEDRIN MIGRAINE) 250-250-65 MG tablet Take by mouth every 6 (six) hours as needed for headache.  . chlorthalidone (HYGROTON) 25 MG tablet Take 1 tablet (25 mg total) by mouth daily.  . hydrALAZINE (APRESOLINE) 50 MG tablet Take 1 tablet (50 mg total) by mouth every 8 (eight) hours.  Marland Kitchen labetalol (NORMODYNE) 200 MG tablet Take 2 tablets (400 mg total) by mouth 2 (two) times daily.  . norethindrone (AYGESTIN) 5 MG tablet Take 1 tablet (5 mg total) by mouth daily.  . Simethicone (GAS-X PO) Take by mouth as needed.    Allergies: Amoxicillin  Social History   Tobacco Use  . Smoking status: Former Research scientist (life sciences)  . Smokeless tobacco: Never Used  Substance Use Topics  . Alcohol use: Yes    Comment: socially   . Drug use: No    Family History  Problem Relation Age of Onset  . Seizures Mother   . Hypertension Mother   . Diabetes Mother   . Heart disease Mother   . Stroke Mother   . Arthritis Mother   . Seizures Brother   . Hearing loss Neg Hx     Review of Systems: A 12-system review of systems was performed and was negative except as noted in the HPI.  --------------------------------------------------------------------------------------------------  Physical Exam: BP 136/84 (BP Location: Left Arm, Patient Position: Sitting, Cuff Size: Normal)   Pulse 71   Ht 5' 2.5" (1.588 m)   Wt 183 lb 12 oz (83.3 kg)   BMI 33.07 kg/m   General:  NAD. HEENT: No conjunctival pallor or scleral icterus. Facemask in place Neck: Supple without lymphadenopathy, thyromegaly, JVD, or HJR. Lungs: Normal work of breathing. Clear to auscultation bilaterally without wheezes or crackles. Heart: Regular rate and rhythm  without murmurs, rubs, or gallops. Non-displaced PMI. Abd: Bowel sounds present. Soft, NT/ND without hepatosplenomegaly Ext: No lower extremity edema. Radial, PT, and DP pulses are 2+ bilaterally Skin: Warm and dry without rash. Neuro: CNIII-XII intact grossly intact.  No focal deficit. Psych: Normal mood and affect.  EKG:  NSR with poor R-wave progression in V1-3.  Otherwise, no significant abnormality.  Lab Results  Component Value Date   WBC 6.8 01/08/2019   HGB 11.1 (L) 01/08/2019   HCT 34.2 (L) 01/08/2019   MCV 87.9 01/08/2019   PLT 266 01/08/2019    Lab Results  Component Value Date   NA 135 01/08/2019   K 3.3 (L) 01/08/2019   CL 104 01/08/2019   CO2 22 01/08/2019   BUN 11 01/08/2019   CREATININE 0.93 01/08/2019   GLUCOSE 83 01/08/2019   ALT 12 01/07/2019    No results found for: CHOL, HDL, LDLCALC, LDLDIRECT, TRIG, CHOLHDL   --------------------------------------------------------------------------------------------------  ASSESSMENT AND PLAN: Hypertension: BP historically has been poorly controlled, dating back at least 5 years following Ms. Fesperman' last pregnancy.  Home readings have  improved since recent hospitalization, with BP upper normal today.  We will increase amlodipine to 10 mg daily and decrease hydralazine to 25 mg Q8 hours with the hope of weaning her of hydralazine and consolidating her regimen.  We discussed workup for secondary hypertension (e.g. renal artery Doppler, serum aldosterone, plasma renin activity, and urine catecholamines/metanephrines) but have agreed to defer this for now.  I think it would also be reasonable to avoid progestins, including Aygestin, if possible.  I have encouraged Ms. Colantonio to minimize her sodium intake and have provided her with information about the DASH diet.  Pulmonary embolism: Small segmental PE suspected during recent hospitalization (though artifact was present).  No DVT was noted.  Only potential risk factor  is Agystin use.  I have advised Ms. Rutt to speak with her gynecologist about alternative therapies with less risk of thrombosis.  Ms. Lagunes should remain on apixaban; I will defer long-term management of anticoagulation to her PCP, Ms. Chauncy Passy.  Abnormal EKG and echocardiogram: EKG shows poor R-wave progression, which has previously been noted and may reflect lead placement.  Echo notable for moderate to severe LVH, likely related to long-standing and untreated hypertension.  We will work on BP control, as above.  We will defer ischemia testing at this time, given lack of angina.  Follow-up: Return to clinic in 6 weeks; she will need a BMP at that time.  Nelva Bush, MD 01/14/2019 2:04 PM

## 2019-01-13 ENCOUNTER — Telehealth: Payer: Self-pay | Admitting: Internal Medicine

## 2019-01-13 NOTE — Telephone Encounter (Signed)

## 2019-01-14 ENCOUNTER — Other Ambulatory Visit: Payer: Self-pay

## 2019-01-14 ENCOUNTER — Encounter: Payer: Self-pay | Admitting: Internal Medicine

## 2019-01-14 ENCOUNTER — Ambulatory Visit (INDEPENDENT_AMBULATORY_CARE_PROVIDER_SITE_OTHER): Payer: BC Managed Care – PPO | Admitting: Internal Medicine

## 2019-01-14 VITALS — BP 136/84 | HR 71 | Ht 62.5 in | Wt 183.8 lb

## 2019-01-14 DIAGNOSIS — R9431 Abnormal electrocardiogram [ECG] [EKG]: Secondary | ICD-10-CM | POA: Diagnosis not present

## 2019-01-14 DIAGNOSIS — R931 Abnormal findings on diagnostic imaging of heart and coronary circulation: Secondary | ICD-10-CM | POA: Diagnosis not present

## 2019-01-14 DIAGNOSIS — I2699 Other pulmonary embolism without acute cor pulmonale: Secondary | ICD-10-CM | POA: Diagnosis not present

## 2019-01-14 DIAGNOSIS — I10 Essential (primary) hypertension: Secondary | ICD-10-CM

## 2019-01-14 MED ORDER — HYDRALAZINE HCL 25 MG PO TABS: 25.0000 mg | ORAL_TABLET | Freq: Three times a day (TID) | ORAL | 2 refills | Status: DC

## 2019-01-14 MED ORDER — AMLODIPINE BESYLATE 10 MG PO TABS: 10.0000 mg | ORAL_TABLET | Freq: Every day | ORAL | 6 refills | Status: DC

## 2019-01-14 MED ORDER — HYDRALAZINE HCL 50 MG PO TABS: 25.0000 mg | ORAL_TABLET | Freq: Three times a day (TID) | ORAL | 0 refills | Status: DC

## 2019-01-14 NOTE — Patient Instructions (Signed)
Medication Instructions:  Your physician has recommended you make the following change in your medication:  1- INCREASE Amlodipine to 10 mg by mouth once a day. 2- DECREASE Hydralazine to 25 mg (0.5 tablet) by mouth every 8 hours.   If you need a refill on your cardiac medications before your next appointment, please call your pharmacy.   Lab work: - None ordered.  If you have labs (blood work) drawn today and your tests are completely normal, you will receive your results only by: Marland Kitchen MyChart Message (if you have MyChart) OR . A paper copy in the mail If you have any lab test that is abnormal or we need to change your treatment, we will call you to review the results.  Testing/Procedures: - None ordered.   Follow-Up: At [facility], you and your health needs are our priority.  As part of our continuing mission to provide you with exceptional heart care, we have created designated Provider Care Teams.  These Care Teams include your primary Cardiologist (physician) and Advanced Practice Providers (APPs -  Physician Assistants and Nurse Practitioners) who all work together to provide you with the care you need, when you need it. You will need a follow up appointment in 6 weeks with Dr End or APP.  Please call our office 2 months in advance to schedule this appointment.  You may see DR Harrell Gave END or one of the following Advanced Practice Providers on your designated Care Team:   Murray Hodgkins, NP Christell Faith, PA-C . Marrianne Mood, PA-C     DASH Eating Plan DASH stands for "Dietary Approaches to Stop Hypertension." The DASH eating plan is a healthy eating plan that has been shown to reduce high blood pressure (hypertension). It may also reduce your risk for type 2 diabetes, heart disease, and stroke. The DASH eating plan may also help with weight loss. What are tips for following this plan?  General guidelines  Avoid eating more than 2,300 mg (milligrams) of salt (sodium) a  day. If you have hypertension, you may need to reduce your sodium intake to 1,500 mg a day.  Limit alcohol intake to no more than 1 drink a day for nonpregnant women and 2 drinks a day for men. One drink equals 12 oz of beer, 5 oz of wine, or 1 oz of hard liquor.  Work with your health care provider to maintain a healthy body weight or to lose weight. Ask what an ideal weight is for you.  Get at least 30 minutes of exercise that causes your heart to beat faster (aerobic exercise) most days of the week. Activities may include walking, swimming, or biking.  Work with your health care provider or diet and nutrition specialist (dietitian) to adjust your eating plan to your individual calorie needs. Reading food labels   Check food labels for the amount of sodium per serving. Choose foods with less than 5 percent of the Daily Value of sodium. Generally, foods with less than 300 mg of sodium per serving fit into this eating plan.  To find whole grains, look for the word "whole" as the first word in the ingredient list. Shopping  Buy products labeled as "low-sodium" or "no salt added."  Buy fresh foods. Avoid canned foods and premade or frozen meals. Cooking  Avoid adding salt when cooking. Use salt-free seasonings or herbs instead of table salt or sea salt. Check with your health care provider or pharmacist before using salt substitutes.  Do not fry foods.  Cook foods using healthy methods such as baking, boiling, grilling, and broiling instead.  Cook with heart-healthy oils, such as olive, canola, soybean, or sunflower oil. Meal planning  Eat a balanced diet that includes: ? 5 or more servings of fruits and vegetables each day. At each meal, try to fill half of your plate with fruits and vegetables. ? Up to 6-8 servings of whole grains each day. ? Less than 6 oz of lean meat, poultry, or fish each day. A 3-oz serving of meat is about the same size as a deck of cards. One egg equals 1 oz.  ? 2 servings of low-fat dairy each day. ? A serving of nuts, seeds, or beans 5 times each week. ? Heart-healthy fats. Healthy fats called Omega-3 fatty acids are found in foods such as flaxseeds and coldwater fish, like sardines, salmon, and mackerel.  Limit how much you eat of the following: ? Canned or prepackaged foods. ? Food that is high in trans fat, such as fried foods. ? Food that is high in saturated fat, such as fatty meat. ? Sweets, desserts, sugary drinks, and other foods with added sugar. ? Full-fat dairy products.  Do not salt foods before eating.  Try to eat at least 2 vegetarian meals each week.  Eat more home-cooked food and less restaurant, buffet, and fast food.  When eating at a restaurant, ask that your food be prepared with less salt or no salt, if possible. What foods are recommended? The items listed may not be a complete list. Talk with your dietitian about what dietary choices are best for you. Grains Whole-grain or whole-wheat bread. Whole-grain or whole-wheat pasta. Brown rice. [place]. Bulgur. Whole-grain and low-sodium cereals. Pita bread. Low-fat, low-sodium crackers. Whole-wheat flour tortillas. Vegetables Fresh or frozen vegetables (raw, steamed, roasted, or grilled). Low-sodium or reduced-sodium tomato and vegetable juice. Low-sodium or reduced-sodium tomato sauce and tomato paste. Low-sodium or reduced-sodium canned vegetables. Fruits All fresh, dried, or frozen fruit. Canned fruit in natural juice (without added sugar). Meat and other protein foods Skinless chicken or [place]. Ground chicken or [place]. Pork with fat trimmed off. Fish and seafood. Egg whites. Dried beans, peas, or lentils. Unsalted nuts, nut butters, and seeds. Unsalted canned beans. Lean cuts of beef with fat trimmed off. Low-sodium, lean deli meat. Dairy Low-fat (1%) or fat-free (skim) milk. Fat-free, low-fat, or reduced-fat cheeses. Nonfat, low-sodium ricotta or cottage  cheese. Low-fat or nonfat yogurt. Low-fat, low-sodium cheese. Fats and oils Soft margarine without trans fats. Vegetable oil. Low-fat, reduced-fat, or light mayonnaise and salad dressings (reduced-sodium). Canola, safflower, olive, soybean, and sunflower oils. Avocado. Seasoning and other foods Herbs. Spices. Seasoning mixes without salt. Unsalted popcorn and pretzels. Fat-free sweets. What foods are not recommended? The items listed may not be a complete list. Talk with your dietitian about what dietary choices are best for you. Grains Baked goods made with fat, such as croissants, muffins, or some breads. Dry pasta or rice meal packs. Vegetables Creamed or fried vegetables. Vegetables in a cheese sauce. Regular canned vegetables (not low-sodium or reduced-sodium). Regular canned tomato sauce and paste (not low-sodium or reduced-sodium). Regular tomato and vegetable juice (not low-sodium or reduced-sodium). Angie Fava. Olives. Fruits Canned fruit in a light or heavy syrup. Fried fruit. Fruit in cream or butter sauce. Meat and other protein foods Fatty cuts of meat. Ribs. Fried meat. Berniece Salines. Sausage. Bologna and other processed lunch meats. Salami. Fatback. Hotdogs. Bratwurst. Salted nuts and seeds. Canned beans with added salt. Canned or  smoked fish. Whole eggs or egg yolks. Chicken or [place] with skin. Dairy Whole or 2% milk, cream, and half-and-half. Whole or full-fat cream cheese. Whole-fat or sweetened yogurt. Full-fat cheese. Nondairy creamers. Whipped toppings. Processed cheese and cheese spreads. Fats and oils Butter. Stick margarine. Lard. Shortening. Ghee. Bacon fat. Tropical oils, such as coconut, palm kernel, or palm oil. Seasoning and other foods Salted popcorn and pretzels. Onion salt, garlic salt, seasoned salt, table salt, and sea salt. Worcestershire sauce. Tartar sauce. Barbecue sauce. Teriyaki sauce. Soy sauce, including reduced-sodium. Steak sauce. Canned and packaged gravies.  Fish sauce. Oyster sauce. Cocktail sauce. Horseradish that you find on the shelf. Ketchup. Mustard. Meat flavorings and tenderizers. Bouillon cubes. Hot sauce and Tabasco sauce. Premade or packaged marinades. Premade or packaged taco seasonings. Relishes. Regular salad dressings. Where to find more information:  National Heart, Lung, and [place]: https://wilson-eaton.com/  American Heart Association: www.heart.org Summary  The DASH eating plan is a healthy eating plan that has been shown to reduce high blood pressure (hypertension). It may also reduce your risk for type 2 diabetes, heart disease, and stroke.  With the DASH eating plan, you should limit salt (sodium) intake to 2,300 mg a day. If you have hypertension, you may need to reduce your sodium intake to 1,500 mg a day.  When on the DASH eating plan, aim to eat more fresh fruits and vegetables, whole grains, lean proteins, low-fat dairy, and heart-healthy fats.  Work with your health care provider or diet and nutrition specialist (dietitian) to adjust your eating plan to your individual calorie needs. This information is not intended to replace advice given to you by your health care provider. Make sure you discuss any questions you have with your health care provider. Document Released: 05/30/2011 Document Revised: 05/23/2017 Document Reviewed: 06/03/2016 Elsevier Patient Education  2020 [employer].

## 2019-02-01 ENCOUNTER — Telehealth: Payer: Self-pay | Admitting: Family Medicine

## 2019-02-01 ENCOUNTER — Ambulatory Visit: Payer: Self-pay | Admitting: Pharmacist

## 2019-02-01 ENCOUNTER — Other Ambulatory Visit: Payer: Self-pay

## 2019-02-01 ENCOUNTER — Telehealth: Payer: Self-pay | Admitting: Lab

## 2019-02-01 DIAGNOSIS — I2699 Other pulmonary embolism without acute cor pulmonale: Secondary | ICD-10-CM

## 2019-02-01 DIAGNOSIS — I10 Essential (primary) hypertension: Secondary | ICD-10-CM

## 2019-02-01 DIAGNOSIS — Z20822 Contact with and (suspected) exposure to covid-19: Secondary | ICD-10-CM

## 2019-02-01 NOTE — Telephone Encounter (Signed)
Call pt She needs hospitalization f/u for PE and HTN urgency. Appears she had pulmonary embolism   Advise her not to make medicaiton changes until she has hospital f/u appt.  Please schedule either with me or Lauren when she is back

## 2019-02-01 NOTE — Telephone Encounter (Signed)
Called Pt back and told her that Ander Purpura was out of the office until Thursday, but if she wanted to see a different provider at our office, that the Pt could. The Pt stated that they gave her Eliquis in the hospital with refills, and she wanted to know if she should continue taking the Eliquis because she does not want to be on this medication for a long time. I told the Pt to read her after visit summary again, and to set up a hospital F/U with NP Philis Nettle.

## 2019-02-01 NOTE — Telephone Encounter (Signed)
Patient requesting call back from CMA to discuss if she is to continue taking apixaban (ELIQUIS) 5 MG TABS tablet.

## 2019-02-01 NOTE — Telephone Encounter (Signed)
I spoke with patient & she stated that she could not afford her Eliquis until tomorrow. She has had her morning dose, but her afternoon. I told her that I would try to see if we had a prescription drug card of some kind or I would call [facility] to see if she would qualify. I stressed that patient MUST have this medication due to pulmonary embolism.

## 2019-02-01 NOTE — Chronic Care Management (AMB) (Signed)
  Chronic Care Management   Note  02/01/2019 Name: Mary Roberts MRN: [id number] DOB: 01/02/1981  [name] is a 38 y.o. year old female who is a primary care patient of Guse, Jacquelynn Cree, FNP.   Received question regarding medication access from clinic staff today. [place] to see if the free 30 day coupon offer of Eliquis had been used, and if not, would provide information for a coupon card. However, the pharmacist noted that the copay for the patient was $0. I called patient and passed along this information, and encouraged her to continue to take Eliquis until instructed otherwise.   Catie Darnelle Maffucci, PharmD, [place] Pharmacist [facility] [place] [phone]

## 2019-02-02 LAB — NOVEL CORONAVIRUS, NAA: SARS-CoV-2, NAA: NOT DETECTED

## 2019-02-03 NOTE — Telephone Encounter (Signed)
Mary Roberts  Patient has appt with  You 02/04/19 See phone note with Catie as patient as $0 copay on refill of eliquis and was advised by Catie and Sarah to stay on medication until she speaks with you.

## 2019-02-04 ENCOUNTER — Other Ambulatory Visit: Payer: Self-pay

## 2019-02-04 ENCOUNTER — Ambulatory Visit (INDEPENDENT_AMBULATORY_CARE_PROVIDER_SITE_OTHER): Payer: BC Managed Care – PPO | Admitting: Family Medicine

## 2019-02-04 DIAGNOSIS — K219 Gastro-esophageal reflux disease without esophagitis: Secondary | ICD-10-CM

## 2019-02-04 DIAGNOSIS — Z87898 Personal history of other specified conditions: Secondary | ICD-10-CM | POA: Diagnosis not present

## 2019-02-04 DIAGNOSIS — I10 Essential (primary) hypertension: Secondary | ICD-10-CM | POA: Diagnosis not present

## 2019-02-04 DIAGNOSIS — I2699 Other pulmonary embolism without acute cor pulmonale: Secondary | ICD-10-CM | POA: Diagnosis not present

## 2019-02-04 MED ORDER — PANTOPRAZOLE SODIUM 40 MG PO TBEC: 40.0000 mg | DELAYED_RELEASE_TABLET | Freq: Every day | ORAL | 1 refills | Status: DC

## 2019-02-04 NOTE — Progress Notes (Signed)
Patient ID: Mary Roberts, female   DOB: 10-22-1980, 38 y.o.   MRN: [id number]    Virtual Visit via video Note  This visit type was conducted due to national recommendations for restrictions regarding the COVID-19 pandemic (e.g. social distancing).  This format is felt to be most appropriate for this patient at this time.  All issues noted in this document were discussed and addressed.  No physical exam was performed (except for noted visual exam findings with Video Visits).   I connected with Erasmo Score today at  3:00 PM EDT by a video enabled telemedicine application and verified that I am speaking with the correct person using two identifiers. Location patient: home Location provider: work or home office Persons participating in the virtual visit: patient, provider  I discussed the limitations, risks, security and privacy concerns of performing an evaluation and management service by telephone and the availability of in person appointments. I also discussed with the patient that there may be a patient responsible charge related to this service. The patient expressed understanding and agreed to proceed.  HPI:  Patient and I connected via video to follow-up on hypertension, pulmonary embolism.  Patient has long history of uncontrolled hypertension and did go to the emergency department for hypertensive emergency.  While in the ER she was diagnosed with a probable pulmonary embolism and started on Eliquis.  She has followed up with cardiology for better management of her blood pressure and to maximize her medication regimen.  Medications she is currently on are working well to keep the BP under great control, usually her numbers run into the 120s to 130s over 80s consistently.  Denies chest pain, shortness of breath or wheezing.  Denies lower extremity swelling.  Denies palpitations.  Overall is feeling well on current regimen. Cardiology notes reviewed.   Patient wondering if she has a  statin Eliquis for the long-term.  Patient also reports feeling of bloating and gas and upper abdomen.  States while in the hospital she is using Gas-X with mild effect, but felt she was taking too much of this.  Patient also reports a history of seizures many years ago, as far she knows has not had a seizure in a long time however does at times feel like she "spaces out". Has not seen neurology recently.   CT CHEST: IMPRESSION: 1. Probable small nonocclusive embolus within subsegmental right upper lobe branch vessel though evaluation of vessels in the region is limited by artifact arising from dense contrast within the SVC. There are no other suggested filling defects. 2. Clear lung fields  ROS:   Constitutional: Negative for chills, fatigue and fever.  HENT: Negative for congestion, ear pain, sinus pain and sore throat.   Eyes: Negative.   Respiratory: Negative for cough, shortness of breath and wheezing.   Cardiovascular: Negative for chest pain, palpitations and leg swelling.  Gastrointestinal: Negative for abdominal pain, diarrhea, nausea and vomiting. +bloating/fullness, burping  Genitourinary: Negative for dysuria, frequency and urgency.  Musculoskeletal: Negative for arthralgias and myalgias.  Skin: Negative for color change, pallor and rash.  Neurological: Negative for syncope, light-headedness and headaches.  Psychiatric/Behavioral: The patient is not nervous/anxious.     Past Medical History:  Diagnosis Date  . Anemia   . Anxiety    panic attacks  . Carpal tunnel syndrome, bilateral   . Chlamydia   . Epilepsy seizure, nonconvulsive, generalized ([place])   . Headache(784.0)    migraines  . History of C-section   . Hypertension   .  Infection    UTI  . Migraines   . Pregnancy induced hypertension    with first  . Seizures ([place])    No meds, off since in mid to late 20's  . UTI (urinary tract infection)     Past Surgical History:  Procedure Laterality Date  .  CESAREAN SECTION    . MOUTH SURGERY    . TUBAL LIGATION N/A 10/30/2013   Procedure: POST PARTUM TUBAL LIGATION;  Surgeon: [name], MD;  Location: [facility];  Service: Gynecology;  Laterality: N/A;    Family History  Problem Relation Age of Onset  . Seizures Mother   . Hypertension Mother   . Diabetes Mother   . Heart disease Mother   . Stroke Mother   . Arthritis Mother   . Seizures Brother   . Hearing loss Neg Hx     Social History   Tobacco Use  . Smoking status: Former Research scientist (life sciences)  . Smokeless tobacco: Never Used  Substance Use Topics  . Alcohol use: Yes    Comment: socially       Current Outpatient Medications:  .  amLODipine (NORVASC) 10 MG tablet, Take 1 tablet (10 mg total) by mouth daily., Disp: 30 tablet, Rfl: 6 .  apixaban (ELIQUIS) 5 MG TABS tablet, 10 mg po bid for 13 doses, then 5 mg po bid, Disp: 60 tablet, Rfl: 1 .  aspirin-acetaminophen-caffeine (EXCEDRIN MIGRAINE) 250-250-65 MG tablet, Take by mouth every 6 (six) hours as needed for headache., Disp: , Rfl:  .  chlorthalidone (HYGROTON) 25 MG tablet, Take 1 tablet (25 mg total) by mouth daily., Disp: 30 tablet, Rfl: 0 .  hydrALAZINE (APRESOLINE) 25 MG tablet, Take 1 tablet (25 mg total) by mouth every 8 (eight) hours., Disp: 270 tablet, Rfl: 2 .  norethindrone (AYGESTIN) 5 MG tablet, Take 1 tablet (5 mg total) by mouth daily., Disp: 30 tablet, Rfl: 11 .  Simethicone (GAS-X PO), Take by mouth as needed., Disp: , Rfl:  .  labetalol (NORMODYNE) 200 MG tablet, Take 2 tablets (400 mg total) by mouth 2 (two) times daily., Disp: 360 tablet, Rfl: 1  EXAM:  GENERAL: alert, oriented, appears well and in no acute distress  HEENT: atraumatic, conjunttiva clear, no obvious abnormalities on inspection of external nose and ears  NECK: normal movements of the head and neck  LUNGS: on inspection no signs of respiratory distress, breathing rate appears normal, no obvious gross SOB, gasping or wheezing  CV: no obvious  cyanosis  MS: moves all visible extremities without noticeable abnormality  PSYCH/NEURO: pleasant and cooperative, no obvious depression or anxiety, speech and thought processing grossly intact  ASSESSMENT AND PLAN:  Discussed the following assessment and plan:  Hypertension- patient will continue to work with cardiology to maximize her medication regimen and keep blood pressure under good control.  Long discussion with patient again about importance of blood pressure control for overall long-term health.  Pulmonary embolism- advised patient that due to the probability of PE being present on chest CTA, I would continue Eliquis at this time until we have confirmation of no pulmonary embolism on imaging.  Offered to repeat imaging, patient declines at this time will continue Eliquis.  History of seizure-we will get patient referred to neurology for further work-up of seizure history.  GERD- suspect patient's feelings of bloating, fullness and burping are related to over acid production.  She will begin PPI daily and monitor for improvement.   I discussed the assessment and treatment plan  with the patient. The patient was provided an opportunity to ask questions and all were answered. The patient agreed with the plan and demonstrated an understanding of the instructions.   The patient was advised to call back or seek an in-person evaluation if the symptoms worsen or if the condition fails to improve as anticipated.  Jodelle Green, FNP

## 2019-02-05 NOTE — Telephone Encounter (Signed)
FYI Pt was seen 02/04/2019 ( message from Mable Paris)  Mary Roberts Patient has appt with  You 02/04/19 See phone note with Catie as patient as $0 copay on refill of eliquis and was advised by Catie and Sarah to stay on medication until she speaks with you

## 2019-02-05 NOTE — Telephone Encounter (Signed)
Yes saw pt 8/13 and she will stay on eliquis

## 2019-02-10 ENCOUNTER — Telehealth: Payer: Self-pay | Admitting: Family Medicine

## 2019-02-10 ENCOUNTER — Other Ambulatory Visit: Payer: Self-pay | Admitting: Lab

## 2019-02-10 MED ORDER — CHLORTHALIDONE 25 MG PO TABS: 25.0000 mg | ORAL_TABLET | Freq: Every day | ORAL | 0 refills | Status: DC

## 2019-02-10 NOTE — Telephone Encounter (Signed)
Caller name: Relation to pt: self  Call back number: [phone] Pharmacy: [facility], [place] [phone] (Phone) [phone] (Fax)      Reason for call:  Patient states pharmacy advised chlorthalidone (HYGROTON) 25 MG tablet request was sent on Friday 02/05/2019 (chart doesn't reflect) patient is completely out, please advise patient when Rx is sent, patient requesting medication be expedited, please advise

## 2019-02-10 NOTE — Telephone Encounter (Signed)
Rx reordered and sent to [facility] on [address] in [place]

## 2019-02-11 ENCOUNTER — Other Ambulatory Visit: Payer: Self-pay | Admitting: Family Medicine

## 2019-02-11 ENCOUNTER — Other Ambulatory Visit: Payer: Self-pay | Admitting: *Deleted

## 2019-02-11 ENCOUNTER — Telehealth: Payer: Self-pay | Admitting: Internal Medicine

## 2019-02-11 MED ORDER — CHLORTHALIDONE 25 MG PO TABS: 25.0000 mg | ORAL_TABLET | Freq: Every day | ORAL | 0 refills | Status: DC

## 2019-02-11 NOTE — Telephone Encounter (Signed)
°*  STAT* If patient is at the pharmacy, call can be transferred to refill team.   1. Which medications need to be refilled? (please list name of each medication and dose if known) Chlorthalidone 25 mg daily  2. Which pharmacy/location (including street and city if local pharmacy) is medication to be sent to? [facility] in [place] ([address])  3. Do they need a 30 day or 90 day supply? 30  Patient has been trying to obtain medication from PCP but was instructed to call our office now.

## 2019-02-12 ENCOUNTER — Telehealth: Payer: Self-pay

## 2019-02-12 NOTE — Telephone Encounter (Signed)
[name] with [facility] called stating that patient was at pharmacy wanting to pick up a refill on chlorthalidone.  I spoke w/ Philis Nettle, NP regarding refill on prescription.  Per Ander Purpura, she is not the original prescriber for this medication.  Per Ander Purpura, patient needs to contact her cardiologist's office for refills and pt should confirm w/ cardiologist if she should still be taking meds.    Information given to Remer Macho who said she will inform patient.

## 2019-02-23 ENCOUNTER — Ambulatory Visit: Payer: Self-pay

## 2019-02-23 NOTE — Telephone Encounter (Signed)
Incoming call from Patient Reporting that she forgot to take her morning medications. Provided information on how to catch up on doses.  Patient voices understanding.   Answer Assessment - Initial Assessment Questions 1.   NAME of MEDICATION: "What medicine are you calling about?"    Forgot to medication  2.   QUESTION: "What is your question?"    Forgot to take medication this morning.  3.   PRESCRIBING HCP: "Who prescribed it?" Reason: if prescribed by specialist, call should be referred to that group.     *No Answer* 4. SYMPTOMS: "Do you have any symptoms?"     none 5. SEVERITY: If symptoms are present, ask "Are they mild, moderate or severe?"    na 6.  PREGNANCY:  "Is there any chance that you are pregnant?" "When was your last menstrual period?"    na  Protocols used: MEDICATION QUESTION CALL-A-AH

## 2019-02-25 ENCOUNTER — Encounter: Payer: Self-pay | Admitting: Nurse Practitioner

## 2019-02-25 ENCOUNTER — Other Ambulatory Visit: Payer: Self-pay

## 2019-02-25 ENCOUNTER — Ambulatory Visit (INDEPENDENT_AMBULATORY_CARE_PROVIDER_SITE_OTHER): Payer: BC Managed Care – PPO | Admitting: Nurse Practitioner

## 2019-02-25 VITALS — BP 125/81 | HR 66 | Ht 62.5 in | Wt 181.5 lb

## 2019-02-25 DIAGNOSIS — I10 Essential (primary) hypertension: Secondary | ICD-10-CM

## 2019-02-25 DIAGNOSIS — I2699 Other pulmonary embolism without acute cor pulmonale: Secondary | ICD-10-CM

## 2019-02-25 MED ORDER — CHLORTHALIDONE 25 MG PO TABS: 25.0000 mg | ORAL_TABLET | Freq: Every day | ORAL | 6 refills | Status: DC

## 2019-02-25 MED ORDER — HYDRALAZINE HCL 25 MG PO TABS: ORAL_TABLET | ORAL | 6 refills | Status: DC

## 2019-02-25 NOTE — Patient Instructions (Signed)
Medication Instructions:  - Your physician has recommended you make the following change in your medication:   1) Decrease hydralazine to 25 mg- take 1 tablet by mouth twice daily  If you need a refill on your cardiac medications before your next appointment, please call your pharmacy.   Lab work: - Your physician recommends that you have lab work: [employer] - Please go to [employer], 1st desk on the right to check in (Monday - Friday 7:30 am- 5:30 pm)  If you have labs (blood work) drawn today and your tests are completely normal, you will receive your results only by: Marland Kitchen MyChart Message (if you have MyChart) OR . A paper copy in the mail If you have any lab test that is abnormal or we need to change your treatment, we will call you to review the results.  Testing/Procedures: - none ordered  Follow-Up: At [facility], you and your health needs are our priority.  As part of our continuing mission to provide you with exceptional heart care, we have created designated Provider Care Teams.  These Care Teams include your primary Cardiologist (physician) and Advanced Practice Providers (APPs -  Physician Assistants and Nurse Practitioners) who all work together to provide you with the care you need, when you need it. . in 6 weeks with Dr. Saunders Revel.  Any Other Special Instructions Will Be Listed Below (If Applicable). - N/A

## 2019-02-25 NOTE — Progress Notes (Signed)
Office Visit    Patient Name: Mary Roberts Date of Encounter: 02/25/2019  Primary Care Provider:  Jodelle Green, FNP Primary Cardiologist:  Nelva Bush, MD  Chief Complaint    38 y/o ? w/ a h/o uncontrolled HTN, sz d/o, anemia, anxiety attacks, and pre-eclampsia, who presents for follow-up of hypertension.  Past Medical History    Past Medical History:  Diagnosis Date  . Anemia   . Anxiety    panic attacks  . Carpal tunnel syndrome, bilateral   . Chlamydia   . Epilepsy seizure, nonconvulsive, generalized ([place])   . Headache(784.0)    migraines  . History of C-section   . Hypertension   . Infection    UTI  . Migraines   . Pregnancy induced hypertension    with first  . Seizures ([place])    No meds, off since in mid to late 20's  . UTI (urinary tract infection)    Past Surgical History:  Procedure Laterality Date  . CESAREAN SECTION    . MOUTH SURGERY    . TUBAL LIGATION N/A 10/30/2013   Procedure: POST PARTUM TUBAL LIGATION;  Surgeon: [name], MD;  Location: [facility];  Service: Gynecology;  Laterality: N/A;    Allergies  Allergies  Allergen Reactions  . Amoxicillin Hives and Other (See Comments)    Has patient had a PCN reaction causing immediate rash, facial/tongue/throat swelling, SOB or lightheadedness with hypotension: No Has patient had a PCN reaction causing severe rash involving mucus membranes or skin necrosis: No Has patient had a PCN reaction that required hospitalization: No Has patient had a PCN reaction occurring within the last 10 years: Yes If all of the above answers are "NO", then may proceed with Cephalosporin use.     History of Present Illness    38 year old female with the above past medical history including hypertension, seizure disorder, anemia, anxiety attacks, and preeclampsia.  She was seen by her primary care provider in July at which time she was markedly hypertensive.  She was in the ED 3 days later with continued  uncontrolled hypertension as well as chest pain or shortness of breath.  CTA of the chest showed probable nonocclusive PE within segmental right upper lobe branch vessel.  She was admitted for anticoagulation and blood pressure control.  Echo showed normal LV function with diastolic dysfunction and severe LVH.  [facility] as [facility] and subsequently discharged home on amlodipine, chlorthalidone, hydralazine, labetalol, and apixaban.  She followed up with Dr. Saunders Revel in our clinic on July 23, and reported improved blood pressure readings at home.  In an effort to consolidate her medications and wean her hydralazine, amlodipine was increased to 10 mg daily while hydralazine was dropped to 25 mg 3 times daily.  Consideration was given to work-up of secondary causes of hypertension (renal artery Doppler, serum aldosterone, plasma renin activity, and urine catecholamines/metanephrines) though ultimately this was deferred.  She was advised to avoid progestins.  Since her last visit, she has done reasonably well.  Blood pressures at home have been much improved on her current regimen and typically trend in the 120s or below.  She occasionally notes orthostatic lightheadedness.  In the setting of Eliquis therapy she did have a rather heavy period lasting 7 days in August.  She is no longer taking norethindrone.  She denies chest pain, dyspnea, palpitations, PND, orthopnea, dizziness, syncope, edema, or early satiety.  Home Medications    Prior  to Admission medications   Medication Sig Start Date End Date Taking? Authorizing Provider  amLODipine (NORVASC) 10 MG tablet Take 1 tablet (10 mg total) by mouth daily. 01/14/19 04/14/19 Yes End, Harrell Gave, MD  apixaban (ELIQUIS) 5 MG TABS tablet 10 mg po bid for 13 doses, then 5 mg po bid 01/08/19  Yes Demetrios Loll, MD  aspirin-acetaminophen-caffeine [facility] MIGRAINE) [phone] MG tablet Take by mouth every 6 (six) hours as needed for headache.    Yes [provider]  chlorthalidone (HYGROTON) 25 MG tablet Take 1 tablet (25 mg total) by mouth daily. 02/11/19  Yes End, Harrell Gave, MD  hydrALAZINE (APRESOLINE) 25 MG tablet Take 1 tablet (25 mg total) by mouth every 8 (eight) hours. 01/14/19 02/25/19 Yes End, Harrell Gave, MD  labetalol (NORMODYNE) 200 MG tablet Take 2 tablets (400 mg total) by mouth 2 (two) times daily. 10/13/18 02/25/19 Yes Guse, Jacquelynn Cree, FNP  norethindrone (AYGESTIN) 5 MG tablet Take 1 tablet (5 mg total) by mouth daily. 07/15/18  Yes Will Bonnet, MD  pantoprazole (PROTONIX) 40 MG tablet Take 1 tablet (40 mg total) by mouth daily. 02/04/19  Yes Guse, Jacquelynn Cree, FNP  Simethicone (GAS-X PO) Take by mouth as needed.   Yes [provider]    Review of Systems    She denies chest pain, palpitations, dyspnea, pnd, orthopnea, n, v, dizziness, syncope, edema, weight gain, or early satiety.  Heavy menses in August.  All other systems reviewed and are otherwise negative except as noted above.  Physical Exam    VS:  BP 125/81 (BP Location: Right Arm, Patient Position: Sitting, Cuff Size: Normal)   Pulse 66   Ht 5' 2.5" (1.588 m)   Wt 181 lb 8 oz (82.3 kg)   SpO2 96%   BMI 32.67 kg/m  , BMI Body mass index is 32.67 kg/m.  Orthostatic VS for the past 24 hrs:  BP- Lying Pulse- Lying BP- Sitting Pulse- Sitting BP- Standing at 0 minutes Pulse- Standing at 0 minutes  02/25/19 1523 120/78 68 123/77 67 109/68 74    GEN: Well nourished, well developed, in no acute distress. HEENT: normal. Neck: Supple, no JVD, carotid bruits, or masses. Cardiac: RRR, no murmurs, rubs, or gallops. No clubbing, cyanosis, edema.  Radials/PT 2+ and equal bilaterally.  Respiratory:  Respirations regular and unlabored, clear to auscultation bilaterally. GI: Soft, nontender, nondistended, BS + x 4. MS: no deformity or atrophy. Skin: warm and dry, no rash. Neuro:  Strength and sensation are intact. Psych: Normal affect.  Accessory  Clinical Findings    ECG personally reviewed by me today -regular sinus rhythm, 66, delayed R wave progression- no acute changes.  Lab Results  Component Value Date   WBC 6.8 01/08/2019   HGB 11.1 (L) 01/08/2019   HCT 34.2 (L) 01/08/2019   MCV 87.9 01/08/2019   PLT 266 01/08/2019   Lab Results  Component Value Date   CREATININE 0.93 01/08/2019   BUN 11 01/08/2019   NA 135 01/08/2019   K 3.3 (L) 01/08/2019   CL 104 01/08/2019   CO2 22 01/08/2019   Lab Results  Component Value Date   ALT 12 01/07/2019   AST 16 01/07/2019   ALKPHOS 37 (L) 01/07/2019   BILITOT 0.7 01/07/2019    Assessment & Plan    1.  Essential hypertension: Blood pressure has remained stable since her last visit following reduction of hydralazine to 25 mg 3 times daily and titration of amlodipine to 10 mg daily.  She is also on chlorthalidone 25 mg daily and labetalol 200 mg twice daily.  She has had occasional lightheadedness and is mildly orthostatic after standing today.  We discussed a goal of trying to reduce her hydralazine and in the setting of mild orthostasis, I am going to reduce her to 25 mg twice daily.  She will continue to follow-up her blood pressure at home and contact us if she develops recurrent hypertension or periodic spikes.  Otherwise, we will plan to see her back in about 6 weeks and see if we can wean her from hydralazine further.  Follow-up basic metabolic panel today as she is in need of a refill on her chlorthalidone.  With significant improvement in blood pressure on medical therapy, will continue to defer work-up for secondary hypertension.  2.  Pulmonary embolism: Small segmental PE suspected during hospitalization in July.  She remains on Eliquis therapy and has been compliant.  She is now off of Aygestin and plans to make an appointment with her gynecologist to discuss potential alternative therapies with less risk of thrombosis.  She has noted heavier menses off of Aygestin.  3.  Left  ventricular hypertrophy/abnormal EKG: No history of chest pain and reasonable activity tolerance.  No plan for ischemic testing at this time.  4.  Disposition: Follow-up basic metabolic panel today.  Follow-up in clinic in 6 weeks or sooner if necessary.   Murray Hodgkins, NP 02/25/2019, 5:47 PM

## 2019-03-03 ENCOUNTER — Observation Stay
Admission: EM | Admit: 2019-03-03 | Discharge: 2019-03-04 | Disposition: A | Discharge status: Home / Self Care | Payer: BC Managed Care – PPO | Attending: Internal Medicine | Admitting: Internal Medicine

## 2019-03-03 ENCOUNTER — Other Ambulatory Visit: Payer: Self-pay

## 2019-03-03 DIAGNOSIS — Z880 Allergy status to penicillin: Secondary | ICD-10-CM | POA: Diagnosis not present

## 2019-03-03 DIAGNOSIS — N920 Excessive and frequent menstruation with regular cycle: Principal | ICD-10-CM | POA: Insufficient documentation

## 2019-03-03 DIAGNOSIS — D649 Anemia, unspecified: Secondary | ICD-10-CM | POA: Diagnosis not present

## 2019-03-03 DIAGNOSIS — I10 Essential (primary) hypertension: Secondary | ICD-10-CM | POA: Insufficient documentation

## 2019-03-03 DIAGNOSIS — Z0184 Encounter for antibody response examination: Secondary | ICD-10-CM | POA: Diagnosis not present

## 2019-03-03 DIAGNOSIS — Z79899 Other long term (current) drug therapy: Secondary | ICD-10-CM | POA: Diagnosis not present

## 2019-03-03 DIAGNOSIS — Z87891 Personal history of nicotine dependence: Secondary | ICD-10-CM | POA: Diagnosis not present

## 2019-03-03 DIAGNOSIS — R42 Dizziness and giddiness: Secondary | ICD-10-CM | POA: Diagnosis not present

## 2019-03-03 DIAGNOSIS — Z8249 Family history of ischemic heart disease and other diseases of the circulatory system: Secondary | ICD-10-CM | POA: Diagnosis not present

## 2019-03-03 DIAGNOSIS — I2699 Other pulmonary embolism without acute cor pulmonale: Secondary | ICD-10-CM | POA: Diagnosis not present

## 2019-03-03 DIAGNOSIS — Z86711 Personal history of pulmonary embolism: Secondary | ICD-10-CM | POA: Insufficient documentation

## 2019-03-03 DIAGNOSIS — Z7901 Long term (current) use of anticoagulants: Secondary | ICD-10-CM | POA: Insufficient documentation

## 2019-03-03 DIAGNOSIS — M792 Neuralgia and neuritis, unspecified: Secondary | ICD-10-CM | POA: Diagnosis not present

## 2019-03-03 DIAGNOSIS — Z20828 Contact with and (suspected) exposure to other viral communicable diseases: Secondary | ICD-10-CM | POA: Diagnosis not present

## 2019-03-03 DIAGNOSIS — E876 Hypokalemia: Secondary | ICD-10-CM | POA: Diagnosis not present

## 2019-03-03 DIAGNOSIS — R55 Syncope and collapse: Secondary | ICD-10-CM | POA: Diagnosis not present

## 2019-03-03 LAB — COMPREHENSIVE METABOLIC PANEL
ALT: 12 U/L (ref 0–44)
AST: 16 U/L (ref 15–41)
Albumin: 3.9 g/dL (ref 3.5–5.0)
Alkaline Phosphatase: 33 U/L — ABNORMAL LOW (ref 38–126)
Anion gap: 10 (ref 5–15)
BUN: 19 mg/dL (ref 6–20)
CO2: 27 mmol/L (ref 22–32)
Calcium: 9.1 mg/dL (ref 8.9–10.3)
Chloride: 101 mmol/L (ref 98–111)
Creatinine, Ser: 1.21 mg/dL — ABNORMAL HIGH (ref 0.44–1.00)
GFR calc Af Amer: >60 mL/min (ref >60)
GFR calc non Af Amer: 56 mL/min — ABNORMAL LOW (ref >60)
Glucose, Bld: 101 mg/dL — ABNORMAL HIGH (ref 70–99)
Potassium: 2.9 mmol/L — ABNORMAL LOW (ref 3.5–5.1)
Sodium: 138 mmol/L (ref 135–145)
Total Bilirubin: 0.6 mg/dL (ref 0.3–1.2)
Total Protein: 7.3 g/dL (ref 6.5–8.1)

## 2019-03-03 LAB — CBC
HCT: 20.5 % — ABNORMAL LOW (ref 36.0–46.0)
Hemoglobin: 6.5 g/dL — ABNORMAL LOW (ref 12.0–15.0)
MCH: 27.9 pg (ref 26.0–34.0)
MCHC: 31.7 g/dL (ref 30.0–36.0)
MCV: 88 fL (ref 80.0–100.0)
Platelets: 221 10*3/uL (ref 150–400)
RBC: 2.33 MIL/uL — ABNORMAL LOW (ref 3.87–5.11)
RDW: 13.7 % (ref 11.5–15.5)
WBC: 6 10*3/uL (ref 4.0–10.5)
nRBC: 0 % (ref 0.0–0.2)

## 2019-03-03 LAB — MAGNESIUM: Magnesium: 1.7 mg/dL (ref 1.7–2.4)

## 2019-03-03 LAB — PREPARE RBC (CROSSMATCH)

## 2019-03-03 LAB — POCT PREGNANCY, URINE: Preg Test, Ur: NEGATIVE

## 2019-03-03 MED ORDER — CHLORTHALIDONE 25 MG PO TABS
25.0000 mg | ORAL_TABLET | Freq: Once | ORAL | Status: DC
  Filled 2019-03-03: qty 1

## 2019-03-03 MED ORDER — SODIUM CHLORIDE 0.9 % IV SOLN
1000.0000 mL | Freq: Once | INTRAVENOUS | Status: AC
  Administered 2019-03-03: 1000 mL via INTRAVENOUS

## 2019-03-03 MED ORDER — ONDANSETRON HCL 4 MG PO TABS: 4.0000 mg | ORAL_TABLET | Freq: Four times a day (QID) | ORAL | Status: DC | PRN

## 2019-03-03 MED ORDER — LABETALOL HCL 200 MG PO TABS
400.0000 mg | ORAL_TABLET | Freq: Two times a day (BID) | ORAL | Status: DC
  Filled 2019-03-03 (×3): qty 2

## 2019-03-03 MED ORDER — SODIUM CHLORIDE 0.9 % IV SOLN: 10.0000 mL/h | Freq: Once | INTRAVENOUS | Status: DC

## 2019-03-03 MED ORDER — CHLORTHALIDONE 25 MG PO TABS
25.0000 mg | ORAL_TABLET | Freq: Every day | ORAL | Status: DC
  Filled 2019-03-03: qty 1

## 2019-03-03 MED ORDER — HYDRALAZINE HCL 10 MG PO TABS
10.0000 mg | ORAL_TABLET | Freq: Three times a day (TID) | ORAL | Status: DC
  Administered 2019-03-03: 22:00:00 10 mg via ORAL
  Filled 2019-03-03 (×4): qty 1

## 2019-03-03 MED ORDER — LABETALOL HCL 200 MG PO TABS
400.0000 mg | ORAL_TABLET | Freq: Once | ORAL | Status: DC
  Filled 2019-03-03: qty 2

## 2019-03-03 MED ORDER — ALBUTEROL SULFATE (2.5 MG/3ML) 0.083% IN NEBU: 2.5000 mg | INHALATION_SOLUTION | RESPIRATORY_TRACT | Status: DC | PRN

## 2019-03-03 MED ORDER — ACETAMINOPHEN 650 MG RE SUPP: 650.0000 mg | Freq: Four times a day (QID) | RECTAL | Status: DC | PRN

## 2019-03-03 MED ORDER — ONDANSETRON HCL 4 MG/2ML IJ SOLN: 4.0000 mg | Freq: Four times a day (QID) | INTRAMUSCULAR | Status: DC | PRN

## 2019-03-03 MED ORDER — ACETAMINOPHEN 325 MG PO TABS: 650.0000 mg | ORAL_TABLET | Freq: Four times a day (QID) | ORAL | Status: DC | PRN

## 2019-03-03 MED ORDER — POTASSIUM CHLORIDE CRYS ER 20 MEQ PO TBCR
40.0000 meq | EXTENDED_RELEASE_TABLET | ORAL | Status: AC
  Administered 2019-03-03: 22:00:00 40 meq via ORAL
  Filled 2019-03-03: qty 2

## 2019-03-03 MED ORDER — POLYETHYLENE GLYCOL 3350 17 G PO PACK: 17.0000 g | PACK | Freq: Every day | ORAL | Status: DC | PRN

## 2019-03-03 MED ORDER — APIXABAN 5 MG PO TABS
5.0000 mg | ORAL_TABLET | Freq: Two times a day (BID) | ORAL | Status: DC
  Administered 2019-03-03: 22:00:00 5 mg via ORAL
  Filled 2019-03-03 (×2): qty 1

## 2019-03-03 NOTE — ED Triage Notes (Signed)
Patient brought by EMS from home for dizziness and near syncope.  She is on Eloquis and Hydralazine.  Hx of blood clot in lung.  Currently on menstrual cycle.  Hgb is low every 4th-5th cycle.    When she sits up or stands she gets dizziness and starts to see black.    110/76 when EMS arrived, glucose 142, T 98.5, SpO2 99.  ECG performed.  Amoxicillin allergy.

## 2019-03-03 NOTE — H&P (Signed)
[facility] NAME: Mary Roberts    MR#:  [id number]  DATE OF BIRTH:  05/17/1981  DATE OF ADMISSION:  03/03/2019  PRIMARY CARE PHYSICIAN: Jodelle Green, FNP   REQUESTING/REFERRING PHYSICIAN: Dr. Corky Downs  CHIEF COMPLAINT:   Chief Complaint  Patient presents with  . Near Syncope  . Dizziness    HISTORY OF PRESENT ILLNESS:  Mary Roberts  is a 38 y.o. female with a known history of menorrhagia, hypertension, PE in July 2020 on Eliquis presents to the hospital due to extreme fatigue, dizziness on standing up.  Her hemoglobin has been found to be 6.  She has been working with gynecology due to menorrhagia and was started on hormone pills.  After this she was found to have a small PE and started on Eliquis in July.  Since then her menorrhagia has been worse.  She continues to follow with gynecology and is supposed to be on iron pills that she takes infrequently.  Patient is being admitted to the hospital under observation for symptomatic anemia. No blood in stool or black stools  PAST MEDICAL HISTORY:   Past Medical History:  Diagnosis Date  . Anemia   . Anxiety    panic attacks  . Carpal tunnel syndrome, bilateral   . Chlamydia   . Epilepsy seizure, nonconvulsive, generalized ([place])   . Headache(784.0)    migraines  . History of C-section   . Hypertension   . Infection    UTI  . Migraines   . Pregnancy induced hypertension    with first  . Seizures ([place])    No meds, off since in mid to late 20's  . UTI (urinary tract infection)     PAST SURGICAL HISTORY:   Past Surgical History:  Procedure Laterality Date  . CESAREAN SECTION    . MOUTH SURGERY    . TUBAL LIGATION N/A 10/30/2013   Procedure: POST PARTUM TUBAL LIGATION;  Surgeon: [name], MD;  Location: [facility];  Service: Gynecology;  Laterality: N/A;    SOCIAL HISTORY:   Social History   Tobacco Use  . Smoking status: Former Research scientist (life sciences)  . Smokeless tobacco:  Never Used  Substance Use Topics  . Alcohol use: Yes    Comment: socially     FAMILY HISTORY:   Family History  Problem Relation Age of Onset  . Seizures Mother   . Hypertension Mother   . Diabetes Mother   . Heart disease Mother   . Stroke Mother   . Arthritis Mother   . Seizures Brother   . Hearing loss Neg Hx     DRUG ALLERGIES:   Allergies  Allergen Reactions  . Amoxicillin Hives and Other (See Comments)    Has patient had a PCN reaction causing immediate rash, facial/tongue/throat swelling, SOB or lightheadedness with hypotension: No Has patient had a PCN reaction causing severe rash involving mucus membranes or skin necrosis: No Has patient had a PCN reaction that required hospitalization: No Has patient had a PCN reaction occurring within the last 10 years: Yes If all of the above answers are "NO", then may proceed with Cephalosporin use.     REVIEW OF SYSTEMS:   Review of Systems  Constitutional: Positive for malaise/fatigue. Negative for chills and fever.  HENT: Negative for sore throat.   Eyes: Negative for blurred vision, double vision and pain.  Respiratory: Negative for cough, hemoptysis, shortness of breath and wheezing.   Cardiovascular: Negative for  chest pain, palpitations, orthopnea and leg swelling.  Gastrointestinal: Negative for abdominal pain, constipation, diarrhea, heartburn, nausea and vomiting.  Genitourinary: Negative for dysuria and hematuria.  Musculoskeletal: Negative for back pain and joint pain.  Skin: Negative for rash.  Neurological: Positive for dizziness. Negative for sensory change, speech change, focal weakness and headaches.  Endo/Heme/Allergies: Does not bruise/bleed easily.  Psychiatric/Behavioral: Negative for depression. The patient is not nervous/anxious.     MEDICATIONS AT HOME:   Prior to Admission medications   Medication Sig Start Date End Date Taking? Authorizing Provider  amLODipine (NORVASC) 10 MG tablet Take 1  tablet (10 mg total) by mouth daily. 01/14/19 04/14/19 Yes End, Harrell Gave, MD  apixaban (ELIQUIS) 5 MG TABS tablet 10 mg po bid for 13 doses, then 5 mg po bid Patient taking differently: Take 5 mg by mouth 2 (two) times daily.  01/08/19  Yes Demetrios Loll, MD  aspirin-acetaminophen-caffeine [facility] MIGRAINE) [phone] MG tablet Take by mouth every 6 (six) hours as needed for headache.   Yes [provider]  chlorthalidone (HYGROTON) 25 MG tablet Take 1 tablet (25 mg total) by mouth daily. 02/25/19  Yes Theora Gianotti, NP  hydrALAZINE (APRESOLINE) 25 MG tablet Take 1 tablet (25 mg) by mouth twice daily 02/25/19  Yes Theora Gianotti, NP  labetalol (NORMODYNE) 200 MG tablet Take 2 tablets (400 mg total) by mouth 2 (two) times daily. 10/13/18 03/03/19 Yes Guse, Jacquelynn Cree, FNP  Simethicone (GAS-X PO) Take by mouth as needed.   Yes [provider]     VITAL SIGNS:  Blood pressure 130/73, pulse 79, temperature 98.5 F (36.9 C), temperature source Oral, resp. rate 16, height 5' 2.5" (1.588 m), weight 82.3 kg, SpO2 100 %.  PHYSICAL EXAMINATION:  Physical Exam  GENERAL:  38 y.o.-year-old patient lying in the bed with no acute distress.  EYES: Pupils equal, round, reactive to light and accommodation. No scleral icterus. Extraocular muscles intact.  HEENT: Head atraumatic, normocephalic. Oropharynx and nasopharynx clear. No oropharyngeal erythema, moist oral mucosa  NECK:  Supple, no jugular venous distention. No thyroid enlargement, no tenderness.  LUNGS: Normal breath sounds bilaterally, no wheezing, rales, rhonchi. No use of accessory muscles of respiration.  CARDIOVASCULAR: S1, S2 normal. No murmurs, rubs, or gallops.  ABDOMEN: Soft, nontender, nondistended. Bowel sounds present. No organomegaly or mass.  EXTREMITIES: No pedal edema, cyanosis, or clubbing. + 2 pedal & radial pulses b/l.   NEUROLOGIC: Cranial nerves II through XII are intact. No focal Motor or sensory  deficits appreciated b/l PSYCHIATRIC: The patient is alert and oriented x 3. Good affect.  SKIN: No obvious rash, lesion, or ulcer.   LABORATORY PANEL:   CBC Recent Labs  Lab 03/03/19 1434  WBC 6.0  HGB 6.5*  HCT 20.5*  PLT 221   ------------------------------------------------------------------------------------------------------------------  Chemistries  Recent Labs  Lab 03/03/19 1434  NA 138  K 2.9*  CL 101  CO2 27  GLUCOSE 101*  BUN 19  CREATININE 1.21*  CALCIUM 9.1  AST 16  ALT 12  ALKPHOS 33*  BILITOT 0.6   ------------------------------------------------------------------------------------------------------------------  Cardiac Enzymes No results for input(s): TROPONINI in the last 168 hours. ------------------------------------------------------------------------------------------------------------------  RADIOLOGY:  No results found.   IMPRESSION AND PLAN:   *Acute on chronic anemia secondary to menorrhagia.  Will transfuse 1 unit of packed RBC due to symptomatic anemia and hemoglobin 6.5. If patient has improved hemoglobin and symptoms she can be discharged home tomorrow.  Repeat hemoglobin in the morning.  *Neuralgia.  Will need outpatient gynecology follow-up.  *Pulmonary embolism in July 2020.  Continue Eliquis.  Follows with cardiology Dr. Saunders Revel.  *Hypokalemia.  Will replace orally.  Will need potassium supplements at discharge as she is on thiazide diuretic for her blood pressure.  *Hypertension.  Continue home medications.  DVT prophylaxis.  On Eliquis.  All the records are reviewed and case discussed with ED provider. Management plans discussed with the patient, family and they are in agreement.  CODE STATUS: Full code  TOTAL TIME TAKING CARE OF THIS PATIENT: 45 minutes.   Leia Alf Fatoumata Albaugh M.D on 03/03/2019 at 4:41 PM  Between 7am to 6pm - Pager - [phone]  After 6pm go to [email or web address] - password [email or web address]  Office  [phone]  CC: Primary care physician; Jodelle Green, FNP  Note: This dictation was prepared with Dragon dictation along with smaller phrase technology. Any transcriptional errors that result from this process are unintentional.

## 2019-03-03 NOTE — ED Notes (Signed)
Patient refusing coronavirus testing.  Dr. Corky Downs alerted.

## 2019-03-03 NOTE — ED Notes (Signed)
Patient refused COVID-19 swab. MD notified. Patient asymptomatic. MD okayed transfer of patient to floor.

## 2019-03-03 NOTE — ED Provider Notes (Signed)
[facility] Emergency Department Provider Note   ____________________________________________    I have reviewed the triage vital signs and the nursing notes.   HISTORY  Chief Complaint Near Syncope and Dizziness     HPI Mary Roberts is a 38 y.o. female who presents with complaints of near syncope and dizziness.  Patient reports she is on day 3 of her menstrual cycle.  She has required transfusions in the past for very heavy menstruation.  She was tried on hormones but then developed a DVT for which she is now on Eliquis.  She reports that she feels syncopal whenever she stands up today.  Denies chest pain or palpitations.  Reports normal period Cramps.  Has required blood transfusion in the past  Past Medical History:  Diagnosis Date  . Anemia   . Anxiety    panic attacks  . Carpal tunnel syndrome, bilateral   . Chlamydia   . Epilepsy seizure, nonconvulsive, generalized ([place])   . Headache(784.0)    migraines  . History of C-section   . Hypertension   . Infection    UTI  . Migraines   . Pregnancy induced hypertension    with first  . Seizures ([place])    No meds, off since in mid to late 20's  . UTI (urinary tract infection)     Patient Active Problem List   Diagnosis Date Noted  . Acute pulmonary embolism without acute cor pulmonale ([place]) 01/14/2019  . Hypertensive urgency 01/07/2019  . Menorrhagia with regular cycle 07/15/2018  . Anemia associated with acute blood loss 07/15/2018  . Uncontrolled hypertension 02/01/2018  . Anemia due to chronic blood loss 07/14/2016  . Bilateral pneumonia 07/14/2016  . HTN (hypertension) 07/14/2016  . Acute hypokalemia 07/14/2016  . Heavy menstrual bleeding 07/14/2016  . Intertrochanteric fracture of left femur ([place]) 07/14/2016  . Carpal tunnel syndrome, bilateral   . Pre-eclampsia, delivered 10/29/2013  . History of seizures/last episode >15 yrs ago 10/28/2013  . History of Severe pre-eclampsia  10/28/2013  . Previous cesarean delivery, antepartum condition or complication [id number]  . Severe Preeclampsia superimposed on pre-existing hypertension, antepartum 04/29/2013  . Supervision of high risk pregnancy in second trimester 04/29/2013    Past Surgical History:  Procedure Laterality Date  . CESAREAN SECTION    . MOUTH SURGERY    . TUBAL LIGATION N/A 10/30/2013   Procedure: POST PARTUM TUBAL LIGATION;  Surgeon: [name], MD;  Location: [facility];  Service: Gynecology;  Laterality: N/A;    Prior to Admission medications   Medication Sig Start Date End Date Taking? Authorizing Provider  amLODipine (NORVASC) 10 MG tablet Take 1 tablet (10 mg total) by mouth daily. 01/14/19 04/14/19  End, Harrell Gave, MD  apixaban (ELIQUIS) 5 MG TABS tablet 10 mg po bid for 13 doses, then 5 mg po bid 01/08/19   Demetrios Loll, MD  aspirin-acetaminophen-caffeine [facility] MIGRAINE) [phone] MG tablet Take by mouth every 6 (six) hours as needed for headache.    [provider]  chlorthalidone (HYGROTON) 25 MG tablet Take 1 tablet (25 mg total) by mouth daily. 02/25/19   Theora Gianotti, NP  hydrALAZINE (APRESOLINE) 25 MG tablet Take 1 tablet (25 mg) by mouth twice daily 02/25/19   Theora Gianotti, NP  labetalol (NORMODYNE) 200 MG tablet Take 2 tablets (400 mg total) by mouth 2 (two) times daily. 10/13/18 02/25/19  Jodelle Green, FNP  pantoprazole (PROTONIX) 40 MG tablet Take 1 tablet (40 mg total)  by mouth daily. 02/04/19   Jodelle Green, FNP  Simethicone (GAS-X PO) Take by mouth as needed.    [provider]     Allergies Amoxicillin  Family History  Problem Relation Age of Onset  . Seizures Mother   . Hypertension Mother   . Diabetes Mother   . Heart disease Mother   . Stroke Mother   . Arthritis Mother   . Seizures Brother   . Hearing loss Neg Hx     Social History Social History   Tobacco Use  . Smoking status: Former Research scientist (life sciences)  . Smokeless tobacco: Never  Used  Substance Use Topics  . Alcohol use: Yes    Comment: socially   . Drug use: No    Review of Systems  Constitutional: No fever/chills Eyes: No visual changes.  ENT: No sore throat. Cardiovascular: Denies chest pain. Respiratory: Denies shortness of breath. Gastrointestinal: No abdominal pain.  No nausea, no vomiting.   Genitourinary: As above Musculoskeletal: Negative for back pain. Skin: Negative for rash. Neurological: Negative for headaches    ____________________________________________   PHYSICAL EXAM:  VITAL SIGNS: ED Triage Vitals  Enc Vitals Group     BP 03/03/19 1150 126/71     Pulse Rate 03/03/19 1150 71     Resp 03/03/19 1150 18     Temp 03/03/19 1150 98.5 F (36.9 C)     Temp Source 03/03/19 1150 Oral     SpO2 03/03/19 1150 100 %     Weight 03/03/19 1151 82.3 kg (181 lb 8 oz)     Height 03/03/19 1151 1.588 m (5' 2.5")     Head Circumference --      Peak Flow --      Pain Score --      Pain Loc --      Pain Edu? --      Excl. in [place]? --     Constitutional: Alert and oriented.  Eyes: Conjunctivae are normal.   Nose: No congestion/rhinnorhea. Mouth/Throat: Mucous membranes are moist.    Cardiovascular: Normal rate, regular rhythm. Grossly normal heart sounds.  Good peripheral circulation. Respiratory: Normal respiratory effort.  No retractions.  Gastrointestinal: Soft and nontender. No distention.  No CVA tenderness. Genitourinary: deferred Musculoskeletal: No lower extremity tenderness nor edema.  Warm and well perfused Neurologic:  Normal speech and language. No gross focal neurologic deficits are appreciated.  Skin:  Skin is warm, dry and intact. No rash noted. Psychiatric: Mood and affect are normal. Speech and behavior are normal.  ____________________________________________   LABS (all labs ordered are listed, but only abnormal results are displayed)  Labs Reviewed  CBC - Abnormal; Notable for the following components:       Result Value   RBC 2.33 (*)    Hemoglobin 6.5 (*)    HCT 20.5 (*)    All other components within normal limits  COMPREHENSIVE METABOLIC PANEL - Abnormal; Notable for the following components:   Potassium 2.9 (*)    Glucose, Bld 101 (*)    Creatinine, Ser 1.21 (*)    Alkaline Phosphatase 33 (*)    GFR calc non Af Amer 56 (*)    All other components within normal limits  SARS CORONAVIRUS 2 (TAT 6-24 HRS)  TYPE AND SCREEN  PREPARE RBC (CROSSMATCH)   ____________________________________________  EKG  ED ECG REPORT I, Lavonia Drafts, the attending physician, personally viewed and interpreted this ECG.  Date: 03/03/2019  Rhythm: normal sinus rhythm QRS Axis: normal Intervals: normal  ST/T Wave abnormalities: normal Narrative Interpretation: no evidence of acute ischemia  ____________________________________________  RADIOLOGY  None ____________________________________________   PROCEDURES  Procedure(s) performed: yes  Angiocath insertion Performed by: Lavonia Drafts  Consent: Verbal consent obtained. Risks and benefits: risks, benefits and alternatives were discussed Time out: Immediately prior to procedure a "time out" was called to verify the correct patient, procedure, equipment, support staff and site/side marked as required.  Preparation: Patient was prepped and draped in the usual sterile fashion.  Vein Location: Left AC  Ultrasound Guided  Gauge: 18  Normal blood return and flush without difficulty Patient tolerance: Patient tolerated the procedure well with no immediate complications.     Procedures   Critical Care performed: yes  CRITICAL CARE Performed by: Lavonia Drafts   Total critical care time: 30 minutes  Critical care time was exclusive of separately billable procedures and treating other patients.  Critical care was necessary to treat or prevent imminent or life-threatening deterioration.  Critical care was time spent personally by  me on the following activities: development of treatment plan with patient and/or surrogate as well as nursing, discussions with consultants, evaluation of patient's response to treatment, examination of patient, obtaining history from patient or surrogate, ordering and performing treatments and interventions, ordering and review of laboratory studies, ordering and review of radiographic studies, pulse oximetry and re-evaluation of patient's condition.  ____________________________________________   INITIAL IMPRESSION / ASSESSMENT AND PLAN / ED COURSE  Pertinent labs & imaging results that were available during my care of the patient were reviewed by me and considered in my medical decision making (see chart for details).  Patient well-appearing and in no acute distress, vital signs are reassuring.  Given her history of heavy menstruation on Eliquis we will send type and screen, check CBC, obtain IV access and determine whether transfusion is necessary.  Patient difficult access, IV placed by me.  Hemoglobin 6.5 down from baseline of 10, given her dizziness and lightheadedness with standing she will require packed red blood cells.  Obtain consent from the patient, will admit to the hospitalist service.  Patient notes bleeding has slowed at this time    ____________________________________________   FINAL CLINICAL IMPRESSION(S) / ED DIAGNOSES  Final diagnoses:  Symptomatic anemia  Menorrhagia with regular cycle        Note:  This document was prepared using Dragon voice recognition software and may include unintentional dictation errors.   Lavonia Drafts, MD 03/03/19 [phone]

## 2019-03-04 DIAGNOSIS — I2699 Other pulmonary embolism without acute cor pulmonale: Secondary | ICD-10-CM | POA: Diagnosis not present

## 2019-03-04 DIAGNOSIS — N920 Excessive and frequent menstruation with regular cycle: Secondary | ICD-10-CM | POA: Diagnosis not present

## 2019-03-04 DIAGNOSIS — D649 Anemia, unspecified: Secondary | ICD-10-CM | POA: Diagnosis not present

## 2019-03-04 LAB — BASIC METABOLIC PANEL
Anion gap: 7 (ref 5–15)
BUN: 16 mg/dL (ref 6–20)
CO2: 28 mmol/L (ref 22–32)
Calcium: 8.5 mg/dL — ABNORMAL LOW (ref 8.9–10.3)
Chloride: 105 mmol/L (ref 98–111)
Creatinine, Ser: 1.04 mg/dL — ABNORMAL HIGH (ref 0.44–1.00)
GFR calc Af Amer: >60 mL/min (ref >60)
GFR calc non Af Amer: >60 mL/min (ref >60)
Glucose, Bld: 102 mg/dL — ABNORMAL HIGH (ref 70–99)
Potassium: 2.9 mmol/L — ABNORMAL LOW (ref 3.5–5.1)
Sodium: 140 mmol/L (ref 135–145)

## 2019-03-04 LAB — CBC
HCT: 20.9 % — ABNORMAL LOW (ref 36.0–46.0)
Hemoglobin: 6.9 g/dL — ABNORMAL LOW (ref 12.0–15.0)
MCH: 28.2 pg (ref 26.0–34.0)
MCHC: 33 g/dL (ref 30.0–36.0)
MCV: 85.3 fL (ref 80.0–100.0)
Platelets: 210 10*3/uL (ref 150–400)
RBC: 2.45 MIL/uL — ABNORMAL LOW (ref 3.87–5.11)
RDW: 14.6 % (ref 11.5–15.5)
WBC: 6.9 10*3/uL (ref 4.0–10.5)
nRBC: 0 % (ref 0.0–0.2)

## 2019-03-04 LAB — HEMOGLOBIN AND HEMATOCRIT, BLOOD
HCT: 24.2 % — ABNORMAL LOW (ref 36.0–46.0)
Hemoglobin: 8 g/dL — ABNORMAL LOW (ref 12.0–15.0)

## 2019-03-04 LAB — PREPARE RBC (CROSSMATCH)

## 2019-03-04 MED ORDER — SODIUM CHLORIDE 0.9% IV SOLUTION
Freq: Once | INTRAVENOUS | Status: AC
  Administered 2019-03-04: 10:00:00 via INTRAVENOUS

## 2019-03-04 MED ORDER — APIXABAN 5 MG PO TABS: 5.0000 mg | ORAL_TABLET | Freq: Two times a day (BID) | ORAL | 0 refills | Status: DC

## 2019-03-04 MED ORDER — POTASSIUM CHLORIDE CRYS ER 20 MEQ PO TBCR
40.0000 meq | EXTENDED_RELEASE_TABLET | ORAL | Status: AC
  Administered 2019-03-04 (×2): 40 meq via ORAL
  Filled 2019-03-04 (×2): qty 2

## 2019-03-04 MED ORDER — POTASSIUM CHLORIDE CRYS ER 20 MEQ PO TBCR: 20.0000 meq | EXTENDED_RELEASE_TABLET | Freq: Every day | ORAL | 0 refills | Status: DC

## 2019-03-04 MED ORDER — FERROUS SULFATE 325 (65 FE) MG PO TBEC: 325.0000 mg | DELAYED_RELEASE_TABLET | Freq: Two times a day (BID) | ORAL | 0 refills | Status: DC

## 2019-03-04 NOTE — Discharge Instructions (Signed)
Resume diet and activity as before ° ° °

## 2019-03-04 NOTE — Progress Notes (Signed)
Patient discharged home per MD order. All discharge instructions given and all questions answered. Patient will make office visits after talking with employer.

## 2019-03-05 LAB — BPAM RBC
Blood Product Expiration Date: 202009102359
Blood Product Expiration Date: 202010112359
Blood Product Expiration Date: 202010112359
ISSUE DATE / TIME: 202009091804
ISSUE DATE / TIME: 202009101049
Unit Type and Rh: 5100
Unit Type and Rh: 5100
Unit Type and Rh: 9500

## 2019-03-05 LAB — TYPE AND SCREEN
ABO/RH(D): O POS
Antibody Screen: NEGATIVE
Unit division: 0
Unit division: 0
Unit division: 0

## 2019-03-05 LAB — HIV ANTIBODY (ROUTINE TESTING W REFLEX): HIV Screen 4th Generation wRfx: NONREACTIVE

## 2019-03-08 ENCOUNTER — Other Ambulatory Visit: Payer: Self-pay

## 2019-03-08 ENCOUNTER — Ambulatory Visit (INDEPENDENT_AMBULATORY_CARE_PROVIDER_SITE_OTHER): Payer: BC Managed Care – PPO | Admitting: Obstetrics and Gynecology

## 2019-03-08 VITALS — BP 122/78 | Wt 181.0 lb

## 2019-03-08 DIAGNOSIS — N921 Excessive and frequent menstruation with irregular cycle: Secondary | ICD-10-CM | POA: Diagnosis not present

## 2019-03-08 DIAGNOSIS — D500 Iron deficiency anemia secondary to blood loss (chronic): Secondary | ICD-10-CM

## 2019-03-08 NOTE — Progress Notes (Signed)
Obstetrics & Gynecology Office Visit   Chief Complaint  Patient presents with  . Consult  Menorrhagia follow up  History of Present Illness: 38 y.o. [phone] female who presents to discuss more definitive treatment for menorrhagia.  She was noted to have a small non-occlusive embolus within the subsegmental right upper lobe branch vessel in early July this year.  She is currently on Eliquis 5 mg po BID.  Her most recent period started about a little over a week ago. She was admitted on 9/9 and discharged on 9/10.  She received 2 units of pRBCs due to a hemoglobin of 6.5.  She continues to have heavy bleeding.  She is currently taking Eliquis 5 mg twice daily for her history of probable PE. She continues to have heavy periods and is passing clots currently.  She had a pelvic ultrasound in February this past year, which showed a bulky uterus and small fibroid.   Her last pap smear was in 06/2018 and it was NILM, HPV negative. She is being followed by Dr. Saunders Revel, cardiologist, for difficult to control hypertension. She had a recent ECHO that showed left ventricular hypertrophy.  Ejection fraction was normal.  She is clearly upset about having to repeatedly be admitted for blood transfusions due to her periods. She has been told to not take any progesterone medication.    Past Medical History:  Diagnosis Date  . Anemia   . Anxiety    panic attacks  . Carpal tunnel syndrome, bilateral   . Chlamydia   . Epilepsy seizure, nonconvulsive, generalized ([place])   . Headache(784.0)    migraines  . History of C-section   . Hypertension   . Infection    UTI  . Migraines   . Pregnancy induced hypertension    with first  . Seizures ([place])    No meds, off since in mid to late 20's  . UTI (urinary tract infection)     Past Surgical History:  Procedure Laterality Date  . CESAREAN SECTION    . MOUTH SURGERY    . TUBAL LIGATION N/A 10/30/2013   Procedure: POST PARTUM TUBAL LIGATION;  Surgeon: [name],  MD;  Location: [facility];  Service: Gynecology;  Laterality: N/A;    Gynecologic History: No LMP recorded. (Menstrual status: Irregular Periods).  Obstetric History: [id number]  Family History  Problem Relation Age of Onset  . Seizures Mother   . Hypertension Mother   . Diabetes Mother   . Heart disease Mother   . Stroke Mother   . Arthritis Mother   . Seizures Brother   . Hearing loss Neg Hx     Social History   Socioeconomic History  . Marital status: Single    Spouse name: Not on file  . Number of children: Not on file  . Years of education: Not on file  . Highest education level: Not on file  Occupational History  . Not on file  Social Needs  . Financial resource strain: Not on file  . Food insecurity    Worry: Not on file    Inability: Not on file  . Transportation needs    Medical: Not on file    Non-medical: Not on file  Tobacco Use  . Smoking status: Former Research scientist (life sciences)  . Smokeless tobacco: Never Used  Substance and Sexual Activity  . Alcohol use: Yes    Comment: socially   . Drug use: No  . Sexual activity: Yes    Birth control/protection: Surgical  Lifestyle  . Physical activity    Days per week: Not on file    Minutes per session: Not on file  . Stress: Not on file  Relationships  . Social Herbalist on phone: Not on file    Gets together: Not on file    Attends religious service: Not on file    Active member of club or organization: Not on file    Attends meetings of clubs or organizations: Not on file    Relationship status: Not on file  . Intimate partner violence    Fear of current or ex partner: Not on file    Emotionally abused: Not on file    Physically abused: Not on file    Forced sexual activity: Not on file  Other Topics Concern  . Not on file  Social History Narrative  . Not on file    Allergies  Allergen Reactions  . Amoxicillin Hives and Other (See Comments)    Has patient had a PCN reaction causing immediate rash,  facial/tongue/throat swelling, SOB or lightheadedness with hypotension: No Has patient had a PCN reaction causing severe rash involving mucus membranes or skin necrosis: No Has patient had a PCN reaction that required hospitalization: No Has patient had a PCN reaction occurring within the last 10 years: Yes If all of the above answers are "NO", then may proceed with Cephalosporin use.     Prior to Admission medications   Medication Sig Start Date End Date Taking? Authorizing Provider  amLODipine (NORVASC) 10 MG tablet Take 1 tablet (10 mg total) by mouth daily. 01/14/19 04/14/19 Yes End, Harrell Gave, MD  apixaban (ELIQUIS) 5 MG TABS tablet Take 1 tablet (5 mg total) by mouth 2 (two) times daily. 03/04/19  Yes Hillary Bow, MD  aspirin-acetaminophen-caffeine (EXCEDRIN MIGRAINE) [phone] MG tablet Take by mouth every 6 (six) hours as needed for headache.   Yes [provider]  chlorthalidone (HYGROTON) 25 MG tablet Take 1 tablet (25 mg total) by mouth daily. 02/25/19  Yes Theora Gianotti, NP  ferrous sulfate 325 (65 FE) MG EC tablet Take 1 tablet (325 mg total) by mouth 2 (two) times daily with a meal. 03/04/19 04/03/19 Yes Sudini, Alveta Heimlich, MD  hydrALAZINE (APRESOLINE) 25 MG tablet Take 1 tablet (25 mg) by mouth twice daily 02/25/19  Yes Theora Gianotti, NP  potassium chloride SA (K-DUR) 20 MEQ tablet Take 1 tablet (20 mEq total) by mouth daily. 03/04/19  Yes Sudini, Alveta Heimlich, MD  Simethicone (GAS-X PO) Take by mouth as needed.   Yes [provider]  labetalol (NORMODYNE) 200 MG tablet Take 2 tablets (400 mg total) by mouth 2 (two) times daily. 10/13/18 03/03/19  Jodelle Green, FNP    Review of Systems  Constitutional: Negative.   HENT: Negative.   Eyes: Negative.   Respiratory: Negative.   Cardiovascular: Negative.   Gastrointestinal: Negative.   Genitourinary: Negative.   Musculoskeletal: Negative.   Skin: Negative.   Neurological: Negative.    Psychiatric/Behavioral: Negative.     Physical Exam BP 122/78   Wt 181 lb (82.1 kg)   BMI 32.58 kg/m  No LMP recorded. (Menstrual status: Irregular Periods). Physical Exam Constitutional:      General: She is not in acute distress.    Appearance: Normal appearance.  HENT:     Head: Normocephalic and atraumatic.  Eyes:     General: No scleral icterus.    Conjunctiva/sclera: Conjunctivae normal.  Neurological:     General:  No focal deficit present.     Mental Status: She is alert and oriented to person, place, and time.     Cranial Nerves: No cranial nerve deficit.  Psychiatric:        Mood and Affect: Mood normal.        Behavior: Behavior normal.        Judgment: Judgment normal.     Female chaperone present for pelvic and breast  portions of the physical exam  Assessment: 38 y.o. [id number] female here for  1. Menorrhagia with irregular cycle   2. Anemia due to chronic blood loss      Plan: Problem List Items Addressed This Visit      Other   Anemia due to chronic blood loss   Heavy menstrual bleeding - Primary     We had a very long discussion regarding potential treatment options.  Her options are extremely limited due to her hypertensive status, requiring multiple medications to control, and her recent probable pulmonary embolism.  No source was found for her embolism and it appears to be a very small 1.  However, she has been told she should not take progesterone treatment.  We discussed a Mirena IUD and its possible benefits.  However, it might be beneficial to have either her cardiologist or a hematologist weigh in on the safety of this type of progesterone, since it is very low-dose.  Alternatively, we discussed hysterectomy.  Given that she is being treated for a pulmonary embolism and her most recent hemoglobin was below 7.0, and she has left ventricular hypertrophy, presumably as a result of chronic very high hypertension, she may not be a great surgical candidate  for hysterectomy.  She becomes quite upset during our visit today as the last few months have been very trying for her.  She would like to avoid surgery if possible.  I will send a message to her cardiologist and we can see if we can have a hematologist weigh in on the safety of Mirena IUD, which does have a low-dose of progesterone, but is only absorbed into the bloodstream in small amounts. She will also need an endometrial biopsy prior to any treatment, given the heavy nature of her menses.   25 minutes spent in face to face discussion with > 50% spent in counseling,management, and coordination of care of her menorrhagia with irregular cycle and anemia due to chronic blood loss.   Prentice Docker, MD 03/09/2019 8:35 AM

## 2019-03-09 ENCOUNTER — Telehealth: Payer: Self-pay | Admitting: Obstetrics and Gynecology

## 2019-03-09 ENCOUNTER — Encounter: Payer: Self-pay | Admitting: Obstetrics and Gynecology

## 2019-03-09 NOTE — Telephone Encounter (Signed)
Patient is schedule in [place] on Wednesday, 03/10/19 with SDJ for Mirena insertion

## 2019-03-17 ENCOUNTER — Encounter: Payer: Self-pay | Admitting: Obstetrics and Gynecology

## 2019-03-17 ENCOUNTER — Other Ambulatory Visit (HOSPITAL_COMMUNITY)
Admission: RE | Admit: 2019-03-17 | Discharge: 2019-03-17 | Disposition: A | Discharge status: Home / Self Care | Payer: BC Managed Care – PPO | Source: Ambulatory Visit | Attending: Obstetrics and Gynecology | Admitting: Obstetrics and Gynecology

## 2019-03-17 ENCOUNTER — Other Ambulatory Visit: Payer: Self-pay

## 2019-03-17 ENCOUNTER — Ambulatory Visit (INDEPENDENT_AMBULATORY_CARE_PROVIDER_SITE_OTHER): Payer: BC Managed Care – PPO | Admitting: Obstetrics and Gynecology

## 2019-03-17 VITALS — BP 128/76 | HR 70 | Ht 63.0 in | Wt 185.0 lb

## 2019-03-17 DIAGNOSIS — D500 Iron deficiency anemia secondary to blood loss (chronic): Secondary | ICD-10-CM | POA: Diagnosis not present

## 2019-03-17 DIAGNOSIS — Z30430 Encounter for insertion of intrauterine contraceptive device: Secondary | ICD-10-CM | POA: Diagnosis not present

## 2019-03-17 DIAGNOSIS — N920 Excessive and frequent menstruation with regular cycle: Secondary | ICD-10-CM | POA: Insufficient documentation

## 2019-03-17 DIAGNOSIS — Z113 Encounter for screening for infections with a predominantly sexual mode of transmission: Secondary | ICD-10-CM

## 2019-03-17 MED ORDER — LEVONORGESTREL 20 MCG/24HR IU IUD: 1.0000 | INTRAUTERINE_SYSTEM | Freq: Once | INTRAUTERINE | 0 refills | Status: DC

## 2019-03-17 NOTE — Progress Notes (Signed)
   Endometrial Biopsy After discussion with the patient regarding her abnormal uterine bleeding I recommended that she proceed with an endometrial biopsy for further diagnosis. The risks, benefits, alternatives, and indications for an endometrial biopsy were discussed with the patient in detail. She understood the risks including infection, bleeding, cervical laceration and uterine perforation.  Verbal consent was obtained.   PROCEDURE NOTE:  Pipelle endometrial biopsy was performed using aseptic technique with iodine preparation.  The uterus was sounded to a length of 8 cm.  Adequate sampling was obtained with minimal blood loss.  The patient tolerated the procedure well.  Disposition will be pending pathology.   IUD Insertion Procedure Note Patient identified, informed consent performed, consent signed.   Discussed risks of irregular bleeding, cramping, infection, malpositioning, expulsion or uterine perforation of the IUD (1:1000 placements)  which may require further procedure such as laparoscopy.  IUD while effective at preventing pregnancy do not prevent transmission of sexually transmitted diseases and use of barrier methods for this purpose was discussed. Time out was performed.  Urine pregnancy test negative.  Speculum already placed in the vagina.  Cervix previously visualized and cleaned with Betadine x 2.  Grasped anteriorly with a single tooth tenaculum.  Uterus sounded to 8 cm with biopsy above. IUD placed per manufacturer's recommendations.  Strings trimmed to 3 cm. Tenaculum was removed, good hemostasis noted with use of silver nitrate.  Patient tolerated procedure well.   Patient was given post-procedure instructions.  She was advised to have backup contraception for one week.  Patient was also asked to check IUD strings periodically and follow up in 4 weeks for IUD check.    Prentice Docker, MD, [facility], [facility] 03/17/2019 9:55 AM

## 2019-03-19 DIAGNOSIS — N920 Excessive and frequent menstruation with regular cycle: Secondary | ICD-10-CM | POA: Diagnosis not present

## 2019-03-19 DIAGNOSIS — D500 Iron deficiency anemia secondary to blood loss (chronic): Secondary | ICD-10-CM | POA: Diagnosis not present

## 2019-03-19 LAB — CERVICOVAGINAL ANCILLARY ONLY
Chlamydia: NEGATIVE
Neisseria Gonorrhea: NEGATIVE

## 2019-03-22 LAB — SURGICAL PATHOLOGY

## 2019-03-22 NOTE — Progress Notes (Signed)
Would you mind calling the patient and letting her know that her biopsy results were totally normal? Thanks!

## 2019-03-23 NOTE — Discharge Summary (Signed)
[facility] NAME: Mary Roberts    MR#:  [id number]  DATE OF BIRTH:  05/02/1981  DATE OF ADMISSION:  03/03/2019 ADMITTING PHYSICIAN: Hillary Bow, MD  DATE OF DISCHARGE: 03/04/2019  4:15 PM  PRIMARY CARE PHYSICIAN: Jodelle Green, FNP   ADMISSION DIAGNOSIS:  Menorrhagia with regular cycle [N92.0] Symptomatic anemia [D64.9]  DISCHARGE DIAGNOSIS:  Active Problems:   Anemia   SECONDARY DIAGNOSIS:   Past Medical History:  Diagnosis Date  . Anemia   . Anxiety    panic attacks  . Carpal tunnel syndrome, bilateral   . Chlamydia   . Epilepsy seizure, nonconvulsive, generalized ([place])   . Headache(784.0)    migraines  . History of C-section   . Hypertension   . Infection    UTI  . Migraines   . Pregnancy induced hypertension    with first  . Seizures ([place])    No meds, off since in mid to late 20's  . UTI (urinary tract infection)      ADMITTING HISTORY  HISTORY OF PRESENT ILLNESS:  Mary Roberts  is a 38 y.o. female with a known history of menorrhagia, hypertension, PE in July 2020 on Eliquis presents to the hospital due to extreme fatigue, dizziness on standing up.  Her hemoglobin has been found to be 6.  She has been working with gynecology due to menorrhagia and was started on hormone pills.  After this she was found to have a small PE and started on Eliquis in July.  Since then her menorrhagia has been worse.  She continues to follow with gynecology and is supposed to be on iron pills that she takes infrequently.  Patient is being admitted to the hospital under observation for symptomatic anemia. No blood in stool or black stools  HOSPITAL COURSE:   *Acute on chronic anemia secondary to menorrhagia.  Transfused 2 unit of packed RBC due to symptomatic anemia and hemoglobin 6.5. Hemoglobin improved to 8 by time of discharge Symptoms have improved. Has follow-up with gynecology as outpatient for  menorrhagia.  *Menorrhagia.  Will need outpatient gynecology follow-up.  *Pulmonary embolism in July 2020.  Continue Eliquis.  Follows with cardiology Dr. Saunders Revel.  *Hypokalemia.    Replaced orally and prescription given at discharge  *Hypertension.  Continue home medications.  Patient discharged home in stable condition with gynecology follow-up.  CONSULTS OBTAINED:    DRUG ALLERGIES:   Allergies  Allergen Reactions  . Amoxicillin Hives and Other (See Comments)    Has patient had a PCN reaction causing immediate rash, facial/tongue/throat swelling, SOB or lightheadedness with hypotension: No Has patient had a PCN reaction causing severe rash involving mucus membranes or skin necrosis: No Has patient had a PCN reaction that required hospitalization: No Has patient had a PCN reaction occurring within the last 10 years: Yes If all of the above answers are "NO", then may proceed with Cephalosporin use.     DISCHARGE MEDICATIONS:   Allergies as of 03/04/2019      Reactions   Amoxicillin Hives, Other (See Comments)   Has patient had a PCN reaction causing immediate rash, facial/tongue/throat swelling, SOB or lightheadedness with hypotension: No Has patient had a PCN reaction causing severe rash involving mucus membranes or skin necrosis: No Has patient had a PCN reaction that required hospitalization: No Has patient had a PCN reaction occurring within the last 10 years: Yes If all of the above answers are "NO", then may proceed with  Cephalosporin use.      Medication List    TAKE these medications   amLODipine 10 MG tablet Commonly known as: NORVASC Take 1 tablet (10 mg total) by mouth daily.   apixaban 5 MG Tabs tablet Commonly known as: ELIQUIS Take 1 tablet (5 mg total) by mouth 2 (two) times daily.   aspirin-acetaminophen-caffeine [id number] MG tablet Commonly known as: EXCEDRIN MIGRAINE Take by mouth every 6 (six) hours as needed for headache.   chlorthalidone 25  MG tablet Commonly known as: HYGROTON Take 1 tablet (25 mg total) by mouth daily.   ferrous sulfate 325 (65 FE) MG EC tablet Take 1 tablet (325 mg total) by mouth 2 (two) times daily with a meal.   GAS-X PO Take by mouth as needed.   hydrALAZINE 25 MG tablet Commonly known as: APRESOLINE Take 1 tablet (25 mg) by mouth twice daily   labetalol 200 MG tablet Commonly known as: NORMODYNE Take 2 tablets (400 mg total) by mouth 2 (two) times daily.   potassium chloride SA 20 MEQ tablet Commonly known as: KLOR-CON Take 1 tablet (20 mEq total) by mouth daily.       Today   VITAL SIGNS:  Blood pressure 108/64, pulse 76, temperature 98.7 F (37.1 C), temperature source Oral, resp. rate 18, height 5' 2.5" (1.588 m), weight 82.3 kg, SpO2 100 %.  I/O:  No intake or output data in the 24 hours ending 03/23/19 2330  PHYSICAL EXAMINATION:  Physical Exam  GENERAL:  38 y.o.-year-old patient lying in the bed with no acute distress.  LUNGS: Normal breath sounds bilaterally, no wheezing, rales,rhonchi or crepitation. No use of accessory muscles of respiration.  CARDIOVASCULAR: S1, S2 normal. No murmurs, rubs, or gallops.  ABDOMEN: Soft, non-tender, non-distended. Bowel sounds present. No organomegaly or mass.  NEUROLOGIC: Moves all 4 extremities. PSYCHIATRIC: The patient is alert and oriented x 3.  SKIN: No obvious rash, lesion, or ulcer.   DATA REVIEW:   CBC No results for input(s): WBC, HGB, HCT, PLT in the last 168 hours.  Chemistries  No results for input(s): NA, K, CL, CO2, GLUCOSE, BUN, CREATININE, CALCIUM, MG, AST, ALT, ALKPHOS, BILITOT in the last 168 hours.  Invalid input(s): GFRCGP  Cardiac Enzymes No results for input(s): TROPONINI in the last 168 hours.  Microbiology Results  Results for orders placed or performed in visit on 02/01/19  Novel Coronavirus, NAA ([facility])     Status: None   Collection Time: 02/01/19  7:50 AM   Specimen: Oropharyngeal(OP) collection in  vial transport medium   OROPHARYNGEA  TESTING  Result Value Ref Range Status   SARS-CoV-2, NAA Not Detected Not Detected Final    Comment: Testing was performed using the cobas(R) SARS-CoV-2 test. This test was developed and its performance characteristics determined by [employer]. This test has not been FDA cleared or approved. This test has been authorized by FDA under an Emergency Use Authorization (EUA). This test is only authorized for the duration of time the declaration that circumstances exist justifying the authorization of the emergency use of in vitro diagnostic tests for detection of SARS-CoV-2 virus and/or diagnosis of COVID-19 infection under section 564(b)(1) of the Act, 21 U.S.C. [id number]), unless the authorization is terminated or revoked sooner. When diagnostic testing is negative, the possibility of a false negative result should be considered in the context of a patient's recent exposures and the presence of clinical signs and symptoms consistent with COVID-19. An individual without symptoms of COVID-19 and who is  not shedding SARS-CoV-2 virus would expect to have a negati ve (not detected) result in this assay.     RADIOLOGY:  No results found.  Follow up with PCP in 1 week.  Management plans discussed with the patient, family and they are in agreement.  CODE STATUS:  Code Status History    Date Active Date Inactive Code Status Order ID Comments User Context   03/03/2019 1641 03/04/2019 1933 Full Code [id number]  Hillary Bow, MD ED   01/07/2019 0514 01/08/2019 1807 Full Code [id number]  Mayer Camel, NP ED   02/02/2018 0006 02/02/2018 1637 Full Code [id number]  Lance Coon, MD Inpatient   07/14/2016 1244 07/15/2016 1913 Full Code [id number]  Samella Parr, NP Inpatient   10/29/2013 2318 11/01/2013 1601 Full Code [id number]  Clarisa Schools, MD Inpatient   10/28/2013 1012 10/29/2013 2318 Full Code [id number]  [name], DO Inpatient    Advance Care Planning Activity      TOTAL TIME TAKING CARE OF THIS PATIENT ON DAY OF DISCHARGE: more than 30 minutes.   Leia Alf Reanne Nellums M.D on 03/23/2019 at 11:30 PM  Between 7am to 6pm - Pager - [phone]  After 6pm go to [email or web address] - password [email or web address] [facility]  Office  [phone]  CC: Primary care physician; Jodelle Green, FNP  Note: This dictation was prepared with Dragon dictation along with smaller phrase technology. Any transcriptional errors that result from this process are unintentional.

## 2019-03-31 ENCOUNTER — Telehealth: Payer: Self-pay

## 2019-03-31 ENCOUNTER — Telehealth: Payer: Self-pay | Admitting: Obstetrics and Gynecology

## 2019-03-31 NOTE — Telephone Encounter (Signed)
Patient IUD fell out, r/s to have new one placed 10/12 with [facility] at [facility]. Mirena

## 2019-03-31 NOTE — Telephone Encounter (Signed)
-----   Message from Will Bonnet, MD sent at 03/22/2019  1:31 PM EDT ----- Would you mind calling the patient and letting her know that her biopsy results were totally normal? Thanks!

## 2019-03-31 NOTE — Telephone Encounter (Signed)
Pt was aware via vm.

## 2019-03-31 NOTE — Telephone Encounter (Signed)
I would recommend a new one. I think that's the best option for the patient. The IUD can slip out a low percentage of the time.  Just because it happens once, doesn't mean it couldn't happen again

## 2019-04-05 ENCOUNTER — Encounter: Payer: Self-pay | Admitting: Obstetrics and Gynecology

## 2019-04-05 ENCOUNTER — Ambulatory Visit (INDEPENDENT_AMBULATORY_CARE_PROVIDER_SITE_OTHER): Payer: BC Managed Care – PPO | Admitting: Obstetrics and Gynecology

## 2019-04-05 ENCOUNTER — Other Ambulatory Visit: Payer: Self-pay

## 2019-04-05 VITALS — BP 122/74 | Ht 62.0 in | Wt 181.0 lb

## 2019-04-05 DIAGNOSIS — N920 Excessive and frequent menstruation with regular cycle: Secondary | ICD-10-CM

## 2019-04-05 DIAGNOSIS — Z30430 Encounter for insertion of intrauterine contraceptive device: Secondary | ICD-10-CM

## 2019-04-05 DIAGNOSIS — D500 Iron deficiency anemia secondary to blood loss (chronic): Secondary | ICD-10-CM

## 2019-04-05 MED ORDER — FERRALET 90 90-1 MG PO TABS: 1.0000 | ORAL_TABLET | Freq: Every day | ORAL | 5 refills | Status: DC

## 2019-04-05 MED ORDER — LEVONORGESTREL 20 MCG/24HR IU IUD: 1.0000 | INTRAUTERINE_SYSTEM | Freq: Once | INTRAUTERINE | 0 refills | Status: DC

## 2019-04-05 NOTE — Addendum Note (Signed)
Addended by: Prentice Docker D on: 04/05/2019 04:39 PM   Modules accepted: Orders

## 2019-04-05 NOTE — Telephone Encounter (Signed)
Noted. Mirena reserved for this patient. 

## 2019-04-05 NOTE — Progress Notes (Signed)
   IUD Insertion Procedure Note Patient identified, informed consent performed, consent signed.   Discussed risks of irregular bleeding, cramping, infection, malpositioning, expulsion or uterine perforation of the IUD (1:1000 placements)  which may require further procedure such as laparoscopy.  IUD while effective at preventing pregnancy do not prevent transmission of sexually transmitted diseases and use of barrier methods for this purpose was discussed. Time out was performed.  Urine pregnancy test negative.  Speculum placed in the vagina.  Cervix visualized.  Cleaned with Betadine x 2.  Grasped anteriorly with a single tooth tenaculum.  Uterus sounded to 8 cm. IUD placed per manufacturer's recommendations.  Strings trimmed to 3 cm. Tenaculum was removed, good hemostasis noted.  Patient tolerated procedure well.   Patient was given post-procedure instructions.  She was advised to have backup contraception for one week.  Patient was also asked to check IUD strings periodically and follow up in 4 weeks for IUD check.   Prentice Docker, MD, [facility], [facility] 04/05/2019 4:36 PM

## 2019-04-07 ENCOUNTER — Other Ambulatory Visit: Payer: Self-pay

## 2019-04-07 MED ORDER — APIXABAN 5 MG PO TABS: 5.0000 mg | ORAL_TABLET | Freq: Two times a day (BID) | ORAL | 6 refills | Status: DC

## 2019-04-07 NOTE — Telephone Encounter (Signed)
Pt's age 38, wt 82.1 kg, SCr 1.04, CrCl 96.92, last ov w/ CE 01/14/19.

## 2019-04-07 NOTE — Telephone Encounter (Signed)
*  STAT* If patient is at the pharmacy, call can be transferred to refill team.   1. Which medications need to be refilled? (please list name of each medication and dose if known) Eliquis  2. Which pharmacy/location (including street and city if local pharmacy) is medication to be sent to? [place]  3. Do they need a 30 day or 90 day supply? 90 Patient has been out since Saturday

## 2019-04-07 NOTE — Telephone Encounter (Signed)
Refill request

## 2019-04-11 ENCOUNTER — Other Ambulatory Visit: Payer: Self-pay | Admitting: Family Medicine

## 2019-04-11 DIAGNOSIS — I10 Essential (primary) hypertension: Secondary | ICD-10-CM

## 2019-04-14 ENCOUNTER — Ambulatory Visit: Payer: BC Managed Care – PPO | Admitting: Obstetrics and Gynecology

## 2019-04-21 ENCOUNTER — Ambulatory Visit: Payer: BC Managed Care – PPO | Admitting: Internal Medicine

## 2019-05-03 ENCOUNTER — Other Ambulatory Visit: Payer: Self-pay

## 2019-05-03 ENCOUNTER — Ambulatory Visit (INDEPENDENT_AMBULATORY_CARE_PROVIDER_SITE_OTHER): Payer: BC Managed Care – PPO | Admitting: Obstetrics and Gynecology

## 2019-05-03 VITALS — BP 122/74 | Ht 62.0 in

## 2019-05-03 DIAGNOSIS — D500 Iron deficiency anemia secondary to blood loss (chronic): Secondary | ICD-10-CM | POA: Diagnosis not present

## 2019-05-03 DIAGNOSIS — N920 Excessive and frequent menstruation with regular cycle: Secondary | ICD-10-CM

## 2019-05-03 NOTE — Progress Notes (Signed)
Obstetrics & Gynecology Office Visit    Chief Complaint  Patient presents with  . Follow-up  IUD check. She states that the IUD has already fallen out.   History of Present Illness: 38 y.o. [phone] female who presents in follow-up for an IUD check.  She had her IUD placed on September 23.  That IUD fell out a couple weeks later.  She had another IUD placed on October 12.  She states that on November 1 that she was able to see the string sticking out of her vagina and the IUD came out.  She would like to discuss further treatment options.   Past Medical History:  Diagnosis Date  . Anemia   . Anxiety    panic attacks  . Carpal tunnel syndrome, bilateral   . Chlamydia   . Epilepsy seizure, nonconvulsive, generalized ([place])   . Headache(784.0)    migraines  . History of C-section   . Hypertension   . Infection    UTI  . Migraines   . Pregnancy induced hypertension    with first  . Seizures ([place])    No meds, off since in mid to late 20's  . UTI (urinary tract infection)     Past Surgical History:  Procedure Laterality Date  . CESAREAN SECTION    . MOUTH SURGERY    . TUBAL LIGATION N/A 10/30/2013   Procedure: POST PARTUM TUBAL LIGATION;  Surgeon: [name], MD;  Location: [facility];  Service: Gynecology;  Laterality: N/A;    Gynecologic History: No LMP recorded. (Menstrual status: Irregular Periods).  Obstetric History: [id number]  Family History  Problem Relation Age of Onset  . Seizures Mother   . Hypertension Mother   . Diabetes Mother   . Heart disease Mother   . Stroke Mother   . Arthritis Mother   . Seizures Brother   . Hearing loss Neg Hx     Social History   Socioeconomic History  . Marital status: Single    Spouse name: Not on file  . Number of children: Not on file  . Years of education: Not on file  . Highest education level: Not on file  Occupational History  . Not on file  Social Needs  . Financial resource strain: Not on file  . Food  insecurity    Worry: Not on file    Inability: Not on file  . Transportation needs    Medical: Not on file    Non-medical: Not on file  Tobacco Use  . Smoking status: Former Research scientist (life sciences)  . Smokeless tobacco: Never Used  Substance and Sexual Activity  . Alcohol use: Yes    Comment: socially   . Drug use: No  . Sexual activity: Yes    Birth control/protection: Surgical, I.U.D.  Lifestyle  . Physical activity    Days per week: Not on file    Minutes per session: Not on file  . Stress: Not on file  Relationships  . Social Herbalist on phone: Not on file    Gets together: Not on file    Attends religious service: Not on file    Active member of club or organization: Not on file    Attends meetings of clubs or organizations: Not on file    Relationship status: Not on file  . Intimate partner violence    Fear of current or ex partner: Not on file    Emotionally abused: Not on file    Physically  abused: Not on file    Forced sexual activity: Not on file  Other Topics Concern  . Not on file  Social History Narrative  . Not on file    Allergies  Allergen Reactions  . Amoxicillin Hives and Other (See Comments)    Has patient had a PCN reaction causing immediate rash, facial/tongue/throat swelling, SOB or lightheadedness with hypotension: No Has patient had a PCN reaction causing severe rash involving mucus membranes or skin necrosis: No Has patient had a PCN reaction that required hospitalization: No Has patient had a PCN reaction occurring within the last 10 years: Yes If all of the above answers are "NO", then may proceed with Cephalosporin use.     Prior to Admission medications   Medication Sig Start Date End Date Taking? Authorizing Provider  amLODipine (NORVASC) 10 MG tablet Take 1 tablet (10 mg total) by mouth daily. 01/14/19 04/14/19  End, Harrell Gave, MD  apixaban (ELIQUIS) 5 MG TABS tablet Take 1 tablet (5 mg total) by mouth 2 (two) times daily. 04/07/19    End, Harrell Gave, MD  aspirin-acetaminophen-caffeine (EXCEDRIN MIGRAINE) [phone] MG tablet Take by mouth every 6 (six) hours as needed for headache.    [provider]  chlorthalidone (HYGROTON) 25 MG tablet Take 1 tablet (25 mg total) by mouth daily. 02/25/19   Theora Gianotti, NP  Fe Cbn-Fe Gluc-FA-B12-C-DSS (FERRALET 90) 90-1 MG TABS Take 1 tablet by mouth daily. 04/05/19   Will Bonnet, MD  ferrous sulfate 325 (65 FE) MG EC tablet Take 1 tablet (325 mg total) by mouth 2 (two) times daily with a meal. 03/04/19 04/03/19  Hillary Bow, MD  hydrALAZINE (APRESOLINE) 25 MG tablet Take 1 tablet (25 mg) by mouth twice daily 02/25/19   Theora Gianotti, NP  labetalol (NORMODYNE) 200 MG tablet TAKE 2 TABLETS(400 MG) BY MOUTH TWICE DAILY 04/12/19   Guse, Jacquelynn Cree, FNP  levonorgestrel (MIRENA) 20 MCG/24HR IUD 1 Intra Uterine Device (1 each total) by Intrauterine route once for 1 dose. 04/05/19 04/05/19  Will Bonnet, MD  potassium chloride SA (K-DUR) 20 MEQ tablet Take 1 tablet (20 mEq total) by mouth daily. 03/04/19   Hillary Bow, MD  Simethicone (GAS-X PO) Take by mouth as needed.    [provider]    Review of Systems  Constitutional: Negative.   HENT: Negative.   Eyes: Negative.   Respiratory: Negative.   Cardiovascular: Negative.   Gastrointestinal: Negative.   Genitourinary: Negative.   Musculoskeletal: Negative.   Skin: Negative.   Neurological: Negative.   Psychiatric/Behavioral: Negative.      Physical Exam BP 122/74   Ht 5\' 2"  (1.575 m)   BMI 33.11 kg/m  No LMP recorded. (Menstrual status: Irregular Periods). Physical Exam Constitutional:      General: She is not in acute distress.    Appearance: Normal appearance.  HENT:     Head: Normocephalic and atraumatic.  Eyes:     General: No scleral icterus.    Conjunctiva/sclera: Conjunctivae normal.  Neurological:     General: No focal deficit present.     Mental Status: She is  alert and oriented to person, place, and time.     Cranial Nerves: No cranial nerve deficit.  Psychiatric:        Mood and Affect: Mood normal.        Behavior: Behavior normal.        Judgment: Judgment normal.     Female chaperone present for pelvic and breast  portions of the physical exam  Assessment: 38 y.o. [phone] female here for  1. Menorrhagia with regular cycle   2. Anemia due to chronic blood loss      Plan: Problem List Items Addressed This Visit      Other   Anemia due to chronic blood loss   Menorrhagia with regular cycle - Primary     This is a complicated medical patient.  She has severe hypertension requiring multiple medications to treat.  Further, she has a history of DVT with PE.  She has attempted two IUDs, both of which have come out fairly early after placement.  We discussed other treatment options.  However, we are limited by her history of hypertension and VTE.  We discussed minor surgery which would include an endometrial ablation.  We discussed the likelihood of success of this surgical route.  Further, we discussed hysterectomy, which would be a permanent solution.  This route would be riskier due to her hypertension and history of VTE.  However, she has been hospitalized in order to receive blood transfusions due to anemia as a result of her heavy menstrual bleeding.  We discussed the potential risks of major surgery versus a minor surgery and she would prefer to have a definitive solution to her issue.  Therefore, she elects a total laparoscopic hysterectomy with bilateral salpingectomy.  She understands that this remains removal of her uterus, cervix, and fallopian tubes.  I would leave her ovaries in place, given her age.  She voiced understanding and agreement to proceed.  She had a normal Pap smear in January of this year with normal HPV testing.  She had an endometrial biopsy last month, which was normal.  Prentice Docker, MD 05/04/2019 2:27 PM        ICD-10-CM   1. Menorrhagia with regular cycle  N92.0   2. Anemia due to chronic blood loss  D50.0

## 2019-05-04 ENCOUNTER — Encounter: Payer: Self-pay | Admitting: Obstetrics and Gynecology

## 2019-05-05 ENCOUNTER — Telehealth: Payer: Self-pay | Admitting: Obstetrics and Gynecology

## 2019-05-05 NOTE — Telephone Encounter (Signed)
Patient is aware of H&P at [facility] on 06/21/19 @ 4:10pm w/ Dr. Glennon Mac, Pre-admit testing to be scheduled (patient will watch for MyChart notification), COVID testing on 12/31, and OR on 06/29/19. Patient is aware to quarantine after COVID testing. Patient is aware she may receive calls from the [facility]. Patient confirmed BCBS and secondary Medicaid FPW. Patient is aware FPW will not pay. Patient is scheduled 11/13 @ 3:35pm w/ Dr. Sharolyn Douglas and is aware she needs cardiology clearance, per Dr. Glennon Mac.

## 2019-05-05 NOTE — Telephone Encounter (Signed)
-----   Message from Will Bonnet, MD sent at 05/04/2019  2:32 PM EST ----- Regarding: Schedule surgery Surgery Booking Request Patient Full Name:  Mary Roberts  MRN: [id number]  DOB: [birth year]  Surgeon: Prentice Docker, MD  Requested Surgery Date and Time: TBD Primary Diagnosis AND Code:  1) Menorrhagia with regular cycle (N92.0) 2) Anemia due to chronic blood loss (D50.0)  Secondary Diagnosis and Code:  Surgical Procedure: TLH/BS/Cysto L&D Notification: No Admission Status: observation Length of Surgery: 2 hours Special Case Needs: No H&P: yes. TBD Phone Interview???:  No Interpreter: No Language:  Medical Clearance:  Yes (please have cardiology clear her for surgery) Special Scheduling Instructions: see above Any known health/anesthesia issues, diabetes, sleep apnea, latex allergy, defibrillator/pacemaker?: history of VTE, difficult to control hypertension Acuity: P2 (this is due to her need for blood transfusion with her menses)   (P1 highest, P2 delay may cause harm, P3 low, elective gyn, P4 lowest)

## 2019-05-07 ENCOUNTER — Other Ambulatory Visit: Payer: Self-pay

## 2019-05-07 ENCOUNTER — Ambulatory Visit (INDEPENDENT_AMBULATORY_CARE_PROVIDER_SITE_OTHER): Payer: BC Managed Care – PPO | Admitting: Nurse Practitioner

## 2019-05-07 ENCOUNTER — Encounter: Payer: Self-pay | Admitting: Nurse Practitioner

## 2019-05-07 VITALS — BP 116/70 | HR 63 | Temp 97.3°F | Ht 62.5 in | Wt 182.5 lb

## 2019-05-07 DIAGNOSIS — Z01810 Encounter for preprocedural cardiovascular examination: Secondary | ICD-10-CM | POA: Diagnosis not present

## 2019-05-07 DIAGNOSIS — I2693 Single subsegmental pulmonary embolism without acute cor pulmonale: Secondary | ICD-10-CM

## 2019-05-07 DIAGNOSIS — I2699 Other pulmonary embolism without acute cor pulmonale: Secondary | ICD-10-CM | POA: Insufficient documentation

## 2019-05-07 DIAGNOSIS — I10 Essential (primary) hypertension: Secondary | ICD-10-CM | POA: Diagnosis not present

## 2019-05-07 NOTE — Progress Notes (Signed)
Office Visit    Patient Name: Mary Roberts Date of Encounter: 05/07/2019  Primary Care Provider:  Jodelle Green, FNP Primary Cardiologist:  Nelva Bush, MD  Chief Complaint    38 year old female with a history of uncontrolled hypertension, pulmonary embolus, seizure disorder, anemia, anxiety attacks, and preeclampsia, who presents for follow-up of hypertension and preoperative evaluation.  Past Medical History    Past Medical History:  Diagnosis Date   Anemia    Anxiety    panic attacks   Carpal tunnel syndrome, bilateral    Chlamydia    Epilepsy seizure, nonconvulsive, generalized ([place])    Headache(784.0)    migraines   History of C-section    Hypertension    Hypertensive heart disease    Infection    UTI   LVH (left ventricular hypertrophy)    a. 12/2018 Echo: EF 60-65%, sev inc LV wall thickness. Diast dysfxn. Triv AI.   Menorrhagia    Migraines    Pregnancy induced hypertension    with first   Pulmonary embolism ([place])    a. 12/2018 CTA Chest (pleuritic c/p): probable small, nonocclusive PE w/in subsegmental RUL branch vessel. Artifact noted as well.  Eliquis initiatied.   Seizures ([facility])    No meds, off since in mid to late 20's   UTI (urinary tract infection)    Past Surgical History:  Procedure Laterality Date   CESAREAN SECTION     MOUTH SURGERY     TUBAL LIGATION N/A 10/30/2013   Procedure: POST PARTUM TUBAL LIGATION;  Surgeon: [name], MD;  Location: [facility];  Service: Gynecology;  Laterality: N/A;    Allergies  Allergies  Allergen Reactions   Amoxicillin Hives and Other (See Comments)    Has patient had a PCN reaction causing immediate rash, facial/tongue/throat swelling, SOB or lightheadedness with hypotension: No Has patient had a PCN reaction causing severe rash involving mucus membranes or skin necrosis: No Has patient had a PCN reaction that required hospitalization: No Has patient had a PCN reaction occurring  within the last 10 years: Yes If all of the above answers are "NO", then may proceed with Cephalosporin use.     History of Present Illness    38 year old female with above past medical history including hypertension, seizure disorder, anemia, anxiety attacks, and preeclampsia.  In July of this year, she was seen in the emergency department with uncontrolled hypertension, chest pain, and dyspnea.  CT angiography of the chest showed probable nonocclusive PE within the segmental right upper lobe branch vessel.  Lower extremity ultrasounds were negative for DVT.  She was admitted for blood pressure management and anticoagulation.  Echo showed normal LV function with diastolic dysfunction and severe LVH.  She followed up in late July with Dr. Saunders Revel and medications were consolidated.  At follow-up on September 3, her blood pressure was stable.  We reduce her hydralazine to 25 mg twice daily that day in an effort to start weaning it off.  Unfortunately, in the setting of oral anticoagulation, she was admitted to the hospital on September 9 with abnormal uterine bleeding and severe anemia with a hemoglobin/hematocrit of 6.5/20.5.  She received 2 units of packed red blood cells and has since followed up with OB-GYN for IUD x2 (both have fallen out), and plan for total laparoscopic hysterectomy with bilateral salpingectomy in early January.  She presents today for preoperative evaluation and to discuss bridging with Lovenox.  She has not had any chest pain or dyspnea  and her blood pressure has been well controlled at home on her current regimen.  She denies palpitations, PND, orthopnea, dizziness, syncope, edema, or early satiety.  She notes good activity tolerance without limitations.  She is concerned about her bleeding risk perioperatively and also questions her duration of therapy for unprovoked PE.  She has not seen primary care since August.  Home Medications    Prior to Admission medications   Medication  Sig Start Date End Date Taking? Authorizing Provider  amLODipine (NORVASC) 10 MG tablet Take 1 tablet (10 mg total) by mouth daily. 01/14/19 05/07/19 Yes End, Harrell Gave, MD  apixaban (ELIQUIS) 5 MG TABS tablet Take 1 tablet (5 mg total) by mouth 2 (two) times daily. 04/07/19  Yes End, Harrell Gave, MD  aspirin-acetaminophen-caffeine (EXCEDRIN MIGRAINE) [phone] MG tablet Take by mouth every 6 (six) hours as needed for headache.   Yes [provider]  chlorthalidone (HYGROTON) 25 MG tablet Take 1 tablet (25 mg total) by mouth daily. 02/25/19  Yes Theora Gianotti, NP  ferrous sulfate 325 (65 FE) MG EC tablet Take 1 tablet (325 mg total) by mouth 2 (two) times daily with a meal. 03/04/19 05/07/19 Yes Sudini, Alveta Heimlich, MD  hydrALAZINE (APRESOLINE) 25 MG tablet Take 1 tablet (25 mg) by mouth twice daily 02/25/19  Yes Theora Gianotti, NP  labetalol (NORMODYNE) 200 MG tablet TAKE 2 TABLETS(400 MG) BY MOUTH TWICE DAILY 04/12/19  Yes Guse, Jacquelynn Cree, FNP  potassium chloride SA (K-DUR) 20 MEQ tablet Take 1 tablet (20 mEq total) by mouth daily. 03/04/19  Yes Sudini, Alveta Heimlich, MD  Simethicone (GAS-X PO) Take by mouth as needed.   Yes [provider]    Review of Systems    She denies chest pain, palpitations, dyspnea, pnd, orthopnea, n, v, dizziness, syncope, edema, weight gain, or early satiety.  She is concerned that she may have very heavy menses in December as she no longer has an IUD in place.  All other systems reviewed and are otherwise negative except as noted above.  Physical Exam    VS:  BP 116/70 (BP Location: Left Arm, Patient Position: Sitting, Cuff Size: Normal)    Pulse 63    Temp (!) 97.3 F (36.3 C)    Ht 5' 2.5" (1.588 m)    Wt 182 lb 8 oz (82.8 kg)    BMI 32.85 kg/m  , BMI Body mass index is 32.85 kg/m. GEN: Well nourished, well developed, in no acute distress. HEENT: normal. Neck: Supple, no JVD, carotid bruits, or masses. Cardiac: RRR, no murmurs, rubs,  or gallops. No clubbing, cyanosis, edema.  Radials/PT 2+ and equal bilaterally.  Respiratory:  Respirations regular and unlabored, clear to auscultation bilaterally. GI: Soft, nontender, nondistended, BS + x 4. MS: no deformity or atrophy. Skin: warm and dry, no rash. Neuro:  Strength and sensation are intact. Psych: Normal affect.  Accessory Clinical Findings    ECG personally reviewed by me today -regular sinus rhythm, 63, delayed R wave progression- no acute changes.  Lab Results  Component Value Date   WBC 6.9 03/04/2019   HGB 8.0 (L) 03/04/2019   HCT 24.2 (L) 03/04/2019   MCV 85.3 03/04/2019   PLT 210 03/04/2019   Lab Results  Component Value Date   CREATININE 1.04 (H) 03/04/2019   BUN 16 03/04/2019   NA 140 03/04/2019   K 2.9 (L) 03/04/2019   CL 105 03/04/2019   CO2 28 03/04/2019   Lab Results  Component Value Date  ALT 12 03/03/2019   AST 16 03/03/2019   ALKPHOS 33 (L) 03/03/2019   BILITOT 0.6 03/03/2019     Assessment & Plan    1.  Preoperative cardiovascular evaluation: Patient with a history of difficult to control hypertension and subsegmental PE diagnosed in July of this year.  Blood pressure is now well controlled.  She has been anticoagulated on Eliquis since July 16.  Though she is completed 3 months of anticoagulation, given the unprovoked nature of her presumed PE, she remains on therapy.  She is scheduled for laparoscopic hysterectomy and bilateral salpingectomy on June 29, 2019.  We discussed bridging with Lovenox, which she is somewhat fearful of but willing to undertake (would plan to hold eliquis for 3 days prior to surgery).  I have advised I will refer her to hematology given unprovoked nature of PE, to discuss duration of therapy and potentially further hypercoagulable work-up.  I will also refer her to our anticoagulation clinic to discuss Lovenox bridging and provide education if ongoing anticoagulation deemed appropriate by hematology.  She has  good exercise tolerance with normal LV function by echo in July.  She does not require any ischemic evaluation prior to planned surgery and is low risk for cardiovascular complications.  2.  Essential hypertension: This is now well controlled on amlodipine, chlorthalidone, hydralazine, and labetalol therapy.  The we had previously discussed weaning hydralazine, she is tolerating twice daily dosing well and wishes to continue on the current regimen in an effort to not go backwards with her blood pressure management.  3.  Unprovoked subsegmental right upper lobe PE: Diagnosed in July of this year in the setting of pleuritic chest pain and dyspnea.  Artifact was noted on CT however, she has been on anticoagulation since.  Given unprovoked nature, I will refer to hematology for consideration of additional coagulopathy evaluation and direction regarding duration of oral anticoagulation.  4.  Menorrhagia: See #1.  5.  Disposition: 35 mins spent today discussing mgmt w/ Ms. Inthavong.  Refer to hematology.  Follow-up in our anticoag clinic for lovenox education.  Follow-up with Dr. And in 6 months or sooner if necessary.  Murray Hodgkins, NP 05/07/2019, 4:47 PM

## 2019-05-07 NOTE — Patient Instructions (Addendum)
Medication Instructions:  Your physician recommends that you continue on your current medications as directed. Please refer to the Current Medication list given to you today.  *If you need a refill on your cardiac medications before your next appointment, please call your pharmacy*  Lab Work: None ordered  If you have labs (blood work) drawn today and your tests are completely normal, you will receive your results only by: Marland Kitchen MyChart Message (if you have MyChart) OR . A paper copy in the mail If you have any lab test that is abnormal or we need to change your treatment, we will call you to review the results.  Testing/Procedures: None ordered   Follow-Up: At [facility], you and your health needs are our priority.  As part of our continuing mission to provide you with exceptional heart care, we have created designated Provider Care Teams.  These Care Teams include your primary Cardiologist (physician) and Advanced Practice Providers (APPs -  Physician Assistants and Nurse Practitioners) who all work together to provide you with the care you need, when you need it.  Your next appointment:   6 months  The format for your next appointment:   In Person  Provider:    You may see Nelva Bush, MD or Murray Hodgkins, NP.   Other Instructions 1- Ref to Hematology related to PE 2- Set up appt with [facility] for Lovenox teaching (December 2020)

## 2019-05-27 DIAGNOSIS — Z0289 Encounter for other administrative examinations: Secondary | ICD-10-CM

## 2019-06-01 ENCOUNTER — Telehealth: Payer: Self-pay

## 2019-06-01 NOTE — Telephone Encounter (Signed)
Pt called after hour nurse 05/31/19 5:16 stating she was returning call from office regarding FMLA paperwork; said she has already paid her fee for the paperwork.  After hour nurse gave office hours.  [phone]

## 2019-06-01 NOTE — Telephone Encounter (Signed)
Payment made 12/3.

## 2019-06-01 NOTE — Telephone Encounter (Signed)
Will you check on this? pts paperwork stated she would pay at pick up

## 2019-06-08 ENCOUNTER — Telehealth: Payer: Self-pay | Admitting: Obstetrics and Gynecology

## 2019-06-08 NOTE — Telephone Encounter (Signed)
Patient is aware that all surgeries with overnight stays are cancelled. Patient says she feels like her surgery is kind of an emergency, that every time she has a cycle she ends up in the hospital getting blood.

## 2019-06-09 ENCOUNTER — Other Ambulatory Visit: Payer: Self-pay

## 2019-06-09 ENCOUNTER — Ambulatory Visit (INDEPENDENT_AMBULATORY_CARE_PROVIDER_SITE_OTHER): Payer: BC Managed Care – PPO

## 2019-06-09 DIAGNOSIS — I2699 Other pulmonary embolism without acute cor pulmonale: Secondary | ICD-10-CM

## 2019-06-09 DIAGNOSIS — Z7901 Long term (current) use of anticoagulants: Secondary | ICD-10-CM

## 2019-06-09 MED ORDER — ENOXAPARIN SODIUM 80 MG/0.8ML ~~LOC~~ SOLN: 80.0000 mg | Freq: Two times a day (BID) | SUBCUTANEOUS | 1 refills | Status: DC

## 2019-06-09 NOTE — Patient Instructions (Signed)
Saturday, Jan 2: Inject Lovenox in the fatty tissue every 12 hours, 8am and 8pm. NO ELIQUIS  Sunday, Jan 3: Inject Lovenox in the fatty tissue every 12 hours, 8am and 8pm. NO ELIQUIS.  Monday, Jan 4: Inject Lovenox in the fatty tissue in the morning at 8 am (No PM dose). NO ELIQUIS  Tuesday, Jan 5: Procedure Day - NO LOVENOX.  NO ELIQUIS.  Wednesday, Jan 6: Resume Eliquis twice daily.

## 2019-06-14 NOTE — Telephone Encounter (Signed)
Per our discussion via Teams and the email chain you've been copied on, we'll proceed with her surgery in early January.

## 2019-06-21 ENCOUNTER — Other Ambulatory Visit: Payer: Self-pay

## 2019-06-21 ENCOUNTER — Ambulatory Visit (INDEPENDENT_AMBULATORY_CARE_PROVIDER_SITE_OTHER): Payer: BC Managed Care – PPO | Admitting: Obstetrics and Gynecology

## 2019-06-21 VITALS — BP 126/78 | Ht 62.0 in | Wt 180.0 lb

## 2019-06-21 DIAGNOSIS — D500 Iron deficiency anemia secondary to blood loss (chronic): Secondary | ICD-10-CM

## 2019-06-21 DIAGNOSIS — I2699 Other pulmonary embolism without acute cor pulmonale: Secondary | ICD-10-CM

## 2019-06-21 DIAGNOSIS — N921 Excessive and frequent menstruation with irregular cycle: Secondary | ICD-10-CM

## 2019-06-21 DIAGNOSIS — I10 Essential (primary) hypertension: Secondary | ICD-10-CM

## 2019-06-21 NOTE — H&P (View-Only) (Signed)
Preoperative History and Physical  Mary Roberts is a 38 y.o. [phone] here for surgical management of menorrhagia with irregular cycle and a history of severe anemia due to chronic blood loss.   Preoperative concerns relate to her history of difficult to control hypertension requiring multiple agents. For this she sees Dr. Saunders Revel who also has cleared her for surgery.  She also has a history of DVT and PE and is currently on treatment-dose Eliquis.  This is also currently managed by Dr. Saunders Revel.   History of Present Illness: The patient has a history of three hospital admissions due to severe acute blood loss anemia secondary to menstrual blood loss.  She has been admitted for blood transfusions and stabilization of her bleeding. A pelvic ultrasound in February showed a uterus that measured 10.9 x 5.9 x 5.8 cm. The endometrium had a heterogeneous echotexture with a fibroid that was located on the right within the endometrium (measuring 1.6 x 1.2 x 0.9 cm).  Her endometrium was indistinct suggesting adenomyosis. Her ovaries appeared normal.  She had a normal pap smear in January 2020 with negative screening for STDs.  She had an endometrial biopsy in 02/2019 that showed a secretory endometrium without evidence of a hyperplasia or malignancy.  In July this past year she was found to have a PE called a "small nonocclusive filling defect/embolus within the right upper lobe subsegmental branch vessel.  A correlative ultrasound duplex venous of the bilateral lower extremities did not show a DVT.  She has failed two IUD, both of which were expulsed not long after placement.  She has been offered ablation as an alternative to definitive surgery. However, with the suggestion of adenomyosis of ultrasound this may not be effective.  She presents for definitive management of multiple, detailed discussions regarding the risks of a hysterectomy given her comorbid conditions.   Proposed surgery: Total laparoscopic hysterectomy,  bilateral salpingectomy, cystoscopy  Past Medical History:  Diagnosis Date  . Anemia   . Anxiety    panic attacks  . Carpal tunnel syndrome, bilateral   . Chlamydia   . Epilepsy seizure, nonconvulsive, generalized ([place])   . Headache(784.0)    migraines  . History of C-section   . Hypertension   . Hypertensive heart disease   . Infection    UTI  . LVH (left ventricular hypertrophy)    a. 12/2018 Echo: EF 60-65%, sev inc LV wall thickness. Diast dysfxn. Triv AI.  Marland Kitchen Menorrhagia   . Migraines   . Pregnancy induced hypertension    with first  . Pulmonary embolism ([place])    a. 12/2018 CTA Chest (pleuritic c/p): probable small, nonocclusive PE w/in subsegmental RUL branch vessel. Artifact noted as well.  Eliquis initiatied.  . Seizures ([place])    No meds, off since in mid to late 20's  . UTI (urinary tract infection)    Past Surgical History:  Procedure Laterality Date  . CESAREAN SECTION    . MOUTH SURGERY    . TUBAL LIGATION N/A 10/30/2013   Procedure: POST PARTUM TUBAL LIGATION;  Surgeon: [name], MD;  Location: [facility];  Service: Gynecology;  Laterality: N/A;   OB History  Gravida Para Term Preterm AB Living  4 3 2 1 1 3   SAB TAB Ectopic Multiple Live Births    1     3    # Outcome Date GA Lbr Len/2nd Weight Sex Delivery Anes PTL Lv  4 Preterm 10/29/13 [redacted]w[redacted]d 09:38 / 00:03 6 lb 1.2  oz (2.756 kg) M VBAC EPI  LIV  3 Term 02/11/06 [redacted]w[redacted]d  8 lb 2 oz (3.685 kg) F VBAC  N LIV  2 Term 08/06/02 [redacted]w[redacted]d  7 lb 10 oz (3.459 kg) M CS-LTranv   LIV     Birth Comments: baby got stuck behind pelvic bone and needed csection, Had PIH  1 TAB           Patient denies any other pertinent gynecologic issues.   Current Outpatient Medications on File Prior to Visit  Medication Sig Dispense Refill  . amLODipine (NORVASC) 10 MG tablet Take 1 tablet (10 mg total) by mouth daily. 30 tablet 6  . apixaban (ELIQUIS) 5 MG TABS tablet Take 1 tablet (5 mg total) by mouth 2 (two) times daily. 60 tablet 6   . aspirin-acetaminophen-caffeine (EXCEDRIN MIGRAINE) 250-250-65 MG tablet Take 2 tablets by mouth every 6 (six) hours as needed for headache.     . chlorthalidone (HYGROTON) 25 MG tablet Take 1 tablet (25 mg total) by mouth daily. 30 tablet 6  . enoxaparin (LOVENOX) 80 MG/0.8ML injection Inject 0.8 mLs (80 mg total) into the skin every 12 (twelve) hours for 5 doses. 4 mL 1  . ferrous sulfate 325 (65 FE) MG EC tablet Take 1 tablet (325 mg total) by mouth 2 (two) times daily with a meal. (Patient taking differently: Take 325 mg by mouth daily with breakfast. ) 60 tablet 0  . hydrALAZINE (APRESOLINE) 25 MG tablet Take 1 tablet (25 mg) by mouth twice daily (Patient taking differently: Take 25 mg by mouth 2 (two) times daily. Take 1 tablet (25 mg) by mouth twice daily) 60 tablet 6  . labetalol (NORMODYNE) 200 MG tablet TAKE 2 TABLETS(400 MG) BY MOUTH TWICE DAILY (Patient taking differently: Take 200 mg by mouth 2 (two) times daily. ) 360 tablet 1  . potassium chloride SA (K-DUR) 20 MEQ tablet Take 1 tablet (20 mEq total) by mouth daily. (Patient not taking: Reported on 06/09/2019) 30 tablet 0  . Simethicone (GAS-X PO) Take 1 tablet by mouth daily as needed (trapped gas in chest).      No current facility-administered medications on file prior to visit.   Allergies  Allergen Reactions  . Amoxicillin Hives and Other (See Comments)    Has patient had a PCN reaction causing immediate rash, facial/tongue/throat swelling, SOB or lightheadedness with hypotension: No Has patient had a PCN reaction causing severe rash involving mucus membranes or skin necrosis: No Has patient had a PCN reaction that required hospitalization: No Has patient had a PCN reaction occurring within the last 10 years: Yes If all of the above answers are "NO", then may proceed with Cephalosporin use.     Social History:   reports that she has quit smoking. She has never used smokeless tobacco. She reports current alcohol use. She  reports that she does not use drugs.  Family History  Problem Relation Age of Onset  . Seizures Mother   . Hypertension Mother   . Diabetes Mother   . Heart disease Mother   . Stroke Mother   . Arthritis Mother   . Seizures Brother   . Hearing loss Neg Hx     Review of Systems:  Review of Systems  Constitutional: Negative.   HENT: Negative.   Eyes: Negative.   Respiratory: Negative.   Cardiovascular: Negative.   Gastrointestinal: Negative.   Genitourinary: Negative.   Musculoskeletal: Negative.   Skin: Negative.   Neurological: Negative.   Psychiatric/Behavioral:  Negative.      PHYSICAL EXAM: Blood pressure 126/78, height 5\' 2"  (1.575 m), weight 180 lb (81.6 kg). CONSTITUTIONAL: Well-developed, well-nourished female in no acute distress.  HENT:  Normocephalic, atraumatic, External right and left ear normal. Oropharynx is clear and moist EYES: Conjunctivae and EOM are normal. Pupils are equal, round, and reactive to light. No scleral icterus.  NECK: Normal range of motion, supple, no masses SKIN: Skin is warm and dry. No rash noted. Not diaphoretic. No erythema. No pallor. [place]: Alert and oriented to person, place, and time. Normal reflexes, muscle tone coordination. No cranial nerve deficit noted. PSYCHIATRIC: Normal mood and affect. Normal behavior. Normal judgment and thought content. CARDIOVASCULAR: Normal heart rate noted, regular rhythm RESPIRATORY: Effort and breath sounds normal, no problems with respiration noted ABDOMEN: Soft, nontender, nondistended. PELVIC: Deferred MUSCULOSKELETAL: Normal range of motion. No edema and no tenderness. 2+ distal pulses.  Assessment:   ICD-10-CM   1. Menorrhagia with irregular cycle  N92.1   2. Anemia due to chronic blood loss  D50.0   3. Essential hypertension  I10   4. Acute pulmonary embolism without acute cor pulmonale, unspecified pulmonary embolism type (HCC)  I26.99     Plan: Patient will undergo surgical  management with the above-named surgery.   The risks of surgery were discussed in detail with the patient including but not limited to: bleeding which may require transfusion or reoperation; infection which may require antibiotics; injury to surrounding organs which may involve bowel, bladder, ureters ; need for additional procedures including laparoscopy or laparotomy; thromboembolic phenomenon, surgical site problems and other postoperative/anesthesia complications. Likelihood of success in alleviating the patient's condition was discussed. Routine postoperative instructions will be reviewed with the patient and her family in detail after surgery.  The patient concurred with the proposed plan, giving informed written consent for the surgery.  Preoperative prophylactic antibiotics, as indicated, and SCDs ordered on call to the OR.  I have discussed the case with Dr. Saunders Revel. The VTE prophylactic plan will be to have the patient convert from Eliquis 5 mg PO BID to Lovenox 90 mg [place] twice daily starting 1/2 and repeating on 1/3.  On 1/4 take lovenox 80 mg [place] in the morning with no evening dose. On 1/5 (day of surgery) I will give her lovenox 40 mg [place] prior to surgery.  On 1/6 we will continue lovenox 40 mg [place] daily.  On 1/7, returning to full-dose Eliquis 5 mg PO BID provided that she has hemodynamic stability.  Per recommendation from Dr. Saunders Revel, will hold chlorthalidone day of surgery and restart the medication once she is hydrating well and continuing other antihypertensive, unless anesthesia has a specific issue with any of the other medications. This has been outlined to the patient and I will send her a message to outline the plan for both medication regimens.    Prentice Docker, MD 06/21/2019 4:35 PM

## 2019-06-21 NOTE — Progress Notes (Signed)
Preoperative History and Physical  Mary Roberts is a 38 y.o. [phone] here for surgical management of menorrhagia with irregular cycle and a history of severe anemia due to chronic blood loss.   Preoperative concerns relate to her history of difficult to control hypertension requiring multiple agents. For this she sees Dr. Saunders Revel who also has cleared her for surgery.  She also has a history of DVT and PE and is currently on treatment-dose Eliquis.  This is also currently managed by Dr. Saunders Revel.   History of Present Illness: The patient has a history of three hospital admissions due to severe acute blood loss anemia secondary to menstrual blood loss.  She has been admitted for blood transfusions and stabilization of her bleeding. A pelvic ultrasound in February showed a uterus that measured 10.9 x 5.9 x 5.8 cm. The endometrium had a heterogeneous echotexture with a fibroid that was located on the right within the endometrium (measuring 1.6 x 1.2 x 0.9 cm).  Her endometrium was indistinct suggesting adenomyosis. Her ovaries appeared normal.  She had a normal pap smear in January 2020 with negative screening for STDs.  She had an endometrial biopsy in 02/2019 that showed a secretory endometrium without evidence of a hyperplasia or malignancy.  In July this past year she was found to have a PE called a "small nonocclusive filling defect/embolus within the right upper lobe subsegmental branch vessel.  A correlative ultrasound duplex venous of the bilateral lower extremities did not show a DVT.  She has failed two IUD, both of which were expulsed not long after placement.  She has been offered ablation as an alternative to definitive surgery. However, with the suggestion of adenomyosis of ultrasound this may not be effective.  She presents for definitive management of multiple, detailed discussions regarding the risks of a hysterectomy given her comorbid conditions.   Proposed surgery: Total laparoscopic hysterectomy,  bilateral salpingectomy, cystoscopy  Past Medical History:  Diagnosis Date  . Anemia   . Anxiety    panic attacks  . Carpal tunnel syndrome, bilateral   . Chlamydia   . Epilepsy seizure, nonconvulsive, generalized ([place])   . Headache(784.0)    migraines  . History of C-section   . Hypertension   . Hypertensive heart disease   . Infection    UTI  . LVH (left ventricular hypertrophy)    a. 12/2018 Echo: EF 60-65%, sev inc LV wall thickness. Diast dysfxn. Triv AI.  Marland Kitchen Menorrhagia   . Migraines   . Pregnancy induced hypertension    with first  . Pulmonary embolism ([place])    a. 12/2018 CTA Chest (pleuritic c/p): probable small, nonocclusive PE w/in subsegmental RUL branch vessel. Artifact noted as well.  Eliquis initiatied.  . Seizures ([place])    No meds, off since in mid to late 20's  . UTI (urinary tract infection)    Past Surgical History:  Procedure Laterality Date  . CESAREAN SECTION    . MOUTH SURGERY    . TUBAL LIGATION N/A 10/30/2013   Procedure: POST PARTUM TUBAL LIGATION;  Surgeon: [name], MD;  Location: [facility];  Service: Gynecology;  Laterality: N/A;   OB History  Gravida Para Term Preterm AB Living  4 3 2 1 1 3   SAB TAB Ectopic Multiple Live Births    1     3    # Outcome Date GA Lbr Len/2nd Weight Sex Delivery Anes PTL Lv  4 Preterm 10/29/13 [redacted]w[redacted]d 09:38 / 00:03 6 lb 1.2  oz (2.756 kg) M VBAC EPI  LIV  3 Term 02/11/06 109w1d  8 lb 2 oz (3.685 kg) F VBAC  N LIV  2 Term 08/06/02 [redacted]w[redacted]d  7 lb 10 oz (3.459 kg) M CS-LTranv   LIV     Birth Comments: baby got stuck behind pelvic bone and needed csection, Had PIH  1 TAB           Patient denies any other pertinent gynecologic issues.   Current Outpatient Medications on File Prior to Visit  Medication Sig Dispense Refill  . amLODipine (NORVASC) 10 MG tablet Take 1 tablet (10 mg total) by mouth daily. 30 tablet 6  . apixaban (ELIQUIS) 5 MG TABS tablet Take 1 tablet (5 mg total) by mouth 2 (two) times daily. 60 tablet 6   . aspirin-acetaminophen-caffeine (EXCEDRIN MIGRAINE) 250-250-65 MG tablet Take 2 tablets by mouth every 6 (six) hours as needed for headache.     . chlorthalidone (HYGROTON) 25 MG tablet Take 1 tablet (25 mg total) by mouth daily. 30 tablet 6  . enoxaparin (LOVENOX) 80 MG/0.8ML injection Inject 0.8 mLs (80 mg total) into the skin every 12 (twelve) hours for 5 doses. 4 mL 1  . ferrous sulfate 325 (65 FE) MG EC tablet Take 1 tablet (325 mg total) by mouth 2 (two) times daily with a meal. (Patient taking differently: Take 325 mg by mouth daily with breakfast. ) 60 tablet 0  . hydrALAZINE (APRESOLINE) 25 MG tablet Take 1 tablet (25 mg) by mouth twice daily (Patient taking differently: Take 25 mg by mouth 2 (two) times daily. Take 1 tablet (25 mg) by mouth twice daily) 60 tablet 6  . labetalol (NORMODYNE) 200 MG tablet TAKE 2 TABLETS(400 MG) BY MOUTH TWICE DAILY (Patient taking differently: Take 200 mg by mouth 2 (two) times daily. ) 360 tablet 1  . potassium chloride SA (K-DUR) 20 MEQ tablet Take 1 tablet (20 mEq total) by mouth daily. (Patient not taking: Reported on 06/09/2019) 30 tablet 0  . Simethicone (GAS-X PO) Take 1 tablet by mouth daily as needed (trapped gas in chest).      No current facility-administered medications on file prior to visit.   Allergies  Allergen Reactions  . Amoxicillin Hives and Other (See Comments)    Has patient had a PCN reaction causing immediate rash, facial/tongue/throat swelling, SOB or lightheadedness with hypotension: No Has patient had a PCN reaction causing severe rash involving mucus membranes or skin necrosis: No Has patient had a PCN reaction that required hospitalization: No Has patient had a PCN reaction occurring within the last 10 years: Yes If all of the above answers are "NO", then may proceed with Cephalosporin use.     Social History:   reports that she has quit smoking. She has never used smokeless tobacco. She reports current alcohol use. She  reports that she does not use drugs.  Family History  Problem Relation Age of Onset  . Seizures Mother   . Hypertension Mother   . Diabetes Mother   . Heart disease Mother   . Stroke Mother   . Arthritis Mother   . Seizures Brother   . Hearing loss Neg Hx     Review of Systems:  Review of Systems  Constitutional: Negative.   HENT: Negative.   Eyes: Negative.   Respiratory: Negative.   Cardiovascular: Negative.   Gastrointestinal: Negative.   Genitourinary: Negative.   Musculoskeletal: Negative.   Skin: Negative.   Neurological: Negative.   Psychiatric/Behavioral:  Negative.      PHYSICAL EXAM: Blood pressure 126/78, height 5\' 2"  (1.575 m), weight 180 lb (81.6 kg). CONSTITUTIONAL: Well-developed, well-nourished female in no acute distress.  HENT:  Normocephalic, atraumatic, External right and left ear normal. Oropharynx is clear and moist EYES: Conjunctivae and EOM are normal. Pupils are equal, round, and reactive to light. No scleral icterus.  NECK: Normal range of motion, supple, no masses SKIN: Skin is warm and dry. No rash noted. Not diaphoretic. No erythema. No pallor. [place]: Alert and oriented to person, place, and time. Normal reflexes, muscle tone coordination. No cranial nerve deficit noted. PSYCHIATRIC: Normal mood and affect. Normal behavior. Normal judgment and thought content. CARDIOVASCULAR: Normal heart rate noted, regular rhythm RESPIRATORY: Effort and breath sounds normal, no problems with respiration noted ABDOMEN: Soft, nontender, nondistended. PELVIC: Deferred MUSCULOSKELETAL: Normal range of motion. No edema and no tenderness. 2+ distal pulses.  Assessment:   ICD-10-CM   1. Menorrhagia with irregular cycle  N92.1   2. Anemia due to chronic blood loss  D50.0   3. Essential hypertension  I10   4. Acute pulmonary embolism without acute cor pulmonale, unspecified pulmonary embolism type (HCC)  I26.99     Plan: Patient will undergo surgical  management with the above-named surgery.   The risks of surgery were discussed in detail with the patient including but not limited to: bleeding which may require transfusion or reoperation; infection which may require antibiotics; injury to surrounding organs which may involve bowel, bladder, ureters ; need for additional procedures including laparoscopy or laparotomy; thromboembolic phenomenon, surgical site problems and other postoperative/anesthesia complications. Likelihood of success in alleviating the patient's condition was discussed. Routine postoperative instructions will be reviewed with the patient and her family in detail after surgery.  The patient concurred with the proposed plan, giving informed written consent for the surgery.  Preoperative prophylactic antibiotics, as indicated, and SCDs ordered on call to the OR.  I have discussed the case with Dr. Saunders Revel. The VTE prophylactic plan will be to have the patient convert from Eliquis 5 mg PO BID to Lovenox 90 mg [place] twice daily starting 1/2 and repeating on 1/3.  On 1/4 take lovenox 80 mg [place] in the morning with no evening dose. On 1/5 (day of surgery) I will give her lovenox 40 mg [place] prior to surgery.  On 1/6 we will continue lovenox 40 mg [place] daily.  On 1/7, returning to full-dose Eliquis 5 mg PO BID provided that she has hemodynamic stability.  Per recommendation from Dr. Saunders Revel, will hold chlorthalidone day of surgery and restart the medication once she is hydrating well and continuing other antihypertensive, unless anesthesia has a specific issue with any of the other medications. This has been outlined to the patient and I will send her a message to outline the plan for both medication regimens.    Prentice Docker, MD 06/21/2019 4:35 PM

## 2019-06-22 ENCOUNTER — Encounter: Payer: Self-pay | Admitting: Obstetrics and Gynecology

## 2019-06-22 ENCOUNTER — Encounter
Admission: RE | Admit: 2019-06-22 | Discharge: 2019-06-22 | Disposition: A | Discharge status: Home / Self Care | Payer: BC Managed Care – PPO | Source: Ambulatory Visit | Attending: Obstetrics and Gynecology | Admitting: Obstetrics and Gynecology

## 2019-06-22 NOTE — Patient Instructions (Signed)
Your procedure is scheduled on: Tuesday June 29, 2019 Report to Day Surgery on the 2nd floor of the [facility]. To find out your arrival time, please call [phone] between 1PM - 3PM on: Monday June 28, 2019  REMEMBER: Instructions that are not followed completely may result in serious medical risk, up to and including death; or upon the discretion of your surgeon and anesthesiologist your surgery may need to be rescheduled.  Do not eat food after midnight the night before surgery.  No gum chewing, lozengers or hard candies.  You may however, drink CLEAR liquids up to 2 hours before you are scheduled to arrive for your surgery. Do not drink anything within 2 hours of the start of your surgery.  Clear liquids include: - water  - apple juice without pulp - gatorade - black coffee or tea (Do NOT add milk or creamers to the coffee or tea) Do NOT drink anything that is not on this list.  Type 1 and Type 2 diabetics should only drink water.  ENSURE PRE-SURGERY CARBOHYDRATE DRINK:  Complete drinking 2 hours before arrival at the hospital  No Alcohol for 24 hours before or after surgery.  No Smoking including e-cigarettes for 24 hours prior to surgery.  No chewable tobacco products for at least 6 hours prior to surgery.  No nicotine patches on the day of surgery.  On the morning of surgery brush your teeth with toothpaste and water, you may rinse your mouth with mouthwash if you wish. Do not swallow any toothpaste or mouthwash.  Notify your doctor if there is any change in your medical condition (cold, fever, infection).  Do not wear jewelry, make-up, hairpins, clips or nail polish.  Do not wear lotions, powders, or perfumes.   Do not shave 48 hours prior to surgery.   Contacts and dentures may not be worn into surgery.  Do not bring valuables to the hospital, including drivers license, insurance or credit cards.  [facility] is not responsible for any belongings or  valuables.   TAKE THESE MEDICATIONS THE MORNING OF SURGERY: HYDRALAZINE LABETOLOL  Use CHG Soap or wipes as directed on instruction sheet.  Follow recommendations from Cardiologist, Pulmonologist or PCP regarding stopping Eliquis.   Stop Anti-inflammatories (NSAIDS) such as Advil, Aleve, Ibuprofen, Motrin, Naproxen, Naprosyn and Aspirin based products such as Excedrin, Goodys Powder, BC Powder  (May take Tylenol or Acetaminophen if needed.)  Stop ANY OVER THE COUNTER supplements until after surgery. (May continue Vitamin D, Vitamin B, and multivitamin.)  Wear comfortable clothing (specific to your surgery type) to the hospital.  Plan for stool softeners for home use.  If you are being discharged the day of surgery, you will not be allowed to drive home. You will need a responsible adult to drive you home and stay with you that night.   If you are taking public transportation, you will need to have a responsible adult with you. Please confirm with your physician that it is acceptable to use public transportation.   Please call [phone] if you have any questions about these instructions.

## 2019-06-24 ENCOUNTER — Other Ambulatory Visit
Admission: RE | Admit: 2019-06-24 | Discharge: 2019-06-24 | Disposition: A | Discharge status: Home / Self Care | Payer: BC Managed Care – PPO | Source: Ambulatory Visit | Attending: Obstetrics and Gynecology | Admitting: Obstetrics and Gynecology

## 2019-06-24 ENCOUNTER — Other Ambulatory Visit: Payer: Self-pay

## 2019-06-24 DIAGNOSIS — Z01812 Encounter for preprocedural laboratory examination: Secondary | ICD-10-CM | POA: Insufficient documentation

## 2019-06-24 DIAGNOSIS — Z20828 Contact with and (suspected) exposure to other viral communicable diseases: Secondary | ICD-10-CM | POA: Insufficient documentation

## 2019-06-24 LAB — COMPREHENSIVE METABOLIC PANEL
ALT: 15 U/L (ref 0–44)
AST: 29 U/L (ref 15–41)
Albumin: 4.4 g/dL (ref 3.5–5.0)
Alkaline Phosphatase: 41 U/L (ref 38–126)
Anion gap: 10 (ref 5–15)
BUN: 10 mg/dL (ref 6–20)
CO2: 27 mmol/L (ref 22–32)
Calcium: 9 mg/dL (ref 8.9–10.3)
Chloride: 99 mmol/L (ref 98–111)
Creatinine, Ser: 0.97 mg/dL (ref 0.44–1.00)
GFR calc Af Amer: >60 mL/min (ref >60)
GFR calc non Af Amer: >60 mL/min (ref >60)
Glucose, Bld: 86 mg/dL (ref 70–99)
Potassium: 2.9 mmol/L — ABNORMAL LOW (ref 3.5–5.1)
Sodium: 136 mmol/L (ref 135–145)
Total Bilirubin: 0.5 mg/dL (ref 0.3–1.2)
Total Protein: 8.2 g/dL — ABNORMAL HIGH (ref 6.5–8.1)

## 2019-06-24 LAB — CBC
HCT: 31.9 % — ABNORMAL LOW (ref 36.0–46.0)
Hemoglobin: 9.9 g/dL — ABNORMAL LOW (ref 12.0–15.0)
MCH: 26.7 pg (ref 26.0–34.0)
MCHC: 31 g/dL (ref 30.0–36.0)
MCV: 86 fL (ref 80.0–100.0)
Platelets: 281 10*3/uL (ref 150–400)
RBC: 3.71 MIL/uL — ABNORMAL LOW (ref 3.87–5.11)
RDW: 14.1 % (ref 11.5–15.5)
WBC: 5.9 10*3/uL (ref 4.0–10.5)
nRBC: 0 % (ref 0.0–0.2)

## 2019-06-24 LAB — SARS CORONAVIRUS 2 (TAT 6-24 HRS): SARS Coronavirus 2: NEGATIVE

## 2019-06-28 ENCOUNTER — Other Ambulatory Visit: Payer: Self-pay | Admitting: Obstetrics and Gynecology

## 2019-06-28 ENCOUNTER — Telehealth: Payer: Self-pay

## 2019-06-28 DIAGNOSIS — E876 Hypokalemia: Secondary | ICD-10-CM

## 2019-06-28 MED ORDER — CLINDAMYCIN PHOSPHATE 900 MG/50ML IV SOLN
900.0000 mg | INTRAVENOUS | Status: AC
  Administered 2019-06-29: 11:00:00 900 mg via INTRAVENOUS

## 2019-06-28 MED ORDER — POTASSIUM CHLORIDE CRYS ER 20 MEQ PO TBCR: 40.0000 meq | EXTENDED_RELEASE_TABLET | Freq: Two times a day (BID) | ORAL | 0 refills | Status: DC

## 2019-06-28 MED ORDER — GENTAMICIN SULFATE 40 MG/ML IJ SOLN
5.0000 mg/kg | INTRAVENOUS | Status: AC
  Administered 2019-06-29: 410 mg via INTRAVENOUS
  Filled 2019-06-28: qty 10.25

## 2019-06-28 NOTE — Telephone Encounter (Signed)
Called pt to let her know Rx was called in before tomorrows surgery due to low potassium level. Pt aware.

## 2019-06-28 NOTE — Telephone Encounter (Signed)
Pt called and said pharmacy just gave her her medication and took the first dose, will it be ok to take the second dose later on. Yes per SDJ, pt aware.

## 2019-06-29 ENCOUNTER — Encounter
Admission: RE | Disposition: A | Discharge status: Home / Self Care | Payer: Self-pay | Source: Home / Self Care | Attending: Obstetrics and Gynecology

## 2019-06-29 ENCOUNTER — Other Ambulatory Visit: Payer: Self-pay

## 2019-06-29 ENCOUNTER — Ambulatory Visit: Payer: BC Managed Care – PPO | Admitting: Anesthesiology

## 2019-06-29 ENCOUNTER — Encounter: Payer: Self-pay | Admitting: Obstetrics and Gynecology

## 2019-06-29 ENCOUNTER — Ambulatory Visit
Admission: RE | Admit: 2019-06-29 | Discharge: 2019-06-29 | Disposition: A | Discharge status: Home / Self Care | Payer: BC Managed Care – PPO | Attending: Obstetrics and Gynecology | Admitting: Obstetrics and Gynecology

## 2019-06-29 DIAGNOSIS — N920 Excessive and frequent menstruation with regular cycle: Secondary | ICD-10-CM | POA: Diagnosis not present

## 2019-06-29 DIAGNOSIS — Z7901 Long term (current) use of anticoagulants: Secondary | ICD-10-CM | POA: Insufficient documentation

## 2019-06-29 DIAGNOSIS — I10 Essential (primary) hypertension: Secondary | ICD-10-CM

## 2019-06-29 DIAGNOSIS — D500 Iron deficiency anemia secondary to blood loss (chronic): Secondary | ICD-10-CM | POA: Diagnosis not present

## 2019-06-29 DIAGNOSIS — N800 Endometriosis of uterus: Secondary | ICD-10-CM | POA: Diagnosis not present

## 2019-06-29 DIAGNOSIS — D251 Intramural leiomyoma of uterus: Secondary | ICD-10-CM | POA: Diagnosis not present

## 2019-06-29 DIAGNOSIS — N888 Other specified noninflammatory disorders of cervix uteri: Secondary | ICD-10-CM | POA: Diagnosis not present

## 2019-06-29 DIAGNOSIS — Z90710 Acquired absence of both cervix and uterus: Secondary | ICD-10-CM | POA: Diagnosis present

## 2019-06-29 DIAGNOSIS — I119 Hypertensive heart disease without heart failure: Secondary | ICD-10-CM | POA: Diagnosis not present

## 2019-06-29 DIAGNOSIS — E669 Obesity, unspecified: Secondary | ICD-10-CM | POA: Diagnosis not present

## 2019-06-29 DIAGNOSIS — N838 Other noninflammatory disorders of ovary, fallopian tube and broad ligament: Secondary | ICD-10-CM

## 2019-06-29 DIAGNOSIS — Z86711 Personal history of pulmonary embolism: Secondary | ICD-10-CM | POA: Diagnosis not present

## 2019-06-29 DIAGNOSIS — Z87891 Personal history of nicotine dependence: Secondary | ICD-10-CM | POA: Insufficient documentation

## 2019-06-29 DIAGNOSIS — D649 Anemia, unspecified: Secondary | ICD-10-CM | POA: Diagnosis not present

## 2019-06-29 DIAGNOSIS — Z79899 Other long term (current) drug therapy: Secondary | ICD-10-CM | POA: Diagnosis not present

## 2019-06-29 DIAGNOSIS — I2699 Other pulmonary embolism without acute cor pulmonale: Secondary | ICD-10-CM

## 2019-06-29 HISTORY — PX: CYSTOSCOPY: SHX5120

## 2019-06-29 HISTORY — PX: TOTAL LAPAROSCOPIC HYSTERECTOMY WITH SALPINGECTOMY: SHX6742

## 2019-06-29 LAB — TYPE AND SCREEN
ABO/RH(D): O POS
Antibody Screen: NEGATIVE

## 2019-06-29 LAB — POCT I-STAT, CHEM 8
BUN: 12 mg/dL (ref 6–20)
Calcium, Ion: 1.26 mmol/L (ref 1.15–1.40)
Chloride: 103 mmol/L (ref 98–111)
Creatinine, Ser: 1 mg/dL (ref 0.44–1.00)
Glucose, Bld: 108 mg/dL — ABNORMAL HIGH (ref 70–99)
HCT: 33 % — ABNORMAL LOW (ref 36.0–46.0)
Hemoglobin: 11.2 g/dL — ABNORMAL LOW (ref 12.0–15.0)
Potassium: 3.1 mmol/L — ABNORMAL LOW (ref 3.5–5.1)
Sodium: 140 mmol/L (ref 135–145)
TCO2: 27 mmol/L (ref 22–32)

## 2019-06-29 LAB — POCT PREGNANCY, URINE: Preg Test, Ur: NEGATIVE

## 2019-06-29 SURGERY — HYSTERECTOMY, TOTAL, LAPAROSCOPIC, WITH SALPINGECTOMY
Anesthesia: General

## 2019-06-29 MED ORDER — HEMOSTATIC AGENTS (NO CHARGE) OPTIME
TOPICAL | Status: DC | PRN
  Administered 2019-06-29: 1 via TOPICAL

## 2019-06-29 MED ORDER — LIDOCAINE HCL (PF) 2 % IJ SOLN
INTRAMUSCULAR | Status: AC
  Filled 2019-06-29: qty 10

## 2019-06-29 MED ORDER — FAMOTIDINE 20 MG PO TABS
ORAL_TABLET | ORAL | Status: AC
  Administered 2019-06-29: 09:00:00 20 mg via ORAL
  Filled 2019-06-29: qty 1

## 2019-06-29 MED ORDER — CLINDAMYCIN PHOSPHATE 900 MG/50ML IV SOLN
INTRAVENOUS | Status: AC
  Filled 2019-06-29: qty 50

## 2019-06-29 MED ORDER — PROMETHAZINE HCL 25 MG/ML IJ SOLN: 6.2500 mg | INTRAMUSCULAR | Status: DC | PRN

## 2019-06-29 MED ORDER — FAMOTIDINE 20 MG PO TABS: 20.0000 mg | ORAL_TABLET | Freq: Once | ORAL | Status: AC

## 2019-06-29 MED ORDER — SUGAMMADEX SODIUM 500 MG/5ML IV SOLN
INTRAVENOUS | Status: DC | PRN
  Administered 2019-06-29: 167 mg via INTRAVENOUS

## 2019-06-29 MED ORDER — PROPOFOL 10 MG/ML IV BOLUS
INTRAVENOUS | Status: DC | PRN
  Administered 2019-06-29: 160 mg via INTRAVENOUS

## 2019-06-29 MED ORDER — FERROUS SULFATE 325 (65 FE) MG PO TBEC: 325.0000 mg | DELAYED_RELEASE_TABLET | Freq: Every day | ORAL | Status: DC

## 2019-06-29 MED ORDER — LABETALOL HCL 200 MG PO TABS: 200.0000 mg | ORAL_TABLET | Freq: Two times a day (BID) | ORAL | Status: DC

## 2019-06-29 MED ORDER — ONDANSETRON HCL 4 MG/2ML IJ SOLN
INTRAMUSCULAR | Status: DC | PRN
  Administered 2019-06-29: 4 mg via INTRAVENOUS

## 2019-06-29 MED ORDER — OXYCODONE-ACETAMINOPHEN 5-325 MG PO TABS: 1.0000 | ORAL_TABLET | ORAL | 0 refills | Status: DC | PRN

## 2019-06-29 MED ORDER — ONDANSETRON 4 MG PO TBDP: 4.0000 mg | ORAL_TABLET | Freq: Three times a day (TID) | ORAL | 0 refills | Status: DC | PRN

## 2019-06-29 MED ORDER — IBUPROFEN 600 MG PO TABS: 600.0000 mg | ORAL_TABLET | Freq: Three times a day (TID) | ORAL | 0 refills | Status: DC

## 2019-06-29 MED ORDER — SUGAMMADEX SODIUM 500 MG/5ML IV SOLN
INTRAVENOUS | Status: AC
  Filled 2019-06-29: qty 10

## 2019-06-29 MED ORDER — GLYCOPYRROLATE 0.2 MG/ML IJ SOLN
INTRAMUSCULAR | Status: DC | PRN
  Administered 2019-06-29: .2 mg via INTRAVENOUS

## 2019-06-29 MED ORDER — ENOXAPARIN SODIUM 40 MG/0.4ML ~~LOC~~ SOLN
40.0000 mg | SUBCUTANEOUS | Status: AC
  Administered 2019-06-29: 09:00:00 40 mg via SUBCUTANEOUS
  Filled 2019-06-29: qty 0.4

## 2019-06-29 MED ORDER — PROPOFOL 10 MG/ML IV BOLUS
INTRAVENOUS | Status: AC
  Filled 2019-06-29: qty 40

## 2019-06-29 MED ORDER — DEXAMETHASONE SODIUM PHOSPHATE 10 MG/ML IJ SOLN
INTRAMUSCULAR | Status: DC | PRN
  Administered 2019-06-29: 10 mg via INTRAVENOUS

## 2019-06-29 MED ORDER — ONDANSETRON HCL 4 MG/2ML IJ SOLN
INTRAMUSCULAR | Status: AC
  Filled 2019-06-29: qty 4

## 2019-06-29 MED ORDER — ENOXAPARIN SODIUM 80 MG/0.8ML ~~LOC~~ SOLN: 40.0000 mg | SUBCUTANEOUS | 1 refills | Status: DC

## 2019-06-29 MED ORDER — MEPERIDINE HCL 50 MG/ML IJ SOLN: 6.2500 mg | INTRAMUSCULAR | Status: DC | PRN

## 2019-06-29 MED ORDER — ROCURONIUM BROMIDE 50 MG/5ML IV SOLN
INTRAVENOUS | Status: AC
  Filled 2019-06-29: qty 2

## 2019-06-29 MED ORDER — MIDAZOLAM HCL 2 MG/2ML IJ SOLN
INTRAMUSCULAR | Status: DC | PRN
  Administered 2019-06-29: 2 mg via INTRAVENOUS

## 2019-06-29 MED ORDER — PHENYLEPHRINE HCL (PRESSORS) 10 MG/ML IV SOLN
INTRAVENOUS | Status: DC | PRN
  Administered 2019-06-29 (×4): 100 ug via INTRAVENOUS

## 2019-06-29 MED ORDER — FENTANYL CITRATE (PF) 100 MCG/2ML IJ SOLN
INTRAMUSCULAR | Status: AC
  Filled 2019-06-29: qty 2

## 2019-06-29 MED ORDER — LIDOCAINE HCL (CARDIAC) PF 100 MG/5ML IV SOSY
PREFILLED_SYRINGE | INTRAVENOUS | Status: DC | PRN
  Administered 2019-06-29: 100 mg via INTRAVENOUS

## 2019-06-29 MED ORDER — DEXAMETHASONE SODIUM PHOSPHATE 10 MG/ML IJ SOLN
INTRAMUSCULAR | Status: AC
  Filled 2019-06-29: qty 1

## 2019-06-29 MED ORDER — HYDROMORPHONE HCL 1 MG/ML IJ SOLN
INTRAMUSCULAR | Status: DC | PRN
  Administered 2019-06-29 (×2): .5 mg via INTRAVENOUS

## 2019-06-29 MED ORDER — HYDRALAZINE HCL 25 MG PO TABS: 25.0000 mg | ORAL_TABLET | Freq: Two times a day (BID) | ORAL | Status: DC

## 2019-06-29 MED ORDER — HYDROMORPHONE HCL 1 MG/ML IJ SOLN
INTRAMUSCULAR | Status: AC
  Filled 2019-06-29: qty 1

## 2019-06-29 MED ORDER — ROCURONIUM BROMIDE 100 MG/10ML IV SOLN
INTRAVENOUS | Status: DC | PRN
  Administered 2019-06-29: 50 mg via INTRAVENOUS

## 2019-06-29 MED ORDER — MIDAZOLAM HCL 2 MG/2ML IJ SOLN
INTRAMUSCULAR | Status: AC
  Filled 2019-06-29: qty 2

## 2019-06-29 MED ORDER — OXYCODONE HCL 5 MG PO TABS: 5.0000 mg | ORAL_TABLET | Freq: Once | ORAL | Status: DC | PRN

## 2019-06-29 MED ORDER — FENTANYL CITRATE (PF) 100 MCG/2ML IJ SOLN: 25.0000 ug | INTRAMUSCULAR | Status: DC | PRN

## 2019-06-29 MED ORDER — ACETAMINOPHEN 10 MG/ML IV SOLN
INTRAVENOUS | Status: DC | PRN
  Administered 2019-06-29: 1000 mg via INTRAVENOUS

## 2019-06-29 MED ORDER — LACTATED RINGERS IV SOLN: INTRAVENOUS | Status: DC

## 2019-06-29 MED ORDER — ACETAMINOPHEN NICU IV SYRINGE 10 MG/ML
INTRAVENOUS | Status: AC
  Filled 2019-06-29: qty 1

## 2019-06-29 MED ORDER — OXYCODONE HCL 5 MG/5ML PO SOLN: 5.0000 mg | Freq: Once | ORAL | Status: DC | PRN

## 2019-06-29 MED ORDER — BUPIVACAINE HCL 0.5 % IJ SOLN
INTRAMUSCULAR | Status: DC | PRN
  Administered 2019-06-29: 8 mL

## 2019-06-29 MED ORDER — APIXABAN 5 MG PO TABS: 5.0000 mg | ORAL_TABLET | Freq: Two times a day (BID) | ORAL | 6 refills | Status: DC

## 2019-06-29 MED ORDER — PHENYLEPHRINE HCL (PRESSORS) 10 MG/ML IV SOLN
INTRAVENOUS | Status: AC
  Filled 2019-06-29: qty 1

## 2019-06-29 MED ORDER — EPHEDRINE SULFATE 50 MG/ML IJ SOLN
INTRAMUSCULAR | Status: AC
  Filled 2019-06-29: qty 1

## 2019-06-29 MED ORDER — FENTANYL CITRATE (PF) 100 MCG/2ML IJ SOLN
INTRAMUSCULAR | Status: DC | PRN
  Administered 2019-06-29: 50 ug via INTRAVENOUS

## 2019-06-29 MED ORDER — GLYCOPYRROLATE 0.2 MG/ML IJ SOLN
INTRAMUSCULAR | Status: AC
  Filled 2019-06-29: qty 1

## 2019-06-29 SURGICAL SUPPLY — 59 items
APPLICATOR ARISTA FLEXITIP XL (MISCELLANEOUS) ×1 IMPLANT
BAG URINE DRAIN 2000ML AR STRL (UROLOGICAL SUPPLIES) ×2 IMPLANT
BLADE SURG SZ11 CARB STEEL (BLADE) ×2 IMPLANT
CATH FOLEY 2WAY  5CC 16FR (CATHETERS) ×1
CATH URTH 16FR FL 2W BLN LF (CATHETERS) ×1 IMPLANT
CHLORAPREP W/TINT 26 (MISCELLANEOUS) ×2 IMPLANT
COVER WAND RF STERILE (DRAPES) ×2 IMPLANT
DEFOGGER SCOPE WARMER CLEARIFY (MISCELLANEOUS) ×2 IMPLANT
DERMABOND ADVANCED (GAUZE/BANDAGES/DRESSINGS) ×1
DERMABOND ADVANCED .7 DNX12 (GAUZE/BANDAGES/DRESSINGS) ×1 IMPLANT
DEVICE SUTURE ENDOST 10MM (ENDOMECHANICALS) IMPLANT
DRAPE 3/4 80X56 (DRAPES) ×2 IMPLANT
DRAPE LEGGINS SURG 28X43 STRL (DRAPES) ×2 IMPLANT
DRAPE UNDER BUTTOCK W/FLU (DRAPES) ×2 IMPLANT
GAUZE 4X4 16PLY RFD (DISPOSABLE) ×2 IMPLANT
GLOVE BIO SURGEON STRL SZ7 (GLOVE) ×6 IMPLANT
GLOVE BIOGEL PI IND STRL 7.5 (GLOVE) ×1 IMPLANT
GLOVE BIOGEL PI INDICATOR 7.5 (GLOVE) ×1
GLOVE INDICATOR 7.5 STRL GRN (GLOVE) ×2 IMPLANT
GOWN STRL REUS W/ TWL LRG LVL3 (GOWN DISPOSABLE) ×3 IMPLANT
GOWN STRL REUS W/ TWL XL LVL3 (GOWN DISPOSABLE) ×1 IMPLANT
GOWN STRL REUS W/TWL LRG LVL3 (GOWN DISPOSABLE) ×3
GOWN STRL REUS W/TWL XL LVL3 (GOWN DISPOSABLE) ×1
GRASPER SUT TROCAR 14GX15 (MISCELLANEOUS) ×2 IMPLANT
HEMOSTAT ARISTA ABSORB 3G PWDR (HEMOSTASIS) ×1 IMPLANT
IRRIGATION STRYKERFLOW (MISCELLANEOUS) IMPLANT
IRRIGATOR STRYKERFLOW (MISCELLANEOUS) ×2
IV LACTATED RINGERS 1000ML (IV SOLUTION) ×4 IMPLANT
KIT PINK PAD W/HEAD ARE REST (MISCELLANEOUS) ×2
KIT PINK PAD W/HEAD ARM REST (MISCELLANEOUS) ×1 IMPLANT
KIT TURNOVER CYSTO (KITS) ×2 IMPLANT
LABEL OR SOLS (LABEL) ×2 IMPLANT
LIGASURE VESSEL 5MM BLUNT TIP (ELECTROSURGICAL) ×1 IMPLANT
MANIPULATOR VCARE LG CRV RETR (MISCELLANEOUS) ×1 IMPLANT
MANIPULATOR VCARE SML CRV RETR (MISCELLANEOUS) IMPLANT
MANIPULATOR VCARE STD CRV RETR (MISCELLANEOUS) IMPLANT
NEEDLE HYPO 22GX1.5 SAFETY (NEEDLE) ×2 IMPLANT
OCCLUDER COLPOPNEUMO (BALLOONS) ×2 IMPLANT
PACK LAP CHOLECYSTECTOMY (MISCELLANEOUS) ×2 IMPLANT
PAD OB MATERNITY 4.3X12.25 (PERSONAL CARE ITEMS) ×2 IMPLANT
PAD PREP 24X41 OB/GYN DISP (PERSONAL CARE ITEMS) ×2 IMPLANT
SCISSORS METZENBAUM CVD 33 (INSTRUMENTS) ×2 IMPLANT
SET CYSTO W/LG BORE CLAMP LF (SET/KITS/TRAYS/PACK) ×2 IMPLANT
SET TRI-LUMEN FLTR TB AIRSEAL (TUBING) ×1 IMPLANT
SLEEVE ENDOPATH XCEL 5M (ENDOMECHANICALS) ×3 IMPLANT
SOL PREP PVP 2OZ (MISCELLANEOUS) ×2
SOLUTION PREP PVP 2OZ (MISCELLANEOUS) ×1 IMPLANT
SURGILUBE 2OZ TUBE FLIPTOP (MISCELLANEOUS) ×2 IMPLANT
SUT ENDO VLOC 180-0-8IN (SUTURE) ×3 IMPLANT
SUT MNCRL 4-0 (SUTURE) ×1
SUT MNCRL 4-0 27XMFL (SUTURE) ×1
SUT VIC AB 0 CT1 36 (SUTURE) ×2 IMPLANT
SUTURE MNCRL 4-0 27XMF (SUTURE) ×1 IMPLANT
SYR 10ML LL (SYRINGE) ×4 IMPLANT
SYR 50ML LL SCALE MARK (SYRINGE) ×2 IMPLANT
TROCAR ENDO BLADELESS 11MM (ENDOMECHANICALS) ×2 IMPLANT
TROCAR PORT AIRSEAL 8X100 (TROCAR) ×1 IMPLANT
TROCAR XCEL NON-BLD 5MMX100MML (ENDOMECHANICALS) ×2 IMPLANT
TUBING EVAC SMOKE HEATED PNEUM (TUBING) ×2 IMPLANT

## 2019-06-29 NOTE — Transfer of Care (Signed)
Immediate Anesthesia Transfer of Care Note  Patient: Mary Roberts  Procedure(s) Performed: TOTAL LAPAROSCOPIC HYSTERECTOMY WITH SALPINGECTOMY (N/A ) CYSTOSCOPY (N/A )  Patient Location: PACU  Anesthesia Type:General  Level of Consciousness: awake, alert  and oriented  Airway & Oxygen Therapy: Patient Spontanous Breathing and Patient connected to face mask oxygen  Post-op Assessment: Report given to RN and Post -op Vital signs reviewed and stable  Post vital signs: Reviewed and stable  Last Vitals:  Vitals Value Taken Time  BP 128/69 06/29/19 1326  Temp    Pulse 78 06/29/19 1330  Resp 17 06/29/19 1330  SpO2 100 % 06/29/19 1330  Vitals shown include unvalidated device data.  Last Pain:  Vitals:   06/29/19 0812  PainSc: 0-No pain         Complications: No apparent anesthesia complications

## 2019-06-29 NOTE — Anesthesia Procedure Notes (Signed)
Procedure Name: Intubation Performed by: Kelton Pillar, CRNA Pre-anesthesia Checklist: Patient identified, Emergency Drugs available, Suction available and Patient being monitored Patient Re-evaluated:Patient Re-evaluated prior to induction Oxygen Delivery Method: Circle system utilized Preoxygenation: Pre-oxygenation with 100% oxygen Induction Type: IV induction Ventilation: Mask ventilation without difficulty Laryngoscope Size: McGraph and 3 Grade View: Grade I Tube type: Oral Tube size: 6.5 mm Number of attempts: 1 Airway Equipment and Method: Stylet Placement Confirmation: ETT inserted through vocal cords under direct vision,  CO2 detector,  breath sounds checked- equal and bilateral and positive ETCO2 Secured at: 21 cm Tube secured with: Tape Dental Injury: Teeth and Oropharynx as per pre-operative assessment

## 2019-06-29 NOTE — Discharge Instructions (Signed)

## 2019-06-29 NOTE — Anesthesia Preprocedure Evaluation (Signed)
Anesthesia Evaluation  Patient identified by MRN, date of birth, ID band Patient awake    Reviewed: Allergy & Precautions, NPO status , Patient's Chart, lab work & pertinent test results  History of Anesthesia Complications Negative for: history of anesthetic complications  Airway Mallampati: II  TM Distance: >3 FB Neck ROM: Full    Dental no notable dental hx.    Pulmonary neg sleep apnea, neg COPD, former smoker,    breath sounds clear to auscultation- rhonchi (-) wheezing      Cardiovascular hypertension, Pt. on medications (-) CAD, (-) Past MI, (-) Cardiac Stents and (-) CABG DVT: hx of PE.   Rhythm:Regular Rate:Normal - Systolic murmurs and - Diastolic murmurs    Neuro/Psych  Headaches, Seizures - (hx of seizures, has not taken antiepileptic for many years with no seizures),  Anxiety    GI/Hepatic negative GI ROS, Neg liver ROS,   Endo/Other  negative endocrine ROSneg diabetes  Renal/GU negative Renal ROS     Musculoskeletal negative musculoskeletal ROS (+)   Abdominal (+) + obese,   Peds  Hematology  (+) anemia ,   Anesthesia Other Findings Past Medical History: No date: Anemia No date: Anxiety     Comment:  panic attacks No date: Carpal tunnel syndrome, bilateral No date: Carpal tunnel syndrome, bilateral No date: Chlamydia No date: Epilepsy seizure, nonconvulsive, generalized ([place]) No date: Headache(784.0)     Comment:  migraines No date: History of C-section No date: Hypertension No date: Hypertensive heart disease No date: Infection     Comment:  UTI No date: LVH (left ventricular hypertrophy)     Comment:  a. 12/2018 Echo: EF 60-65%, sev inc LV wall thickness.               Diast dysfxn. Triv AI. No date: Menorrhagia No date: Migraines No date: Pregnancy induced hypertension     Comment:  with first No date: Pulmonary embolism ([place])     Comment:  a. 12/2018 CTA Chest (pleuritic c/p): probable  small,               nonocclusive PE w/in subsegmental RUL branch vessel.               Artifact noted as well.  Eliquis initiatied. No date: Seizures ([facility])     Comment:  No meds, off since in mid to late 20's No date: UTI (urinary tract infection)   Reproductive/Obstetrics                             Anesthesia Physical Anesthesia Plan  ASA: III  Anesthesia Plan: General   Post-op Pain Management:    Induction: Intravenous  PONV Risk Score and Plan: 2 and Ondansetron, Dexamethasone and Midazolam  Airway Management Planned: Oral ETT  Additional Equipment:   Intra-op Plan:   Post-operative Plan: Extubation in OR  Informed Consent: I have reviewed the patients History and Physical, chart, labs and discussed the procedure including the risks, benefits and alternatives for the proposed anesthesia with the patient or authorized representative who has indicated his/her understanding and acceptance.     Dental advisory given  Plan Discussed with: CRNA and Anesthesiologist  Anesthesia Plan Comments:         Anesthesia Quick Evaluation

## 2019-06-29 NOTE — Interval H&P Note (Signed)
History and Physical Interval Note:  06/29/2019 10:23 AM  Mary Roberts  has presented today for surgery, with the diagnosis of Menorrhagia with regular cycle N92.0 Anemia due to chronic blood loss D50.0.  The various methods of treatment have been discussed with the patient and family. After consideration of risks, benefits and other options for treatment, the patient has consented to  Procedure(s): TOTAL LAPAROSCOPIC HYSTERECTOMY WITH SALPINGECTOMY (N/A) CYSTOSCOPY (N/A) as a surgical intervention.  The patient's history has been reviewed, patient examined, no change in status, stable for surgery.  I have reviewed the patient's chart and labs.  Questions were answered to the patient's satisfaction.    Prentice Docker, MD, [facility], [facility] 06/29/2019 10:25 AM

## 2019-06-29 NOTE — Op Note (Signed)
Operative Note    Pre-Operative Diagnosis:  1) Menorrhagia with regular cycle 2) chronic blood loss anemia due to menstrual blood loss 3) History of pulmonary embolism  Post-Operative Diagnosis:  1) Menorrhagia with regular cycle 2) chronic blood loss anemia due to menstrual blood loss 3) History of pulmonary embolism  Procedures:  1. Total Laparoscopic Hysterectomy, bilateral salpingectomy 2. Cystoscopy  Primary Surgeon: Prentice Docker, MD   Assistant Surgeon: Malachy Mood, MD - No other capable assistant available, in surgery requiring high level assistant.  EBL: 50 mL   IVF: 1,000 mL crystalloid  Urine output: 300 mL clear urine at end of case  Specimens: uterus with cervix, bilateral fallopian tubes  Drains: none  Complications: None   Disposition: PACU   Condition: Stable   Findings:  1) normal appearing uterus and cervix 2) bilateral fallopian tubes with changes consistent with tubal ligation. 3) right fallopian tube with Filshie clip in place 4) left fallopian tube with Filshie clip in posterior cul-de-sac 5) adhesions of bladder to lower uterine segment 6) adhesion of left fallopian tube to left pelvic sidewall 7) On cystoscopy no apparent bladder wall defects, efflux of urine from the bilateral ureteral orifices  Procedure Summary:  The patient was taken to the operating room where general anesthesia was administered and found to be adequate. She was placed in the dorsal supine lithotomy position in [place] stirrups and prepped and draped in usual sterile fashion. After a timeout was called, an indwelling catheter was placed in her bladder. A sterile speculum was placed in the vagina and a single-tooth tenaculum was used to grasp the anterior lip of the cervix. A V-Care uterine manipulator was affixed to the uterus in accordance to the manufacturers recommendations. The speculum and tenaculum were removed from the vagina.  Attention was turned to the  abdomen where, after injection of local anesthetic, a 5 mm Palmar's point incision was made with the scalpel. Entry into the abdomen was obtained via Optiview trocar technique (a blunt entry technique with camera visualization through the obturator upon entry). Verification of entry into the abdomen was obtained using opening pressures. The abdomen was insufflated with CO2. The camera was introduced through the trocar with verification of atraumatic entry. A right lower quadrant 8 mm AirSeal port was created via direct intra-abdominal camera visualization without difficulty. A 5 mm supraumbilical port was placed in a similar fashion without difficulty.  After inspection of the abdomen and pelvis with the above-noted findings, the bilateral ureters were identified and found to be well away from the operative area of interest. The right fallopian tube was grasped at the fimbriated end and was transected using the LigaSure along the mesosalpinx in a lateral to medial fashion. The LigaSure then was used to transect the right round ligament and the utero-ovarian ligament was transected. Tissue was divided along the right broad ligament to the level of the anterior cervical os. Adhesions from the anterior uterus were taken down with great care and the lower uterine segment was identified. Scar tissue and bladder tissue were dissected off the lower uterine segment and cervix without difficulty. The right uterine artery was skeletonized and identified and after ligation was transected with the LigaSure device. The same procedure was carried out on the left side after removal of adhesions of the left fallopian tube from the left pelvic sidewall. The colpotomy was performed using monopolar electrocautery in a circumferential fashion following the KOH ring.  The uterus and fallopian tubes and cervix were removed through the  vagina.  The vaginal occluder balloon was insufflated to prevent loss of pneumoperitoneum.   Closure  of the vaginal cuff was undertaken using the V-lock stitch in a running fashion. A gloved hand was placed in the vagina to assess adequate closure of the vaginal cuff. This was found to be satisfactory. All vascular pedicles were inspected and found to be hemostatic. Irrigation was undertaken and hemostasis was again verified.   Cystoscopy was undertaken at this point. The Foley catheter was removed and the 70 cystoscope was gently introduced through the urethra. The bladder survey was undertaken with efflux of urine from both orifices noted. There were no defects noted in the bladder wall. The cystoscope was removed and the Foley catheter was replaced.  Attention was returned to the pelvis and Arista 3 grams was placed along the vascular pedicles to ensure ongoing hemostasis (as this patient will restart full anticoagulation in two days).  The intra-abdominal pressure was lowered to 5 mmHg to verify hemostasis with lower pressure.  Hemostasis was again assured.  The right lower quadrant trocar was removed and the fascia was reapproximated using #0 Vicryl with a single stitch. The abdomen was then desufflated of CO2 after removal of all instruments. Several deep breaths were given by anesthesia to the patient to help with removal of CO2 from the abdomen. The right lower quadrant skin incision was closed using 4-0 Monocryl in a subcuticular fashion. The remaining skin incisions were closed using surgical skin glue (after a single subcutaneous stitch was thrown to reduce tension on the skin closure) and a layer of surgical skin glue was placed over the right lower quadrant skin incision, as well. The catheter was then removed from the bladder. The vagina was inspected and found to be free of instrumentation and sponges.  The patient tolerated the procedure well.  Sponge, lap, needle, and instrument counts were correct x 2.  VTE prophylaxis: lovenox 40 mg  given prior to surgery. She also wore SCDs throughout  the surgery. Antibiotic prophylaxis: gentamycin and clindamycin IV prior to skin incision. She was awakened in the operating room and was taken to the PACU in stable condition. The assistant surgeon was an MD due to lack of availability of another Counselling psychologist.  The assistant surgery gained entry into the abdomen at [facility] point after performing skin incision. The assistant also provided camera visualization throughout the case. The assistant performed transection of the left-sided pedicles and assisted with bladder resection.  Further, the assistant provided uterine manipulation throughout the case.   Prentice Docker, MD 06/29/2019 1:20 PM

## 2019-06-30 ENCOUNTER — Telehealth: Payer: Self-pay | Admitting: Obstetrics and Gynecology

## 2019-06-30 NOTE — Telephone Encounter (Signed)
Called patient POD#1 s/p TLH/BS/Cysto.   She states her pain is reasonably well controlled on her home medications.  She took 1 percocet last night and one this morning. She has also tried ibuprofen.   She denies vaginal bleeding.  She has tolerated PO without nausea.  She is voiding without difficulty. She has had no symptoms related to her blood pressure.   I reviewed wound care instructions and strategies to mitigate pain.   She was encouraged to call if any concerning symptoms develop, especially bleeding issues. We reviewed her anticoagulation plan. She is to take enoxaparin 40 mg [place] today. I will call her tomorrow and if bleeding seems not to be an issue, we will restart full-dose anticoagulation with Eliquis.  All current questions answered.  Prentice Docker, MD, [facility], [facility] 06/30/2019 10:36 AM

## 2019-06-30 NOTE — Anesthesia Postprocedure Evaluation (Signed)
Anesthesia Post Note  Patient: MAXINE STELLWAGEN  Procedure(s) Performed: TOTAL LAPAROSCOPIC HYSTERECTOMY WITH SALPINGECTOMY (N/A ) CYSTOSCOPY (N/A )  Patient location during evaluation: PACU Anesthesia Type: General Level of consciousness: awake and alert Pain management: pain level controlled Vital Signs Assessment: post-procedure vital signs reviewed and stable Respiratory status: spontaneous breathing, nonlabored ventilation, respiratory function stable and patient connected to nasal cannula oxygen Cardiovascular status: blood pressure returned to baseline and stable Postop Assessment: no apparent nausea or vomiting Anesthetic complications: no     Last Vitals:  Vitals:   06/29/19 1614 06/29/19 1724  BP: 134/79 125/78  Pulse: 78 85  Resp: 14 16  Temp: 36.5 C   SpO2: 98% 100%    Last Pain:  Vitals:   06/29/19 1724  TempSrc:   PainSc: 0-No pain                 Martha Clan

## 2019-07-01 LAB — SURGICAL PATHOLOGY

## 2019-07-02 ENCOUNTER — Telehealth: Payer: Self-pay | Admitting: Obstetrics and Gynecology

## 2019-07-02 NOTE — Telephone Encounter (Signed)
She notes no regular bleeding.  She doese not pink-ish fluid in the toilet when she goes to the bathroom, though she notes no frank bleeding.   She has restarted full-dose Eliquis yesterday.  No issues so far. She denies feeling more weak or lightheaded.  No other concerns so far.     She does feel gassy. No bowel movement so far.  Not taking percocet frequently. She is taking one of these every other day.  She has +flatus. She is taking Colace. Discussed taking Miralax if no BM today or tomorrow. Discussed taking Gas-x to help with the gas. She has bought some already. Discussed drinking coffee and tea to help with having a BM  Reviewed negative pathology with her. She does not have an appointment scheduled for next week with me yet. I encouraged her to call the office to make this appointment.  I sent her a MyChart message so that she can message me directly at any time.  All other questions answered.  Prentice Docker, MD, [facility], [facility] 07/02/2019 10:36 AM  i

## 2019-07-09 ENCOUNTER — Encounter: Payer: Self-pay | Admitting: Obstetrics and Gynecology

## 2019-07-09 ENCOUNTER — Other Ambulatory Visit: Payer: Self-pay

## 2019-07-09 ENCOUNTER — Ambulatory Visit (INDEPENDENT_AMBULATORY_CARE_PROVIDER_SITE_OTHER): Payer: BC Managed Care – PPO | Admitting: Obstetrics and Gynecology

## 2019-07-09 VITALS — BP 120/76 | Ht 62.0 in | Wt 177.0 lb

## 2019-07-09 DIAGNOSIS — Z09 Encounter for follow-up examination after completed treatment for conditions other than malignant neoplasm: Secondary | ICD-10-CM

## 2019-07-09 NOTE — Progress Notes (Signed)
   Postoperative Follow-up Patient presents post op from Total laparoscopic hysterectomy, bilateral salpingectomy, cystoscoyp 1 week ago for heavy vaginal bleeding.  Subjective: Patient reports marked improvement in her preop symptoms. Eating a regular diet without difficulty. Pain is manageable without pain medication.  Activity: slowly increasing.  Objective: Vitals:   07/09/19 1435  BP: 120/76   Vital Signs: BP 120/76   Ht 5\' 2"  (1.575 m)   Wt 177 lb (80.3 kg)   BMI 32.37 kg/m  Constitutional: Well nourished, well developed female in no acute distress.  HEENT: normal Skin: Warm and dry.  Extremity: no edema  Abdomen: Soft, non-tender, normal bowel sounds; no bruits, organomegaly or masses. clean, dry and intact    Assessment: 39 y.o. s/p TLH/BS/Cysto progressing well  Plan: Patient has done well after surgery with no apparent complications.  I have discussed the post-operative course to date, and the expected progress moving forward.  The patient understands what complications to be concerned about.  I will see the patient in routine follow up, or sooner if needed.    Activity plan: no intercourse for 8 weeks from surgery. No heavy lifting greater than 25 pounds for first 6 weeks. Otherwise, increase activity slowly.   Prentice Docker, MD 07/09/2019, 2:50 PM

## 2019-08-04 ENCOUNTER — Other Ambulatory Visit: Payer: Self-pay

## 2019-08-04 ENCOUNTER — Encounter: Payer: Self-pay | Admitting: Obstetrics and Gynecology

## 2019-08-04 ENCOUNTER — Ambulatory Visit (INDEPENDENT_AMBULATORY_CARE_PROVIDER_SITE_OTHER): Payer: BC Managed Care – PPO | Admitting: Obstetrics and Gynecology

## 2019-08-04 VITALS — BP 128/74 | Ht 62.0 in | Wt 178.0 lb

## 2019-08-04 DIAGNOSIS — D62 Acute posthemorrhagic anemia: Secondary | ICD-10-CM

## 2019-08-04 DIAGNOSIS — N920 Excessive and frequent menstruation with regular cycle: Secondary | ICD-10-CM

## 2019-08-04 DIAGNOSIS — Z48816 Encounter for surgical aftercare following surgery on the genitourinary system: Secondary | ICD-10-CM

## 2019-08-04 DIAGNOSIS — Z90710 Acquired absence of both cervix and uterus: Secondary | ICD-10-CM

## 2019-08-04 NOTE — Progress Notes (Signed)
   Postoperative Follow-up Patient presents post op from total laparoscopic hysterectomy, bilateral salpingectomy, cystoscopy 5 weeks ago for abnormal uterine bleeding (menorrhagia with regula cycle, chronic blood loss anemia due to menstrual blood loss).  Subjective: Patient reports marked improvement in her preop symptoms. Eating a regular diet without difficulty. The patient is not having any pain.  Activity: normal activities of daily living.  Objective: Vitals:   08/04/19 1518  BP: 128/74   Vital Signs: BP 128/74   Ht 5\' 2"  (1.575 m)   Wt 178 lb (80.7 kg)   BMI 32.56 kg/m  Constitutional: Well nourished, well developed female in no acute distress.  HEENT: normal Skin: Warm and dry.  Extremity: no edema  Abdomen: Soft, non-tender, normal bowel sounds; no bruits, organomegaly or masses. clean, dry and intact  Pelvic exam: (female chaperone present) is not limited by body habitus EGBUS: within normal limits Vagina: within normal limits and with no blood in the vault  The vaginal cuff is intact with no defects. There is no erythema, induration, warmth, or tenderness in this area.     Assessment: 39 y.o. s/p TLH/BS/Cysto progressing well  Plan: Patient has done well after surgery with no apparent complications.  I have discussed the post-operative course to date, and the expected progress moving forward.  The patient understands what complications to be concerned about.  I will see the patient in routine follow up, or sooner if needed.    Activity plan: No restriction.  May return to work on 2/12.  Note provided. Reiterated that no intercourse or nothing in her vagina for at least 8 weeks post-op.  Prentice Docker, MD 08/04/2019, 3:50 PM

## 2019-10-21 ENCOUNTER — Other Ambulatory Visit: Payer: Self-pay

## 2019-10-21 MED ORDER — AMLODIPINE BESYLATE 10 MG PO TABS: 10.0000 mg | ORAL_TABLET | Freq: Every day | ORAL | 0 refills | Status: DC

## 2019-11-04 ENCOUNTER — Ambulatory Visit: Payer: BC Managed Care – PPO | Admitting: Internal Medicine

## 2019-11-19 ENCOUNTER — Ambulatory Visit (INDEPENDENT_AMBULATORY_CARE_PROVIDER_SITE_OTHER): Payer: BC Managed Care – PPO | Admitting: Internal Medicine

## 2019-11-19 ENCOUNTER — Other Ambulatory Visit: Payer: Self-pay

## 2019-11-19 ENCOUNTER — Encounter: Payer: Self-pay | Admitting: Internal Medicine

## 2019-11-19 VITALS — BP 122/76 | HR 79 | Ht 62.5 in | Wt 183.0 lb

## 2019-11-19 DIAGNOSIS — Z86711 Personal history of pulmonary embolism: Secondary | ICD-10-CM

## 2019-11-19 DIAGNOSIS — I10 Essential (primary) hypertension: Secondary | ICD-10-CM

## 2019-11-19 NOTE — Progress Notes (Signed)
Follow-up Outpatient Visit Date: 11/19/2019  Primary Care Provider: Jodelle Green, FNP No address on file  Chief Complaint: Follow-up hypertension  HPI:  Mary Roberts is a 39 y.o. female with history of pulmonary embolism, hypertension, preeclampsia, and seizure disorder, who presents for follow-up of hypertension.  She was last seen in our office in 04/2019 by Ignacia Bayley, NP, for preoperative cardiovascular risk assessment in anticipation of hysterectomy for management of dysfunctional uterine bleeding and severe anemia.  At that time, she was feeling well.  She was referred to hematology for further evaluation of unprovoked PE and to discuss duration of anticoagulation, though she never proceeded with this appointment.  Today, Mary Roberts reports that she feels relatively well.  She underwent successful hysterectomy without complications.  She denies chest pain, shortness of breath, palpitations, lightheadedness, and edema.  She notes that it is somewhat difficult for her to take her blood pressure medications regularly.  She often misses the morning doses, as she forgets to take them before going to work.  She initially had quite a bit of nausea and generalized malaise with her current regimen, though that has since resolved she is interested in consolidating her regimen as much as possible, as well as discontinuing anticoagulation.  --------------------------------------------------------------------------------------------------  Cardiovascular History & Procedures: Cardiovascular Problems:  Pulmonary embolism  Hypertension  Risk Factors:  Hypertension and obesity  Cath/PCI:  None  CV Surgery:  None  EP Procedures and Devices:  None  Non-Invasive Evaluation(s):  TTE (01/08/2019): Normal LV size with moderate to severe LVH.  LVEF 60-65% with grade 1 diastolic dysfunction and elevated filling pressure.  Normal RV size and function.  Mild aortic valve calcification  with trivial AI.  LE venous Duplex (01/08/2019): No DVT in either lower extremity.  Recent CV Pertinent Labs: Lab Results  Component Value Date   INR 1.2 01/07/2019   K 3.1 (L) 06/29/2019   MG 1.7 03/03/2019   BUN 12 06/29/2019   CREATININE 1.00 06/29/2019   CREATININE 0.51 10/21/2013    Past medical and surgical history were reviewed and updated in EPIC.  Current Meds  Medication Sig  . amLODipine (NORVASC) 10 MG tablet Take 1 tablet (10 mg total) by mouth daily.  Marland Kitchen apixaban (ELIQUIS) 5 MG TABS tablet Take 1 tablet (5 mg total) by mouth 2 (two) times daily. Restart on 07/01/2019  . aspirin-acetaminophen-caffeine (EXCEDRIN MIGRAINE) 250-250-65 MG tablet Take 2 tablets by mouth every 6 (six) hours as needed for headache.   . hydrALAZINE (APRESOLINE) 25 MG tablet Take 1 tablet (25 mg total) by mouth 2 (two) times daily. Take 1 tablet (25 mg) by mouth twice daily  . ibuprofen (ADVIL) 600 MG tablet Take 1 tablet (600 mg total) by mouth every 8 (eight) hours. Take every 8 hours for first two days postop, then as needed every 8 hours.  Marland Kitchen labetalol (NORMODYNE) 200 MG tablet Take 1 tablet (200 mg total) by mouth 2 (two) times daily.  . Simethicone (GAS-X PO) Take 1 tablet by mouth daily as needed (trapped gas in chest).   . [DISCONTINUED] chlorthalidone (HYGROTON) 25 MG tablet Take 1 tablet (25 mg total) by mouth daily.    Allergies: Amoxicillin  Social History   Tobacco Use  . Smoking status: Former Research scientist (life sciences)  . Smokeless tobacco: Never Used  Substance Use Topics  . Alcohol use: Yes    Comment: socially   . Drug use: No    Family History  Problem Relation Age of Onset  . Seizures  Mother   . Hypertension Mother   . Diabetes Mother   . Heart disease Mother   . Stroke Mother   . Arthritis Mother   . Seizures Brother   . Hearing loss Neg Hx     Review of Systems: A 12-system review of systems was performed and was negative except as noted in the HPI.   --------------------------------------------------------------------------------------------------  Physical Exam: BP 122/76 (BP Location: Left Arm, Patient Position: Sitting, Cuff Size: Normal)   Pulse 79   Ht 5' 2.5" (1.588 m)   Wt 183 lb (83 kg)   BMI 32.94 kg/m   General: NAD. HEENT: No conjunctival pallor or scleral icterus. Facemask in place. Neck: No JVD or HJR. Lungs: Normal work of breathing. Clear to auscultation bilaterally without wheezes or crackles. Heart: Regular rate and rhythm without murmurs, rubs, or gallops. Abd: Bowel sounds present. Soft, NT/ND. Ext: No lower extremity edema..  EKG: Normal sinus rhythm with borderline left atrial enlargement.  Otherwise, no significant abnormality.  Lab Results  Component Value Date   WBC 5.9 06/24/2019   HGB 11.2 (L) 06/29/2019   HCT 33.0 (L) 06/29/2019   MCV 86.0 06/24/2019   PLT 281 06/24/2019    Lab Results  Component Value Date   NA 140 06/29/2019   K 3.1 (L) 06/29/2019   CL 103 06/29/2019   CO2 27 06/24/2019   BUN 12 06/29/2019   CREATININE 1.00 06/29/2019   GLUCOSE 108 (H) 06/29/2019   ALT 15 06/24/2019    No results found for: CHOL, HDL, LDLCALC, LDLDIRECT, TRIG, CHOLHDL  --------------------------------------------------------------------------------------------------  ASSESSMENT AND PLAN: Hypertension: Blood pressure is well controlled today.  As Mary Roberts would like to minimize the number of medications that she is on, I think it would be reasonable to de-escalate her antihypertensive regimen.  She wishes to start by stopping chlorthalidone, which is reasonable.  I have also counseled her about the importance of sodium restriction.  We will have her return in a month to reassess her blood pressure and determine if additional medication changes are appropriate.  Of note, she did not tolerate HCTZ or lisinopril well in the past.  History of pulmonary embolism: Mary Roberts would like to come off  anticoagulation, though she has not had any further significant bleeding following hysterectomy.  I have encouraged her to follow through with hematology consultation, as I think it would be helpful to undergo a hypercoagulable work-up in the setting of unprovoked pulmonary embolism in the past.  If a hypercoagulable work-up is unrevealing, I think it would be reasonable to consider discontinuation of anticoagulation at the patient's discretion.  Follow-up: Return to clinic in 1 month.  Nelva Bush, MD 11/20/2019 2:42 PM

## 2019-11-19 NOTE — Patient Instructions (Addendum)
Medication Instructions:  - Your physician has recommended you make the following change in your medication:   1) STOP chlorthalidone  *If you need a refill on your cardiac medications before your next appointment, please call your pharmacy*   Lab Work: - none ordered  If you have labs (blood work) drawn today and your tests are completely normal, you will receive your results only by: Marland Kitchen MyChart Message (if you have MyChart) OR . A paper copy in the mail If you have any lab test that is abnormal or we need to change your treatment, we will call you to review the results.   Testing/Procedures: - You have been referred to : Hematology - for history of PE (discuss if ok to stop anticoagulation)  - you will be called to schedule an appointment  Follow-Up: At [facility], you and your health needs are our priority.  As part of our continuing mission to provide you with exceptional heart care, we have created designated Provider Care Teams.  These Care Teams include your primary Cardiologist (physician) and Advanced Practice Providers (APPs -  Physician Assistants and Nurse Practitioners) who all work together to provide you with the care you need, when you need it.  We recommend signing up for the patient portal called "MyChart".  Sign up information is provided on this After Visit Summary.  MyChart is used to connect with patients for Virtual Visits (Telemedicine).  Patients are able to view lab/test results, encounter notes, upcoming appointments, etc.  Non-urgent messages can be sent to your provider as well.   To learn more about what you can do with MyChart, go to [email or web address].    Your next appointment:   1 month(s)  The format for your next appointment:   In Person  Provider:    You may see Nelva Bush, MD or one of the following Advanced Practice Providers on your designated Care Team:    Murray Hodgkins, NP  Christell Faith, PA-C  Marrianne Mood,  PA-C    Other Instructions n/a

## 2019-11-20 ENCOUNTER — Encounter: Payer: Self-pay | Admitting: Internal Medicine

## 2019-11-20 DIAGNOSIS — Z86711 Personal history of pulmonary embolism: Secondary | ICD-10-CM | POA: Insufficient documentation

## 2019-12-02 ENCOUNTER — Other Ambulatory Visit: Payer: Self-pay

## 2019-12-02 MED ORDER — AMLODIPINE BESYLATE 10 MG PO TABS: 10.0000 mg | ORAL_TABLET | Freq: Every day | ORAL | 0 refills | Status: DC

## 2019-12-07 ENCOUNTER — Encounter: Payer: Self-pay | Admitting: Oncology

## 2019-12-07 ENCOUNTER — Other Ambulatory Visit: Payer: Self-pay

## 2019-12-07 ENCOUNTER — Inpatient Hospital Stay: Payer: BC Managed Care – PPO | Attending: Oncology | Admitting: Oncology

## 2019-12-07 ENCOUNTER — Inpatient Hospital Stay: Payer: BC Managed Care – PPO

## 2019-12-07 VITALS — BP 145/88 | HR 71 | Temp 97.6°F | Resp 16 | Wt 186.2 lb

## 2019-12-07 DIAGNOSIS — N920 Excessive and frequent menstruation with regular cycle: Secondary | ICD-10-CM | POA: Diagnosis not present

## 2019-12-07 DIAGNOSIS — I161 Hypertensive emergency: Secondary | ICD-10-CM | POA: Insufficient documentation

## 2019-12-07 DIAGNOSIS — Z9079 Acquired absence of other genital organ(s): Secondary | ICD-10-CM | POA: Diagnosis not present

## 2019-12-07 DIAGNOSIS — Z8261 Family history of arthritis: Secondary | ICD-10-CM | POA: Diagnosis not present

## 2019-12-07 DIAGNOSIS — Z87440 Personal history of urinary (tract) infections: Secondary | ICD-10-CM | POA: Insufficient documentation

## 2019-12-07 DIAGNOSIS — Z8249 Family history of ischemic heart disease and other diseases of the circulatory system: Secondary | ICD-10-CM | POA: Diagnosis not present

## 2019-12-07 DIAGNOSIS — I10 Essential (primary) hypertension: Secondary | ICD-10-CM | POA: Diagnosis not present

## 2019-12-07 DIAGNOSIS — Z833 Family history of diabetes mellitus: Secondary | ICD-10-CM | POA: Diagnosis not present

## 2019-12-07 DIAGNOSIS — Z79899 Other long term (current) drug therapy: Secondary | ICD-10-CM | POA: Insufficient documentation

## 2019-12-07 DIAGNOSIS — Z86711 Personal history of pulmonary embolism: Secondary | ICD-10-CM

## 2019-12-07 DIAGNOSIS — Z820 Family history of epilepsy and other diseases of the nervous system: Secondary | ICD-10-CM | POA: Insufficient documentation

## 2019-12-07 DIAGNOSIS — Z90710 Acquired absence of both cervix and uterus: Secondary | ICD-10-CM | POA: Diagnosis not present

## 2019-12-07 DIAGNOSIS — Z823 Family history of stroke: Secondary | ICD-10-CM | POA: Insufficient documentation

## 2019-12-07 DIAGNOSIS — Z7901 Long term (current) use of anticoagulants: Secondary | ICD-10-CM | POA: Diagnosis not present

## 2019-12-07 DIAGNOSIS — D500 Iron deficiency anemia secondary to blood loss (chronic): Secondary | ICD-10-CM | POA: Diagnosis not present

## 2019-12-07 DIAGNOSIS — Z880 Allergy status to penicillin: Secondary | ICD-10-CM | POA: Insufficient documentation

## 2019-12-07 NOTE — Progress Notes (Signed)
Patient is taking Eliquis for poss PE and she would like to d/c medication.  Referring provider would like hematology work up.

## 2019-12-07 NOTE — Progress Notes (Signed)
Hematology/Oncology Consult note [facility] Telephone:([phone] [phone]   Patient Care Team: Jodelle Green, FNP as PCP - General (Family Medicine) End, Harrell Gave, MD as PCP - Cardiology (Cardiology)  REFERRING PROVIDER: End, Harrell Gave, MD  CHIEF COMPLAINTS/REASON FOR VISIT:  Evaluation of pulmonary embolism  HISTORY OF PRESENTING ILLNESS:   Mary Roberts is a  39 y.o.  female with PMH listed below was seen in consultation at the request of  End, Harrell Gave, MD  for evaluation of pulmonary embolism She presented to ED on 12/28/2018 due to heart palpitation and uncontrolled hypertension.  Patient was found to be in hypertension emergency with blood pressure exceeding 200.  She also has dyspnea associated with palpitation 01/07/2019 CT angiogram chest PE protocol showed probable small nonocclusive embolus within subsegmental right upper lobe branch vessel, though evaluation was limited due to artifact arising from dense contrast within the SVC.  7/172020 US venous lower extremity, no DVT.  No provoking factors were found.  No hospitalization, trauma, OCP use, long distance car ride or flights. Patient was started on heparin drip which was switched to Eliquis at discharge. Patient was referred to hematology for further evaluation and management which she did not establish care and to now. He was recently seen by cardiologist Dr. Saunders Revel and expressed her wishes of come off anticoagulation.  She was referred by cardiology to hematology for further discussion. Patient has been on Eliquis for close to a year now.  She has had excessive uterus bleeding status post hysterectomy in January 2021.  Today she reports feeling well.  Denies any chest pain, shortness of breath, lower extremity swelling. Family history is positive for mother being on blood thinner at some point for blood clots.  She also has a history of seizures currently not on any medication.   She has been off seizure medication since late 20s. Review of Systems  Constitutional: Negative for appetite change, chills, fatigue and fever.  HENT:   Negative for hearing loss and voice change.   Eyes: Negative for eye problems.  Respiratory: Negative for chest tightness and cough.   Cardiovascular: Negative for chest pain.  Gastrointestinal: Negative for abdominal distention, abdominal pain and blood in stool.  Endocrine: Negative for hot flashes.  Genitourinary: Negative for difficulty urinating and frequency.   Musculoskeletal: Negative for arthralgias.  Skin: Negative for itching and rash.  Neurological: Negative for extremity weakness.  Hematological: Negative for adenopathy.  Psychiatric/Behavioral: Negative for confusion.    MEDICAL HISTORY:  Past Medical History:  Diagnosis Date  . Anemia   . Anxiety    panic attacks  . Carpal tunnel syndrome, bilateral   . Carpal tunnel syndrome, bilateral   . Chlamydia   . Epilepsy seizure, nonconvulsive, generalized ([place])   . Headache(784.0)    migraines  . History of C-section   . Hypertension   . Hypertensive heart disease   . Infection    UTI  . LVH (left ventricular hypertrophy)    a. 12/2018 Echo: EF 60-65%, sev inc LV wall thickness. Diast dysfxn. Triv AI.  Marland Kitchen Menorrhagia   . Migraines   . Pregnancy induced hypertension    with first  . Pulmonary embolism ([place])    a. 12/2018 CTA Chest (pleuritic c/p): probable small, nonocclusive PE w/in subsegmental RUL branch vessel. Artifact noted as well.  Eliquis initiatied.  . Seizures ([place])    No meds, off since in mid to late 20's  . UTI (urinary tract infection)  SURGICAL HISTORY: Past Surgical History:  Procedure Laterality Date  . CESAREAN SECTION    . CYSTOSCOPY N/A 06/29/2019   Procedure: CYSTOSCOPY;  Surgeon: Will Bonnet, MD;  Location: [facility];  Service: Gynecology;  Laterality: N/A;  . MOUTH SURGERY    . TOTAL LAPAROSCOPIC HYSTERECTOMY WITH SALPINGECTOMY  N/A 06/29/2019   Procedure: TOTAL LAPAROSCOPIC HYSTERECTOMY WITH SALPINGECTOMY;  Surgeon: Will Bonnet, MD;  Location: [facility];  Service: Gynecology;  Laterality: N/A;  . TUBAL LIGATION N/A 10/30/2013   Procedure: POST PARTUM TUBAL LIGATION;  Surgeon: [name], MD;  Location: [facility];  Service: Gynecology;  Laterality: N/A;    SOCIAL HISTORY: Social History   Socioeconomic History  . Marital status: Single    Spouse name: Not on file  . Number of children: Not on file  . Years of education: Not on file  . Highest education level: Not on file  Occupational History  . Not on file  Tobacco Use  . Smoking status: Never Smoker  . Smokeless tobacco: Never Used  . Tobacco comment: has smoked 1 pack in 1 week her life  Vaping Use  . Vaping Use: Never used  Substance and Sexual Activity  . Alcohol use: Yes    Comment: socially   . Drug use: No  . Sexual activity: Yes    Birth control/protection: Surgical, I.U.D.  Other Topics Concern  . Not on file  Social History Narrative  . Not on file   Social Determinants of Health   Financial Resource Strain:   . Difficulty of Paying Living Expenses:   Food Insecurity:   . Worried About Charity fundraiser in the Last Year:   . Arboriculturist in the Last Year:   Transportation Needs:   . Film/video editor (Medical):   Marland Kitchen Lack of Transportation (Non-Medical):   Physical Activity:   . Days of Exercise per Week:   . Minutes of Exercise per Session:   Stress:   . Feeling of Stress :   Social Connections:   . Frequency of Communication with Friends and Family:   . Frequency of Social Gatherings with Friends and Family:   . Attends Religious Services:   . Active Member of Clubs or Organizations:   . Attends Archivist Meetings:   Marland Kitchen Marital Status:   Intimate Partner Violence:   . Fear of Current or Ex-Partner:   . Emotionally Abused:   Marland Kitchen Physically Abused:   . Sexually Abused:     FAMILY HISTORY: Family  History  Problem Relation Age of Onset  . Seizures Mother   . Hypertension Mother   . Diabetes Mother   . Heart disease Mother   . Stroke Mother   . Arthritis Mother   . Seizures Brother   . Hearing loss Neg Hx     ALLERGIES:  is allergic to amoxicillin.  MEDICATIONS:  Current Outpatient Medications  Medication Sig Dispense Refill  . amLODipine (NORVASC) 10 MG tablet Take 1 tablet (10 mg total) by mouth daily. 30 tablet 0  . apixaban (ELIQUIS) 5 MG TABS tablet Take 1 tablet (5 mg total) by mouth 2 (two) times daily. Restart on 07/01/2019 60 tablet 6  . aspirin-acetaminophen-caffeine (EXCEDRIN MIGRAINE) 250-250-65 MG tablet Take 2 tablets by mouth every 6 (six) hours as needed for headache.     . hydrALAZINE (APRESOLINE) 25 MG tablet Take 1 tablet (25 mg total) by mouth 2 (two) times daily. Take 1 tablet (25 mg)  by mouth twice daily    . ibuprofen (ADVIL) 600 MG tablet Take 1 tablet (600 mg total) by mouth every 8 (eight) hours. Take every 8 hours for first two days postop, then as needed every 8 hours. 30 tablet 0  . labetalol (NORMODYNE) 200 MG tablet Take 1 tablet (200 mg total) by mouth 2 (two) times daily.    . Simethicone (GAS-X PO) Take 1 tablet by mouth daily as needed (trapped gas in chest).      No current facility-administered medications for this visit.     PHYSICAL EXAMINATION: ECOG PERFORMANCE STATUS: 0 - Asymptomatic Vitals:   12/07/19 1504  BP: (!) 145/88  Pulse: 71  Resp: 16  Temp: 97.6 F (36.4 C)   Filed Weights   12/07/19 1504  Weight: 186 lb 3.2 oz (84.5 kg)    Physical Exam Constitutional:      General: She is not in acute distress. HENT:     Head: Normocephalic and atraumatic.  Eyes:     General: No scleral icterus. Cardiovascular:     Rate and Rhythm: Normal rate and regular rhythm.     Heart sounds: Normal heart sounds.  Pulmonary:     Effort: Pulmonary effort is normal. No respiratory distress.     Breath sounds: No wheezing.  Abdominal:      General: Bowel sounds are normal. There is no distension.     Palpations: Abdomen is soft.  Musculoskeletal:        General: No deformity. Normal range of motion.     Cervical back: Normal range of motion and neck supple.  Skin:    General: Skin is warm and dry.     Findings: No erythema or rash.  Neurological:     Mental Status: She is alert and oriented to person, place, and time. Mental status is at baseline.     Cranial Nerves: No cranial nerve deficit.     Coordination: Coordination normal.  Psychiatric:        Mood and Affect: Mood normal.     LABORATORY DATA:  I have reviewed the data as listed Lab Results  Component Value Date   WBC 5.9 06/24/2019   HGB 11.2 (L) 06/29/2019   HCT 33.0 (L) 06/29/2019   MCV 86.0 06/24/2019   PLT 281 06/24/2019   Recent Labs    01/07/19 0020 01/07/19 0608 03/03/19 1434 03/03/19 1434 03/04/19 0609 06/24/19 0848 06/29/19 0800  NA 137   < > 138   < > 140 136 140  K 3.4*   < > 2.9*   < > 2.9* 2.9* 3.1*  CL 104   < > 101   < > 105 99 103  CO2 25   < > 27  --  28 27  --   GLUCOSE 98   < > 101*   < > 102* 86 108*  BUN 13   < > 19   < > 16 10 12   CREATININE 0.88   < > 1.21*   < > 1.04* 0.97 1.00  CALCIUM 9.0   < > 9.1  --  8.5* 9.0  --   GFRNONAA >60   < > 56*  --  >60 >60  --   GFRAA >60   < > >60  --  >60 >60  --   PROT 7.5  --  7.3  --   --  8.2*  --   ALBUMIN 3.9  --  3.9  --   --  4.4  --   AST 16  --  16  --   --  29  --   ALT 12  --  12  --   --  15  --   ALKPHOS 37*  --  33*  --   --  41  --   BILITOT 0.7  --  0.6  --   --  0.5  --    < > = values in this interval not displayed.   Iron/TIBC/Ferritin/ %Sat    Component Value Date/Time   IRON 9 (L) 07/14/2016 0739   TIBC 409 07/14/2016 0739   FERRITIN 4 (L) 07/14/2016 0739   IRONPCTSAT 2 (L) 07/14/2016 0739      RADIOGRAPHIC STUDIES: I have personally reviewed the radiological images as listed and agreed with the findings in the report. No results  found.    ASSESSMENT & PLAN:  1. History of pulmonary embolism    Images were independently reviewed by me and discussed with patient. Small pulmonary embolism in July 2020, unprovoked. Patient has been on therapeutic dose of Eliquis 5 mg twice daily for close to a year. Her symptoms has improved and she is doing clinically well and prefers to discontinue anticoagulation. We talked about the different management of provoked and unprovoked PE. In general, we usually recommend patient to be switched to low-dose Eliquis for anticoagulation prophylaxis given the potential increased risk of recurrent thrombosis risk. Given that she has family history of DVT, young age, I recommend to proceed with hypercoagulable work-up. I recommend patient to stop Eliquis 5 mg twice daily for 3 days prior to proceeding with hypercoagulable work-up. After that she may proceed with Eliquis 2.5 mg twice daily or if she prefers to be off anticoagulation, I think it is reasonable to discontinue anticoagulation at her discretion if hypercoagulable work-up is negative. She agrees with the plan.   History of iron deficiency anemia secondary to menorrhagia.  Status post hysterectomy.  Check CBC, CMP, iron, TIBC, ferritin. Orders Placed This Encounter  Procedures  . Comprehensive metabolic panel    Standing Status:   Future    Standing Expiration Date:   12/06/2020  . CBC with Differential/Platelet    Standing Status:   Future    Standing Expiration Date:   12/06/2020  . Ferritin    Standing Status:   Future    Standing Expiration Date:   12/06/2020  . Iron and TIBC    Standing Status:   Future    Standing Expiration Date:   12/06/2020  . Factor 5 leiden    Standing Status:   Future    Standing Expiration Date:   12/06/2020  . Prothrombin gene mutation    Standing Status:   Future    Standing Expiration Date:   12/06/2020  . Antithrombin III    Standing Status:   Future    Standing Expiration Date:   12/06/2020   . ANTIPHOSPHOLIPID SYNDROME PROF    Standing Status:   Future    Standing Expiration Date:   12/06/2020  . Protein S, Antigen, Free    Standing Status:   Future    Standing Expiration Date:   12/06/2020  . Protein C activity    Standing Status:   Future    Standing Expiration Date:   12/06/2020  . Protein C, total    Standing Status:   Future    Standing Expiration Date:   12/06/2020    All questions were answered. The patient  knows to call the clinic with any problems questions or concerns.  cc End, Harrell Gave, MD    Return of visit: To be determined. Thank you for this kind referral and the opportunity to participate in the care of this patient. A copy of today's note is routed to referring provider    Earlie Server, MD, PhD Hematology Oncology [facility] Pager- [phone] 12/07/2019

## 2019-12-20 ENCOUNTER — Ambulatory Visit: Payer: BC Managed Care – PPO | Admitting: Family

## 2019-12-22 ENCOUNTER — Inpatient Hospital Stay: Payer: BC Managed Care – PPO

## 2019-12-28 ENCOUNTER — Ambulatory Visit: Payer: BC Managed Care – PPO | Admitting: Family

## 2019-12-28 NOTE — Progress Notes (Deleted)
Office Visit    Patient Name: Mary Roberts Date of Encounter: 12/28/2019  Primary Care Provider:  Jodelle Green, FNP Primary Cardiologist:  Nelva Bush, MD Electrophysiologist:  None   Chief Complaint    Mary Roberts is a 39 y.o. female with a hx of pulmonary embolism, HTN, preeclampsia, seizure disorder presents today for ***   Past Medical History    Past Medical History:  Diagnosis Date  . Anemia   . Anxiety    panic attacks  . Carpal tunnel syndrome, bilateral   . Carpal tunnel syndrome, bilateral   . Chlamydia   . Epilepsy seizure, nonconvulsive, generalized ([place])   . Headache(784.0)    migraines  . History of C-section   . Hypertension   . Hypertensive heart disease   . Infection    UTI  . LVH (left ventricular hypertrophy)    a. 12/2018 Echo: EF 60-65%, sev inc LV wall thickness. Diast dysfxn. Triv AI.  Marland Kitchen Menorrhagia   . Migraines   . Pregnancy induced hypertension    with first  . Pulmonary embolism ([place])    a. 12/2018 CTA Chest (pleuritic c/p): probable small, nonocclusive PE w/in subsegmental RUL branch vessel. Artifact noted as well.  Eliquis initiatied.  . Seizures ([place])    No meds, off since in mid to late 20's  . UTI (urinary tract infection)    Past Surgical History:  Procedure Laterality Date  . CESAREAN SECTION    . CYSTOSCOPY N/A 06/29/2019   Procedure: CYSTOSCOPY;  Surgeon: Will Bonnet, MD;  Location: [facility];  Service: Gynecology;  Laterality: N/A;  . MOUTH SURGERY    . TOTAL LAPAROSCOPIC HYSTERECTOMY WITH SALPINGECTOMY N/A 06/29/2019   Procedure: TOTAL LAPAROSCOPIC HYSTERECTOMY WITH SALPINGECTOMY;  Surgeon: Will Bonnet, MD;  Location: [facility];  Service: Gynecology;  Laterality: N/A;  . TUBAL LIGATION N/A 10/30/2013   Procedure: POST PARTUM TUBAL LIGATION;  Surgeon: [name], MD;  Location: [place] [facility];  Service: Gynecology;  Laterality: N/A;    Allergies  Allergies  Allergen Reactions  . Amoxicillin Hives and  Other (See Comments)    Has patient had a PCN reaction causing immediate rash, facial/tongue/throat swelling, SOB or lightheadedness with hypotension: No Has patient had a PCN reaction causing severe rash involving mucus membranes or skin necrosis: No Has patient had a PCN reaction that required hospitalization: No Has patient had a PCN reaction occurring within the last 10 years: Yes If all of the above answers are "NO", then may proceed with Cephalosporin use.     History of Present Illness    Mary Roberts is a 39 y.o. female with a hx of pulmonary embolism, HTN, preeclampsia, seizure disorder last seen 11/19/19 by Dr. Saunders Revel.  Presented to ED 12/2018 for palpitations and uncontrolled hypertension. CT chest with probably small nonocclusive embolus with subsegmental RUL branch vessel. Lower extremity duplex was without DVT.   Sen in clinic 04/2019 for preoperative risk assessment for hysterectomy for management of dysfunctional uterine bleeding and severe anemia. Referred to hematology for further evaluation of unprovoked PE and to discuss duration of anticoagulation though did not complete that appointment.   Seen by Dr. Saunders Revel 11/19/19 feeling well, but noting missed doses of blood pressure medications. She was interested in consolidating her regimen as well as discontinuing anticoagulation. Her chlorthalidone was stopped. She has previously been intolerant of HCTZ and lisinopril. She was encouraged to proceed with hypercoagulable workup with hematology.   She was seen  by Dr. Tasia Catchings of hematology 12/07/19. Plans to proceed with hypercoagulable workup (stop Eliquis 3 days prior) then resume at Eliquis 2.5mg  twice daily.   ***  EKGs/Labs/Other Studies Reviewed:   The following studies were reviewed today: ***  EKG:  EKG is ordered today.  The ekg ordered today demonstrates ***  Recent Labs: 01/07/2019: TSH 1.693 03/03/2019: Magnesium 1.7 06/24/2019: ALT 15; Platelets 281 06/29/2019: BUN 12;  Creatinine, Ser 1.00; Hemoglobin 11.2; Potassium 3.1; Sodium 140  Recent Lipid Panel No results found for: CHOL, TRIG, HDL, CHOLHDL, VLDL, LDLCALC, LDLDIRECT  Home Medications   No outpatient medications have been marked as taking for the 12/28/19 encounter (Appointment) with Loel Dubonnet, NP.      Review of Systems    ***   ROS All other systems reviewed and are otherwise negative except as noted above.  Physical Exam    VS:  There were no vitals taken for this visit. , BMI There is no height or weight on file to calculate BMI. GEN: Well nourished, well developed, in no acute distress. HEENT: normal. Neck: Supple, no JVD, carotid bruits, or masses. Cardiac: ***RRR, no murmurs, rubs, or gallops. No clubbing, cyanosis, edema.  ***Radials/DP/PT 2+ and equal bilaterally.  Respiratory:  ***Respirations regular and unlabored, clear to auscultation bilaterally. GI: Soft, nontender, nondistended, BS + x 4. MS: No deformity or atrophy. Skin: Warm and dry, no rash. Neuro:  Strength and sensation are intact. Psych: Normal affect.  Assessment & Plan    1. HTN -  2. Hx of PE - Continue to follow with pulmonology for hypercoagulable workup.   Disposition: Follow up {follow up:15908} with Dr. Saunders Revel or APP   Loel Dubonnet, NP 12/28/2019, 12:41 PM

## 2020-01-03 ENCOUNTER — Ambulatory Visit: Payer: BC Managed Care – PPO | Admitting: Family

## 2020-01-03 NOTE — Progress Notes (Deleted)
Office Visit    Patient Name: Mary Roberts Date of Encounter: 01/03/2020  Primary Care Provider:  Jodelle Green, FNP Primary Cardiologist:  Nelva Bush, MD Electrophysiologist:  None   Chief Complaint    [name] is a 39 y.o. female with a hx of pulmonary embolism, HTN, preeclampsia, seizure disorder presents today for ***   Past Medical History    Past Medical History:  Diagnosis Date  . Anemia   . Anxiety    panic attacks  . Carpal tunnel syndrome, bilateral   . Carpal tunnel syndrome, bilateral   . Chlamydia   . Epilepsy seizure, nonconvulsive, generalized ([place])   . Headache(784.0)    migraines  . History of C-section   . Hypertension   . Hypertensive heart disease   . Infection    UTI  . LVH (left ventricular hypertrophy)    a. 12/2018 Echo: EF 60-65%, sev inc LV wall thickness. Diast dysfxn. Triv AI.  Marland Kitchen Menorrhagia   . Migraines   . Pregnancy induced hypertension    with first  . Pulmonary embolism ([place])    a. 12/2018 CTA Chest (pleuritic c/p): probable small, nonocclusive PE w/in subsegmental RUL branch vessel. Artifact noted as well.  Eliquis initiatied.  . Seizures ([place])    No meds, off since in mid to late 20's  . UTI (urinary tract infection)    Past Surgical History:  Procedure Laterality Date  . CESAREAN SECTION    . CYSTOSCOPY N/A 06/29/2019   Procedure: CYSTOSCOPY;  Surgeon: Will Bonnet, MD;  Location: [facility];  Service: Gynecology;  Laterality: N/A;  . MOUTH SURGERY    . TOTAL LAPAROSCOPIC HYSTERECTOMY WITH SALPINGECTOMY N/A 06/29/2019   Procedure: TOTAL LAPAROSCOPIC HYSTERECTOMY WITH SALPINGECTOMY;  Surgeon: Will Bonnet, MD;  Location: [facility];  Service: Gynecology;  Laterality: N/A;  . TUBAL LIGATION N/A 10/30/2013   Procedure: POST PARTUM TUBAL LIGATION;  Surgeon: [name], MD;  Location: [facility];  Service: Gynecology;  Laterality: N/A;    Allergies  Allergies  Allergen Reactions  . Amoxicillin Hives and  Other (See Comments)    Has patient had a PCN reaction causing immediate rash, facial/tongue/throat swelling, SOB or lightheadedness with hypotension: No Has patient had a PCN reaction causing severe rash involving mucus membranes or skin necrosis: No Has patient had a PCN reaction that required hospitalization: No Has patient had a PCN reaction occurring within the last 10 years: Yes If all of the above answers are "NO", then may proceed with Cephalosporin use.     History of Present Illness    NEVAH Roberts is a 39 y.o. female with a hx of pulmonary embolism, HTN, preeclampsia, seizure disorder last seen 11/19/19 by Dr. Saunders Revel.  Presented to ED 12/2018 for palpitations and uncontrolled hypertension. CT chest with probably small nonocclusive embolus with subsegmental RUL branch vessel. Lower extremity duplex was without DVT.   Sen in clinic 04/2019 for preoperative risk assessment for hysterectomy for management of dysfunctional uterine bleeding and severe anemia. Referred to hematology for further evaluation of unprovoked PE and to discuss duration of anticoagulation though did not complete that appointment.   Seen by Dr. Saunders Revel 11/19/19 feeling well, but noting missed doses of blood pressure medications. She was interested in consolidating her regimen as well as discontinuing anticoagulation. Her chlorthalidone was stopped. She has previously been intolerant of HCTZ and lisinopril. She was encouraged to proceed with hypercoagulable workup with hematology.   She was seen  by Dr. Tasia Catchings of hematology 12/07/19. Plans to proceed with hypercoagulable workup (stop Eliquis 3 days prior) then resume at Eliquis 2.5mg  twice daily.   ***  EKGs/Labs/Other Studies Reviewed:   The following studies were reviewed today: ***  EKG:  EKG is ordered today.  The ekg ordered today demonstrates ***  Recent Labs: 01/07/2019: TSH 1.693 03/03/2019: Magnesium 1.7 06/24/2019: ALT 15; Platelets 281 06/29/2019: BUN 12;  Creatinine, Ser 1.00; Hemoglobin 11.2; Potassium 3.1; Sodium 140  Recent Lipid Panel No results found for: CHOL, TRIG, HDL, CHOLHDL, VLDL, LDLCALC, LDLDIRECT  Home Medications   No outpatient medications have been marked as taking for the 01/03/20 encounter (Appointment) with Loel Dubonnet, NP.   Review of Systems    ***   ROS All other systems reviewed and are otherwise negative except as noted above.  Physical Exam    VS:  There were no vitals taken for this visit. , BMI There is no height or weight on file to calculate BMI. GEN: Well nourished, well developed, in no acute distress. HEENT: normal. Neck: Supple, no JVD, carotid bruits, or masses. Cardiac: ***RRR, no murmurs, rubs, or gallops. No clubbing, cyanosis, edema.  ***Radials/DP/PT 2+ and equal bilaterally.  Respiratory:  ***Respirations regular and unlabored, clear to auscultation bilaterally. GI: Soft, nontender, nondistended, BS + x 4. MS: No deformity or atrophy. Skin: Warm and dry, no rash. Neuro:  Strength and sensation are intact. Psych: Normal affect.  Assessment & Plan    1. HTN -  2. Hx of PE - Continue to follow with pulmonology for hypercoagulable workup.   Disposition: Follow up {follow up:15908} with Dr. Saunders Revel or APP   Loel Dubonnet, NP 01/03/2020, 11:05 AM

## 2020-01-05 ENCOUNTER — Encounter: Payer: Self-pay | Admitting: Family

## 2020-01-20 ENCOUNTER — Other Ambulatory Visit: Payer: Self-pay | Admitting: Internal Medicine

## 2020-01-20 DIAGNOSIS — I2699 Other pulmonary embolism without acute cor pulmonale: Secondary | ICD-10-CM

## 2020-01-20 DIAGNOSIS — Z90710 Acquired absence of both cervix and uterus: Secondary | ICD-10-CM

## 2020-01-21 NOTE — Telephone Encounter (Signed)
Please contact pt for future appointment pt overdue for 1 month f/u. Last seen 10/2019.

## 2020-01-21 NOTE — Telephone Encounter (Signed)
LVM for patient to call back and schedule f/u

## 2020-01-28 ENCOUNTER — Other Ambulatory Visit: Payer: Self-pay | Admitting: Internal Medicine

## 2020-01-28 DIAGNOSIS — I2699 Other pulmonary embolism without acute cor pulmonale: Secondary | ICD-10-CM

## 2020-01-28 DIAGNOSIS — Z90710 Acquired absence of both cervix and uterus: Secondary | ICD-10-CM

## 2020-01-28 NOTE — Telephone Encounter (Signed)
Please review for eliquis refill, Thanks !

## 2020-01-28 NOTE — Telephone Encounter (Signed)
°*  STAT* If patient is at the pharmacy, call can be transferred to refill team.   1. Which medications need to be refilled? (please list name of each medication and dose if known) eliquis 5 bid and amlodipine 10 mg  2. Which pharmacy/location (including street and city if local pharmacy) is medication to be sent to? [facility]  3. Do they need a 30 day or 90 day supply? 30  Pt aware that she needs appointment but is out of medication. An appointment has been scheduled for 8/23

## 2020-01-31 ENCOUNTER — Other Ambulatory Visit: Payer: Self-pay | Admitting: Family

## 2020-01-31 DIAGNOSIS — Z90710 Acquired absence of both cervix and uterus: Secondary | ICD-10-CM

## 2020-01-31 DIAGNOSIS — I2699 Other pulmonary embolism without acute cor pulmonale: Secondary | ICD-10-CM

## 2020-01-31 NOTE — Telephone Encounter (Signed)
Received refill request for Eliquis 5 mg BID. Routing to hematology, as it looks like pt will either be on lower dose or d/c'ing altogether.  Please advise.  Thank you!

## 2020-01-31 NOTE — Telephone Encounter (Signed)
Spoke w/ pt.  Advised her that I received refill request for Eliquis 5 mg, and relayed message from Dr. Miachel Roux. She states that she has not made an appt yet for testing and has run out of her Eliquis. She states that she requested the med change herself, so she is in no rush to get this done right now.  Advised her that I will send in refill and suggested that she make appt at her convenience.  She verbalizes understanding and is appreciative of the call.

## 2020-01-31 NOTE — Telephone Encounter (Signed)
Mary Roberts,  I saw patient in June and recommend her to do blood work after stopping Elqiuis for 3 days. My recommendation of Elqiuis will be based on her blood work. She has not done these testing and therefore I can not give much recommendation. Thank you  Talbert Cage

## 2020-02-09 NOTE — Telephone Encounter (Signed)
Scheduled

## 2020-02-14 ENCOUNTER — Ambulatory Visit: Payer: BC Managed Care – PPO | Admitting: Family

## 2020-03-15 ENCOUNTER — Other Ambulatory Visit: Payer: Self-pay

## 2020-03-17 ENCOUNTER — Ambulatory Visit (INDEPENDENT_AMBULATORY_CARE_PROVIDER_SITE_OTHER): Payer: BC Managed Care – PPO | Admitting: Family

## 2020-03-17 ENCOUNTER — Other Ambulatory Visit: Payer: Self-pay

## 2020-03-17 ENCOUNTER — Encounter: Payer: Self-pay | Admitting: Family

## 2020-03-17 ENCOUNTER — Encounter: Payer: Self-pay | Admitting: Nurse Practitioner

## 2020-03-17 ENCOUNTER — Ambulatory Visit (INDEPENDENT_AMBULATORY_CARE_PROVIDER_SITE_OTHER): Payer: BC Managed Care – PPO | Admitting: Nurse Practitioner

## 2020-03-17 VITALS — BP 136/86 | HR 72 | Ht 62.0 in | Wt 186.0 lb

## 2020-03-17 VITALS — BP 130/86 | HR 70 | Temp 98.6°F | Ht 62.5 in | Wt 185.0 lb

## 2020-03-17 DIAGNOSIS — Z6833 Body mass index (BMI) 33.0-33.9, adult: Secondary | ICD-10-CM

## 2020-03-17 DIAGNOSIS — E559 Vitamin D deficiency, unspecified: Secondary | ICD-10-CM

## 2020-03-17 DIAGNOSIS — Z1231 Encounter for screening mammogram for malignant neoplasm of breast: Secondary | ICD-10-CM | POA: Diagnosis not present

## 2020-03-17 DIAGNOSIS — E6609 Other obesity due to excess calories: Secondary | ICD-10-CM

## 2020-03-17 DIAGNOSIS — I10 Essential (primary) hypertension: Secondary | ICD-10-CM

## 2020-03-17 DIAGNOSIS — Z86711 Personal history of pulmonary embolism: Secondary | ICD-10-CM | POA: Diagnosis not present

## 2020-03-17 DIAGNOSIS — Z0000 Encounter for general adult medical examination without abnormal findings: Secondary | ICD-10-CM | POA: Diagnosis not present

## 2020-03-17 DIAGNOSIS — Z7901 Long term (current) use of anticoagulants: Secondary | ICD-10-CM

## 2020-03-17 LAB — CBC WITH DIFFERENTIAL/PLATELET
Basophils Absolute: 0 10*3/uL (ref 0.0–0.1)
Basophils Relative: 0.3 % (ref 0.0–3.0)
Eosinophils Absolute: 0 10*3/uL (ref 0.0–0.7)
Eosinophils Relative: 0.8 % (ref 0.0–5.0)
HCT: 34 % — ABNORMAL LOW (ref 36.0–46.0)
Hemoglobin: 11.3 g/dL — ABNORMAL LOW (ref 12.0–15.0)
Lymphocytes Relative: 17.5 % (ref 12.0–46.0)
Lymphs Abs: 0.9 10*3/uL (ref 0.7–4.0)
MCHC: 33.3 g/dL (ref 30.0–36.0)
MCV: 87 fl (ref 78.0–100.0)
Monocytes Absolute: 0.4 10*3/uL (ref 0.1–1.0)
Monocytes Relative: 7.3 % (ref 3.0–12.0)
Neutro Abs: 3.6 10*3/uL (ref 1.4–7.7)
Neutrophils Relative %: 74.1 % (ref 43.0–77.0)
Platelets: 250 10*3/uL (ref 150.0–400.0)
RBC: 3.91 Mil/uL (ref 3.87–5.11)
RDW: 14.4 % (ref 11.5–15.5)
WBC: 4.9 10*3/uL (ref 4.0–10.5)

## 2020-03-17 LAB — VITAMIN D 25 HYDROXY (VIT D DEFICIENCY, FRACTURES): VITD: 10.89 ng/mL — ABNORMAL LOW (ref 30.00–100.00)

## 2020-03-17 LAB — COMPREHENSIVE METABOLIC PANEL
ALT: 9 U/L (ref 0–35)
AST: 15 U/L (ref 0–37)
Albumin: 4.2 g/dL (ref 3.5–5.2)
Alkaline Phosphatase: 41 U/L (ref 39–117)
BUN: 12 mg/dL (ref 6–23)
CO2: 25 mEq/L (ref 19–32)
Calcium: 9.1 mg/dL (ref 8.4–10.5)
Chloride: 104 mEq/L (ref 96–112)
Creatinine, Ser: 0.83 mg/dL (ref 0.40–1.20)
GFR: 92.05 mL/min (ref >60.00)
Glucose, Bld: 71 mg/dL (ref 70–99)
Potassium: 4 mEq/L (ref 3.5–5.1)
Sodium: 137 mEq/L (ref 135–145)
Total Bilirubin: 0.5 mg/dL (ref 0.2–1.2)
Total Protein: 7.6 g/dL (ref 6.0–8.3)

## 2020-03-17 LAB — LIPID PANEL
Cholesterol: 186 mg/dL (ref 0–200)
HDL: 39.6 mg/dL (ref >39.00)
LDL Cholesterol: 127 mg/dL — ABNORMAL HIGH (ref 0–99)
NonHDL: 146.59
Total CHOL/HDL Ratio: 5
Triglycerides: 97 mg/dL (ref 0.0–149.0)
VLDL: 19.4 mg/dL (ref 0.0–40.0)

## 2020-03-17 LAB — TSH: TSH: 0.68 u[IU]/mL (ref 0.35–4.50)

## 2020-03-17 LAB — B12 AND FOLATE PANEL
Folate: 8.4 ng/mL (ref >5.9)
Vitamin B-12: 233 pg/mL (ref 211–911)

## 2020-03-17 LAB — HEMOGLOBIN A1C: Hgb A1c MFr Bld: 5.7 % (ref 4.6–6.5)

## 2020-03-17 MED ORDER — APIXABAN 2.5 MG PO TABS: 2.5000 mg | ORAL_TABLET | Freq: Two times a day (BID) | ORAL | 11 refills | Status: DC

## 2020-03-17 MED ORDER — AMLODIPINE BESYLATE 10 MG PO TABS: 10.0000 mg | ORAL_TABLET | Freq: Every day | ORAL | 5 refills | Status: DC

## 2020-03-17 MED ORDER — LABETALOL HCL 200 MG PO TABS: 200.0000 mg | ORAL_TABLET | Freq: Two times a day (BID) | ORAL | 5 refills | Status: DC

## 2020-03-17 NOTE — Progress Notes (Addendum)
Established Patient Office Visit  Subjective:  Patient ID: Mary Roberts, female    DOB: 07-04-1980  Age: 39 y.o. MRN: [id number]  CC:  Chief Complaint  Patient presents with  . Transitions Of Care    med refill    HPI Mary Roberts is a 39 year old patient with history of preeclampsia, uncontrolled hypertension, pulmonary embolism-on Eliquis , bilateral pneumonia, history of seizures, anemia who comes in to transfer care . She most recently  saw  Lauren on  02/04/2019.  She has been followed by Cardiology for HTN and has appointment with them today.  She has been out of most of her medications.  She is followed by Hematology as well. Currently, [name] feels well.    HTN: Her blood pressures at home are running about 120-130/75-80.  She presents off of her amlodipine and hydralazine since last week, and took her labetalol last night.  She denies any chest pain, pressure, heaviness, tightness, lightheadedness or dizziness.  She has no difficulty taking the medication now. BP Readings from Last 3 Encounters:  03/17/20 136/86  03/17/20 130/86  12/07/19 (!) 145/88   BMI 34/Obesity:  Wt Readings from Last 3 Encounters:  03/17/20 186 lb (84.4 kg)  03/17/20 185 lb (83.9 kg)  12/07/19 186 lb 3.2 oz (84.5 kg)   History of PE/anemia followed by Dr. Tasia Catchings.  There was discussion regarding decreasing the Eliquis dose.  Patient was advised to obtain further blood work to see if she has a coagulation disorder.  Patient did not complete the bed blood work was advised to contact hematology and get that set up again.  Lab Results  Component Value Date   WBC 4.9 03/17/2020   HGB 11.3 (L) 03/17/2020   HCT 34.0 (L) 03/17/2020   MCV 87.0 03/17/2020   PLT 250.0 03/17/2020    History of seizure disorder: She has not had a seizure in a long time- cannot even remember when.  She has not having any spaced out events.  Patient says now the only time she feels a little weird is when she is in her car  after lunch and she wants to take a quick nap, she feels like she cannot relax all the way.  She is not sure if it is just her trying to make sure she does not sleep too long since she is at work, or if something else is going on. She will monitor.    Immunizations: She has had Covid vaccines.  She needs to have Tdap and will check on her records.  She needs flu vaccine and declines today. Diet:healthy Exercise: walks Colonoscopy: not yet.  Dexa: none Pap Smear: Hysterectomy and reports that she does not have a cervix.  Noncancerous. Mammogram: needs first screening   Past Medical History:  Diagnosis Date  . Anemia   . Anxiety    panic attacks  . Carpal tunnel syndrome, bilateral   . Carpal tunnel syndrome, bilateral   . Chlamydia   . Epilepsy seizure, nonconvulsive, generalized ([place])   . Headache(784.0)    migraines  . History of C-section   . Hypertension   . Hypertensive heart disease   . Infection    UTI  . LVH (left ventricular hypertrophy)    a. 12/2018 Echo: EF 60-65%, sev inc LV wall thickness. Diast dysfxn. Triv AI.  Marland Kitchen Menorrhagia   . Migraines   . Pregnancy induced hypertension    with first  . Pulmonary embolism ([place])    a. 12/2018  CTA Chest (pleuritic c/p): probable small, nonocclusive PE w/in subsegmental RUL branch vessel. Artifact noted as well.  Eliquis initiatied.  . Seizures ([place])    No meds, off since in mid to late 20's  . UTI (urinary tract infection)     Past Surgical History:  Procedure Laterality Date  . CESAREAN SECTION    . CYSTOSCOPY N/A 06/29/2019   Procedure: CYSTOSCOPY;  Surgeon: Will Bonnet, MD;  Location: [facility];  Service: Gynecology;  Laterality: N/A;  . MOUTH SURGERY    . TOTAL LAPAROSCOPIC HYSTERECTOMY WITH SALPINGECTOMY N/A 06/29/2019   Procedure: TOTAL LAPAROSCOPIC HYSTERECTOMY WITH SALPINGECTOMY;  Surgeon: Will Bonnet, MD;  Location: [facility];  Service: Gynecology;  Laterality: N/A;  . TUBAL LIGATION N/A 10/30/2013    Procedure: POST PARTUM TUBAL LIGATION;  Surgeon: [name], MD;  Location: [facility];  Service: Gynecology;  Laterality: N/A;    Family History  Problem Relation Age of Onset  . Seizures Mother   . Hypertension Mother   . Diabetes Mother   . Heart disease Mother   . Stroke Mother   . Arthritis Mother   . Seizures Brother   . Hearing loss Neg Hx     Social History   Socioeconomic History  . Marital status: Single    Spouse name: Not on file  . Number of children: Not on file  . Years of education: Not on file  . Highest education level: Not on file  Occupational History  . Not on file  Tobacco Use  . Smoking status: Never Smoker  . Smokeless tobacco: Never Used  . Tobacco comment: has smoked 1 pack in 1 week her life  Vaping Use  . Vaping Use: Never used  Substance and Sexual Activity  . Alcohol use: Yes    Comment: socially a few drinks a week or two  . Drug use: No  . Sexual activity: Yes    Birth control/protection: Surgical  Other Topics Concern  . Not on file  Social History Narrative   Teacher- The growing years      Single- 3 kids ages 12-06-15   Social Determinants of Health   Financial Resource Strain:   . Difficulty of Paying Living Expenses: Not on file  Food Insecurity:   . Worried About Charity fundraiser in the Last Year: Not on file  . Ran Out of Food in the Last Year: Not on file  Transportation Needs:   . Lack of Transportation (Medical): Not on file  . Lack of Transportation (Non-Medical): Not on file  Physical Activity:   . Days of Exercise per Week: Not on file  . Minutes of Exercise per Session: Not on file  Stress:   . Feeling of Stress : Not on file  Social Connections:   . Frequency of Communication with Friends and Family: Not on file  . Frequency of Social Gatherings with Friends and Family: Not on file  . Attends Religious Services: Not on file  . Active Member of Clubs or Organizations: Not on file  . Attends Theatre manager Meetings: Not on file  . Marital Status: Not on file  Intimate Partner Violence:   . Fear of Current or Ex-Partner: Not on file  . Emotionally Abused: Not on file  . Physically Abused: Not on file  . Sexually Abused: Not on file    Outpatient Medications Prior to Visit  Medication Sig Dispense Refill  . amLODipine (NORVASC) 10 MG tablet TAKE  1 TABLET(10 MG) BY MOUTH DAILY 30 tablet 0  . aspirin-acetaminophen-caffeine (EXCEDRIN MIGRAINE) 950-932-67 MG tablet Take 2 tablets by mouth every 6 (six) hours as needed for headache.     Marland Kitchen ELIQUIS 5 MG TABS tablet TAKE 1 TABLET(5 MG) BY MOUTH TWICE DAILY 60 tablet 6  . hydrALAZINE (APRESOLINE) 25 MG tablet Take 1 tablet (25 mg total) by mouth 2 (two) times daily. Take 1 tablet (25 mg) by mouth twice daily    . labetalol (NORMODYNE) 200 MG tablet Take 1 tablet (200 mg total) by mouth 2 (two) times daily.    Marland Kitchen ibuprofen (ADVIL) 600 MG tablet Take 1 tablet (600 mg total) by mouth every 8 (eight) hours. Take every 8 hours for first two days postop, then as needed every 8 hours. 30 tablet 0  . Simethicone (GAS-X PO) Take 1 tablet by mouth daily as needed (trapped gas in chest).      No facility-administered medications prior to visit.    Allergies  Allergen Reactions  . Amoxicillin Hives and Other (See Comments)    Has patient had a PCN reaction causing immediate rash, facial/tongue/throat swelling, SOB or lightheadedness with hypotension: No Has patient had a PCN reaction causing severe rash involving mucus membranes or skin necrosis: No Has patient had a PCN reaction that required hospitalization: No Has patient had a PCN reaction occurring within the last 10 years: Yes If all of the above answers are "NO", then may proceed with Cephalosporin use.     Review of Systems  Constitutional: Negative.   HENT: Negative.   Eyes: Negative.   Respiratory: Negative.   Cardiovascular: Negative.   Gastrointestinal: Negative.   Endocrine:  Negative.   Genitourinary: Negative.   Musculoskeletal: Negative.   Skin: Negative.   Allergic/Immunologic: Negative.   Neurological: Negative.   Hematological: Negative.   Psychiatric/Behavioral: Negative.       Objective:    Physical Exam Constitutional:      Appearance: Normal appearance. She is obese.  HENT:     Head: Normocephalic and atraumatic.  Eyes:     Conjunctiva/sclera: Conjunctivae normal.     Pupils: Pupils are equal, round, and reactive to light.  Cardiovascular:     Rate and Rhythm: Normal rate and regular rhythm.     Pulses: Normal pulses.     Heart sounds: Normal heart sounds.  Pulmonary:     Effort: Pulmonary effort is normal.     Breath sounds: Normal breath sounds.  Abdominal:     Palpations: Abdomen is soft.     Tenderness: There is no abdominal tenderness.  Musculoskeletal:        General: Normal range of motion.  Skin:    General: Skin is warm and dry.  Neurological:     General: No focal deficit present.     Mental Status: She is alert and oriented to person, place, and time.  Psychiatric:        Mood and Affect: Mood normal.        Behavior: Behavior normal.        Thought Content: Thought content normal.        Judgment: Judgment normal.     Comments: No concerns about depression or anxiety.  PHQ-9: 2 gad-7: 2     BP 130/86 (BP Location: Left Arm, Patient Position: Sitting, Cuff Size: Normal)   Pulse 70   Temp 98.6 F (37 C) (Oral)   Ht 5' 2.5" (1.588 m)   Wt 185 lb (83.9 kg)  SpO2 99%   BMI 33.30 kg/m  Wt Readings from Last 3 Encounters:  03/17/20 185 lb (83.9 kg)  12/07/19 186 lb 3.2 oz (84.5 kg)  11/19/19 183 lb (83 kg)   Pulse Readings from Last 3 Encounters:  03/17/20 70  12/07/19 71  11/19/19 79    BP Readings from Last 3 Encounters:  03/17/20 130/86  12/07/19 (!) 145/88  11/19/19 122/76    No results found for: CHOL, HDL, LDLCALC, LDLDIRECT, TRIG, CHOLHDL    Health Maintenance Due  Topic Date Due  .  Hepatitis C Screening  Never done    There are no preventive care reminders to display for this patient.  Lab Results  Component Value Date   TSH 1.693 01/07/2019   Lab Results  Component Value Date   WBC 5.9 06/24/2019   HGB 11.2 (L) 06/29/2019   HCT 33.0 (L) 06/29/2019   MCV 86.0 06/24/2019   PLT 281 06/24/2019   Lab Results  Component Value Date   NA 140 06/29/2019   K 3.1 (L) 06/29/2019   CO2 27 06/24/2019   GLUCOSE 108 (H) 06/29/2019   BUN 12 06/29/2019   CREATININE 1.00 06/29/2019   BILITOT 0.5 06/24/2019   ALKPHOS 41 06/24/2019   AST 29 06/24/2019   ALT 15 06/24/2019   PROT 8.2 (H) 06/24/2019   ALBUMIN 4.4 06/24/2019   CALCIUM 9.0 06/24/2019   ANIONGAP 10 06/24/2019   No results found for: CHOL No results found for: HDL No results found for: LDLCALC No results found for: TRIG No results found for: CHOLHDL No results found for: HGBA1C    Assessment & Plan:   Problem List Items Addressed This Visit      Cardiovascular and Mediastinum   HTN (hypertension) - Primary   Relevant Orders   CBC with Differential/Platelet   Comprehensive metabolic panel    Other Visit Diagnoses    History of pulmonary embolus (PE)       Class 1 obesity due to excess calories with body mass index (BMI) of 33.0 to 33.9 in adult, unspecified whether serious comorbidity present       Relevant Orders   CBC with Differential/Platelet   TSH   Hemoglobin A1c   Lipid panel   Well woman exam without gynecological exam       Relevant Orders   VITAMIN D 25 Hydroxy (Vit-D Deficiency, Fractures)   B12 and Folate Panel   Hepatitis C antibody   Encounter for screening mammogram for malignant neoplasm of breast       Relevant Orders   MM 3D SCREEN BREAST BILATERAL      No orders of the defined types were placed in this encounter.   Please go to the lab today for routine blood work.  I have ordered a screening mammogram for you.  You do have to call to set this up. Please call  and schedule your 3D mammogram as discussed.  Phone number provided on AVS.  Continue healthy lifestyle, regular exercise weight loss.  Recommend dentist every 6, eye exam every year.  Sunscreen with SPF 94 when you go outdoors.  Immunizations need to be tetanus vaccine every 10 years flu shot this year, you are up-to-date on your Covid vaccine.   Patient advised to follow-up with cardiology and to continue to work with them regarding her blood pressure management.  We discussed the importance of not running out of medication and call if she needs refills.  Talk to your cardiologist and  hematologist about the duration for the Eliquis.  Talk to your hematologist about getting specialty labs drawn that  were ordered in June.  Follow-up office visit with me in 6 months.  Call back sooner if you have any problems  Follow-up: No follow-ups on file.   This visit occurred during the SARS-CoV-2 public health emergency.  Safety protocols were in place, including screening questions prior to the visit, additional usage of staff PPE, and extensive cleaning of exam room while observing appropriate contact time as indicated for disinfecting solutions.   Denice Paradise, NP

## 2020-03-17 NOTE — Progress Notes (Signed)
Office Visit    Patient Name: Mary Roberts Date of Encounter: 03/17/2020  Primary Care Provider:  Marval Regal, NP Primary Cardiologist:  Nelva Bush, MD Electrophysiologist:  None   Chief Complaint    SMT LOKEY is a 39 y.o. female with a hx of PE, HTN, preeclampsia, seizure disorder presents today for follow up of HTN.   Past Medical History    Past Medical History:  Diagnosis Date  . Anemia   . Anxiety    panic attacks  . Carpal tunnel syndrome, bilateral   . Carpal tunnel syndrome, bilateral   . Chlamydia   . Epilepsy seizure, nonconvulsive, generalized ([place])   . Headache(784.0)    migraines  . History of C-section   . Hypertension   . Hypertensive heart disease   . Infection    UTI  . LVH (left ventricular hypertrophy)    a. 12/2018 Echo: EF 60-65%, sev inc LV wall thickness. Diast dysfxn. Triv AI.  Marland Kitchen Menorrhagia   . Migraines   . Pregnancy induced hypertension    with first  . Pulmonary embolism ([place])    a. 12/2018 CTA Chest (pleuritic c/p): probable small, nonocclusive PE w/in subsegmental RUL branch vessel. Artifact noted as well.  Eliquis initiatied.  . Seizures ([place])    No meds, off since in mid to late 20's  . UTI (urinary tract infection)    Past Surgical History:  Procedure Laterality Date  . CESAREAN SECTION    . CYSTOSCOPY N/A 06/29/2019   Procedure: CYSTOSCOPY;  Surgeon: Will Bonnet, MD;  Location: [facility] ORS;  Service: Gynecology;  Laterality: N/A;  . MOUTH SURGERY    . TOTAL LAPAROSCOPIC HYSTERECTOMY WITH SALPINGECTOMY N/A 06/29/2019   Procedure: TOTAL LAPAROSCOPIC HYSTERECTOMY WITH SALPINGECTOMY;  Surgeon: Will Bonnet, MD;  Location: [facility];  Service: Gynecology;  Laterality: N/A;  . TUBAL LIGATION N/A 10/30/2013   Procedure: POST PARTUM TUBAL LIGATION;  Surgeon: [name], MD;  Location: [facility];  Service: Gynecology;  Laterality: N/A;    Allergies  Allergies  Allergen Reactions  . Amoxicillin Hives and  Other (See Comments)    Has patient had a PCN reaction causing immediate rash, facial/tongue/throat swelling, SOB or lightheadedness with hypotension: No Has patient had a PCN reaction causing severe rash involving mucus membranes or skin necrosis: No Has patient had a PCN reaction that required hospitalization: No Has patient had a PCN reaction occurring within the last 10 years: Yes If all of the above answers are "NO", then may proceed with Cephalosporin use.     History of Present Illness    Mary Roberts is a 39 y.o. female with a hx of PE, HTN, preeclampsia, seizure disorder last seen 11/19/19 by Dr. Saunders Revel.  July 2020 she was seen in the ED with uncontrolled hypertension, chest pain, dyspnea.  CT angiography with probable nonocclusive PE within segmental right upper lobe branch vessel.  Lower extremity duplex negative for DVT.  She was admitted for blood pressure management and anticoagulation.  Echo with normal LVEF, diastolic dysfunction, severe LVH.  Seen in late July by Dr. Saunders Revel and medications consolidated.  Follow-up 02/25/2027 blood pressure was stable.  Her hydralazine was reduced to 25 mg twice daily.  Unfortunately, in setting of oral anticoagulation she was admitted 03/03/2019 with abnormal uterine bleeding and severe anemia with a hemoglobin of 6.5 requiring 2 units of PRBC.  She ultimately underwent total laparoscopic hysterectomy with bilateral salpingectomy 06/2019.  Seen by Dr. Saunders Revel  in follow-up 11/19/2019.  In effort to continue consolidating her antihypertensive regimen chlorthalidone was discontinued.  She was also referred to hematology for discussion of hypercoagulable work-up in setting of unprovoked PE.  Her labetalol, hydralazine, amlodipine were continued.  She saw Dr. Tasia Catchings of oncology 12/07/19. She was recommended to hold Eliquis for 3 days and undergo hypercoagulable workup then change Eliquis to 2.5mg  twice daily. She has not yet completed this blood work.   Presents today  for overdue follow-up. BP at home when checked 120s-130s/80s.  She has run out of multiple of her antihypertensive medications.  She last took her amlodipine last week and, last took labetalol last night, last dose of hydralazine greater than 1 week ago.  She is interested in continuing to consolidate her antihypertensive regimen.  Reports no shortness of breath nor dyspnea on exertion. Reports no chest pain, pressure, or tightness. No edema, orthopnea, PND. Reports no palpitations.   EKGs/Labs/Other Studies Reviewed:   The following studies were reviewed today:  Echo 12/2018 1. The left ventricle has normal systolic function with an ejection  fraction of 60-65%. The cavity size was normal. There is severely  increased left ventricular wall thickness. Left ventricular diastolic  Doppler parameters are consistent with impaired  relaxation. Elevated left ventricular end-diastolic pressure No evidence  of left ventricular regional wall motion abnormalities.   2. The right ventricle has normal systolic function. The cavity was  normal. There is no increase in right ventricular wall thickness.   3. The mitral valve is grossly normal.   4. The tricuspid valve is grossly normal.   5. The aortic valve is grossly normal. Mild thickening of the aortic  valve. Mild calcification of the aortic valve. Aortic valve regurgitation  is trivial by color flow Doppler. No stenosis of the aortic valve.   6. The aortic root is normal in size and structure.   EKG:  No EKG today.  Recent Labs: 06/24/2019: ALT 15; Platelets 281 06/29/2019: BUN 12; Creatinine, Ser 1.00; Hemoglobin 11.2; Potassium 3.1; Sodium 140  Recent Lipid Panel No results found for: CHOL, TRIG, HDL, CHOLHDL, VLDL, LDLCALC, LDLDIRECT  Home Medications   Current Meds  Medication Sig  . amLODipine (NORVASC) 10 MG tablet TAKE 1 TABLET(10 MG) BY MOUTH DAILY  . aspirin-acetaminophen-caffeine (EXCEDRIN MIGRAINE) 250-250-65 MG tablet Take 2  tablets by mouth every 6 (six) hours as needed for headache.   Marland Kitchen ELIQUIS 5 MG TABS tablet TAKE 1 TABLET(5 MG) BY MOUTH TWICE DAILY  . hydrALAZINE (APRESOLINE) 25 MG tablet Take 1 tablet (25 mg total) by mouth 2 (two) times daily. Take 1 tablet (25 mg) by mouth twice daily  . labetalol (NORMODYNE) 200 MG tablet Take 1 tablet (200 mg total) by mouth 2 (two) times daily.    Review of Systems     Review of Systems  Constitutional: Negative for chills, fever and malaise/fatigue.  Cardiovascular: Negative for chest pain, dyspnea on exertion, leg swelling, near-syncope, orthopnea, palpitations and syncope.  Respiratory: Negative for cough, shortness of breath and wheezing.   Gastrointestinal: Negative for nausea and vomiting.  Neurological: Negative for dizziness, light-headedness and weakness.   All other systems reviewed and are otherwise negative except as noted above.  Physical Exam    VS:  BP 136/86 (BP Location: Right Arm, Patient Position: Sitting, Cuff Size: Normal)   Pulse 72   Ht 5\' 2"  (1.575 m)   Wt 186 lb (84.4 kg)   SpO2 98%   BMI 34.02 kg/m  , BMI  Body mass index is 34.02 kg/m. GEN: Well nourished, overweight, well developed, in no acute distress. HEENT: normal. Neck: Supple, no JVD, carotid bruits, or masses. Cardiac: RRR, no murmurs, rubs, or gallops. No clubbing, cyanosis, edema.  Radials/DP/PT 2+ and equal bilaterally.  Respiratory:  Respirations regular and unlabored, clear to auscultation bilaterally. GI: Soft, nontender, nondistended, BS + x 4. MS: No deformity or atrophy. Skin: Warm and dry, no rash. Neuro:  Strength and sensation are intact. Psych: Normal affect.  Assessment & Plan    1. HTN -  Elevated in clinic today as she has been out of multiple medications.  Reports BP at home 120s to 130s over 80s.  We will refill labetalol 100 mg twice daily and amlodipine 10 mg daily.  She has not been hydralazine 25mg  BID for greater than 1 week and reports blood  pressures have stayed well controlled so we will defer reinitiation at this time. If need for additional agent, consider Losartan as BID dosing is difficult for her.   Multiple labs collected back by primary care provider today.  Will review when available.   2. Hx of PE/Chronic anticoagulation- Unprovoked PE 12/2018. Referred to hematology for hypercoagulable work-up.  Seen by Dr. Tasia Catchings 12/12/2018.  Recommended to hold Eliquis for 3 days prior to proceeding with hypercoagulable work-up.  After that recommended to proceed with Eliquis 2.5 mg twice daily.  She has not actively do hypercoagulable work-up.  Encouraged to complete and given hematology's office number.  Refill Eliquis at reduced dose 2.5 mg twice daily per hematology recommendations.    Disposition: MyChart message in 1 week to check in on blood pressure.  Follow up in 1 month(s) with Dr. Saunders Revel or APP   Loel Dubonnet, NP 03/17/2020, 3:10 PM

## 2020-03-17 NOTE — Patient Instructions (Addendum)
Medication Instructions:  Your provider has recommended you make the following change in your medication:   Stop Hydralazine 25 mg tablet twice daily.  CHANGE Eliquis to 2.5mg  twice daily  CONTINUE Labetolol 100mg  twice daily  RESUME Amlodipine 10mg  daily  *If you need a refill on your cardiac medications before your next appointment, please call your pharmacy*  Lab Work: None ordered today.   Testing/Procedures: None ordered today.  Follow-Up: At [facility], you and your health needs are our priority.  As part of our continuing mission to provide you with exceptional heart care, we have created designated Provider Care Teams.  These Care Teams include your primary Cardiologist (physician) and Advanced Practice Providers (APPs -  Physician Assistants and Nurse Practitioners) who all work together to provide you with the care you need, when you need it.  We recommend signing up for the patient portal called "MyChart".  Sign up information is provided on this After Visit Summary.  MyChart is used to connect with patients for Virtual Visits (Telemedicine).  Patients are able to view lab/test results, encounter notes, upcoming appointments, etc.  Non-urgent messages can be sent to your provider as well.   To learn more about what you can do with MyChart, go to [email or web address].    Your next appointment:   4 week(s)  The format for your next appointment:   In Person  Provider:   You may see Nelva Bush, MD or one of the following Advanced Practice Providers on your designated Care Team:   Murray Hodgkins, NP  Christell Faith, PA-C  Laurann Montana, NP  Marrianne Mood, PA-C  Cadence Kathlen Mody, [place]   Other Instructions  Recommend call Dr. Collie Siad office regarding blood work to check for hypercoagulability (blood clotting).[phone]   Tips to Measure your Blood Pressure Correctly  Here's what you can do to ensure a correct reading:  Don't drink a  caffeinated beverage or smoke during the 30 minutes before the test.  Sit quietly for five minutes before the test begins.  During the measurement, sit in a chair with your feet on the floor and your arm supported so your elbow is at about heart level.  The inflatable part of the cuff should completely cover at least 80% of your upper arm, and the cuff should be placed on bare skin, not over a shirt.  Don't talk during the measurement.  Have your blood pressure measured twice, with a brief break in between. If the readings are different by 5 points or more, have it done a third time.  There are times to break these rules. If you sometimes feel lightheaded when getting out of bed in the morning or when you stand after sitting, you should have your blood pressure checked while seated and then while standing to see if it falls from one position to the next.  In 2017, new guidelines from the [employer], the [employer] of Cardiology, and nine other health organizations lowered the diagnosis of high blood pressure to 130/80 mm Hg or higher for all adults. The guidelines also redefined the various blood pressure categories to now include normal, elevated, Stage 1 hypertension, Stage 2 hypertension, and hypertensive crisis (see "Blood pressure categories").  Blood pressure categories  Blood pressure category SYSTOLIC (upper number)  DIASTOLIC (lower number)  Normal Less than 120 mm Hg and Less than 80 mm Hg  Elevated 120-129 mm Hg and Less than 80 mm Hg  High blood pressure: Stage 1 hypertension 130-139 mm  Hg or 80-89 mm Hg  High blood pressure: Stage 2 hypertension 140 mm Hg or higher or 90 mm Hg or higher  Hypertensive crisis (consult your doctor immediately) Higher than 180 mm Hg and/or Higher than 120 mm Hg  Source: American Heart Association and American Stroke Association. For more on getting your blood pressure under control, buy Controlling Your Blood Pressure, a  Special Health Report from [facility].   Blood Pressure Log   Date   Time  Blood Pressure  Position  Example: Nov 1 9 AM 124/78 sitting

## 2020-03-17 NOTE — Patient Instructions (Addendum)
Please go to the lab today for routine blood work.  I have ordered a screening mammogram for you.  You do have to call to set this up. Please call and schedule your 3D mammogram as discussed.  [facility]  [address] [place], [place] [phone]  Continue healthy lifestyle, regular exercise weight loss.  Recommend dentist every 6, eye exam every year.  Sunscreen with SPF 40 when you go outdoors.  Immunizations need to be tetanus vaccine every 10 years flu shot this year, you are up-to-date on your Covid vaccine.   Talk to your cardiologist and hematologist about the duration for the Eliquis.  Talk to your hematologist about getting specialty labs drawn that  were ordered in June.  Follow-up office visit with me in 6 months.  Call back sooner if you have any problems Preventive Care 19-50 Years Old, Female Preventive care refers to visits with your health care provider and lifestyle choices that can promote health and wellness. This includes:  A yearly physical exam. This may also be called an annual well check.  Regular dental visits and eye exams.  Immunizations.  Screening for certain conditions.  Healthy lifestyle choices, such as eating a healthy diet, getting regular exercise, not using drugs or products that contain nicotine and tobacco, and limiting alcohol use. What can I expect for my preventive care visit? Physical exam Your health care provider will check your:  Height and weight. This may be used to calculate body mass index (BMI), which tells if you are at a healthy weight.  Heart rate and blood pressure.  Skin for abnormal spots. Counseling Your health care provider may ask you questions about your:  Alcohol, tobacco, and drug use.  Emotional well-being.  Home and relationship well-being.  Sexual activity.  Eating habits.  Work and work Statistician.  Method of birth control.  Menstrual cycle.  Pregnancy  history. What immunizations do I need?  Influenza (flu) vaccine  This is recommended every year. Tetanus, diphtheria, and pertussis (Tdap) vaccine  You may need a Td booster every 10 years. Varicella (chickenpox) vaccine  You may need this if you have not been vaccinated. Zoster (shingles) vaccine  You may need this after age 18. Measles, mumps, and rubella (MMR) vaccine  You may need at least one dose of MMR if you were born in 1957 or later. You may also need a second dose. Pneumococcal conjugate (PCV13) vaccine  You may need this if you have certain conditions and were not previously vaccinated. Pneumococcal polysaccharide (PPSV23) vaccine  You may need one or two doses if you smoke cigarettes or if you have certain conditions. Meningococcal conjugate (MenACWY) vaccine  You may need this if you have certain conditions. Hepatitis A vaccine  You may need this if you have certain conditions or if you travel or work in places where you may be exposed to hepatitis A. Hepatitis B vaccine  You may need this if you have certain conditions or if you travel or work in places where you may be exposed to hepatitis B. Haemophilus influenzae type b (Hib) vaccine  You may need this if you have certain conditions. Human papillomavirus (HPV) vaccine  If recommended by your health care provider, you may need three doses over 6 months. You may receive vaccines as individual doses or as more than one vaccine together in one shot (combination vaccines). Talk with your health care provider about the risks and benefits of combination vaccines. What tests do I  need? Blood tests  Lipid and cholesterol levels. These may be checked every 5 years, or more frequently if you are over 71 years old.  Hepatitis C test.  Hepatitis B test. Screening  Lung cancer screening. You may have this screening every year starting at age 63 if you have a 30-pack-year history of smoking and currently smoke or  have quit within the past 15 years.  Colorectal cancer screening. All adults should have this screening starting at age 82 and continuing until age 73. Your health care provider may recommend screening at age 39 if you are at increased risk. You will have tests every 1-10 years, depending on your results and the type of screening test.  Diabetes screening. This is done by checking your blood sugar (glucose) after you have not eaten for a while (fasting). You may have this done every 1-3 years.  Mammogram. This may be done every 1-2 years. Talk with your health care provider about when you should start having regular mammograms. This may depend on whether you have a family history of breast cancer.  BRCA-related cancer screening. This may be done if you have a family history of breast, ovarian, tubal, or peritoneal cancers.  Pelvic exam and Pap test. This may be done every 3 years starting at age 31. Starting at age 93, this may be done every 5 years if you have a Pap test in combination with an HPV test. Other tests  Sexually transmitted disease (STD) testing.  Bone density scan. This is done to screen for osteoporosis. You may have this scan if you are at high risk for osteoporosis. Follow these instructions at home: Eating and drinking  Eat a diet that includes fresh fruits and vegetables, whole grains, lean protein, and low-fat dairy.  Take vitamin and mineral supplements as recommended by your health care provider.  Do not drink alcohol if: ? Your health care provider tells you not to drink. ? You are pregnant, may be pregnant, or are planning to become pregnant.  If you drink alcohol: ? Limit how much you have to 0-1 drink a day. ? Be aware of how much alcohol is in your drink. In the [place] one drink equals one 12 oz bottle of beer (355 mL), one 5 oz glass of wine (148 mL), or one 1 oz glass of hard liquor (44 mL). Lifestyle  Take daily care of your teeth and gums.  Stay  active. Exercise for at least 30 minutes on 5 or more days each week.  Do not use any products that contain nicotine or tobacco, such as cigarettes, e-cigarettes, and chewing tobacco. If you need help quitting, ask your health care provider.  If you are sexually active, practice safe sex. Use a condom or other form of birth control (contraception) in order to prevent pregnancy and STIs (sexually transmitted infections).  If told by your health care provider, take low-dose aspirin daily starting at age 56. What's next?  Visit your health care provider once a year for a well check visit.  Ask your health care provider how often you should have your eyes and teeth checked.  Stay up to date on all vaccines. This information is not intended to replace advice given to you by your health care provider. Make sure you discuss any questions you have with your health care provider. Document Revised: 02/19/2018 Document Reviewed: 02/19/2018 Elsevier Patient Education  2020 [employer].

## 2020-03-19 ENCOUNTER — Encounter: Payer: Self-pay | Admitting: Nurse Practitioner

## 2020-03-19 MED ORDER — VITAMIN D (ERGOCALCIFEROL) 1.25 MG (50000 UNIT) PO CAPS: 50000.0000 [IU] | ORAL_CAPSULE | ORAL | 0 refills | Status: DC

## 2020-03-20 LAB — HEPATITIS C ANTIBODY
Hepatitis C Ab: NONREACTIVE
SIGNAL TO CUT-OFF: 0.02 (ref <1.00)

## 2020-03-23 ENCOUNTER — Encounter: Payer: Self-pay | Admitting: Family

## 2020-04-14 ENCOUNTER — Ambulatory Visit: Payer: BC Managed Care – PPO | Admitting: Family

## 2020-05-01 ENCOUNTER — Ambulatory Visit (INDEPENDENT_AMBULATORY_CARE_PROVIDER_SITE_OTHER): Payer: BC Managed Care – PPO | Admitting: Family

## 2020-05-01 ENCOUNTER — Encounter: Payer: Self-pay | Admitting: Family

## 2020-05-01 ENCOUNTER — Other Ambulatory Visit: Payer: Self-pay

## 2020-05-01 VITALS — BP 154/108 | HR 68 | Ht 62.0 in | Wt 191.0 lb

## 2020-05-01 DIAGNOSIS — I10 Essential (primary) hypertension: Secondary | ICD-10-CM

## 2020-05-01 DIAGNOSIS — Z7901 Long term (current) use of anticoagulants: Secondary | ICD-10-CM | POA: Diagnosis not present

## 2020-05-01 DIAGNOSIS — Z86711 Personal history of pulmonary embolism: Secondary | ICD-10-CM

## 2020-05-01 MED ORDER — AMLODIPINE BESYLATE 10 MG PO TABS: 10.0000 mg | ORAL_TABLET | Freq: Every day | ORAL | 2 refills | Status: DC

## 2020-05-01 MED ORDER — LOSARTAN POTASSIUM 25 MG PO TABS: 25.0000 mg | ORAL_TABLET | Freq: Every day | ORAL | 2 refills | Status: DC

## 2020-05-01 NOTE — Progress Notes (Signed)
Office Visit    Patient Name: Mary Roberts Date of Encounter: 05/01/2020  Primary Care Provider:  Marval Regal, NP Primary Cardiologist:  Nelva Bush, MD Electrophysiologist:  None   Chief Complaint    Mary Roberts is a 39 y.o. female with a hx of PE, HTN, preeclampsia, seizure disorder presents today for follow up of HTN.   Past Medical History    Past Medical History:  Diagnosis Date  . Anemia   . Anxiety    panic attacks  . Carpal tunnel syndrome, bilateral   . Carpal tunnel syndrome, bilateral   . Chlamydia   . Epilepsy seizure, nonconvulsive, generalized ([place])   . Headache(784.0)    migraines  . History of C-section   . Hypertension   . Hypertensive heart disease   . Infection    UTI  . LVH (left ventricular hypertrophy)    a. 12/2018 Echo: EF 60-65%, sev inc LV wall thickness. Diast dysfxn. Triv AI.  Marland Kitchen Menorrhagia   . Migraines   . Pregnancy induced hypertension    with first  . Pulmonary embolism ([place])    a. 12/2018 CTA Chest (pleuritic c/p): probable small, nonocclusive PE w/in subsegmental RUL branch vessel. Artifact noted as well.  Eliquis initiatied.  . Seizures ([place])    No meds, off since in mid to late 20's  . UTI (urinary tract infection)    Past Surgical History:  Procedure Laterality Date  . ABDOMINAL HYSTERECTOMY    . CESAREAN SECTION    . CYSTOSCOPY N/A 06/29/2019   Procedure: CYSTOSCOPY;  Surgeon: Will Bonnet, MD;  Location: [facility];  Service: Gynecology;  Laterality: N/A;  . MOUTH SURGERY    . TOTAL LAPAROSCOPIC HYSTERECTOMY WITH SALPINGECTOMY N/A 06/29/2019   Procedure: TOTAL LAPAROSCOPIC HYSTERECTOMY WITH SALPINGECTOMY;  Surgeon: Will Bonnet, MD;  Location: [facility];  Service: Gynecology;  Laterality: N/A;  . TUBAL LIGATION N/A 10/30/2013   Procedure: POST PARTUM TUBAL LIGATION;  Surgeon: [name], MD;  Location: [facility];  Service: Gynecology;  Laterality: N/A;    Allergies  Allergies  Allergen Reactions   . Amoxicillin Hives and Other (See Comments)    Has patient had a PCN reaction causing immediate rash, facial/tongue/throat swelling, SOB or lightheadedness with hypotension: No Has patient had a PCN reaction causing severe rash involving mucus membranes or skin necrosis: No Has patient had a PCN reaction that required hospitalization: No Has patient had a PCN reaction occurring within the last 10 years: Yes If all of the above answers are "NO", then may proceed with Cephalosporin use.     History of Present Illness    Mary Roberts is a 39 y.o. female with a hx of PE, HTN, preeclampsia, seizure disorder last seen 03/17/20.  July 2020 she was seen in the ED with uncontrolled hypertension, chest pain, dyspnea.  CT angiography with probable nonocclusive PE within segmental right upper lobe branch vessel.  Lower extremity duplex negative for DVT.  She was admitted for blood pressure management and anticoagulation.  Echo with normal LVEF, diastolic dysfunction, severe LVH.  Seen in late July by Dr. Saunders Revel and medications consolidated.  Follow-up 02/25/2027 blood pressure was stable.  Her hydralazine was reduced to 25 mg twice daily.  Unfortunately, in setting of oral anticoagulation she was admitted 03/03/2019 with abnormal uterine bleeding and severe anemia with a hemoglobin of 6.5 requiring 2 units of PRBC.  She ultimately underwent total laparoscopic hysterectomy with bilateral salpingectomy 06/2019.  Seen  by Dr. Saunders Revel in follow-up 11/19/2019.  In effort to continue consolidating her antihypertensive regimen chlorthalidone was discontinued.  She was also referred to hematology for discussion of hypercoagulable work-up in setting of unprovoked PE.  Her labetalol, hydralazine, amlodipine were continued.  She saw Dr. Tasia Catchings of oncology 12/07/19. She was recommended to hold Eliquis for 3 days and undergo hypercoagulable workup then change Eliquis to 2.5mg  twice daily. She has not yet completed this blood work.    Seen in follow up 03/17/20. Reported home BP 120s-130s/80s when on antihypertensive medications though had run out of multiple.  She was interested in continuing to consolidate her antihypertensive regimen. Previous intolerance of HCTZ and Lisinopril.  Tells me her BP has been up and down at home. She is having difficulty remembering to take her morning medications. She remembers approximately 3 times per week. She does consistently take her Labetalol and Amlodipine in the evening. Previous reaction to HCTZ. She reports she had nausea with Lisinopril.  Reports no shortness of breath nor dyspnea on exertion. Reports no chest pain, pressure, or tightness. No edema, orthopnea, PND. Reports no palpitations.   EKGs/Labs/Other Studies Reviewed:   The following studies were reviewed today:  Echo 12/2018 1. The left ventricle has normal systolic function with an ejection  fraction of 60-65%. The cavity size was normal. There is severely  increased left ventricular wall thickness. Left ventricular diastolic  Doppler parameters are consistent with impaired  relaxation. Elevated left ventricular end-diastolic pressure No evidence  of left ventricular regional wall motion abnormalities.   2. The right ventricle has normal systolic function. The cavity was  normal. There is no increase in right ventricular wall thickness.   3. The mitral valve is grossly normal.   4. The tricuspid valve is grossly normal.   5. The aortic valve is grossly normal. Mild thickening of the aortic  valve. Mild calcification of the aortic valve. Aortic valve regurgitation  is trivial by color flow Doppler. No stenosis of the aortic valve.   6. The aortic root is normal in size and structure.   EKG: EKG today demonstrates NSR 68 bpm with poor R wave progression and no acute ST/T wave changes.  Recent Labs: 03/17/2020: ALT 9; BUN 12; Creatinine, Ser 0.83; Hemoglobin 11.3; Platelets 250.0; Potassium 4.0; Sodium 137; TSH 0.68   Recent Lipid Panel    Component Value Date/Time   CHOL 186 03/17/2020 1215   TRIG 97.0 03/17/2020 1215   HDL 39.60 03/17/2020 1215   CHOLHDL 5 03/17/2020 1215   VLDL 19.4 03/17/2020 1215   LDLCALC 127 (H) 03/17/2020 1215    Home Medications   Current Meds  Medication Sig  . amLODipine (NORVASC) 10 MG tablet Take 1 tablet (10 mg total) by mouth daily.  Marland Kitchen apixaban (ELIQUIS) 2.5 MG TABS tablet Take 1 tablet (2.5 mg total) by mouth 2 (two) times daily.  Marland Kitchen aspirin-acetaminophen-caffeine (EXCEDRIN MIGRAINE) 250-250-65 MG tablet Take 2 tablets by mouth every 6 (six) hours as needed for headache.   . labetalol (NORMODYNE) 200 MG tablet Take 1 tablet (200 mg total) by mouth 2 (two) times daily.  . Vitamin D, Ergocalciferol, (DRISDOL) 1.25 MG (50000 UNIT) CAPS capsule Take 1 capsule (50,000 Units total) by mouth every 7 (seven) days.    Review of Systems   All other systems reviewed and are otherwise negative except as noted above.  Physical Exam    VS:  BP (!) 154/108 (BP Location: Left Arm, Patient Position: Sitting, Cuff Size: Normal)  Pulse 68   Ht 5\' 2"  (1.575 m)   Wt 191 lb (86.6 kg)   SpO2 98%   BMI 34.93 kg/m  , BMI Body mass index is 34.93 kg/m. GEN: Well nourished, overweight, well developed, in no acute distress. HEENT: normal. Neck: Supple, no JVD, carotid bruits, or masses. Cardiac: RRR, no murmurs, rubs, or gallops. No clubbing, cyanosis, edema.  Radials/DP/PT 2+ and equal bilaterally.  Respiratory:  Respirations regular and unlabored, clear to auscultation bilaterally. GI: Soft, nontender, nondistended, BS + x 4. MS: No deformity or atrophy. Skin: Warm and dry, no rash. Neuro:  Strength and sensation are intact. Psych: Normal affect.  Assessment & Plan    1. HTN - BP elevated. Not taking Labetalol twice per day as prescribed as frequently forgets morning medications. To promote medication compliance, stop Labetalol, start Losartan 25mg  daily, continue Amlodipine  10mg  QHS. Previous intolerance to Lisinopril with nausea. Discussed importance of low salt heart healthy diet and regular cardiovascular exercise.   2. Hx of PE/Chronic anticoagulation- Unprovoked PE 12/2018. Referred to hematology for hypercoagulable work-up.  Seen by Dr. Tasia Catchings 12/12/2018.  Recommended to hold Eliquis for 3 days prior to proceeding with hypercoagulable work-up.  After that recommended to proceed with Eliquis 2.5 mg twice daily.  She has not actively do hypercoagulable work-up.  Encouraged to complete.  Continue Eliquis at reduced dose 2.5 mg twice daily per hematology recommendations.    Disposition: Follow up in 2-3 month(s) with Dr. Saunders Revel or APP   Loel Dubonnet, NP 05/01/2020, 3:45 PM

## 2020-05-01 NOTE — Patient Instructions (Addendum)
Medication Instructions:  Your physician has recommended you make the following change in your medication:  1- STOP Labetalol. 2- START Losartan 25 mg by mouth once a day.  *If you need a refill on your cardiac medications before your next appointment, please call your pharmacy*  Follow-Up: At [facility], you and your health needs are our priority.  As part of our continuing mission to provide you with exceptional heart care, we have created designated Provider Care Teams.  These Care Teams include your primary Cardiologist (physician) and Advanced Practice Providers (APPs -  Physician Assistants and Nurse Practitioners) who all work together to provide you with the care you need, when you need it.  We recommend signing up for the patient portal called "MyChart".  Sign up information is provided on this After Visit Summary.  MyChart is used to connect with patients for Virtual Visits (Telemedicine).  Patients are able to view lab/test results, encounter notes, upcoming appointments, etc.  Non-urgent messages can be sent to your provider as well.   To learn more about what you can do with MyChart, go to [email or web address].    Your next appointment:   2-3 month(s)  The format for your next appointment:   In Person  Provider:   You may see Nelva Bush, MD or one of the following Advanced Practice Providers on your designated Care Team:    Murray Hodgkins, NP  Christell Faith, PA-C  Marrianne Mood, PA-C  Cadence Kathlen Mody, [place]    Our blood pressure goal is for it to be less than 130/80  Hypertension, Adult Hypertension is another name for high blood pressure. High blood pressure forces your heart to work harder to pump blood. This can cause problems over time. There are two numbers in a blood pressure reading. There is a top number (systolic) over a bottom number (diastolic). It is best to have a blood pressure that is below 120/80. Healthy choices can help lower your blood  pressure, or you may need medicine to help lower it. What are the causes? The cause of this condition is not known. Some conditions may be related to high blood pressure. What increases the risk?  Smoking.  Having type 2 diabetes mellitus, high cholesterol, or both.  Not getting enough exercise or physical activity.  Being overweight.  Having too much fat, sugar, calories, or salt (sodium) in your diet.  Drinking too much alcohol.  Having long-term (chronic) kidney disease.  Having a family history of high blood pressure.  Age. Risk increases with age.  Race. You may be at higher risk if you are African American.  Gender. Men are at higher risk than women before age 58. After age 10, women are at higher risk than men.  Having obstructive sleep apnea.  Stress. What are the signs or symptoms?  High blood pressure may not cause symptoms. Very high blood pressure (hypertensive crisis) may cause: ? Headache. ? Feelings of worry or nervousness (anxiety). ? Shortness of breath. ? Nosebleed. ? A feeling of being sick to your stomach (nausea). ? Throwing up (vomiting). ? Changes in how you see. ? Very bad chest pain. ? Seizures. How is this treated?  This condition is treated by making healthy lifestyle changes, such as: ? Eating healthy foods. ? Exercising more. ? Drinking less alcohol.  Your health care provider may prescribe medicine if lifestyle changes are not enough to get your blood pressure under control, and if: ? Your top number is above 130. ? Your bottom  number is above 80.  Your personal target blood pressure may vary. Follow these instructions at home: Eating and drinking   If told, follow the DASH eating plan. To follow this plan: ? Fill one half of your plate at each meal with fruits and vegetables. ? Fill one fourth of your plate at each meal with whole grains. Whole grains include whole-wheat pasta, brown rice, and whole-grain bread. ? Eat or  drink low-fat dairy products, such as skim milk or low-fat yogurt. ? Fill one fourth of your plate at each meal with low-fat (lean) proteins. Low-fat proteins include fish, chicken without skin, eggs, beans, and tofu. ? Avoid fatty meat, cured and processed meat, or chicken with skin. ? Avoid pre-made or processed food.  Eat less than 1,500 mg of salt each day.  Do not drink alcohol if: ? Your doctor tells you not to drink. ? You are pregnant, may be pregnant, or are planning to become pregnant.  If you drink alcohol: ? Limit how much you use to:  0-1 drink a day for women.  0-2 drinks a day for men. ? Be aware of how much alcohol is in your drink. In the [place] one drink equals one 12 oz bottle of beer (355 mL), one 5 oz glass of wine (148 mL), or one 1 oz glass of hard liquor (44 mL). Lifestyle   Work with your doctor to stay at a healthy weight or to lose weight. Ask your doctor what the best weight is for you.  Get at least 30 minutes of exercise most days of the week. This may include walking, swimming, or biking.  Get at least 30 minutes of exercise that strengthens your muscles (resistance exercise) at least 3 days a week. This may include lifting weights or doing Pilates.  Do not use any products that contain nicotine or tobacco, such as cigarettes, e-cigarettes, and chewing tobacco. If you need help quitting, ask your doctor.  Check your blood pressure at home as told by your doctor.  Keep all follow-up visits as told by your doctor. This is important. Medicines  Take over-the-counter and prescription medicines only as told by your doctor. Follow directions carefully.  Do not skip doses of blood pressure medicine. The medicine does not work as well if you skip doses. Skipping doses also puts you at risk for problems.  Ask your doctor about side effects or reactions to medicines that you should watch for. Contact a doctor if you:  Think you are having a reaction to  the medicine you are taking.  Have headaches that keep coming back (recurring).  Feel dizzy.  Have swelling in your ankles.  Have trouble with your vision. Get help right away if you:  Get a very bad headache.  Start to feel mixed up (confused).  Feel weak or numb.  Feel faint.  Have very bad pain in your: ? Chest. ? Belly (abdomen).  Throw up more than once.  Have trouble breathing. Summary  Hypertension is another name for high blood pressure.  High blood pressure forces your heart to work harder to pump blood.  For most people, a normal blood pressure is less than 120/80.  Making healthy choices can help lower blood pressure. If your blood pressure does not get lower with healthy choices, you may need to take medicine. This information is not intended to replace advice given to you by your health care provider. Make sure you discuss any questions you have with your health  care provider. Document Revised: 02/18/2018 Document Reviewed: 02/18/2018 Elsevier Patient Education  2020 [employer].

## 2020-05-05 ENCOUNTER — Encounter: Payer: Self-pay | Admitting: Nurse Practitioner

## 2020-05-14 ENCOUNTER — Other Ambulatory Visit: Payer: Self-pay | Admitting: Internal Medicine

## 2020-05-15 ENCOUNTER — Telehealth: Payer: Self-pay | Admitting: Internal Medicine

## 2020-05-15 NOTE — Telephone Encounter (Signed)
Please increase Losartan to 50mg  QD. Continue Amlodipine 10mg  daily.   The Losartan goes all the way up to dose of 100mg  so we have room to further increase dose if needed. Would avoid previously prescribed Labetolol as the twice daily dosing was difficult for her to remember.   Please ask her to call us back with report of BP Monday so we can reassess if any further adjustments need to be made. Please check BP at least 2 ho urs after medications.   Best, Laurann Montana, NP

## 2020-05-15 NOTE — Telephone Encounter (Signed)
Pt c/o BP issue: STAT if pt c/o blurred vision, one-sided weakness or slurred speech  1. What are your last 5 BP readings?   162/92   2. Are you having any other symptoms (ex. Dizziness, headache, blurred vision, passed out)?  No   3. What is your BP issue? Trending higher wants to discuss medication and taking another med she has taken in the past

## 2020-05-15 NOTE — Telephone Encounter (Signed)
Spoke to patient. Mary Kohler, NP on 05/01/20 and started on Losartan 25 mg daily. She notified on Saturday a slight headache and pressure behind her eyes with BP 162/86.  She was concerned and took a labetalol.  BP went down to 132/80. HR is in the 80-90's usually.  Today, She felt the pressure behind her eyes and her BP was 162/92. She was at work and left.  She is concerned it is still running high. She takes her amlodipine and losartan in the evening before bed. She does not take her BP every day. She would like to know what to do to keep the BP down better. Routing to [facility] for review.

## 2020-05-16 MED ORDER — LOSARTAN POTASSIUM 25 MG PO TABS: 50.0000 mg | ORAL_TABLET | Freq: Every day | ORAL | 2 refills | Status: DC

## 2020-05-16 NOTE — Telephone Encounter (Signed)
Spoke with patient. She is aware to increase losartan to 50 mg daily. She will take 2 or her 25 mg tablets each evening and continue checking BP/HR first thing in the morning as she has been. She took another Labetalol this morning. I encouraged to to give the 50 mg losartan a few days to work and avoid labetalol if able.  She is aware to send BP readings on Monday either by MyChart or call us.  Med list updated.

## 2020-05-17 ENCOUNTER — Telehealth: Payer: Self-pay | Admitting: Internal Medicine

## 2020-05-17 ENCOUNTER — Emergency Department: Payer: BC Managed Care – PPO

## 2020-05-17 ENCOUNTER — Emergency Department
Admission: EM | Admit: 2020-05-17 | Discharge: 2020-05-17 | Disposition: A | Discharge status: Home / Self Care | Payer: BC Managed Care – PPO | Attending: Emergency Medicine | Admitting: Emergency Medicine

## 2020-05-17 DIAGNOSIS — Z7901 Long term (current) use of anticoagulants: Secondary | ICD-10-CM | POA: Diagnosis not present

## 2020-05-17 DIAGNOSIS — Z86718 Personal history of other venous thrombosis and embolism: Secondary | ICD-10-CM | POA: Diagnosis not present

## 2020-05-17 DIAGNOSIS — M79604 Pain in right leg: Secondary | ICD-10-CM

## 2020-05-17 DIAGNOSIS — M79662 Pain in left lower leg: Secondary | ICD-10-CM | POA: Diagnosis not present

## 2020-05-17 DIAGNOSIS — I10 Essential (primary) hypertension: Secondary | ICD-10-CM | POA: Insufficient documentation

## 2020-05-17 DIAGNOSIS — M79605 Pain in left leg: Secondary | ICD-10-CM | POA: Diagnosis not present

## 2020-05-17 DIAGNOSIS — Z7982 Long term (current) use of aspirin: Secondary | ICD-10-CM | POA: Insufficient documentation

## 2020-05-17 DIAGNOSIS — M79661 Pain in right lower leg: Secondary | ICD-10-CM | POA: Insufficient documentation

## 2020-05-17 DIAGNOSIS — Z79899 Other long term (current) drug therapy: Secondary | ICD-10-CM | POA: Diagnosis not present

## 2020-05-17 LAB — BASIC METABOLIC PANEL
Anion gap: 9 (ref 5–15)
BUN: 17 mg/dL (ref 6–20)
CO2: 26 mmol/L (ref 22–32)
Calcium: 9.4 mg/dL (ref 8.9–10.3)
Chloride: 102 mmol/L (ref 98–111)
Creatinine, Ser: 0.91 mg/dL (ref 0.44–1.00)
GFR, Estimated: >60 mL/min (ref >60)
Glucose, Bld: 91 mg/dL (ref 70–99)
Potassium: 3.9 mmol/L (ref 3.5–5.1)
Sodium: 137 mmol/L (ref 135–145)

## 2020-05-17 LAB — CBC
HCT: 34.3 % — ABNORMAL LOW (ref 36.0–46.0)
Hemoglobin: 11.5 g/dL — ABNORMAL LOW (ref 12.0–15.0)
MCH: 29.7 pg (ref 26.0–34.0)
MCHC: 33.5 g/dL (ref 30.0–36.0)
MCV: 88.6 fL (ref 80.0–100.0)
Platelets: 268 10*3/uL (ref 150–400)
RBC: 3.87 MIL/uL (ref 3.87–5.11)
RDW: 13.6 % (ref 11.5–15.5)
WBC: 5.6 10*3/uL (ref 4.0–10.5)
nRBC: 0 % (ref 0.0–0.2)

## 2020-05-17 LAB — CK: Total CK: 109 U/L (ref 38–234)

## 2020-05-17 NOTE — ED Triage Notes (Signed)
Pt comes via POV from home with c/o bilateral leg pain and hypertension. Pt states she has hx of clot and was placed on blood thinners.   Pt states severe leg pain that started a couple days ago. Pt states HTN and is currently on BP medication. Pt states recent changes with BP. Pt denies any dizziness, burred vision or SOB.

## 2020-05-17 NOTE — Telephone Encounter (Signed)
Spoke w pt who is currently at ED concerned with leg pain for the last 2 days - LE venous US was done and did not have a clot - pt aware to keep scheduled f/u in January - appreciative of f/u

## 2020-05-17 NOTE — ED Notes (Signed)
Pt here for HTN and bilateral leg pain.

## 2020-05-17 NOTE — Discharge Instructions (Signed)
The lab work and ultrasound of the legs that we did today were all normal.  Your blood pressure came down when she rested for a little bit.  I would ask you to return if the leg pain gets worse and follow-up with your regular doctor and the doctor regulating her blood pressure medicines this coming week.  If your blood pressure goes up above 732 or 202 systolic please feel free to return for recheck.

## 2020-05-17 NOTE — Telephone Encounter (Signed)
Pt c/o medication issue:  1. Name of Medication: Eliquis   2. How are you currently taking this medication (dosage and times per day)? 2.5 mg po BID  3. Are you having a reaction (difficulty breathing--STAT)? Not sure if taking enough   4. What is your medication issue? Patient recent decrease in eliquis  Hx blood clot in lung now with weakness in legs x 2 days and patient is not sure she is having another clotting issue

## 2020-05-17 NOTE — ED Notes (Signed)
RN Opal Sidles informed of pt in room and placed on monitor at this time.

## 2020-05-17 NOTE — ED Provider Notes (Signed)
[facility] Emergency Department Provider Note   ____________________________________________   First MD Initiated Contact with Patient 05/17/20 [phone]     (approximate)  I have reviewed the triage vital signs and the nursing notes.   HISTORY  Chief Complaint Leg Pain and Hypertension    HPI Mary Roberts is a 39 y.o. female patient reports bilateral calf pain and her blood pressure being up.  She was thought to have had a PE and was placed on blood thinners.  She is not having any chest pain or shortness of breath chest just the calf pain bilaterally.  She is physically active but does not think she was doing a whole lot of extra activity.  [facility] doctor [facility] the [facility] is in the process of adjusting her blood pressure medications.         Past Medical History:  Diagnosis Date  . Anemia   . Anxiety    panic attacks  . Carpal tunnel syndrome, bilateral   . Carpal tunnel syndrome, bilateral   . Chlamydia   . Epilepsy seizure, nonconvulsive, generalized ([place])   . Headache(784.0)    migraines  . History of C-section   . Hypertension   . Hypertensive heart disease   . Infection    UTI  . LVH (left ventricular hypertrophy)    a. 12/2018 Echo: EF 60-65%, sev inc LV wall thickness. Diast dysfxn. Triv AI.  Marland Kitchen Menorrhagia   . Migraines   . Pregnancy induced hypertension    with first  . Pulmonary embolism ([place])    a. 12/2018 CTA Chest (pleuritic c/p): probable small, nonocclusive PE w/in subsegmental RUL branch vessel. Artifact noted as well.  Eliquis initiatied.  . Seizures ([place])    No meds, off since in mid to late 20's  . UTI (urinary tract infection)     Patient Active Problem List   Diagnosis Date Noted  . History of pulmonary embolism 11/20/2019  . Status post laparoscopic hysterectomy 06/29/2019  . Anemia 03/03/2019  . Acute pulmonary embolism without acute cor pulmonale ([place]) 01/14/2019  . Hypertensive urgency 01/07/2019  .  Menorrhagia with regular cycle 07/15/2018  . Anemia associated with acute blood loss 07/15/2018  . Uncontrolled hypertension 02/01/2018  . Anemia due to chronic blood loss 07/14/2016  . Bilateral pneumonia 07/14/2016  . HTN (hypertension) 07/14/2016  . Acute hypokalemia 07/14/2016  . Heavy menstrual bleeding 07/14/2016  . Intertrochanteric fracture of left femur ([place]) 07/14/2016  . Carpal tunnel syndrome, bilateral   . Pre-eclampsia, delivered 10/29/2013  . History of seizures/last episode >15 yrs ago 10/28/2013  . History of Severe pre-eclampsia 10/28/2013  . Birth control 05/10/2011  . Encounter for Depo-Provera contraception 05/10/2011  . Acute upper respiratory infection 09/24/2010  . Sprain and strain of other specified sites of shoulder and upper arm 09/24/2010  . Iron deficiency anemia secondary to inadequate dietary iron intake 10/09/2009  . Genital herpes 10/06/2009  . Herpes simplex virus (HSV) infection 10/06/2009  . Epilepsy ([place]) 07/28/2009    Past Surgical History:  Procedure Laterality Date  . ABDOMINAL HYSTERECTOMY    . CESAREAN SECTION    . CYSTOSCOPY N/A 06/29/2019   Procedure: CYSTOSCOPY;  Surgeon: Will Bonnet, MD;  Location: [facility] ORS;  Service: Gynecology;  Laterality: N/A;  . MOUTH SURGERY    . TOTAL LAPAROSCOPIC HYSTERECTOMY WITH SALPINGECTOMY N/A 06/29/2019   Procedure: TOTAL LAPAROSCOPIC HYSTERECTOMY WITH SALPINGECTOMY;  Surgeon: Will Bonnet, MD;  Location: [facility] ORS;  Service: Gynecology;  Laterality: N/A;  . TUBAL LIGATION N/A 10/30/2013   Procedure: POST PARTUM TUBAL LIGATION;  Surgeon: [name], MD;  Location: [facility];  Service: Gynecology;  Laterality: N/A;    Prior to Admission medications   Medication Sig Start Date End Date Taking? Authorizing Provider  amLODipine (NORVASC) 10 MG tablet Take 1 tablet (10 mg total) by mouth daily. 05/01/20   Loel Dubonnet, NP  apixaban (ELIQUIS) 2.5 MG TABS tablet Take 1 tablet (2.5 mg total) by  mouth 2 (two) times daily. 03/17/20   Loel Dubonnet, NP  aspirin-acetaminophen-caffeine (EXCEDRIN MIGRAINE) [phone] MG tablet Take 2 tablets by mouth every 6 (six) hours as needed for headache.     [provider]  losartan (COZAAR) 25 MG tablet Take 2 tablets (50 mg total) by mouth daily. 05/16/20 08/14/20  Loel Dubonnet, NP  Vitamin D, Ergocalciferol, (DRISDOL) 1.25 MG (50000 UNIT) CAPS capsule Take 1 capsule (50,000 Units total) by mouth every 7 (seven) days. 03/19/20   Marval Regal, NP    Allergies Amoxicillin and Lisinopril  Family History  Problem Relation Age of Onset  . Seizures Mother   . Hypertension Mother   . Diabetes Mother   . Heart disease Mother   . Stroke Mother   . Arthritis Mother   . Seizures Brother   . Hearing loss Neg Hx     Social History Social History   Tobacco Use  . Smoking status: Never Smoker  . Smokeless tobacco: Never Used  . Tobacco comment: has smoked 1 pack in 1 week her life  Vaping Use  . Vaping Use: Never used  Substance Use Topics  . Alcohol use: Yes    Comment: socially a few drinks a week or two  . Drug use: No    Review of Systems  Constitutional: No fever/chills Eyes: No visual changes. ENT: No sore throat. Cardiovascular: Denies chest pain. Respiratory: Denies shortness of breath. Gastrointestinal: No abdominal pain.  No nausea, no vomiting.  No diarrhea.  No constipation. Genitourinary: Negative for dysuria. Musculoskeletal: Negative for back pain. Skin: Negative for rash. Neurological: Negative for headaches, focal weakness   ____________________________________________   PHYSICAL EXAM:  VITAL SIGNS: ED Triage Vitals  Enc Vitals Group     BP 05/17/20 1342 (!) 147/88     Pulse Rate 05/17/20 1342 77     Resp 05/17/20 1530 17     Temp 05/17/20 1342 98.7 F (37.1 C)     Temp Source 05/17/20 1342 Oral     SpO2 05/17/20 1342 100 %     Weight --      Height --      Head Circumference --       Peak Flow --      Pain Score --      Pain Loc --      Pain Edu? --      Excl. in [place]? --     Constitutional: Alert and oriented. Well appearing and in no acute distress. Eyes: Conjunctivae are normal.  Head: Atraumatic. Nose: No congestion/rhinnorhea. Mouth/Throat: Mucous membranes are moist.   Neck: No stridor.  Cardiovascular: Kermit Balo peripheral circulation. Respiratory: Normal respiratory effort.  No retractions.  Gastrointestinal:  No distention. Musculoskeletal: Bilateral calf tenderness.  No tenderness in the upper legs.  Normal capillary refill on leg warmth. Neurologic:  Normal speech and language. No gross focal neurologic deficits are appreciated. Skin:  Skin is warm, dry and intact. No rash noted.  ____________________________________________   LABS (all labs ordered are listed, but only abnormal results are displayed)  Labs Reviewed  CBC - Abnormal; Notable for the following components:      Result Value   Hemoglobin 11.5 (*)    HCT 34.3 (*)    All other components within normal limits  BASIC METABOLIC PANEL  CK   ____________________________________________  EKG  ____________________________________________  RADIOLOGY Gertha Calkin, personally viewed and evaluated these images (plain radiographs) as part of my medical decision making, as well as reviewing the written report by the radiologist.  ED MD interpretation: Ultrasound of bilateral lower legs was negative for DVT.  Official radiology report(s): US Venous Img Lower Bilateral  Result Date: 05/17/2020 CLINICAL DATA:  Bilateral leg pain. History of deep venous thrombosis. EXAM: BILATERAL LOWER EXTREMITY VENOUS DOPPLER ULTRASOUND TECHNIQUE: Gray-scale sonography with graded compression, as well as color Doppler and duplex ultrasound were performed to evaluate the lower extremity deep venous systems from the level of the common femoral vein and including the common femoral, femoral, profunda  femoral, popliteal and calf veins including the posterior tibial, peroneal and gastrocnemius veins when visible. The superficial great saphenous vein was also interrogated. Spectral Doppler was utilized to evaluate flow at rest and with distal augmentation maneuvers in the common femoral, femoral and popliteal veins. COMPARISON:  01/08/2019 FINDINGS: RIGHT LOWER EXTREMITY Common Femoral Vein: No evidence of thrombus. Normal compressibility, respiratory phasicity and response to augmentation. Saphenofemoral Junction: No evidence of thrombus. Normal compressibility and flow on color Doppler imaging. Profunda Femoral Vein: No evidence of thrombus. Normal compressibility and flow on color Doppler imaging. Femoral Vein: No evidence of thrombus. Normal compressibility, respiratory phasicity and response to augmentation. Popliteal Vein: No evidence of thrombus. Normal compressibility, respiratory phasicity and response to augmentation. Calf Veins: No evidence of thrombus. Normal compressibility and flow on color Doppler imaging. Superficial Great Saphenous Vein: No evidence of thrombus. Normal compressibility. Venous Reflux:  None. Other Findings:  None. LEFT LOWER EXTREMITY Common Femoral Vein: No evidence of thrombus. Normal compressibility, respiratory phasicity and response to augmentation. Saphenofemoral Junction: No evidence of thrombus. Normal compressibility and flow on color Doppler imaging. Profunda Femoral Vein: No evidence of thrombus. Normal compressibility and flow on color Doppler imaging. Femoral Vein: No evidence of thrombus. Normal compressibility, respiratory phasicity and response to augmentation. Popliteal Vein: No evidence of thrombus. Normal compressibility, respiratory phasicity and response to augmentation. Calf Veins: No evidence of thrombus. Normal compressibility and flow on color Doppler imaging. Superficial Great Saphenous Vein: No evidence of thrombus. Normal compressibility. Venous Reflux:   None. Other Findings:  None. IMPRESSION: No evidence of deep venous thrombosis in either lower extremity. Electronically Signed   By: Nelson Chimes M.D.   On: 05/17/2020 15:30    ____________________________________________   PROCEDURES  Procedure(s) performed (including Critical Care):  Procedures   ____________________________________________   INITIAL IMPRESSION / ASSESSMENT AND PLAN / ED COURSE  Patient is not having any chest pain or shortness of breath.  Her calf pain is bilateral in the muscles but the ultrasound is negative.  Her lab work including CK and white count and renal function is all normal.  Patient's blood pressure goes down below 140/90 when she rest goes up a little bit higher when she is up and moving.  She is having her blood pressure medications adjusted currently.  Her blood pressure is not dangerously high here.  I have advised her to follow-up with [facility] doctor [facility] who  is adjusting her medications as well as with her primary care doctor.  She can return at anytime if she has any worsening.              ____________________________________________   FINAL CLINICAL IMPRESSION(S) / ED DIAGNOSES  Final diagnoses:  Hypertension, unspecified type  Pain in both lower extremities     ED Discharge Orders    None      *Please note:  Mary Roberts was evaluated in Emergency Department on 05/17/2020 for the symptoms described in the history of present illness. She was evaluated in the context of the global COVID-19 pandemic, which necessitated consideration that the patient might be at risk for infection with the SARS-CoV-2 virus that causes COVID-19. Institutional protocols and algorithms that pertain to the evaluation of patients at risk for COVID-19 are in a state of rapid change based on information released by regulatory bodies including the CDC and federal and state organizations. These policies and algorithms were followed  during the patient's care in the ED.  Some ED evaluations and interventions may be delayed as a result of limited staffing during and the pandemic.*   Note:  This document was prepared using Dragon voice recognition software and may include unintentional dictation errors.    Nena Polio, MD 05/17/20 [phone]

## 2020-06-26 ENCOUNTER — Ambulatory Visit: Payer: BC Managed Care – PPO | Admitting: Nurse Practitioner

## 2020-06-30 IMAGING — US VENOUS DOPPLER ULTRASOUND OF  LOWER EXTREMITIES
1 series · 13 of 24 positions shown · non-contrast
Comparison: Chest CTA-01/07/2019

CLINICAL DATA: Recent diagnosis of pulmonary embolism. Evaluate for
DVT.



[Series 1: venous doppler ultrasound of lower extremities · 13 of 67 slices shown]
[im 1/67]
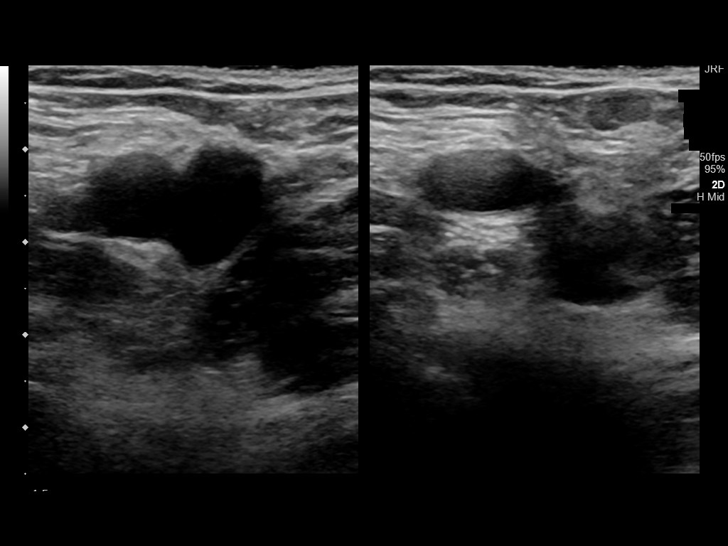
[im 6/67]
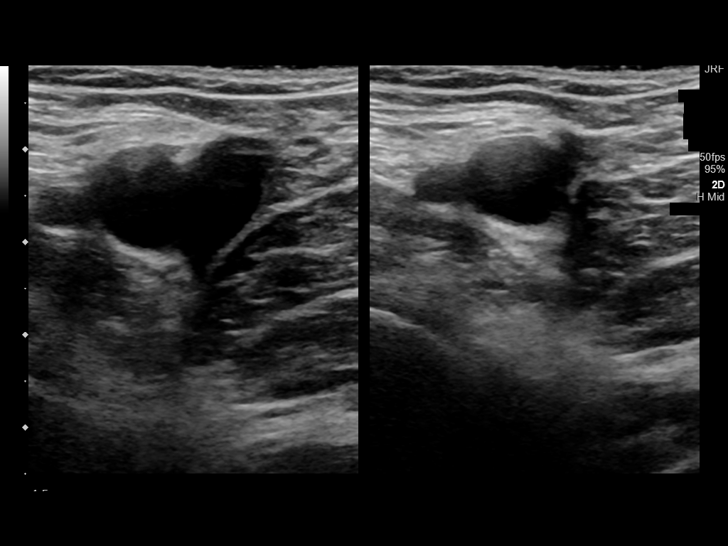
[im 12/67]
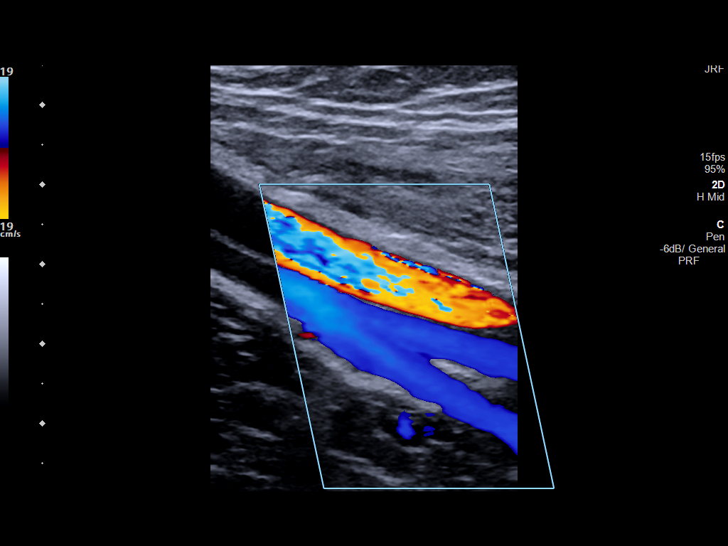
[im 18/67]
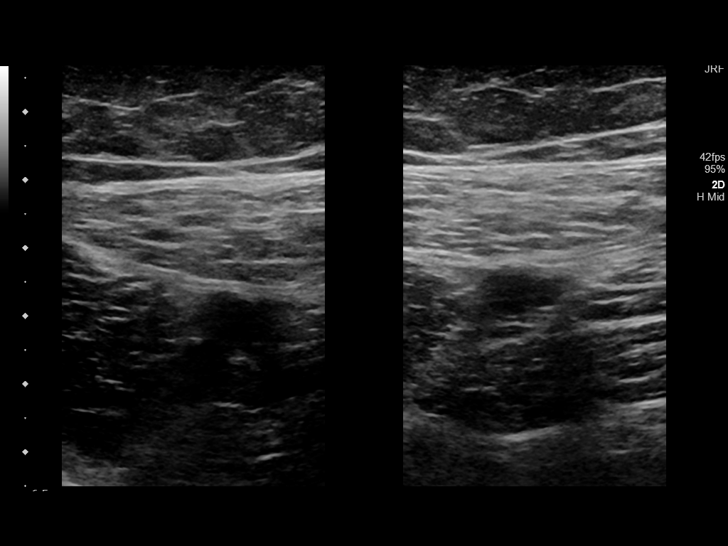
[im 23/67]
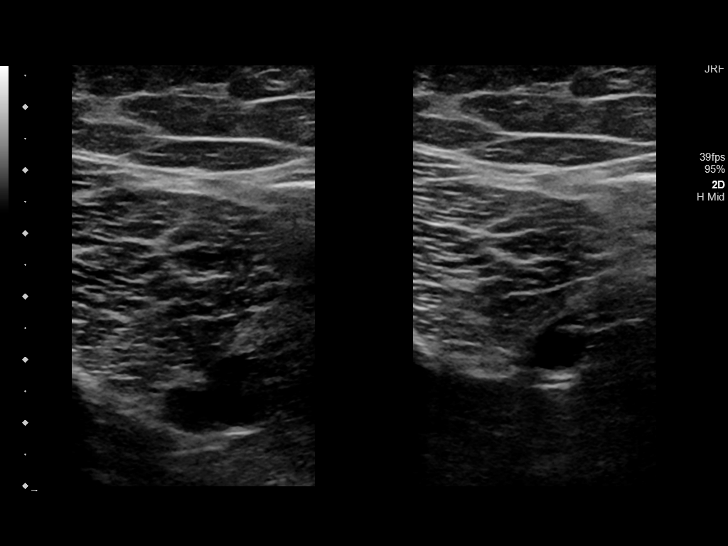
[im 29/67]
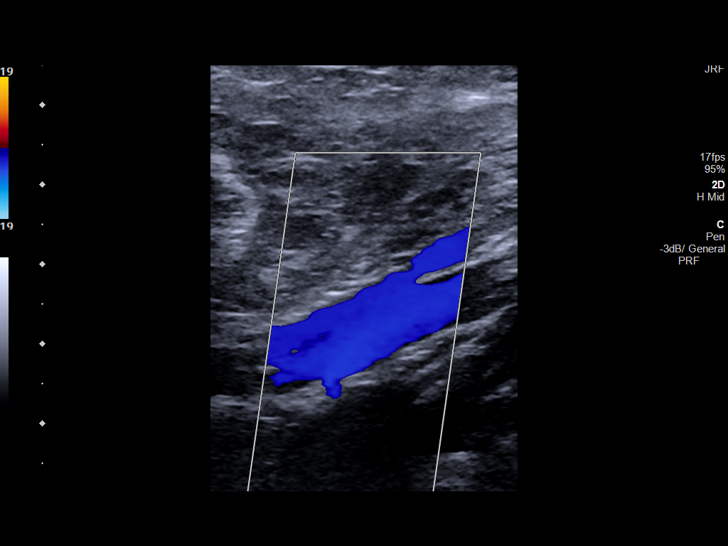
[im 35/67]
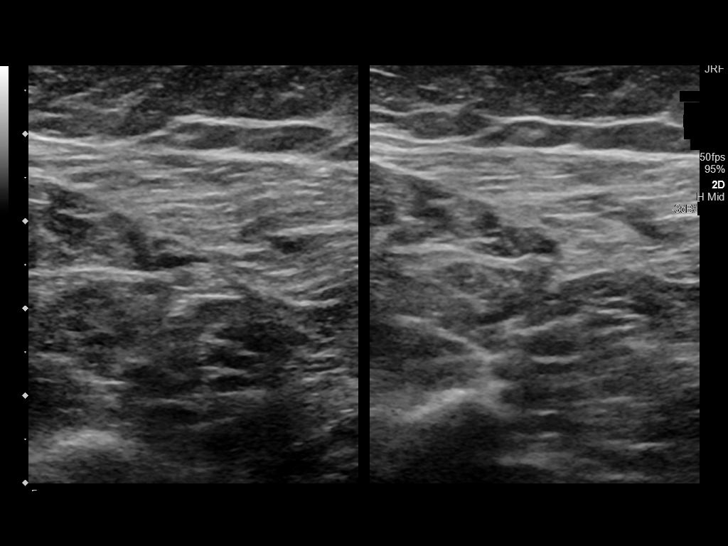
[im 38/67]
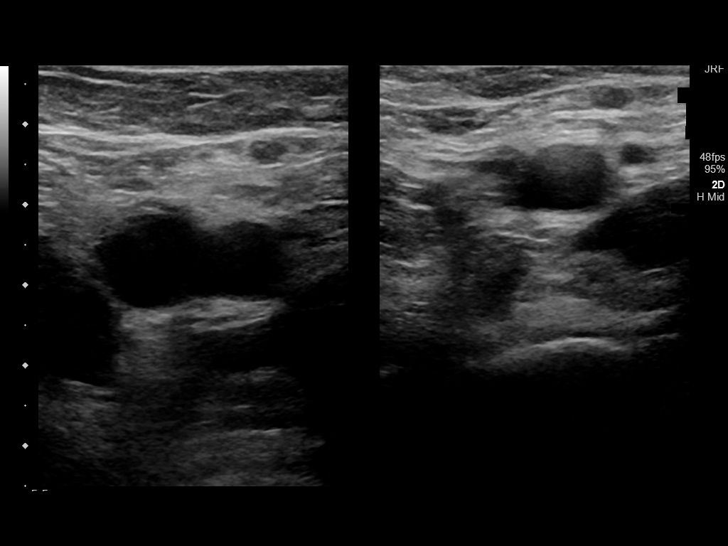
[im 44/67]
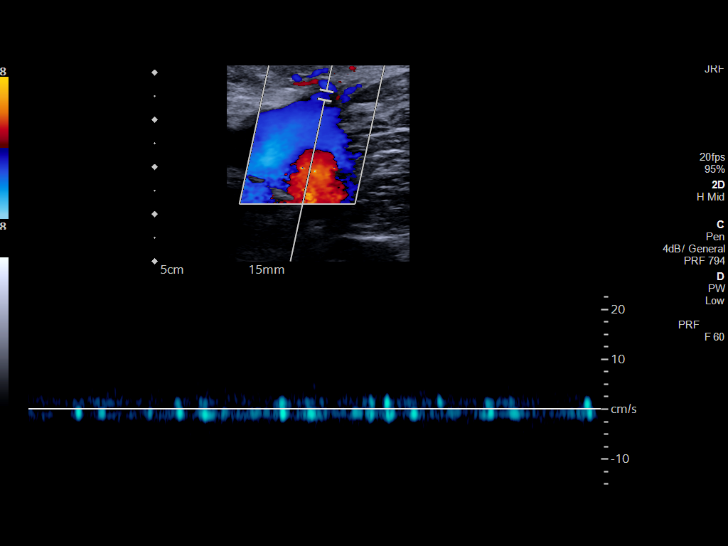
[im 49/67]
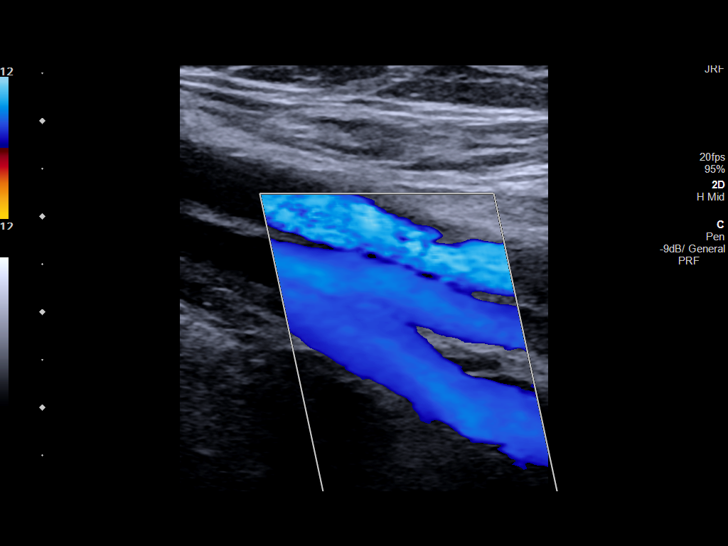
[im 55/67]
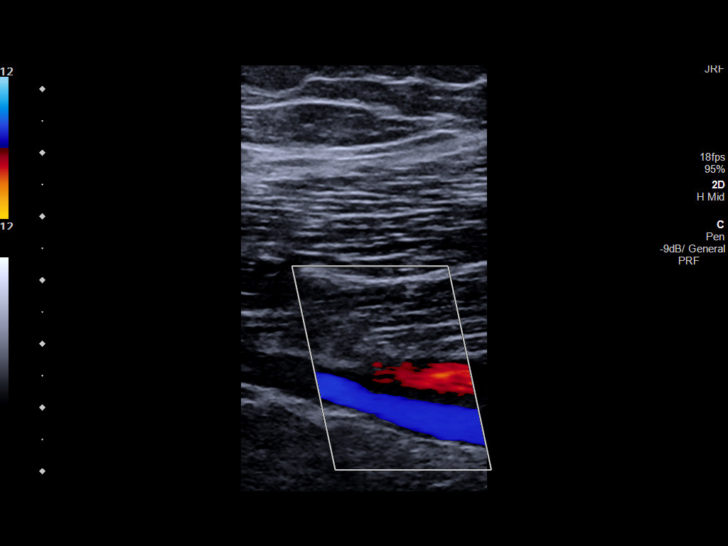
[im 61/67]
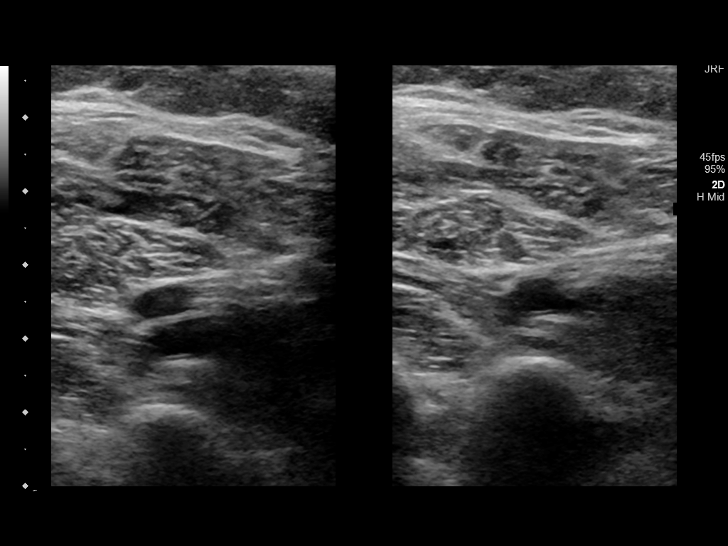
[im 67/67]
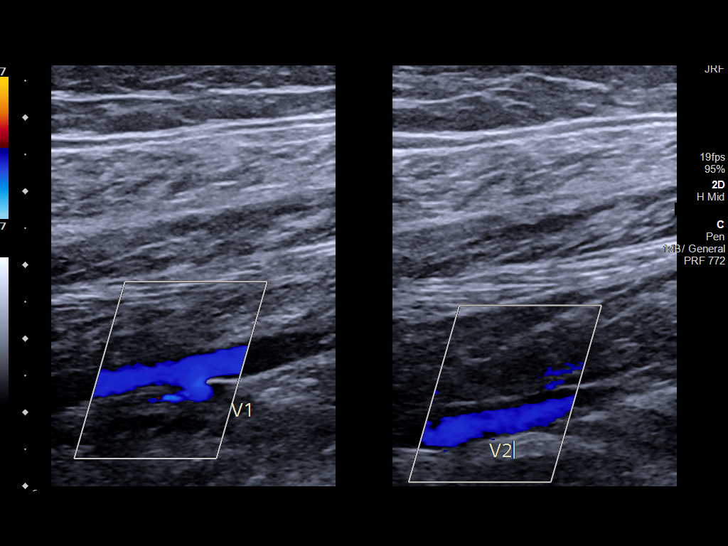

[13 of 24 positions shown; findings below may reference images not displayed]

FINDINGS: RIGHT LOWER EXTREMITY

Common Femoral Vein: No evidence of thrombus. Normal
compressibility, respiratory phasicity and response to augmentation.

Saphenofemoral Junction: No evidence of thrombus. Normal
compressibility and flow on color Doppler imaging.

Profunda Femoral Vein: No evidence of thrombus. Normal
compressibility and flow on color Doppler imaging.

Femoral Vein: No evidence of thrombus. Normal compressibility,
respiratory phasicity and response to augmentation.

Popliteal Vein: No evidence of thrombus. Normal compressibility,
respiratory phasicity and response to augmentation.

Calf Veins: No evidence of thrombus. Normal compressibility and flow
on color Doppler imaging.

Superficial Great Saphenous Vein: No evidence of thrombus. Normal
compressibility.

Venous Reflux:  None.

Other Findings:  None.

LEFT LOWER EXTREMITY

Common Femoral Vein: No evidence of thrombus. Normal
compressibility, respiratory phasicity and response to augmentation.

Saphenofemoral Junction: No evidence of thrombus. Normal
compressibility and flow on color Doppler imaging.

Profunda Femoral Vein: No evidence of thrombus. Normal
compressibility and flow on color Doppler imaging.

Femoral Vein: No evidence of thrombus. Normal compressibility,
respiratory phasicity and response to augmentation.

Popliteal Vein: No evidence of thrombus. Normal compressibility,
respiratory phasicity and response to augmentation.

Calf Veins: No evidence of thrombus. Normal compressibility and flow
on color Doppler imaging.

Superficial Great Saphenous Vein: No evidence of thrombus. Normal
compressibility.

Venous Reflux:  None.

Other Findings:  None.
IMPRESSION: No evidence of DVT within either lower extremity.

## 2020-07-19 ENCOUNTER — Ambulatory Visit: Payer: BC Managed Care – PPO | Admitting: Family

## 2020-07-31 ENCOUNTER — Ambulatory Visit (INDEPENDENT_AMBULATORY_CARE_PROVIDER_SITE_OTHER): Payer: 59 | Admitting: Family

## 2020-07-31 ENCOUNTER — Other Ambulatory Visit: Payer: Self-pay

## 2020-07-31 ENCOUNTER — Encounter: Payer: Self-pay | Admitting: Family

## 2020-07-31 VITALS — BP 138/92 | HR 81 | Ht 62.0 in | Wt 196.1 lb

## 2020-07-31 DIAGNOSIS — Z86711 Personal history of pulmonary embolism: Secondary | ICD-10-CM | POA: Diagnosis not present

## 2020-07-31 DIAGNOSIS — I10 Essential (primary) hypertension: Secondary | ICD-10-CM | POA: Diagnosis not present

## 2020-07-31 DIAGNOSIS — Z7901 Long term (current) use of anticoagulants: Secondary | ICD-10-CM | POA: Diagnosis not present

## 2020-07-31 MED ORDER — LOSARTAN POTASSIUM 50 MG PO TABS: 75.0000 mg | ORAL_TABLET | Freq: Every day | ORAL | 1 refills | Status: DC

## 2020-07-31 NOTE — Patient Instructions (Addendum)
Medication Instructions:  Your physician has recommended you make the following change in your medication:   CHANGE Losartan to 75mg  daily *We have sent in the Losartan 50mg  tablets. You will take 1.5 tablets daily*  CONTINUE Amlodipine 10mg  daily  *If you need a refill on your cardiac medications before your next appointment, please call your pharmacy*  Lab Work: No lab work today  Testing/Procedures: Your EKG today shows normal sinus rhythm.   Follow-Up: At [facility], you and your health needs are our priority.  As part of our continuing mission to provide you with exceptional heart care, we have created designated Provider Care Teams.  These Care Teams include your primary Cardiologist (physician) and Advanced Practice Providers (APPs -  Physician Assistants and Nurse Practitioners) who all work together to provide you with the care you need, when you need it.  We recommend signing up for the patient portal called "MyChart".  Sign up information is provided on this After Visit Summary.  MyChart is used to connect with patients for Virtual Visits (Telemedicine).  Patients are able to view lab/test results, encounter notes, upcoming appointments, etc.  Non-urgent messages can be sent to your provider as well.   To learn more about what you can do with MyChart, go to [email or web address].    Your next appointment:   6 month(s)  The format for your next appointment:   In Person  Provider:   You may see Nelva Bush, MD or one of the following Advanced Practice Providers on your designated Care Team:    Murray Hodgkins, NP  Christell Faith, PA-C  Marrianne Mood, PA-C  Cadence Lostine, [place]  Laurann Montana, NP   Other Instructions  Recommend calling Dr. Tasia Catchings about your hypercoagulable workup. Their phone number is [phone]. This will let you know whether you are at risk for repeated blood clots. You would have to stop your Eliquis 3 days prior to the blood work.    Heart Healthy Diet Recommendations: A low-salt diet is recommended. Meats should be grilled, baked, or boiled. Avoid fried foods. Focus on lean protein sources like fish or chicken with vegetables and fruits. The American Heart Association is a Microbiologist!  American Heart Association Diet and Lifeystyle Recommendations   Exercise recommendations: The American Heart Association recommends 150 minutes of moderate intensity exercise weekly. Try 30 minutes of moderate intensity exercise 4-5 times per week. This could include walking, jogging, or swimming.  Tips to Measure your Blood Pressure Correctly  To determine whether you have hypertension, a medical professional will take a blood pressure reading. How you prepare for the test, the position of your arm, and other factors can change a blood pressure reading by 10% or more. That could be enough to hide high blood pressure, start you on a drug you don't really need, or lead your doctor to incorrectly adjust your medications.  Here's what you can do to ensure a correct reading: . Don't drink a caffeinated beverage or smoke during the 30 minutes before the test. . Sit quietly for five minutes before the test begins. . During the measurement, sit in a chair with your feet on the floor and your arm supported so your elbow is at about heart level. . The inflatable part of the cuff should completely cover at least 80% of your upper arm, and the cuff should be placed on bare skin, not over a shirt. . Don't talk during the measurement. . Have your blood pressure measured twice, with a brief  break in between. If the readings are different by 5 points or more, have it done a third time.  There are times to break these rules. If you sometimes feel lightheaded when getting out of bed in the morning or when you stand after sitting, you should have your blood pressure checked while seated and then while standing to see if it falls from one position to  the next.  Because blood pressure varies throughout the day, your doctor will rarely diagnose hypertension on the basis of a single reading. Instead, he or she will want to confirm the measurements on at least two occasions, usually within a few weeks of one another. The exception to this rule is if you have a blood pressure reading of 180/110 mm Hg or higher. A result this high usually calls for prompt treatment.  It's a good idea to have your blood pressure measured in both arms at least once, since the reading in one arm (usually the right) may be higher than that in the left. A 2014 study in The American Journal of Medicine of nearly 3,400 people found average arm- to-arm differences in systolic blood pressure of about 5 points. The higher number should be used to make treatment decisions.  In 2017, new guidelines from the [place], the [employer] of Cardiology, and nine other health organizations lowered the diagnosis of high blood pressure to 130/80 mm Hg or higher for all adults. The guidelines also redefined the various blood pressure categories to now include normal, elevated, Stage 1 hypertension, Stage 2 hypertension, and hypertensive crisis (see "Blood pressure categories").  Blood pressure categories  Blood pressure category SYSTOLIC (upper number)  DIASTOLIC (lower number)  Normal Less than 120 mm Hg and Less than 80 mm Hg  Elevated 120-129 mm Hg and Less than 80 mm Hg  High blood pressure: Stage 1 hypertension 130-139 mm Hg or 80-89 mm Hg  High blood pressure: Stage 2 hypertension 140 mm Hg or higher or 90 mm Hg or higher  Hypertensive crisis (consult your doctor immediately) Higher than 180 mm Hg and/or Higher than 120 mm Hg  Source: American Heart Association and American Stroke Association. For more on getting your blood pressure under control, buy Controlling Your Blood Pressure, a Special Health Report from [facility]   Blood Pressure  Log   Date   Time  Blood Pressure  Position  Example: Nov 1 9 AM 124/78 sitting

## 2020-07-31 NOTE — Progress Notes (Signed)
Office Visit    Patient Name: Mary Roberts Date of Encounter: 07/31/2020  Primary Care Provider:  [facility], Ander Gaster, MD Primary Cardiologist:  Nelva Bush, MD Electrophysiologist:  None   Chief Complaint    Mary Roberts is a 40 y.o. female with a hx of PE, HTN, preeclampsia, seizure disorder presents today for follow up of hypertension.   Past Medical History    Past Medical History:  Diagnosis Date  . Anemia   . Anxiety    panic attacks  . Carpal tunnel syndrome, bilateral   . Carpal tunnel syndrome, bilateral   . Chlamydia   . Epilepsy seizure, nonconvulsive, generalized ([place])   . Headache(784.0)    migraines  . History of C-section   . Hypertension   . Hypertensive heart disease   . Infection    UTI  . LVH (left ventricular hypertrophy)    a. 12/2018 Echo: EF 60-65%, sev inc LV wall thickness. Diast dysfxn. Triv AI.  Marland Kitchen Menorrhagia   . Migraines   . Pregnancy induced hypertension    with first  . Pulmonary embolism ([place])    a. 12/2018 CTA Chest (pleuritic c/p): probable small, nonocclusive PE w/in subsegmental RUL branch vessel. Artifact noted as well.  Eliquis initiatied.  . Seizures ([place])    No meds, off since in mid to late 20's  . UTI (urinary tract infection)    Past Surgical History:  Procedure Laterality Date  . ABDOMINAL HYSTERECTOMY    . CESAREAN SECTION    . CYSTOSCOPY N/A 06/29/2019   Procedure: CYSTOSCOPY;  Surgeon: Will Bonnet, MD;  Location: [facility];  Service: Gynecology;  Laterality: N/A;  . MOUTH SURGERY    . TOTAL LAPAROSCOPIC HYSTERECTOMY WITH SALPINGECTOMY N/A 06/29/2019   Procedure: TOTAL LAPAROSCOPIC HYSTERECTOMY WITH SALPINGECTOMY;  Surgeon: Will Bonnet, MD;  Location: [facility];  Service: Gynecology;  Laterality: N/A;  . TUBAL LIGATION N/A 10/30/2013   Procedure: POST PARTUM TUBAL LIGATION;  Surgeon: [name], MD;  Location: [place] [facility];  Service: Gynecology;  Laterality: N/A;    Allergies  Allergies  Allergen  Reactions  . Amoxicillin Hives and Other (See Comments)    Has patient had a PCN reaction causing immediate rash, facial/tongue/throat swelling, SOB or lightheadedness with hypotension: No Has patient had a PCN reaction causing severe rash involving mucus membranes or skin necrosis: No Has patient had a PCN reaction that required hospitalization: No Has patient had a PCN reaction occurring within the last 10 years: Yes If all of the above answers are "NO", then may proceed with Cephalosporin use.   Marland Kitchen Lisinopril Nausea Only    History of Present Illness    Mary Roberts is a 40 y.o. female with a hx of PE, HTN, preeclampsia, seizure disorder last seen 05/01/20.  July 2020 she was seen in the ED with uncontrolled hypertension, chest pain, dyspnea.  CT angiography with probable nonocclusive PE within segmental right upper lobe branch vessel.  Lower extremity duplex negative for DVT.  She was admitted for blood pressure management and anticoagulation.  Echo with normal LVEF, diastolic dysfunction, severe LVH.  Seen in late July by Dr. Saunders Revel and medications consolidated.  Follow-up 02/25/2027 blood pressure was stable.  Her hydralazine was reduced to 25 mg twice daily.  Unfortunately, in setting of oral anticoagulation she was admitted 03/03/2019 with abnormal uterine bleeding and severe anemia with a hemoglobin of 6.5 requiring 2 units of PRBC.  She ultimately underwent total laparoscopic hysterectomy with bilateral  salpingectomy 06/2019.  Seen by Dr. Saunders Revel in follow-up 11/19/2019.  In effort to continue consolidating her antihypertensive regimen chlorthalidone was discontinued.  She was also referred to hematology for discussion of hypercoagulable work-up in setting of unprovoked PE.  Her labetalol, hydralazine, amlodipine were continued.  She saw Dr. Tasia Catchings of oncology 12/07/19. She was recommended to hold Eliquis for 3 days and undergo hypercoagulable workup then change Eliquis to 2.5mg  twice daily. She has not  yet completed this blood work.   Seen in follow up 03/17/20. Reported home BP 120s-130s/80s when on antihypertensive medications though had run out of multiple.  She was interested in continuing to consolidate her antihypertensive regimen. Previous intolerance of HCTZ and Lisinopril. She was recommended to stop Labetolol, start Losartan 25mg  daily, and continue Amlodipine 10mg  daily. Her Losartan was increased to 50mg  daily.   Presents today for follow up. Tells me her home blood pressure readings at home have been in the "yellow" range on her home cuff though she does not recall exact numbers. Endorses compliance with her antihypertensive agents and notes that the once daily administration is much easier for her. Reports no shortness of breath nor dyspnea on exertion. Reports no chest pain, pressure, or tightness. No edema, orthopnea, PND. Reports no palpitations. Reports an occasional headache over her left eye which she associates with elevated blood pressure.   Endorse eating heart healthy diet. Stays very busy working and with her children who are 109, 23, 57 years old. No formal exercise routine.   EKGs/Labs/Other Studies Reviewed:   The following studies were reviewed today:  Echo 12/2018 1. The left ventricle has normal systolic function with an ejection  fraction of 60-65%. The cavity size was normal. There is severely  increased left ventricular wall thickness. Left ventricular diastolic  Doppler parameters are consistent with impaired  relaxation. Elevated left ventricular end-diastolic pressure No evidence  of left ventricular regional wall motion abnormalities.   2. The right ventricle has normal systolic function. The cavity was  normal. There is no increase in right ventricular wall thickness.   3. The mitral valve is grossly normal.   4. The tricuspid valve is grossly normal.   5. The aortic valve is grossly normal. Mild thickening of the aortic  valve. Mild calcification of the  aortic valve. Aortic valve regurgitation  is trivial by color flow Doppler. No stenosis of the aortic valve.   6. The aortic root is normal in size and structure.   EKG: EKG today demonstrates NSR 81 bpm with poor R wave progression and no acute ST/T wave changes.  Recent Labs: 03/17/2020: ALT 9; TSH 0.68 05/17/2020: BUN 17; Creatinine, Ser 0.91; Hemoglobin 11.5; Platelets 268; Potassium 3.9; Sodium 137  Recent Lipid Panel    Component Value Date/Time   CHOL 186 03/17/2020 1215   TRIG 97.0 03/17/2020 1215   HDL 39.60 03/17/2020 1215   CHOLHDL 5 03/17/2020 1215   VLDL 19.4 03/17/2020 1215   LDLCALC 127 (H) 03/17/2020 1215    Home Medications   No outpatient medications have been marked as taking for the 07/31/20 encounter (Appointment) with Loel Dubonnet, NP.    Review of Systems    All other systems reviewed and are otherwise negative except as noted above.  Physical Exam    VS:  There were no vitals taken for this visit. , BMI There is no height or weight on file to calculate BMI. GEN: Well nourished, overweight, well developed, in no acute distress. HEENT: normal. Neck:  Supple, no JVD, carotid bruits, or masses. Cardiac: RRR, no murmurs, rubs, or gallops. No clubbing, cyanosis, edema.  Radials/DP/PT 2+ and equal bilaterally.  Respiratory:  Respirations regular and unlabored, clear to auscultation bilaterally. GI: Soft, nontender, nondistended, BS + x 4. MS: No deformity or atrophy. Skin: Warm and dry, no rash. Neuro:  Strength and sensation are intact. Psych: Normal affect.  Assessment & Plan    1. HTN - BP elevated though improved compared to previous. Increase Losartan from 50mg  to 75mg  once daily. Continue Amlodipine 10mg  daily. Previous intolerance to Lisinopril with nausea. Discussed importance of low salt heart healthy diet and regular cardiovascular exercise.   2. Hx of PE/Chronic anticoagulation- Unprovoked PE 12/2018. Referred to hematology for hypercoagulable  work-up.  Seen by Dr. Tasia Catchings 12/12/2018.  Recommended to hold Eliquis for 3 days prior to proceeding with hypercoagulable work-up. She has not yet completed this workup, encouraged to follow up. She will continue Eliquis at reduced dose 2.5 mg twice daily per hematology recommendations.  Denies bleeding complications.  Disposition: Follow up in 6 months with Dr. Saunders Revel or APP   Loel Dubonnet, NP 07/31/2020, 9:22 AM

## 2021-01-31 ENCOUNTER — Ambulatory Visit: Payer: 59 | Admitting: Internal Medicine

## 2021-02-02 ENCOUNTER — Other Ambulatory Visit: Payer: Self-pay | Admitting: Family

## 2021-02-02 DIAGNOSIS — I10 Essential (primary) hypertension: Secondary | ICD-10-CM

## 2021-02-02 NOTE — Telephone Encounter (Signed)
Rx(s) sent to pharmacy electronically.  

## 2021-03-16 ENCOUNTER — Other Ambulatory Visit: Payer: Self-pay | Admitting: Internal Medicine

## 2021-03-16 DIAGNOSIS — I10 Essential (primary) hypertension: Secondary | ICD-10-CM

## 2021-03-16 DIAGNOSIS — Z86711 Personal history of pulmonary embolism: Secondary | ICD-10-CM

## 2021-03-16 DIAGNOSIS — Z7901 Long term (current) use of anticoagulants: Secondary | ICD-10-CM

## 2021-03-16 MED ORDER — APIXABAN 2.5 MG PO TABS: 2.5000 mg | ORAL_TABLET | Freq: Two times a day (BID) | ORAL | 1 refills | Status: DC

## 2021-03-16 MED ORDER — LOSARTAN POTASSIUM 50 MG PO TABS: 75.0000 mg | ORAL_TABLET | Freq: Every day | ORAL | 0 refills | Status: DC

## 2021-03-16 MED ORDER — AMLODIPINE BESYLATE 10 MG PO TABS: ORAL_TABLET | ORAL | 0 refills | Status: DC

## 2021-03-16 NOTE — Telephone Encounter (Signed)
Please review for refill on Eliquis 2.5 mg

## 2021-03-16 NOTE — Telephone Encounter (Signed)
Eliquis 2.5 mg refill request received. Patient is 40 years old, weight- 89 kg, Crea- 0.91 on 05/17/20, Diagnosis-h/o PE, and last seen by Laurann Montana, PA on 07/31/20. Dose is appropriate based on dosing criteria. Will send in refill to requested pharmacy.

## 2021-03-16 NOTE — Telephone Encounter (Signed)
*  STAT* If patient is at the pharmacy, call can be transferred to refill team.   1. Which medications need to be refilled? (please list name of each medication and dose if known)   Amlodipine 10 mg po q d  Eliquis 2.5 mg po BID  Losartan 75 mg po q d    2. Which pharmacy/location (including street and city if local pharmacy) is medication to be sent to? [facility] [address]   3. Do they need a 30 day or 90 day supply? 90  Scheduled overdue visit has been out for a week

## 2021-03-28 ENCOUNTER — Encounter: Payer: Self-pay | Admitting: Physician Assistant

## 2021-03-28 ENCOUNTER — Ambulatory Visit (INDEPENDENT_AMBULATORY_CARE_PROVIDER_SITE_OTHER): Payer: 59 | Admitting: Physician Assistant

## 2021-03-28 ENCOUNTER — Other Ambulatory Visit: Payer: Self-pay

## 2021-03-28 ENCOUNTER — Telehealth: Payer: Self-pay

## 2021-03-28 VITALS — BP 144/88 | HR 79 | Ht 62.0 in | Wt 203.0 lb

## 2021-03-28 DIAGNOSIS — I2699 Other pulmonary embolism without acute cor pulmonale: Secondary | ICD-10-CM

## 2021-03-28 DIAGNOSIS — Z7901 Long term (current) use of anticoagulants: Secondary | ICD-10-CM | POA: Diagnosis not present

## 2021-03-28 DIAGNOSIS — Z86711 Personal history of pulmonary embolism: Secondary | ICD-10-CM

## 2021-03-28 DIAGNOSIS — I10 Essential (primary) hypertension: Secondary | ICD-10-CM

## 2021-03-28 MED ORDER — APIXABAN 2.5 MG PO TABS: 2.5000 mg | ORAL_TABLET | Freq: Two times a day (BID) | ORAL | 1 refills | Status: DC

## 2021-03-28 NOTE — Progress Notes (Signed)
Office Visit    Patient Name: Mary Roberts Date of Encounter: 03/28/2021  PCP:  Hoy Register, [place]  Cardiologist:  Nelva Bush, MD  Advanced Practice Provider:  No care team member to display Electrophysiologist:  None :[phone]}   Chief Complaint    No chief complaint on file.   40 y.o. female with history of PE, HTN, preeclampsia, sz disorder, and who presents today for follow-up of HTN and with report of concern regarding recent leg pain.   Past Medical History    Past Medical History:  Diagnosis Date   Anemia    Anxiety    panic attacks   Carpal tunnel syndrome, bilateral    Carpal tunnel syndrome, bilateral    Chlamydia    Epilepsy seizure, nonconvulsive, generalized ([place])    Headache(784.0)    migraines   History of C-section    Hypertension    Hypertensive heart disease    Infection    UTI   LVH (left ventricular hypertrophy)    a. 12/2018 Echo: EF 60-65%, sev inc LV wall thickness. Diast dysfxn. Triv AI.   Menorrhagia    Migraines    Pregnancy induced hypertension    with first   Pulmonary embolism ([place])    a. 12/2018 CTA Chest (pleuritic c/p): probable small, nonocclusive PE w/in subsegmental RUL branch vessel. Artifact noted as well.  Eliquis initiatied.   Seizures ([employer])    No meds, off since in mid to late 20's   UTI (urinary tract infection)    Past Surgical History:  Procedure Laterality Date   ABDOMINAL HYSTERECTOMY     CESAREAN SECTION     CYSTOSCOPY N/A 06/29/2019   Procedure: CYSTOSCOPY;  Surgeon: Will Bonnet, MD;  Location: [facility];  Service: Gynecology;  Laterality: N/A;   MOUTH SURGERY     TOTAL LAPAROSCOPIC HYSTERECTOMY WITH SALPINGECTOMY N/A 06/29/2019   Procedure: TOTAL LAPAROSCOPIC HYSTERECTOMY WITH SALPINGECTOMY;  Surgeon: Will Bonnet, MD;  Location: [facility];  Service: Gynecology;  Laterality: N/A;   TUBAL LIGATION N/A 10/30/2013   Procedure: POST PARTUM TUBAL LIGATION;   Surgeon: [name], MD;  Location: [place] [facility];  Service: Gynecology;  Laterality: N/A;    Allergies  Allergies  Allergen Reactions   Amoxicillin Hives and Other (See Comments)    Has patient had a PCN reaction causing immediate rash, facial/tongue/throat swelling, SOB or lightheadedness with hypotension: No Has patient had a PCN reaction causing severe rash involving mucus membranes or skin necrosis: No Has patient had a PCN reaction that required hospitalization: No Has patient had a PCN reaction occurring within the last 10 years: Yes If all of the above answers are "NO", then may proceed with Cephalosporin use.    Lisinopril Nausea Only    History of Present Illness    Mary Roberts is a 40 y.o. female with PMH as above.  She has previous intolerance of HCTZ and lisinopril.  In 2020, she was seen in the ED with uncontrolled hypertension, chest pain, dyspnea.  CT showed probable nonocclusive PE with segmental right upper lobe branch vessel.  Lower extremity duplex negative for DVT.  She was admitted for BP management and anticoagulation.  Echo with normal LVEF, DD, severe LVH.  When seen 12/2018, medications consolidated.  02/2019 visit showed stable BP with hydralazine reduced to 25 mg twice daily.  She was admitted 02/2019 with abnormal uterine bleeding and severe anemia with a hemoglobin of 6.5 requiring  2 units PRBC.  She underwent total laparoscopic hysterectomy with bilateral salpingectomy 06/2019.  When seen by Dr. Saunders Revel 10/2019, chlorthalidone was discontinued.  She was referred to hematology for hypercoagulable work-up in the setting of unprovoked PE.  When seen by Dr. Tasia Catchings of oncology 11/2019, it was recommended she hold Eliquis for 3 days and undergo hypercoagulable work-up and changed to Eliquis 2.5 mg twice daily.  She had not completed this referral as of 07/2020.  When last seen in office, she reported BP readings in the yellow range on her home BP cuff.  She indicated preference for once  daily BP medications.  She had occasional headache over her left eye, associated with elevated BP.  Today, 03/28/2021, she returns to clinic and notes she is doing about the same as the previous clinic visit.  She is recently had some left lower extremity pain, located on the medial and side of her ankle.  She wonders if this is a DVT, given she ran out of her Eliquis and so skipped a few doses.  She denies any calf pain, warmth, redness, or palpable cord.  No recent long trips.  She is not on any hormones s/p hysterectomy.  She reports possibly smoking 1 pack of cigarettes total throughout her lifetime.  She denies any recurrence of palpitations or shortness of breath since her PE. She notes some ankle edema/puffiness, but this usually occurs when on her feet all day long.  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  She reports a high salt diet but states that she will likely not change this, given that she lives a busy lifestyle between her kids and her job.  She did not undergo her hematology work-up, with its importance reviewed.  She states that she missed this work-up, because she is a tough stick for blood work, and she did not feel that she was willing to go through this again/would rather continue anticoagulation over repeat labs.  Referral provided again today with patient uncertain if she will go to hematology but willing to consider.  She reports that she recently lost her primary care physician with number provided today.  She has been tolerating losartan 75 mg daily well, though she notes some difficulty with the 75 mg dose when she did not have losartan pills that were scored, so that she could easily break the pill in half.  She reports home SBP usually 140s and at most 149.  When her blood pressure is elevated, she is asymptomatic.  She reports resolution of headaches ever since doing acupuncture of her ear.  She is hesitant to make any changes to her  medications today.  Home Medications   Current Outpatient Medications  Medication Instructions   amLODipine (NORVASC) 10 MG tablet TAKE 1 TABLET(10 MG) BY MOUTH DAILY   apixaban (ELIQUIS) 2.5 mg, Oral, 2 times daily   aspirin-acetaminophen-caffeine (EXCEDRIN MIGRAINE) 250-250-65 MG tablet 2 tablets, Oral, Every 6 hours PRN   losartan (COZAAR) 75 mg, Oral, Daily   Vitamin D (Ergocalciferol) (DRISDOL) 50,000 Units, Oral, Every 7 days     Review of Systems    She denies chest pain, palpitations, dyspnea, pnd, orthopnea, n, v, dizziness, syncope, edema, weight gain, or early satiety.  She reports left lower extremity medial ankle pain with unclear triggers.   All other systems reviewed and are otherwise negative except as noted above.  Physical Exam    VS:  There were no vitals taken for this visit. ,  BMI There is no height or weight on file to calculate BMI. GEN: Well nourished, well developed, in no acute distress. HEENT: normal. Neck: Supple, no JVD, carotid bruits, or masses. Cardiac: RRR, no murmurs, rubs, or gallops. No clubbing, cyanosis, edema.  Radials/DP/PT 2+ and equal bilaterally.  Respiratory:  Respirations regular and unlabored, clear to auscultation bilaterally. GI: Soft, nontender, nondistended, BS + x 4. MS: no deformity or atrophy.  Medial ankle left lower extremity not consistently TTP and without erythema or warmth.  No asymmetric edema between lower extremities. Skin: warm and dry, no rash. Neuro:  Strength and sensation are intact. Psych: Normal affect.  Accessory Clinical Findings    ECG personally reviewed by me today - NSR, 79bpm, poor R wave progression V1 through V2, possible LAE- no acute changes.  VITALS Reviewed today   Temp Readings from Last 3 Encounters:  05/17/20 98.7 F (37.1 C) (Oral)  03/17/20 98.6 F (37 C) (Oral)  12/07/19 97.6 F (36.4 C)   BP Readings from Last 3 Encounters:  07/31/20 (!) 138/92  05/17/20 (!) 141/91  05/01/20 (!)  154/108   Pulse Readings from Last 3 Encounters:  07/31/20 81  05/17/20 78  05/01/20 68    Wt Readings from Last 3 Encounters:  07/31/20 196 lb 2 oz (89 kg)  05/01/20 191 lb (86.6 kg)  03/17/20 186 lb (84.4 kg)     LABS  reviewed today   Lab Results  Component Value Date   WBC 5.6 05/17/2020   HGB 11.5 (L) 05/17/2020   HCT 34.3 (L) 05/17/2020   MCV 88.6 05/17/2020   PLT 268 05/17/2020   Lab Results  Component Value Date   CREATININE 0.91 05/17/2020   BUN 17 05/17/2020   NA 137 05/17/2020   K 3.9 05/17/2020   CL 102 05/17/2020   CO2 26 05/17/2020   Lab Results  Component Value Date   ALT 9 03/17/2020   AST 15 03/17/2020   ALKPHOS 41 03/17/2020   BILITOT 0.5 03/17/2020   Lab Results  Component Value Date   CHOL 186 03/17/2020   HDL 39.60 03/17/2020   LDLCALC 127 (H) 03/17/2020   TRIG 97.0 03/17/2020   CHOLHDL 5 03/17/2020    Lab Results  Component Value Date   HGBA1C 5.7 03/17/2020   Lab Results  Component Value Date   TSH 0.68 03/17/2020     STUDIES/PROCEDURES reviewed today   Echo 12/2018 1. The left ventricle has normal systolic function with an ejection  fraction of 60-65%. The cavity size was normal. There is severely  increased left ventricular wall thickness. Left ventricular diastolic  Doppler parameters are consistent with impaired  relaxation. Elevated left ventricular end-diastolic pressure No evidence  of left ventricular regional wall motion abnormalities.   2. The right ventricle has normal systolic function. The cavity was  normal. There is no increase in right ventricular wall thickness.   3. The mitral valve is grossly normal.   4. The tricuspid valve is grossly normal.   5. The aortic valve is grossly normal. Mild thickening of the aortic  valve. Mild calcification of the aortic valve. Aortic valve regurgitation  is trivial by color flow Doppler. No stenosis of the aortic valve.   6. The aortic root is normal in size and  structure.     Assessment & Plan    Essential hypertension, goal BP less than 130/80 --BP today still suboptimal at 144/88.  She is asymptomatic with elevated BP, reporting her headaches have  been relieved since acupuncture started.  Encouraged goal BP 130/80 or lower.  Reviewed recommendations for salt under 2 g and fluid under 2 L/day.  She does not wish to cut out salt and reports little time for healthy eating and increased activity.  At her previous visit, losartan was increased to 75 mg once daily for additional antihypertensive effect.  As BP today remains suboptimal, we discussed increase of losartan dose versus transition to valsartan.  Also discussed was additional antihypertensive agents.  At this time, and after discussion with the patient, preference is for BMET then possible transition to valsartan or combination olmesartan-amlodipine if renal function and electrolytes allow.  We will obtain a BMET today with further recommendations after these results.  Continue amlodipine 10 mg daily.  Previous intolerance to lisinopril/HCTZ.  Encouraged ongoing avoidance of caffeine/alcohol/tobacco.  She does snore with recommendation she consider sleep study in the future if ever apnea episodes observed.  Regular exercise encouraged.  History of pulmonary embolism on chronic anticoagulation --History of unprovoked PE 12/2018.  No recent chest pain or shortness of breath.  No recent asymmetric edema.  Referred to hematology/oncology at previous clinic visits for hypercoagulable work-up.  She saw Dr. Tasia Catchings 12/12/2018 with recommendation that she hold Eliquis for 3 days and then proceed to hypercoagulable work-up.  She has not yet completed this work-up to date.  Encouraged follow-up and reviewed the reasoning behind this work-up.  Will obtain repeat CBC and BMET today.  Continue Eliquis at reduced dose 2.5 mg twice daily per hematology recommendations.  No signs or symptoms of bleeding.  She does report recent  left medial ankle pain today with patient concern for DVT today and reassurance provided that her ankle pain is not consistent with usual symptoms of DVT.  She also denies any recent palpable cord, erythema, warmth, or asymmetric edema.  She does note a few missed doses of Eliquis, though has restarted her anticoagulation since that time.  No recent increase in sedentary lifestyle or other risks associated with DVT.  She will continue to monitor her ankle pain.  She will continue current Eliquis pending hematology/oncology work-up.  Repeat hematology/oncology referral provided and follow-up encouraged.  Medication changes: Pending labs, possible transition losartan to valsartan / consolidation to [employer] ordered: CBC, BMET Studies / Imaging ordered: hematology/oncology referral renewed Future considerations: Continue Eliquis?, hematology/oncology work-up done?, ?sleep apnea?, Ankle pain better?,  BP improved? Disposition: RTC 1 year, sooner if needed   *Please be aware that the above documentation was completed voice recognition software and may contain dictation errors.     Arvil Chaco, PA-C 03/28/2021

## 2021-03-28 NOTE — Telephone Encounter (Signed)
-----   Message from Kavin Leech, RN sent at 03/28/2021 11:57 AM EDT ----- Regarding: Eliquis refill Patient needs refill for Eliquis 2.5 MG sent to her [facility] [place] [facility] [address].  Thanks,  [employer] RN

## 2021-03-28 NOTE — Patient Instructions (Signed)
Medication Instructions:   Your physician recommends that you continue on your current medications as directed. Please refer to the Current Medication list given to you today.  *If you need a refill on your cardiac medications before your next appointment, please call your pharmacy*   Lab Work:  BMP, CBC to be drawn in office today.  If you have labs (blood work) drawn today and your tests are completely normal, you will receive your results only by: [facility] (if you have MyChart) OR A paper copy in the mail If you have any lab test that is abnormal or we need to change your treatment, we will call you to review the results.   Testing/Procedures: None ordered   Follow-Up: At [facility], you and your health needs are our priority.  As part of our continuing mission to provide you with exceptional heart care, we have created designated Provider Care Teams.  These Care Teams include your primary Cardiologist (physician) and Advanced Practice Providers (APPs -  Physician Assistants and Nurse Practitioners) who all work together to provide you with the care you need, when you need it.  We recommend signing up for the patient portal called "MyChart".  Sign up information is provided on this After Visit Summary.  MyChart is used to connect with patients for Virtual Visits (Telemedicine).  Patients are able to view lab/test results, encounter notes, upcoming appointments, etc.  Non-urgent messages can be sent to your provider as well.   To learn more about what you can do with MyChart, go to [email or web address].    Your next appointment:   1 year(s)  The format for your next appointment:   In Person  Provider:   You may see Nelva Bush, MD or one of the following Advanced Practice Providers on your designated Care Team:   Murray Hodgkins, NP Christell Faith, PA-C Marrianne Mood, PA-C Cadence Kathlen Mody, [place]   Other Instructions

## 2021-03-28 NOTE — Telephone Encounter (Signed)
Eliquis 2.5 mg refill request received. Patient is 40 years old, weight-k 92.1 g, Crea-0.91 on 05/17/20, Diagnosis- h/o PE, and last seen by Laurann Montana, NP on 08/20/20. Eliquis at reduced dose 2.5 mg twice daily per hematology recommendations.  Dose is appropriate based on dosing criteria. Will send in refill to requested pharmacy.

## 2021-03-29 ENCOUNTER — Telehealth: Payer: Self-pay | Admitting: Oncology

## 2021-03-29 ENCOUNTER — Telehealth: Payer: Self-pay | Admitting: Physician Assistant

## 2021-03-29 DIAGNOSIS — Z79899 Other long term (current) drug therapy: Secondary | ICD-10-CM

## 2021-03-29 DIAGNOSIS — I10 Essential (primary) hypertension: Secondary | ICD-10-CM

## 2021-03-29 LAB — CBC
Hematocrit: 35.4 % (ref 34.0–46.6)
Hemoglobin: 12 g/dL (ref 11.1–15.9)
MCH: 28.6 pg (ref 26.6–33.0)
MCHC: 33.9 g/dL (ref 31.5–35.7)
MCV: 85 fL (ref 79–97)
Platelets: 325 10*3/uL (ref 150–450)
RBC: 4.19 x10E6/uL (ref 3.77–5.28)
RDW: 13.2 % (ref 11.7–15.4)
WBC: 4.6 10*3/uL (ref 3.4–10.8)

## 2021-03-29 LAB — BASIC METABOLIC PANEL
BUN/Creatinine Ratio: 14 (ref 9–23)
BUN: 11 mg/dL (ref 6–24)
CO2: 24 mmol/L (ref 20–29)
Calcium: 9.5 mg/dL (ref 8.7–10.2)
Chloride: 102 mmol/L (ref 96–106)
Creatinine, Ser: 0.78 mg/dL (ref 0.57–1.00)
Glucose: 80 mg/dL (ref 70–99)
Potassium: 3.8 mmol/L (ref 3.5–5.2)
Sodium: 142 mmol/L (ref 134–144)
eGFR: 98 mL/min/{1.73_m2} (ref >59)

## 2021-03-29 NOTE — Telephone Encounter (Signed)
Left a VM with patient and requested she call back if she would like to schedule a visit with Dr. Tasia Catchings.  We received a repeat referral from her Cardiologist (pt last seen in 2021) but noted that she was "unsure" about following up with hematology at this time.

## 2021-03-29 NOTE — Telephone Encounter (Signed)
[id number].   If agreeable,  --Discontinue Losartan 75mg   --Discontinue amlodipine 10mg  --Start valsartan 320mg -amlodipine 10mg  (combination medication). --Repeat BMET 1 month

## 2021-03-30 NOTE — Telephone Encounter (Signed)
Patient returning call.

## 2021-03-30 NOTE — Telephone Encounter (Signed)
Returned the call. Lmtcb.

## 2021-04-02 MED ORDER — AMLODIPINE BESYLATE-VALSARTAN 10-320 MG PO TABS: 1.0000 | ORAL_TABLET | Freq: Every day | ORAL | 5 refills | Status: DC

## 2021-04-02 NOTE — Telephone Encounter (Signed)
done

## 2021-04-02 NOTE — Telephone Encounter (Signed)
Result note: Mary Roberts, Mary D, PA-C  P Cv Div [place] Triage Stable kidney function  Will transition her from Losartan to Valsartan as discussed in clinic.  MyChart msg sent that I will call her today to discuss this change again  Repeat BMET in 1 month   Per Mary: If agreeable,  --Discontinue Losartan 75mg   --Discontinue amlodipine 10mg  --Start valsartan 320mg -amlodipine 10mg  (combination medication). --Repeat BMET 1 month

## 2021-04-02 NOTE — Telephone Encounter (Signed)
Spoke with pt. Notified of results and Jacquelyn's recc below.  Pt voiced understanding.  Pt will:   - STOP Losartan 75    - STOP Amlodipine 10 mg   - START Valsartan 320 mg - Amlodipine 10 mg combination tablet daily   - Pt will be contacted from our office to schedule lab (BMET) in 1 month.  New Rx sent to pharmacy. D/C Losartan and Amlodipine. Lab orders placed.  Forwarded to scheduling to place recall for 1 month lab.  Pt has no further questions at this time.

## 2021-04-02 NOTE — Addendum Note (Signed)
Addended by: Britt Bottom on: 04/02/2021 08:04 AM   Modules accepted: Orders

## 2021-04-02 NOTE — Addendum Note (Signed)
Addended by: Darlyne Russian on: 04/02/2021 12:15 PM   Modules accepted: Orders

## 2021-04-24 ENCOUNTER — Telehealth: Payer: Self-pay | Admitting: Internal Medicine

## 2021-04-24 NOTE — Telephone Encounter (Signed)
Lmov to schedule labs

## 2021-04-26 ENCOUNTER — Other Ambulatory Visit: Payer: Self-pay | Admitting: Internal Medicine

## 2021-04-26 DIAGNOSIS — I10 Essential (primary) hypertension: Secondary | ICD-10-CM

## 2021-05-10 ENCOUNTER — Other Ambulatory Visit: Payer: Self-pay

## 2021-05-10 ENCOUNTER — Emergency Department: Payer: 59

## 2021-05-10 ENCOUNTER — Emergency Department
Admission: EM | Admit: 2021-05-10 | Discharge: 2021-05-10 | Disposition: A | Discharge status: Home / Self Care | Payer: 59 | Attending: Emergency Medicine | Admitting: Emergency Medicine

## 2021-05-10 ENCOUNTER — Encounter: Payer: Self-pay | Admitting: Emergency Medicine

## 2021-05-10 DIAGNOSIS — B349 Viral infection, unspecified: Secondary | ICD-10-CM | POA: Diagnosis not present

## 2021-05-10 DIAGNOSIS — J101 Influenza due to other identified influenza virus with other respiratory manifestations: Secondary | ICD-10-CM | POA: Insufficient documentation

## 2021-05-10 DIAGNOSIS — Z7901 Long term (current) use of anticoagulants: Secondary | ICD-10-CM | POA: Diagnosis not present

## 2021-05-10 DIAGNOSIS — I119 Hypertensive heart disease without heart failure: Secondary | ICD-10-CM | POA: Insufficient documentation

## 2021-05-10 DIAGNOSIS — Z79899 Other long term (current) drug therapy: Secondary | ICD-10-CM | POA: Insufficient documentation

## 2021-05-10 DIAGNOSIS — Z20822 Contact with and (suspected) exposure to covid-19: Secondary | ICD-10-CM | POA: Diagnosis not present

## 2021-05-10 DIAGNOSIS — J111 Influenza due to unidentified influenza virus with other respiratory manifestations: Secondary | ICD-10-CM

## 2021-05-10 DIAGNOSIS — E876 Hypokalemia: Secondary | ICD-10-CM | POA: Insufficient documentation

## 2021-05-10 DIAGNOSIS — R509 Fever, unspecified: Secondary | ICD-10-CM | POA: Diagnosis present

## 2021-05-10 LAB — COMPREHENSIVE METABOLIC PANEL
ALT: 19 U/L (ref 0–44)
AST: 36 U/L (ref 15–41)
Albumin: 3.8 g/dL (ref 3.5–5.0)
Alkaline Phosphatase: 43 U/L (ref 38–126)
Anion gap: 6 (ref 5–15)
BUN: 17 mg/dL (ref 6–20)
CO2: 25 mmol/L (ref 22–32)
Calcium: 8.7 mg/dL — ABNORMAL LOW (ref 8.9–10.3)
Chloride: 102 mmol/L (ref 98–111)
Creatinine, Ser: 1.13 mg/dL — ABNORMAL HIGH (ref 0.44–1.00)
GFR, Estimated: >60 mL/min (ref >60)
Glucose, Bld: 103 mg/dL — ABNORMAL HIGH (ref 70–99)
Potassium: 3.1 mmol/L — ABNORMAL LOW (ref 3.5–5.1)
Sodium: 133 mmol/L — ABNORMAL LOW (ref 135–145)
Total Bilirubin: 0.8 mg/dL (ref 0.3–1.2)
Total Protein: 8.1 g/dL (ref 6.5–8.1)

## 2021-05-10 LAB — CBC WITH DIFFERENTIAL/PLATELET
Abs Immature Granulocytes: 0.03 10*3/uL (ref 0.00–0.07)
Basophils Absolute: 0 10*3/uL (ref 0.0–0.1)
Basophils Relative: 0 %
Eosinophils Absolute: 0 10*3/uL (ref 0.0–0.5)
Eosinophils Relative: 0 %
HCT: 35.1 % — ABNORMAL LOW (ref 36.0–46.0)
Hemoglobin: 11.7 g/dL — ABNORMAL LOW (ref 12.0–15.0)
Immature Granulocytes: 1 %
Lymphocytes Relative: 9 %
Lymphs Abs: 0.5 10*3/uL — ABNORMAL LOW (ref 0.7–4.0)
MCH: 28.7 pg (ref 26.0–34.0)
MCHC: 33.3 g/dL (ref 30.0–36.0)
MCV: 86.2 fL (ref 80.0–100.0)
Monocytes Absolute: 0.2 10*3/uL (ref 0.1–1.0)
Monocytes Relative: 3 %
Neutro Abs: 4.8 10*3/uL (ref 1.7–7.7)
Neutrophils Relative %: 87 %
Platelets: 177 10*3/uL (ref 150–400)
RBC: 4.07 MIL/uL (ref 3.87–5.11)
RDW: 13.7 % (ref 11.5–15.5)
WBC: 5.5 10*3/uL (ref 4.0–10.5)
nRBC: 0 % (ref 0.0–0.2)

## 2021-05-10 LAB — URINALYSIS, ROUTINE W REFLEX MICROSCOPIC
Bilirubin Urine: NEGATIVE
Glucose, UA: NEGATIVE mg/dL
Ketones, ur: NEGATIVE mg/dL
Leukocytes,Ua: NEGATIVE
Nitrite: NEGATIVE
Protein, ur: 30 mg/dL — AB
Specific Gravity, Urine: 1.012 (ref 1.005–1.030)
pH: 5 (ref 5.0–8.0)

## 2021-05-10 LAB — RESP PANEL BY RT-PCR (FLU A&B, COVID) ARPGX2
Influenza A by PCR: NEGATIVE
Influenza B by PCR: NEGATIVE
SARS Coronavirus 2 by RT PCR: NEGATIVE

## 2021-05-10 LAB — LACTIC ACID, PLASMA: Lactic Acid, Venous: 1.2 mmol/L (ref 0.5–1.9)

## 2021-05-10 MED ORDER — SODIUM CHLORIDE 0.9 % IV BOLUS
1000.0000 mL | Freq: Once | INTRAVENOUS | Status: AC
  Administered 2021-05-10: 16:00:00 1000 mL via INTRAVENOUS

## 2021-05-10 MED ORDER — ACETAMINOPHEN 325 MG PO TABS
650.0000 mg | ORAL_TABLET | Freq: Once | ORAL | Status: AC | PRN
  Administered 2021-05-10: 14:00:00 650 mg via ORAL
  Filled 2021-05-10: qty 2

## 2021-05-10 MED ORDER — ONDANSETRON 4 MG PO TBDP: 4.0000 mg | ORAL_TABLET | Freq: Four times a day (QID) | ORAL | 0 refills | Status: DC | PRN

## 2021-05-10 MED ORDER — IBUPROFEN 600 MG PO TABS
600.0000 mg | ORAL_TABLET | ORAL | Status: AC
  Administered 2021-05-10: 16:00:00 600 mg via ORAL
  Filled 2021-05-10: qty 1

## 2021-05-10 MED ORDER — POTASSIUM CHLORIDE ER 10 MEQ PO TBCR: 10.0000 meq | EXTENDED_RELEASE_TABLET | Freq: Every day | ORAL | 0 refills | Status: DC

## 2021-05-10 MED ORDER — POTASSIUM CHLORIDE 20 MEQ PO PACK
40.0000 meq | PACK | Freq: Every day | ORAL | Status: DC
  Administered 2021-05-10: 18:00:00 40 meq via ORAL
  Filled 2021-05-10: qty 2

## 2021-05-10 NOTE — ED Provider Notes (Signed)
Emergency Medicine Provider Triage Evaluation Note  Mary Roberts, a 40 y.o. female  was evaluated in triage.  Pt complains of onset of fever and body aches since Monday.  Patient denies any associated cough or nausea.  Last dose Tylenol at 930 this morning prior to arrival.  Review of Systems  Positive: Fever and body aches Negative: CP, SOB  Physical Exam  BP 134/89 (BP Location: Left Arm)   Pulse (!) 111   Temp (!) 102.9 F (39.4 C) (Oral)   Resp 16   Ht 5\' 2"  (1.575 m)   Wt 92.1 kg   SpO2 99%   BMI 37.14 kg/m  Gen:   Awake, no distress  NAD Resp:  Normal effort CTA MSK:   Moves extremities without difficulty  Other:  CVS: RRR  Medical Decision Making  Medically screening exam initiated at 2:17 PM.  Appropriate orders placed.  Mary Roberts was informed that the remainder of the evaluation will be completed by another provider, this initial triage assessment does not replace that evaluation, and the importance of remaining in the ED until their evaluation is complete.  Patient ED evaluation for onset of fevers and body aches since Monday.   Melvenia Needles, PA-C 05/10/21 1418    Naaman Plummer, MD 05/10/21 [phone]

## 2021-05-10 NOTE — ED Notes (Signed)
See triage note  presents with body aches and fever    states she developed sx's on Monday  denies any cough

## 2021-05-10 NOTE — ED Provider Notes (Signed)
[facility] Emergency Department Provider Note  ____________________________________________   Event Date/Time   First MD Initiated Contact with Patient 05/10/21 1439     (approximate)  I have reviewed the triage vital signs and the nursing notes.   HISTORY  Chief Complaint Fever and Generalized Body Aches    HPI Mary Roberts is a 40 y.o. female history of anemia hypertension pulmonary embolism currently remains on anticoagulation daily  Since Monday has been having symptoms of fever and body aches.  Reports no other symptoms other than a mild headache that will come and go.  Slight decrease in appetite but still eating and drinking.  She is been using Tylenol as needed at home.  Slight fatigue.  No cough no sore throat no neck pain.  No rashes no swelling anywhere.  Denies shortness of breath.  She continues on her anticoagulation has been compliant with this regimen  Has not had an influenza vaccine  Denies nausea or vomiting.  No diarrhea.  No abdominal pain.  Past Medical History:  Diagnosis Date   Anemia    Anxiety    panic attacks   Carpal tunnel syndrome, bilateral    Carpal tunnel syndrome, bilateral    Chlamydia    Epilepsy seizure, nonconvulsive, generalized ([employer])    Headache(784.0)    migraines   History of C-section    Hypertension    Hypertensive heart disease    Infection    UTI   LVH (left ventricular hypertrophy)    a. 12/2018 Echo: EF 60-65%, sev inc LV wall thickness. Diast dysfxn. Triv AI.   Menorrhagia    Migraines    Pregnancy induced hypertension    with first   Pulmonary embolism ([place])    a. 12/2018 CTA Chest (pleuritic c/p): probable small, nonocclusive PE w/in subsegmental RUL branch vessel. Artifact noted as well.  Eliquis initiatied.   Seizures ([place])    No meds, off since in mid to late 20's   UTI (urinary tract infection)     Patient Active Problem List   Diagnosis Date Noted   History of pulmonary  embolism 11/20/2019   Status post laparoscopic hysterectomy 06/29/2019   Anemia 03/03/2019   Acute pulmonary embolism without acute cor pulmonale ([place]) 01/14/2019   Hypertensive urgency 01/07/2019   Menorrhagia with regular cycle 07/15/2018   Anemia associated with acute blood loss 07/15/2018   Uncontrolled hypertension 02/01/2018   Anemia due to chronic blood loss 07/14/2016   Bilateral pneumonia 07/14/2016   HTN (hypertension) 07/14/2016   Acute hypokalemia 07/14/2016   Heavy menstrual bleeding 07/14/2016   Intertrochanteric fracture of left femur ([employer]) 07/14/2016   Carpal tunnel syndrome, bilateral    Pre-eclampsia, delivered 10/29/2013   History of seizures/last episode >15 yrs ago 10/28/2013   History of Severe pre-eclampsia 10/28/2013   Birth control 05/10/2011   Encounter for Depo-Provera contraception 05/10/2011   Acute upper respiratory infection 09/24/2010   Sprain and strain of other specified sites of shoulder and upper arm 09/24/2010   Iron deficiency anemia secondary to inadequate dietary iron intake 10/09/2009   Genital herpes 10/06/2009   Herpes simplex virus (HSV) infection 10/06/2009   Epilepsy ([place]) 07/28/2009    Past Surgical History:  Procedure Laterality Date   ABDOMINAL HYSTERECTOMY     CESAREAN SECTION     CYSTOSCOPY N/A 06/29/2019   Procedure: CYSTOSCOPY;  Surgeon: Will Bonnet, MD;  Location: [facility] ORS;  Service: Gynecology;  Laterality: N/A;   MOUTH SURGERY  TOTAL LAPAROSCOPIC HYSTERECTOMY WITH SALPINGECTOMY N/A 06/29/2019   Procedure: TOTAL LAPAROSCOPIC HYSTERECTOMY WITH SALPINGECTOMY;  Surgeon: Will Bonnet, MD;  Location: [facility] ORS;  Service: Gynecology;  Laterality: N/A;   TUBAL LIGATION N/A 10/30/2013   Procedure: POST PARTUM TUBAL LIGATION;  Surgeon: [name], MD;  Location: [facility];  Service: Gynecology;  Laterality: N/A;    Prior to Admission medications   Medication Sig Start Date End Date Taking? Authorizing Provider   ondansetron (ZOFRAN ODT) 4 MG disintegrating tablet Take 1 tablet (4 mg total) by mouth every 6 (six) hours as needed for nausea or vomiting. 05/10/21  Yes Delman Kitten, MD  potassium chloride (KLOR-CON) 10 MEQ tablet Take 1 tablet (10 mEq total) by mouth daily. 05/10/21  Yes Delman Kitten, MD  amLODipine-valsartan (EXFORGE) 10-320 MG tablet Take 1 tablet by mouth daily. 04/02/21 09/29/21  Marrianne Mood D, PA-C  apixaban (ELIQUIS) 2.5 MG TABS tablet Take 1 tablet (2.5 mg total) by mouth 2 (two) times daily. 03/28/21   Kate Sable, MD  aspirin-acetaminophen-caffeine (EXCEDRIN MIGRAINE) [phone] MG tablet Take 2 tablets by mouth every 6 (six) hours as needed for headache.     [provider]  Vitamin D, Ergocalciferol, (DRISDOL) 1.25 MG (50000 UNIT) CAPS capsule Take 1 capsule (50,000 Units total) by mouth every 7 (seven) days. 03/19/20   Marval Regal, NP    Allergies Amoxicillin and Lisinopril  Family History  Problem Relation Age of Onset   Seizures Mother    Hypertension Mother    Diabetes Mother    Heart disease Mother    Stroke Mother    Arthritis Mother    Seizures Brother    Hearing loss Neg Hx     Social History Social History   Tobacco Use   Smoking status: Never   Smokeless tobacco: Never   Tobacco comments:    has smoked 1 pack in 1 week her life  Vaping Use   Vaping Use: Never used  Substance Use Topics   Alcohol use: Yes    Comment: socially a few drinks a week or two   Drug use: No    Review of Systems Constitutional: Fevers chills body aches and some fatigue since Monday.  Ports symptoms ongoing not getting better and not necessarily getting worse. Eyes: No visual changes. ENT: No sore throat. Cardiovascular: Denies chest pain. Respiratory: Denies shortness of breath. Gastrointestinal: No abdominal pain.   Genitourinary: Negative for dysuria. Musculoskeletal: Negative for back pain. Skin: Negative for rash. Neurological: Negative for  headaches, areas of focal weakness or numbness.    ____________________________________________   PHYSICAL EXAM:  VITAL SIGNS: ED Triage Vitals  Enc Vitals Group     BP 05/10/21 1318 134/89     Pulse Rate 05/10/21 1318 (!) 111     Resp 05/10/21 1318 16     Temp 05/10/21 1318 (!) 102.9 F (39.4 C)     Temp Source 05/10/21 1318 Oral     SpO2 05/10/21 1318 99 %     Weight 05/10/21 1318 203 lb 0.7 oz (92.1 kg)     Height 05/10/21 1318 5\' 2"  (1.575 m)     Head Circumference --      Peak Flow --      Pain Score 05/10/21 1318 2     Pain Loc --      Pain Edu? --      Excl. in [place]? --     Constitutional: Alert and oriented.  Appears fatigued, mildly ill  but in no distress.  She is pleasant.  Resting with her sweatshirt on, laying on her left side.  Alert sits up independently. Eyes: Conjunctivae are normal. Head: Atraumatic. Nose: No congestion/rhinnorhea. Mouth/Throat: Mucous membranes are moist.  No tonsillar exudates or edema.  Normal tongue and posterior oropharynx. Neck: No stridor.  No meningismus Cardiovascular: Slightly tachycardic rate, regular rhythm. Grossly normal heart sounds.  Good peripheral circulation. Respiratory: Normal respiratory effort.  No retractions. Lungs CTAB. Gastrointestinal: Soft and nontender. No distention.  Negative Murphy.  No pain McBurney's point.  No pain in any quadrant.  No CVA tenderness bilateral Musculoskeletal: No lower extremity tenderness nor edema. Neurologic:  Normal speech and language. No gross focal neurologic deficits are appreciated.  Skin:  Skin is warm, dry and intact. No rash noted. Psychiatric: Mood and affect are normal. Speech and behavior are normal.  Overall nontoxic well-appearing reassuring exam. ____________________________________________   LABS (all labs ordered are listed, but only abnormal results are displayed)  Labs Reviewed  CBC WITH DIFFERENTIAL/PLATELET - Abnormal; Notable for the following components:       Result Value   Hemoglobin 11.7 (*)    HCT 35.1 (*)    Lymphs Abs 0.5 (*)    All other components within normal limits  URINALYSIS, ROUTINE W REFLEX MICROSCOPIC - Abnormal; Notable for the following components:   Color, Urine YELLOW (*)    APPearance HAZY (*)    Hgb urine dipstick MODERATE (*)    Protein, ur 30 (*)    Bacteria, UA RARE (*)    All other components within normal limits  COMPREHENSIVE METABOLIC PANEL - Abnormal; Notable for the following components:   Sodium 133 (*)    Potassium 3.1 (*)    Glucose, Bld 103 (*)    Creatinine, Ser 1.13 (*)    Calcium 8.7 (*)    All other components within normal limits  RESP PANEL BY RT-PCR (FLU A&B, COVID) ARPGX2  CULTURE, BLOOD (SINGLE)  URINE CULTURE  LACTIC ACID, PLASMA   ____________________________________________  EKG   ____________________________________________  RADIOLOGY  DG Chest 2 View  Result Date: 05/10/2021 CLINICAL DATA:  fever EXAM: CHEST - 2 VIEW COMPARISON:  None. FINDINGS: The cardiomediastinal silhouette is within normal limits. No pleural effusion. No pneumothorax. No mass or consolidation. No acute osseous abnormality. IMPRESSION: No acute findings in the chest.  No consolidative pneumonia. Electronically Signed   By: Albin Felling M.D.   On: 05/10/2021 16:22    ____________________________________________   PROCEDURES  Procedure(s) performed: None  Procedures  Critical Care performed: No  ____________________________________________   INITIAL IMPRESSION / ASSESSMENT AND PLAN / ED COURSE  Pertinent labs & imaging results that were available during my care of the patient were reviewed by me and considered in my medical decision making (see chart for details).   Patient with reassuring clinical exam nontoxic in appearance but noted to be notably febrile slightly tachycardic.  Her symptoms overall seem viral-like however she does have what complex medical history and her flu test and  coronavirus test here negative.  She is also febrile tachycardic.  Normotensive fully awake and alert.  We will send screening labs to evaluate for risk of sepsis including CBC metabolic panel lactic acid and a single blood culture.  Also evaluate for other sources of potential infection and obtain chest x-ray urinalysis.    Clinical Course as of 05/10/21 1911  Thu May 10, 2021  1528 No known bites or rashes.  [MQ]  1528 No travel  outside [place]. No known sick contacts. [MQ]  9163 Patient is resting comfortably this time.  Reports that she feels better.  Heart rate improved still febrile but less so.  Lab work with normal white count normal lactic acid.  Thus far new noted source of a bacterial or fungal type infection.  Suspect likely viral, but will check urinalysis.  Patient feels ready to walk to bathroom to give urine sample.  We will replete potassium which is noted to be at 3.1 [MQ]    Clinical Course User Index [MQ] Delman Kitten, MD   Vitals:   05/10/21 1452 05/10/21 1645  BP:  138/88  Pulse:  100  Resp:  16  Temp: (!) 103.5 F (39.7 C) (!) 102.4 F (39.1 C)  SpO2:  99%     Patient alert in no distress.  Appears well.  Vital signs normalized with exception to fever.  Her symptoms seem very viral-like no evidence of bacterial infection by clinical history examination or laboratory testing denoted.  Discussed with the patient careful return precautions, follow-up with her physician and will prescribe prescription for antiemetic as well as brief course of potassium with completion.  She is fully alert speaking in normal clear sentences without distress well perfused.  Patient nontoxic well-appearing appears appropriate for ongoing outpatient treatment for likely viral illness.  Return precautions and treatment recommendations and follow-up discussed with the patient who is agreeable with the plan.   ____________________________________________   FINAL CLINICAL IMPRESSION(S) / ED  DIAGNOSES  Final diagnoses:  Influenza-like illness  Viral illness  Hypokalemia        Note:  This document was prepared using Dragon voice recognition software and may include unintentional dictation errors       Delman Kitten, MD 05/10/21 1912

## 2021-05-10 NOTE — ED Triage Notes (Signed)
Pt comes into the ED via POV c/o fever and body aches since Monday.  Pt denies any cough or nausea at this time and isnt aware that she has been around anyone that has COVID.  Pt ambulatory with even and unlabored respirations.  Pt last took Tylenol today at 9:30 this morning.

## 2021-05-12 ENCOUNTER — Emergency Department
Admission: EM | Admit: 2021-05-12 | Discharge: 2021-05-12 | Discharge status: Against Medical Advice | Payer: 59 | Attending: Emergency Medicine | Admitting: Emergency Medicine

## 2021-05-12 ENCOUNTER — Other Ambulatory Visit: Payer: Self-pay

## 2021-05-12 DIAGNOSIS — Z5321 Procedure and treatment not carried out due to patient leaving prior to being seen by health care provider: Secondary | ICD-10-CM | POA: Insufficient documentation

## 2021-05-12 DIAGNOSIS — R509 Fever, unspecified: Secondary | ICD-10-CM | POA: Diagnosis not present

## 2021-05-12 LAB — URINE CULTURE

## 2021-05-12 NOTE — ED Triage Notes (Signed)
Pt to ED for fever x1 week. Last seen on Thursday.

## 2021-05-12 NOTE — ED Provider Notes (Signed)
Emergency Medicine Provider Triage Evaluation Note  Mary Roberts , a 40 y.o. female  was evaluated in triage.  Pt complains of complaints of continued fever and chills and body aches.  Patient was seen here on Thursday and had a complete work-up.  Patient states she does not want anything else worked up.  She states she wants an antibiotic to make her fever go away..  Review of Systems  Positive: Fever, chills, body aches Negative: No vomiting diarrhea, no chest pain or shortness of  Physical Exam  BP (!) 132/94   Pulse (!) 103   Temp 98.7 F (37.1 C) (Oral)   Resp 20   Ht 5\' 2"  (1.575 m)   Wt 92 kg   SpO2 100%   BMI 37.10 kg/m  Gen:   Awake, no distress   Resp:  Normal effort  MSK:   Moves extremities without difficulty  Other:    Medical Decision Making  Medically screening exam initiated at 1:02 PM.  Appropriate orders placed.  LESLEE SUIRE was informed that the remainder of the evaluation will be completed by another provider, this initial triage assessment does not replace that evaluation, and the importance of remaining in the ED until their evaluation is complete.  Patient was advised to stay and at least have her urine rechecked as her urine culture grew out possible contamination.  Patient states she does not want any more labs done and does not want to have to wait.  States she will see her doctor next week.  Once again she was encouraged to stay.  Told her I cannot force her to stay but I did think that she at least needed to recheck her urine.  Patient once again states she wants to leave and left without being seen after triage.   Versie Starks, PA-C 05/12/21 1303    Delman Kitten, MD 05/12/21 [phone]

## 2021-05-15 LAB — CULTURE, BLOOD (SINGLE)
Culture: NO GROWTH
Special Requests: ADEQUATE

## 2021-05-21 ENCOUNTER — Telehealth: Payer: Self-pay | Admitting: Internal Medicine

## 2021-05-21 NOTE — Telephone Encounter (Signed)
Pt c/o medication issue:  1. Name of Medication: exforge   2. How are you currently taking this medication (dosage and times per day)? New   3. Are you having a reaction (difficulty breathing--STAT)? Dizziness feels funny when laying down   4. What is your medication issue? Held today and not having dizziness    Can leave detailed msg is no answer

## 2021-05-21 NOTE — Telephone Encounter (Signed)
Spoke with pt.  Pt states she was sick with fever, unknown type virus (Covid and Flu were negative) last week or so.  Pt reports fully recovered now.  States after being sick she has started to have dizziness sensation but only after she lays down.  Pt takes her Amlodpine-Valsartan 10-320 mg at night before bed.  States after she lays down is when she feels dizzy.  Pt held Amlodipine-Valsartan last night and had no dizzy episode.  Discussed possible vertigo s/s and pt reports it feels "more like when I used to feel when I was about to have a seizure." Pt states she no longer takes medication for seizure.  Pt feels she is staying hydrated well even while she was sick.  Pt does not have BP to report. Home BP machine needs batteries.  Pt also reports she is between PCP at the moment and unable to follow up regarding other possible causes.   Advised pt hold Amlodipine-Valsartan again tonight if her symptoms improved last night after holding. Advised to get BP readings if able, to ensure staying well hydrated.  Notified pt I will make Dr. Saunders Revel aware and let her know of further recc.

## 2021-05-22 NOTE — Telephone Encounter (Signed)
Patient returning call.  She states she realized she must have been taking 2 of the same meds ( one combo and one single ) . She now believes she was supposed to dc and then start the other.   Please call to discuss .

## 2021-05-22 NOTE — Telephone Encounter (Signed)
Attempted to call the patient to offer an APP appointment as per Dr. Saunders Revel. No answer- I left a message to please call back.

## 2021-05-22 NOTE — Telephone Encounter (Signed)
Please arrange for the patient to be seen by an APP at her convenience.  She should also reestablish with a PCP.  Mary Bush, MD [facility]

## 2021-05-22 NOTE — Telephone Encounter (Signed)
Spoke with patient and reviewed Dr. Darnelle Bos recommendations. She discovered and reports that she was taking amlodipine in addition to the combination amlodipine and valsartan. She has since made the changes and is aware to monitor blood pressures. Offered her multiple appointments which did not work for her. Scheduled her next available with provider here in the office and instructed her to call back if her symptoms persist or worsen. She verbalized understanding with no further questions at this time.

## 2021-05-25 NOTE — Telephone Encounter (Signed)
Mirena rcvd/charged 04/05/2019

## 2021-05-28 ENCOUNTER — Other Ambulatory Visit: Payer: Self-pay

## 2021-05-28 ENCOUNTER — Ambulatory Visit (INDEPENDENT_AMBULATORY_CARE_PROVIDER_SITE_OTHER): Payer: 59 | Admitting: Adult Health

## 2021-05-28 ENCOUNTER — Emergency Department
Admission: EM | Admit: 2021-05-28 | Discharge: 2021-05-28 | Disposition: A | Discharge status: Home / Self Care | Payer: 59 | Attending: Emergency Medicine | Admitting: Emergency Medicine

## 2021-05-28 ENCOUNTER — Encounter: Payer: Self-pay | Admitting: Adult Health

## 2021-05-28 VITALS — BP 126/84 | HR 69 | Temp 97.7°F | Ht 62.0 in | Wt 190.2 lb

## 2021-05-28 DIAGNOSIS — Z5321 Procedure and treatment not carried out due to patient leaving prior to being seen by health care provider: Secondary | ICD-10-CM | POA: Insufficient documentation

## 2021-05-28 DIAGNOSIS — Z86711 Personal history of pulmonary embolism: Secondary | ICD-10-CM

## 2021-05-28 DIAGNOSIS — Z6834 Body mass index (BMI) 34.0-34.9, adult: Secondary | ICD-10-CM

## 2021-05-28 DIAGNOSIS — M79606 Pain in leg, unspecified: Secondary | ICD-10-CM | POA: Diagnosis not present

## 2021-05-28 DIAGNOSIS — Z8669 Personal history of other diseases of the nervous system and sense organs: Secondary | ICD-10-CM

## 2021-05-28 DIAGNOSIS — R509 Fever, unspecified: Secondary | ICD-10-CM | POA: Insufficient documentation

## 2021-05-28 DIAGNOSIS — M79603 Pain in arm, unspecified: Secondary | ICD-10-CM | POA: Diagnosis not present

## 2021-05-28 DIAGNOSIS — M791 Myalgia, unspecified site: Secondary | ICD-10-CM | POA: Diagnosis not present

## 2021-05-28 DIAGNOSIS — R5383 Other fatigue: Secondary | ICD-10-CM

## 2021-05-28 NOTE — Patient Instructions (Signed)
Viral Illness, Adult Viruses are tiny germs that can get into a person's body and cause illness. There are many different types of viruses, and they cause many types of illness. Viral illnesses can range from mild to severe. They can affect various parts of the body. Short-term conditions that are caused by a virus include colds and the flu (influenza). Long-term conditions that are caused by a virus include herpes, shingles, and HIV (human immunodeficiency virus) infection. A few viruses have been linked to certain cancers. What are the causes? Many types of viruses can cause illness. Viruses invade cells in your body, multiply, and cause the infected cells to work abnormally or die. When these cells die, they release more of the virus. When this happens, you develop symptoms of the illness, and the virus continues to spread to other cells. If the virus takes over the function of the cell, it can cause the cell to divide and grow out of control. This happens when a virus causes cancer. Different viruses get into the body in different ways. You can get a virus by: Swallowing food or water that has come in contact with the virus (is contaminated). Breathing in droplets that have been coughed or sneezed into the air by an infected person. Touching a surface that has been contaminated with the virus and then touching your eyes, nose, or mouth. Being bitten by an insect or animal that carries the virus. Having sexual contact with a person who is infected with the virus. Being exposed to blood or fluids that contain the virus, either through an open cut or during a transfusion. If a virus enters your body, your body's defense system (immune system) will try to fight the virus. You may be at higher risk for a viral illness if your immune system is weak. What are the signs or symptoms? You may have these symptoms, depending on the type of virus and the location of the cells that it invades: Cold and flu  viruses: Fever. Headache. Sore throat. Muscle aches. Stuffy nose (nasal congestion). Cough. Digestive system (gastrointestinal) viruses: Fever. Pain in the abdomen. Nausea. Diarrhea. Liver viruses (hepatitis): Loss of appetite. Tiredness. Skin or the white parts of your eyes turning yellow (jaundice). Brain and spinal cord viruses: Fever. Headache. Stiff neck. Nausea and vomiting. Confusion or sleepiness. Skin viruses: Warts. Itching. Rash. Sexually transmitted viruses: Discharge. Swelling. Redness. Rash. How is this diagnosed? This condition may be diagnosed based on one or more of the following: Symptoms. Medical history. Physical exam. Blood test, sample of mucus from your lungs (sputum sample), stool sample, or a swab of body fluids or a skin sore (lesion). How is this treated? Viruses can be hard to treat because they live within cells. Antibiotic medicines do not treat viruses because these medicines do not get inside cells. Treatment for a viral illness may include: Resting and drinking plenty of fluids. Medicines to relieve symptoms. These can include over-the-counter medicine for pain and fever, medicines for cough or congestion, and medicines to relieve diarrhea. Antiviral medicines. These medicines are available only for certain types of viruses. Some viral illnesses can be prevented with vaccinations. A common example is the flu shot. Follow these instructions at home: Medicines Take over-the-counter and prescription medicines only as told by your health care provider. If you were prescribed an antiviral medicine, take it as told by your health care provider. Do not stop taking the antiviral even if you start to feel better. Be aware of when antibiotics are   needed and when they are not needed. Antibiotics do not treat viruses. You may get an antibiotic if your health care provider thinks that you may have, or are at risk for, a bacterial infection and you  have a viral infection. Do not ask for an antibiotic prescription if you have been diagnosed with a viral illness. Antibiotics will not make your illness go away faster. Frequently taking antibiotics when they are not needed can lead to antibiotic resistance. When this develops, the medicine no longer works against the bacteria that it normally fights. General instructions  Drink enough fluids to keep your urine pale yellow. Rest as much as possible. Return to your normal activities as told by your health care provider. Ask your health care provider what activities are safe for you. Keep all follow-up visits as told by your health care provider. This is important. How is this prevented? To reduce your risk of viral illness: Wash your hands often with soap and water for at least 20 seconds. If soap and water are not available, use hand sanitizer. Avoid touching your nose, eyes, and mouth, especially if you have not washed your hands recently. If anyone in your household has a viral infection, clean all household surfaces that may have been in contact with the virus. Use soap and hot water. You may also use bleach that you have added water to (diluted). Stay away from people who are sick with symptoms of a viral infection. Do not share items such as toothbrushes and water bottles with other people. Keep your vaccinations up to date. This includes getting a yearly flu shot. Eat a healthy diet and get plenty of rest. Contact a health care provider if: You have symptoms of a viral illness that do not go away. Your symptoms come back after going away. Your symptoms get worse. Get help right away if you have: Trouble breathing. A severe headache or a stiff neck. Severe vomiting or pain in your abdomen. These symptoms may represent a serious problem that is an emergency. Do not wait to see if the symptoms will go away. Get medical help right away. Call your local emergency services (911 in the  [place] Do not drive yourself to the hospital. Summary Viruses are types of germs that can get into a person's body and cause illness. Viral illnesses can range from mild to severe. They can affect various parts of the body. Viruses can be hard to treat. There are medicines to relieve symptoms, and there are some antiviral medicines. If you were prescribed an antiviral medicine, take it as told by your health care provider. Do not stop taking the antiviral even if you start to feel better. Contact a health care provider if you have symptoms of a viral illness that do not go away. This information is not intended to replace advice given to you by your health care provider. Make sure you discuss any questions you have with your health care provider. Document Revised: 10/25/2019 Document Reviewed: 04/20/2019 Elsevier Patient Education  2022 [place]. Hypertension, Adult High blood pressure (hypertension) is when the force of blood pumping through the arteries is too strong. The arteries are the blood vessels that carry blood from the heart throughout the body. Hypertension forces the heart to work harder to pump blood and may cause arteries to become narrow or stiff. Untreated or uncontrolled hypertension can cause a heart attack, heart failure, a stroke, kidney disease, and other problems. A blood pressure reading consists of a higher  number over a lower number. Ideally, your blood pressure should be below 120/80. The first ("top") number is called the systolic pressure. It is a measure of the pressure in your arteries as your heart beats. The second ("bottom") number is called the diastolic pressure. It is a measure of the pressure in your arteries as the heart relaxes. What are the causes? The exact cause of this condition is not known. There are some conditions that result in or are related to high blood pressure. What increases the risk? Some risk factors for high blood pressure are under your  control. The following factors may make you more likely to develop this condition: Smoking. Having type 2 diabetes mellitus, high cholesterol, or both. Not getting enough exercise or physical activity. Being overweight. Having too much fat, sugar, calories, or salt (sodium) in your diet. Drinking too much alcohol. Some risk factors for high blood pressure may be difficult or impossible to change. Some of these factors include: Having chronic kidney disease. Having a family history of high blood pressure. Age. Risk increases with age. Race. You may be at higher risk if you are African American. Gender. Men are at higher risk than women before age 30. After age 21, women are at higher risk than men. Having obstructive sleep apnea. Stress. What are the signs or symptoms? High blood pressure may not cause symptoms. Very high blood pressure (hypertensive crisis) may cause: Headache. Anxiety. Shortness of breath. Nosebleed. Nausea and vomiting. Vision changes. Severe chest pain. Seizures. How is this diagnosed? This condition is diagnosed by measuring your blood pressure while you are seated, with your arm resting on a flat surface, your legs uncrossed, and your feet flat on the floor. The cuff of the blood pressure monitor will be placed directly against the skin of your upper arm at the level of your heart. It should be measured at least twice using the same arm. Certain conditions can cause a difference in blood pressure between your right and left arms. Certain factors can cause blood pressure readings to be lower or higher than normal for a short period of time: When your blood pressure is higher when you are in a health care provider's office than when you are at home, this is called white coat hypertension. Most people with this condition do not need medicines. When your blood pressure is higher at home than when you are in a health care provider's office, this is called masked  hypertension. Most people with this condition may need medicines to control blood pressure. If you have a high blood pressure reading during one visit or you have normal blood pressure with other risk factors, you may be asked to: Return on a different day to have your blood pressure checked again. Monitor your blood pressure at home for 1 week or longer. If you are diagnosed with hypertension, you may have other blood or imaging tests to help your health care provider understand your overall risk for other conditions. How is this treated? This condition is treated by making healthy lifestyle changes, such as eating healthy foods, exercising more, and reducing your alcohol intake. Your health care provider may prescribe medicine if lifestyle changes are not enough to get your blood pressure under control, and if: Your systolic blood pressure is above 130. Your diastolic blood pressure is above 80. Your personal target blood pressure may vary depending on your medical conditions, your age, and other factors. Follow these instructions at home: Eating and drinking  Eat  a diet that is high in fiber and potassium, and low in sodium, added sugar, and fat. An example eating plan is called the DASH (Dietary Approaches to Stop Hypertension) diet. To eat this way: Eat plenty of fresh fruits and vegetables. Try to fill one half of your plate at each meal with fruits and vegetables. Eat whole grains, such as whole-wheat pasta, brown rice, or whole-grain bread. Fill about one fourth of your plate with whole grains. Eat or drink low-fat dairy products, such as skim milk or low-fat yogurt. Avoid fatty cuts of meat, processed or cured meats, and poultry with skin. Fill about one fourth of your plate with lean proteins, such as fish, chicken without skin, beans, eggs, or tofu. Avoid pre-made and processed foods. These tend to be higher in sodium, added sugar, and fat. Reduce your daily sodium intake. Most people  with hypertension should eat less than 1,500 mg of sodium a day. Do not drink alcohol if: Your health care provider tells you not to drink. You are pregnant, may be pregnant, or are planning to become pregnant. If you drink alcohol: Limit how much you use to: 0-1 drink a day for women. 0-2 drinks a day for men. Be aware of how much alcohol is in your drink. In the [place] one drink equals one 12 oz bottle of beer (355 mL), one 5 oz glass of wine (148 mL), or one 1 oz glass of hard liquor (44 mL). Lifestyle  Work with your health care provider to maintain a healthy body weight or to lose weight. Ask what an ideal weight is for you. Get at least 30 minutes of exercise most days of the week. Activities may include walking, swimming, or biking. Include exercise to strengthen your muscles (resistance exercise), such as Pilates or lifting weights, as part of your weekly exercise routine. Try to do these types of exercises for 30 minutes at least 3 days a week. Do not use any products that contain nicotine or tobacco, such as cigarettes, e-cigarettes, and chewing tobacco. If you need help quitting, ask your health care provider. Monitor your blood pressure at home as told by your health care provider. Keep all follow-up visits as told by your health care provider. This is important. Medicines Take over-the-counter and prescription medicines only as told by your health care provider. Follow directions carefully. Blood pressure medicines must be taken as prescribed. Do not skip doses of blood pressure medicine. Doing this puts you at risk for problems and can make the medicine less effective. Ask your health care provider about side effects or reactions to medicines that you should watch for. Contact a health care provider if you: Think you are having a reaction to a medicine you are taking. Have headaches that keep coming back (recurring). Feel dizzy. Have swelling in your ankles. Have trouble with  your vision. Get help right away if you: Develop a severe headache or confusion. Have unusual weakness or numbness. Feel faint. Have severe pain in your chest or abdomen. Vomit repeatedly. Have trouble breathing. Summary Hypertension is when the force of blood pumping through your arteries is too strong. If this condition is not controlled, it may put you at risk for serious complications. Your personal target blood pressure may vary depending on your medical conditions, your age, and other factors. For most people, a normal blood pressure is less than 120/80. Hypertension is treated with lifestyle changes, medicines, or a combination of both. Lifestyle changes include losing weight, eating a  healthy, low-sodium diet, exercising more, and limiting alcohol. This information is not intended to replace advice given to you by your health care provider. Make sure you discuss any questions you have with your health care provider. Document Revised: 02/18/2018 Document Reviewed: 02/18/2018 Elsevier Patient Education  [employer].

## 2021-05-28 NOTE — ED Triage Notes (Signed)
Pt states is having arm and leg pain. Pt states she had a fever around thanksgiving and was tested negative for flu and covid. Pt denies known joint swelling and redness, no rash. Pt appears in no acute distress. Pt does have a history of PE.

## 2021-05-28 NOTE — ED Notes (Signed)
Spoke with dr. Alfred Levins regarding pt's presenting symptoms, no new orders received.

## 2021-05-28 NOTE — Progress Notes (Signed)
Acute Office Visit  Subjective:    Patient ID: Mary Roberts, female    DOB: [birth year], 40 y.o.   MRN: [id number]  Chief Complaint  Patient presents with   Transitions Of Care   Generalized Body Aches    Carpel tunnel pain in both hands and pain in both legs. Sick the week before thanksgiving but tested negative for Covid and flu. Cold symptoms gone but body pain remains.     HPI Patient is in today for body aches, that started over 2 weeks ago.  She reports that she had fever of 105 a week before thanksgiving. She then reports her fever resolved. She was diagnosed with a viral infection. She is still having body aches in her legs and also has generalized arms and body aches.   She also had concerns she has been taking losartan- amlodipine for hypertension. She was also still taking her amlodipine plain tablet with this. She became dizzy. SHE HAS SWITCHED HERSELF BACK TO AMLODIPINE ONLY AND FEELS BETTER.   She had gone to the ER today and left before being seen. She also was seen in ER on 05/10/2021 for viral like illness.  Urine culture at that time presented with multiple organisms. Chest x ray was within normal limits. After hysterectomy.  She has a history of pulmonary embolism of lungs. She has not been taking eliquis due to cost. She has been seeing cardiology and was referred to oncology. IMPRESSION: 1. Probable small nonocclusive embolus within subsegmental right upper lobe branch vessel though evaluation of vessels in the region is limited by artifact arising from dense contrast within the SVC. There are no other suggested filling defects. 2. Clear lung fields Electronically Signed   By: Jasmine Pang M.D.   On: 01/07/2019 02:59  Patient  denies any fever,chills, rash, chest pain, shortness of breath, nausea, vomiting, or diarrhea.     Past Medical History:  Diagnosis Date   Anemia    Anxiety    panic attacks   Carpal tunnel syndrome, bilateral    Carpal tunnel  syndrome, bilateral    Chlamydia    Epilepsy seizure, nonconvulsive, generalized (HCC)    Headache(784.0)    migraines   History of C-section    Hypertension    Hypertensive heart disease    Infection    UTI   LVH (left ventricular hypertrophy)    a. 12/2018 Echo: EF 60-65%, sev inc LV wall thickness. Diast dysfxn. Triv AI.   Menorrhagia    Migraines    Pregnancy induced hypertension    with first   Pulmonary embolism (HCC)    a. 12/2018 CTA Chest (pleuritic c/p): probable small, nonocclusive PE w/in subsegmental RUL branch vessel. Artifact noted as well.  Eliquis initiatied.   Seizures (HCC)    No meds, off since in mid to late 20's   UTI (urinary tract infection)     Past Surgical History:  Procedure Laterality Date   ABDOMINAL HYSTERECTOMY     CESAREAN SECTION     CYSTOSCOPY N/A 06/29/2019   Procedure: CYSTOSCOPY;  Surgeon: Conard Novak, MD;  Location: [facility];  Service: Gynecology;  Laterality: N/A;   MOUTH SURGERY     TOTAL LAPAROSCOPIC HYSTERECTOMY WITH SALPINGECTOMY N/A 06/29/2019   Procedure: TOTAL LAPAROSCOPIC HYSTERECTOMY WITH SALPINGECTOMY;  Surgeon: Conard Novak, MD;  Location: [facility];  Service: Gynecology;  Laterality: N/A;   TUBAL LIGATION N/A 10/30/2013   Procedure: POST PARTUM TUBAL LIGATION;  Surgeon: Reva Bores, MD;  Location: [place] [facility];  Service: Gynecology;  Laterality: N/A;    Family History  Problem Relation Age of Onset   Seizures Mother    Hypertension Mother    Diabetes Mother    Heart disease Mother    Stroke Mother    Arthritis Mother    Seizures Brother    Hearing loss Neg Hx     Social History   Socioeconomic History   Marital status: Single    Spouse name: Not on file   Number of children: Not on file   Years of education: Not on file   Highest education level: Not on file  Occupational History   Not on file  Tobacco Use   Smoking status: Never   Smokeless tobacco: Never   Tobacco comments:    has smoked 1 pack in 1  week her life  Vaping Use   Vaping Use: Never used  Substance and Sexual Activity   Alcohol use: Yes    Comment: socially a few drinks a week or two   Drug use: No   Sexual activity: Yes    Birth control/protection: Surgical  Other Topics Concern   Not on file  Social History Narrative   Teacher- The growing years      Single- 3 kids ages 12-06-15   Social Determinants of Health   Financial Resource Strain: Not on file  Food Insecurity: Not on file  Transportation Needs: Not on file  Physical Activity: Not on file  Stress: Not on file  Social Connections: Not on file  Intimate Partner Violence: Not on file    Outpatient Medications Prior to Visit  Medication Sig Dispense Refill   AMLODIPINE BENZOATE PO Take by mouth daily.     aspirin-acetaminophen-caffeine (EXCEDRIN MIGRAINE) 250-250-65 MG tablet Take 2 tablets by mouth every 6 (six) hours as needed for headache.      LOSARTAN POTASSIUM PO Take by mouth daily.     apixaban (ELIQUIS) 2.5 MG TABS tablet Take 1 tablet (2.5 mg total) by mouth 2 (two) times daily. (Patient not taking: Reported on 05/28/2021) 180 tablet 1   ondansetron (ZOFRAN ODT) 4 MG disintegrating tablet Take 1 tablet (4 mg total) by mouth every 6 (six) hours as needed for nausea or vomiting. (Patient not taking: Reported on 05/28/2021) 20 tablet 0   potassium chloride (KLOR-CON) 10 MEQ tablet Take 1 tablet (10 mEq total) by mouth daily. (Patient not taking: Reported on 05/28/2021) 5 tablet 0   Vitamin D, Ergocalciferol, (DRISDOL) 1.25 MG (50000 UNIT) CAPS capsule Take 1 capsule (50,000 Units total) by mouth every 7 (seven) days. (Patient not taking: Reported on 05/28/2021) 8 capsule 0   amLODipine-valsartan (EXFORGE) 10-320 MG tablet Take 1 tablet by mouth daily. (Patient not taking: Reported on 05/28/2021) 30 tablet 5   No facility-administered medications prior to visit.    Allergies  Allergen Reactions   Amoxicillin Hives and Other (See Comments)    Has  patient had a PCN reaction causing immediate rash, facial/tongue/throat swelling, SOB or lightheadedness with hypotension: No Has patient had a PCN reaction causing severe rash involving mucus membranes or skin necrosis: No Has patient had a PCN reaction that required hospitalization: No Has patient had a PCN reaction occurring within the last 10 years: Yes If all of the above answers are "NO", then may proceed with Cephalosporin use.    Lisinopril Nausea Only    Review of Systems  Constitutional:  Positive for fatigue. Negative for activity change, appetite change, chills,  diaphoresis, fever and unexpected weight change.  HENT:  Positive for postnasal drip and rhinorrhea.   Respiratory: Negative.    Cardiovascular: Negative.   Gastrointestinal: Negative.   Musculoskeletal:  Positive for arthralgias.  Skin: Negative.   Neurological:  Positive for dizziness and weakness.  Psychiatric/Behavioral: Negative.        Objective:    Physical Exam Vitals reviewed.  Constitutional:      General: She is not in acute distress.    Appearance: Normal appearance. She is well-developed. She is not ill-appearing, toxic-appearing or diaphoretic.     Interventions: She is not intubated.    Comments: Patient appers well, not sickly. Speaking in complete sentences. Patient moves on and off of exam table and in room without difficulty. Gait is normal in hall and in room. Patient is oriented to person place time and situation. Patient answers questions appropriately and engages eye contact and verbal dialect with provider.    HENT:     Head: Normocephalic and atraumatic.     Right Ear: External ear normal.     Left Ear: External ear normal.     Nose: Congestion present. No rhinorrhea.     Mouth/Throat:     Mouth: Mucous membranes are moist.     Pharynx: No oropharyngeal exudate or posterior oropharyngeal erythema.  Eyes:     General: Lids are normal. No scleral icterus.       Right eye: No  discharge.        Left eye: No discharge.     Conjunctiva/sclera: Conjunctivae normal.     Right eye: Right conjunctiva is not injected. No exudate or hemorrhage.    Left eye: Left conjunctiva is not injected. No exudate or hemorrhage.    Pupils: Pupils are equal, round, and reactive to light.  Neck:     Thyroid: No thyroid mass or thyromegaly.     Vascular: Normal carotid pulses. No carotid bruit, hepatojugular reflux or JVD.     Trachea: Trachea and phonation normal. No tracheal tenderness or tracheal deviation.     Meningeal: Brudzinski's sign and Kernig's sign absent.  Cardiovascular:     Rate and Rhythm: Normal rate and regular rhythm.     Pulses: Normal pulses.          Radial pulses are 2+ on the right side and 2+ on the left side.       Dorsalis pedis pulses are 2+ on the right side and 2+ on the left side.       Posterior tibial pulses are 2+ on the right side and 2+ on the left side.     Heart sounds: Normal heart sounds, S1 normal and S2 normal. Heart sounds not distant. No murmur heard.   No friction rub. No gallop.  Pulmonary:     Effort: Pulmonary effort is normal. No tachypnea, bradypnea, accessory muscle usage or respiratory distress. She is not intubated.     Breath sounds: Normal breath sounds. No stridor. No wheezing, rhonchi or rales.  Chest:     Chest wall: No tenderness.  Abdominal:     General: Bowel sounds are normal. There is no distension or abdominal bruit.     Palpations: Abdomen is soft. There is no shifting dullness, fluid wave, hepatomegaly, splenomegaly, mass or pulsatile mass.     Tenderness: There is no abdominal tenderness. There is no guarding or rebound.     Hernia: No hernia is present.  Musculoskeletal:        General: No  tenderness or deformity. Normal range of motion.     Cervical back: Full passive range of motion without pain, normal range of motion and neck supple. No edema, erythema or rigidity. No spinous process tenderness or muscular  tenderness. Normal range of motion.  Lymphadenopathy:     Head:     Right side of head: No submental, submandibular, tonsillar, preauricular, posterior auricular or occipital adenopathy.     Left side of head: No submental, submandibular, tonsillar, preauricular, posterior auricular or occipital adenopathy.     Cervical: No cervical adenopathy.     Right cervical: No superficial, deep or posterior cervical adenopathy.    Left cervical: No superficial, deep or posterior cervical adenopathy.     Upper Body:     Right upper body: No supraclavicular or pectoral adenopathy.     Left upper body: No supraclavicular or pectoral adenopathy.  Skin:    General: Skin is warm and dry.     Coloration: Skin is not jaundiced or pale.     Findings: No abrasion, bruising, burn, ecchymosis, erythema, lesion, petechiae or rash.     Nails: There is no clubbing.  Neurological:     Mental Status: She is alert and oriented to person, place, and time.     GCS: GCS eye subscore is 4. GCS verbal subscore is 5. GCS motor subscore is 6.     Cranial Nerves: No cranial nerve deficit.     Sensory: No sensory deficit.     Motor: No weakness, tremor, atrophy, abnormal muscle tone or seizure activity.     Coordination: Coordination normal.     Gait: Gait normal.     Deep Tendon Reflexes: Reflexes are normal and symmetric. Reflexes normal. Babinski sign absent on the right side. Babinski sign absent on the left side.     Reflex Scores:      Tricep reflexes are 2+ on the right side and 2+ on the left side.      Bicep reflexes are 2+ on the right side and 2+ on the left side.      Brachioradialis reflexes are 2+ on the right side and 2+ on the left side.      Patellar reflexes are 2+ on the right side and 2+ on the left side.      Achilles reflexes are 2+ on the right side and 2+ on the left side. Psychiatric:        Mood and Affect: Mood normal.        Speech: Speech normal.        Behavior: Behavior normal.         Thought Content: Thought content normal.        Judgment: Judgment normal.    BP 126/84 (BP Location: Left Arm, Patient Position: Sitting, Cuff Size: Large)   Pulse 69   Temp 97.7 F (36.5 C) (Temporal)   Ht $R'5\' 2"'Ez$  (1.575 m)   Wt 190 lb 3.2 oz (86.3 kg)   LMP 07/06/2018   SpO2 99%   BMI 34.79 kg/m  Wt Readings from Last 3 Encounters:  05/28/21 190 lb 3.2 oz (86.3 kg)  05/28/21 200 lb (90.7 kg)  05/12/21 202 lb 13.2 oz (92 kg)    Health Maintenance Due  Topic Date Due   Pneumococcal Vaccine 59-14 Years old (1 - PCV) Never done    There are no preventive care reminders to display for this patient.   Lab Results  Component Value Date   TSH 0.68 03/17/2020  Lab Results  Component Value Date   WBC 5.5 05/10/2021   HGB 11.7 (L) 05/10/2021   HCT 35.1 (L) 05/10/2021   MCV 86.2 05/10/2021   PLT 177 05/10/2021   Lab Results  Component Value Date   NA 133 (L) 05/10/2021   K 3.1 (L) 05/10/2021   CO2 25 05/10/2021   GLUCOSE 103 (H) 05/10/2021   BUN 17 05/10/2021   CREATININE 1.13 (H) 05/10/2021   BILITOT 0.8 05/10/2021   ALKPHOS 43 05/10/2021   AST 36 05/10/2021   ALT 19 05/10/2021   PROT 8.1 05/10/2021   ALBUMIN 3.8 05/10/2021   CALCIUM 8.7 (L) 05/10/2021   ANIONGAP 6 05/10/2021   EGFR 98 03/28/2021   GFR 92.05 03/17/2020   Lab Results  Component Value Date   CHOL 186 03/17/2020   Lab Results  Component Value Date   HDL 39.60 03/17/2020   Lab Results  Component Value Date   LDLCALC 127 (H) 03/17/2020   Lab Results  Component Value Date   TRIG 97.0 03/17/2020   Lab Results  Component Value Date   CHOLHDL 5 03/17/2020   Lab Results  Component Value Date   HGBA1C 5.7 03/17/2020       Assessment & Plan:   Problem List Items Addressed This Visit       Other   History of pulmonary embolism - Primary   Relevant Orders   Ambulatory referral to Pulmonology   Other Visit Diagnoses     Body mass index (BMI) of 34.0-34.9 in adult        Relevant Orders   CBC with Differential/Platelet   Comprehensive metabolic panel   Urinalysis, Routine w reflex microscopic   CULTURE, URINE COMPREHENSIVE   TSH   History of epilepsy       Relevant Orders   Ambulatory referral to Neurology   Other fatigue       Relevant Orders   VITAMIN D 25 Hydroxy (Vit-D Deficiency, Fractures)      Labs today.  Referrals made.  Advised to keep oncology evaluation as referred by cardiology/ 4 She is feeling much better than previously was.   Red Flags discussed. The patient was given clear instructions to go to ER or return to medical center if any red flags develop, symptoms do not improve, worsen or new problems develop. They verbalized understanding.   Red Flags discussed. The patient was given clear instructions to go to ER or return to medical center if any red flags develop, symptoms do not improve, worsen or new problems develop. They verbalized understanding.  No orders of the defined types were placed in this encounter.  Return in about 1 week (around 06/04/2021), or if symptoms worsen or fail to improve, for at any time for any worsening symptoms, Go to Emergency room/ urgent care if worse.   Marcille Buffy, FNP

## 2021-05-29 ENCOUNTER — Other Ambulatory Visit: Payer: Self-pay | Admitting: Adult Health

## 2021-05-29 LAB — CBC WITH DIFFERENTIAL/PLATELET
Basophils Absolute: 0 10*3/uL (ref 0.0–0.1)
Basophils Relative: 0.8 % (ref 0.0–3.0)
Eosinophils Absolute: 0 10*3/uL (ref 0.0–0.7)
Eosinophils Relative: 0.6 % (ref 0.0–5.0)
HCT: 34.5 % — ABNORMAL LOW (ref 36.0–46.0)
Hemoglobin: 11.4 g/dL — ABNORMAL LOW (ref 12.0–15.0)
Lymphocytes Relative: 20 % (ref 12.0–46.0)
Lymphs Abs: 1 10*3/uL (ref 0.7–4.0)
MCHC: 32.9 g/dL (ref 30.0–36.0)
MCV: 88.4 fl (ref 78.0–100.0)
Monocytes Absolute: 0.7 10*3/uL (ref 0.1–1.0)
Monocytes Relative: 13.9 % — ABNORMAL HIGH (ref 3.0–12.0)
Neutro Abs: 3.1 10*3/uL (ref 1.4–7.7)
Neutrophils Relative %: 64.7 % (ref 43.0–77.0)
Platelets: 404 10*3/uL — ABNORMAL HIGH (ref 150.0–400.0)
RBC: 3.9 Mil/uL (ref 3.87–5.11)
RDW: 16 % — ABNORMAL HIGH (ref 11.5–15.5)
WBC: 4.8 10*3/uL (ref 4.0–10.5)

## 2021-05-29 LAB — TSH: TSH: 0.82 u[IU]/mL (ref 0.35–5.50)

## 2021-05-29 LAB — COMPREHENSIVE METABOLIC PANEL
ALT: 79 U/L — ABNORMAL HIGH (ref 0–35)
AST: 26 U/L (ref 0–37)
Albumin: 4.2 g/dL (ref 3.5–5.2)
Alkaline Phosphatase: 68 U/L (ref 39–117)
BUN: 15 mg/dL (ref 6–23)
CO2: 27 mEq/L (ref 19–32)
Calcium: 9.5 mg/dL (ref 8.4–10.5)
Chloride: 102 mEq/L (ref 96–112)
Creatinine, Ser: 0.93 mg/dL (ref 0.40–1.20)
GFR: 76.42 mL/min (ref >60.00)
Glucose, Bld: 88 mg/dL (ref 70–99)
Potassium: 3.8 mEq/L (ref 3.5–5.1)
Sodium: 139 mEq/L (ref 135–145)
Total Bilirubin: 0.6 mg/dL (ref 0.2–1.2)
Total Protein: 7.9 g/dL (ref 6.0–8.3)

## 2021-05-29 LAB — URINALYSIS, ROUTINE W REFLEX MICROSCOPIC
Bilirubin Urine: NEGATIVE
Leukocytes,Ua: NEGATIVE
Nitrite: NEGATIVE
Specific Gravity, Urine: >=1.03 — AB (ref 1.000–1.030)
Total Protein, Urine: 30 — AB
Urine Glucose: NEGATIVE
Urobilinogen, UA: 0.2 (ref 0.0–1.0)
pH: 6 (ref 5.0–8.0)

## 2021-05-29 LAB — VITAMIN D 25 HYDROXY (VIT D DEFICIENCY, FRACTURES): VITD: 8.29 ng/mL — ABNORMAL LOW (ref 30.00–100.00)

## 2021-05-29 MED ORDER — VITAMIN D (ERGOCALCIFEROL) 1.25 MG (50000 UNIT) PO CAPS: 50000.0000 [IU] | ORAL_CAPSULE | ORAL | 0 refills | Status: DC

## 2021-05-29 NOTE — Progress Notes (Signed)
Urine culture still pending.

## 2021-05-29 NOTE — Progress Notes (Signed)
ALT liver enzyme is elevated - avoid alcohol, and tylenol. Will need referral to gastroenterology if she does not have one please let me know I will place referral.  Large amount blood on microscopic urine, urine culture pending.    CBC appears she has anemia, any heavy menses ? Rectal bleeding ?  TSH for thyroid stable.  Vitamin d is very low will send in script. Will need recheck Vitamin D in 3 months.   She is noted to have had blood in urine microscopic for 3 years, will need urology referral.   Will need to return for labs please place order, hepatitis C screening, acute hepatitis panel- for elevated liver enzymes, as well as iron TIBC, ferritin, for diagnosis of low hemoglobin.

## 2021-05-30 ENCOUNTER — Telehealth: Payer: Self-pay

## 2021-05-30 LAB — CULTURE, URINE COMPREHENSIVE
MICRO NUMBER:: 12714398
SPECIMEN QUALITY:: ADEQUATE

## 2021-05-30 NOTE — Telephone Encounter (Signed)
-----   Message from Doreen Beam, [place] sent at 05/29/2021 11:16 AM EST ----- ALT liver enzyme is elevated - avoid alcohol, and tylenol. Will need referral to gastroenterology if she does not have one please let me know I will place referral.  Large amount blood on microscopic urine, urine culture pending.    CBC appears she has anemia, any heavy menses ? Rectal bleeding ?  TSH for thyroid stable.  Vitamin d is very low will send in script. Will need recheck Vitamin D in 3 months.   She is noted to have had blood in urine microscopic for 3 years, will need urology referral.   Will need to return for labs please place order, hepatitis C screening, acute hepatitis panel- for elevated liver enzymes, as well as iron TIBC, ferritin, for diagnosis of low hemoglobin.

## 2021-05-31 ENCOUNTER — Other Ambulatory Visit: Payer: Self-pay | Admitting: Adult Health

## 2021-05-31 DIAGNOSIS — N3000 Acute cystitis without hematuria: Secondary | ICD-10-CM

## 2021-05-31 MED ORDER — NITROFURANTOIN MONOHYD MACRO 100 MG PO CAPS
100.0000 mg | ORAL_CAPSULE | Freq: Two times a day (BID) | ORAL | 0 refills | Status: DC
Start: 2021-05-31 — End: 2021-06-12

## 2021-05-31 NOTE — Progress Notes (Signed)
Meds ordered this encounter  Medications   nitrofurantoin, macrocrystal-monohydrate, (MACROBID) 100 MG capsule    Sig: Take 1 capsule (100 mg total) by mouth 2 (two) times daily.    Dispense:  10 capsule    Refill:  0    Acute cystitis without hematuria - Plan: nitrofurantoin, macrocrystal-monohydrate, (MACROBID) 100 MG capsule

## 2021-05-31 NOTE — Progress Notes (Signed)
Gram positive UTI, she is allergic to Amoxicillin. I sent Macrobid for five days as follows. Repeat urine microscopic and urine culture  2 weeks after completing antibiotics. Follow up sooner if worsening at anytime   Medications  nitrofurantoin, macrocrystal-monohydrate, (MACROBID) 100 MG capsule   Sig: Take 1 capsule (100 mg total) by mouth 2 (two) times daily.   Dispense:  10 capsule   Refill:  0

## 2021-06-01 ENCOUNTER — Telehealth: Payer: Self-pay | Admitting: Adult Health

## 2021-06-01 NOTE — Telephone Encounter (Signed)
Pt called in stating that she need someone to call her back. Pt stated that her level of pain is urgency for someone to contact her back. Pt stated that she can't sleep at night and that she crys her self to sleep. Pt is requesting callback.

## 2021-06-01 NOTE — Telephone Encounter (Signed)
Pt called in stating that she is having severe pain in her wrist. Pt stated that she started taking ibuprofen (regular dose) from the last visit when you and Pt have spoken in office. Pt stated the ibuprofen wasn't working so she decide to take advil (regular dose). Pt stated that the advil is not working as well. Pt stated that her carpal tunnel is flaring up bad in her wrist and now her feet and legs are hurting and she doesn't know if its related to her carpal tunnel. Pt stated the pain is so unbearable that she can't even work. Pt requesting callback as soon as possible.

## 2021-06-01 NOTE — Telephone Encounter (Signed)
Pt was wondering if someone can prescribe her something more strong. Pt stated that is there something else she can do to get rid of the pain.

## 2021-06-02 ENCOUNTER — Other Ambulatory Visit: Payer: Self-pay

## 2021-06-02 ENCOUNTER — Encounter: Payer: Self-pay | Admitting: Emergency Medicine

## 2021-06-02 ENCOUNTER — Ambulatory Visit
Admission: EM | Admit: 2021-06-02 | Discharge: 2021-06-02 | Disposition: A | Discharge status: Home / Self Care | Payer: 59 | Attending: Family Medicine | Admitting: Family Medicine

## 2021-06-02 DIAGNOSIS — G5602 Carpal tunnel syndrome, left upper limb: Secondary | ICD-10-CM

## 2021-06-02 DIAGNOSIS — M2550 Pain in unspecified joint: Secondary | ICD-10-CM

## 2021-06-02 MED ORDER — TIZANIDINE HCL 4 MG PO TABS
4.0000 mg | ORAL_TABLET | Freq: Every day | ORAL | 0 refills | Status: DC
Start: 2021-06-02 — End: 2021-06-26

## 2021-06-02 MED ORDER — PREDNISONE 20 MG PO TABS: 20.0000 mg | ORAL_TABLET | Freq: Every day | ORAL | 0 refills | Status: AC

## 2021-06-02 NOTE — ED Provider Notes (Signed)
Mary Roberts    CSN: [id number] Arrival date & time: 06/02/21  0804      History   Chief Complaint Chief Complaint  Patient presents with   Joint Pain    HPI Mary Roberts is a 40 y.o. female.   HPI Patient presents today for evaluation of arthralgias ongoing for the last [redacted] weeks along with a carpal tunnel flare involving the left extremity.  Patient has seen her new PCP on 05/28/2021 and was advised to take naproxen as she had previously failed ibuprofen for management of inflammation.  She reports she has been taking consistently the anti-inflammatories without any relief of symptoms.  The pain is intermittent.  She is also describing new pain involving her legs and the bottom of her feet which she describes as tingling and sometimes piercing sharp sensation. Pain is interfering with work and causing her to be awakened during her sleep. Past Medical History:  Diagnosis Date   Anemia    Anxiety    panic attacks   Carpal tunnel syndrome, bilateral    Carpal tunnel syndrome, bilateral    Chlamydia    Epilepsy seizure, nonconvulsive, generalized ([place])    Headache(784.0)    migraines   History of C-section    Hypertension    Hypertensive heart disease    Infection    UTI   LVH (left ventricular hypertrophy)    a. 12/2018 Echo: EF 60-65%, sev inc LV wall thickness. Diast dysfxn. Triv AI.   Menorrhagia    Migraines    Pregnancy induced hypertension    with first   Pulmonary embolism ([place])    a. 12/2018 CTA Chest (pleuritic c/p): probable small, nonocclusive PE w/in subsegmental RUL branch vessel. Artifact noted as well.  Eliquis initiatied.   Seizures ([place])    No meds, off since in mid to late 20's   UTI (urinary tract infection)     Patient Active Problem List   Diagnosis Date Noted   History of pulmonary embolism 11/20/2019   Status post laparoscopic hysterectomy 06/29/2019   Anemia 03/03/2019   Acute pulmonary embolism without acute cor pulmonale ([place])  01/14/2019   Hypertensive urgency 01/07/2019   Menorrhagia with regular cycle 07/15/2018   Anemia associated with acute blood loss 07/15/2018   Uncontrolled hypertension 02/01/2018   Anemia due to chronic blood loss 07/14/2016   Bilateral pneumonia 07/14/2016   HTN (hypertension) 07/14/2016   Acute hypokalemia 07/14/2016   Heavy menstrual bleeding 07/14/2016   Intertrochanteric fracture of left femur ([place]) 07/14/2016   Carpal tunnel syndrome, bilateral    Pre-eclampsia, delivered 10/29/2013   History of seizures/last episode >15 yrs ago 10/28/2013   History of Severe pre-eclampsia 10/28/2013   Birth control 05/10/2011   Encounter for Depo-Provera contraception 05/10/2011   Acute upper respiratory infection 09/24/2010   Sprain and strain of other specified sites of shoulder and upper arm 09/24/2010   Iron deficiency anemia secondary to inadequate dietary iron intake 10/09/2009   Genital herpes 10/06/2009   Herpes simplex virus (HSV) infection 10/06/2009   Epilepsy ([place]) 07/28/2009    Past Surgical History:  Procedure Laterality Date   ABDOMINAL HYSTERECTOMY     CESAREAN SECTION     CYSTOSCOPY N/A 06/29/2019   Procedure: CYSTOSCOPY;  Surgeon: Will Bonnet, MD;  Location: [facility];  Service: Gynecology;  Laterality: N/A;   MOUTH SURGERY     TOTAL LAPAROSCOPIC HYSTERECTOMY WITH SALPINGECTOMY N/A 06/29/2019   Procedure: TOTAL LAPAROSCOPIC HYSTERECTOMY WITH SALPINGECTOMY;  Surgeon: Prentice Docker  D, MD;  Location: [facility];  Service: Gynecology;  Laterality: N/A;   TUBAL LIGATION N/A 10/30/2013   Procedure: POST PARTUM TUBAL LIGATION;  Surgeon: [name], MD;  Location: [facility];  Service: Gynecology;  Laterality: N/A;    OB History     Gravida  4   Para  3   Term  2   Preterm  1   AB  1   Living  3      SAB      IAB  1   Ectopic      Multiple      Live Births  3            Home Medications    Prior to Admission medications   Medication Sig Start  Date End Date Taking? Authorizing Provider  AMLODIPINE BENZOATE PO Take by mouth daily.   Yes [provider]  LOSARTAN POTASSIUM PO Take by mouth daily.   Yes [provider]  nitrofurantoin, macrocrystal-monohydrate, (MACROBID) 100 MG capsule Take 1 capsule (100 mg total) by mouth 2 (two) times daily. 05/31/21   Flinchum, Kelby Aline, FNP  predniSONE (DELTASONE) 20 MG tablet Take 1 tablet (20 mg total) by mouth daily with breakfast for 5 days. 06/02/21 06/07/21 Yes Scot Jun, FNP  tiZANidine (ZANAFLEX) 4 MG tablet Take 1 tablet (4 mg total) by mouth at bedtime. 06/02/21  Yes Scot Jun, FNP  Vitamin D, Ergocalciferol, (DRISDOL) 1.25 MG (50000 UNIT) CAPS capsule Take 1 capsule (50,000 Units total) by mouth every 7 (seven) days. Recheck lab in 3 months 05/29/21  Yes Flinchum, Kelby Aline, FNP  apixaban (ELIQUIS) 2.5 MG TABS tablet Take 1 tablet (2.5 mg total) by mouth 2 (two) times daily. Patient not taking: Reported on 05/28/2021 03/28/21   Kate Sable, MD  aspirin-acetaminophen-caffeine (EXCEDRIN MIGRAINE) [phone] MG tablet Take 2 tablets by mouth every 6 (six) hours as needed for headache.     [provider]  ondansetron (ZOFRAN ODT) 4 MG disintegrating tablet Take 1 tablet (4 mg total) by mouth every 6 (six) hours as needed for nausea or vomiting. Patient not taking: Reported on 05/28/2021 05/10/21   Delman Kitten, MD  potassium chloride (KLOR-CON) 10 MEQ tablet Take 1 tablet (10 mEq total) by mouth daily. Patient not taking: Reported on 05/28/2021 05/10/21   Delman Kitten, MD    Family History Family History  Problem Relation Age of Onset   Seizures Mother    Hypertension Mother    Diabetes Mother    Heart disease Mother    Stroke Mother    Arthritis Mother    Seizures Brother    Hearing loss Neg Hx     Social History Social History   Tobacco Use   Smoking status: Never   Smokeless tobacco: Never   Tobacco comments:    has smoked 1  pack in 1 week her life  Vaping Use   Vaping Use: Never used  Substance Use Topics   Alcohol use: Yes    Comment: socially a few drinks a week or two   Drug use: No     Allergies   Amoxicillin and Lisinopril   Review of Systems Review of Systems Pertinent negatives listed in HPI   Physical Exam Triage Vital Signs ED Triage Vitals  Enc Vitals Group     BP 06/02/21 0821 (!) 142/99     Pulse Rate 06/02/21 0825 100     Resp 06/02/21 0821 18  Temp 06/02/21 0821 98.9 F (37.2 C)     Temp Source 06/02/21 0821 Oral     SpO2 06/02/21 0825 100 %     Weight --      Height --      Head Circumference --      Peak Flow --      Pain Score 06/02/21 0817 10     Pain Loc --      Pain Edu? --      Excl. in [place]? --    No data found.  Updated Vital Signs BP (!) 142/99 (BP Location: Left Arm)   Pulse 100   Temp 98.9 F (37.2 C) (Oral)   Resp 18   LMP 07/06/2018   SpO2 100%   Visual Acuity Right Eye Distance:   Left Eye Distance:   Bilateral Distance:    Right Eye Near:   Left Eye Near:    Bilateral Near:     Physical Exam Constitutional:      Appearance: Normal appearance. She is obese.  Eyes:     Extraocular Movements: Extraocular movements intact.     Pupils: Pupils are equal, round, and reactive to light.  Cardiovascular:     Rate and Rhythm: Normal rate and regular rhythm.  Pulmonary:     Effort: Pulmonary effort is normal.     Breath sounds: Normal breath sounds.  Musculoskeletal:     Comments: No visible deformity or impaired mobility.  Skin:    Capillary Refill: Capillary refill takes less than 2 seconds.  Neurological:     General: No focal deficit present.     Mental Status: She is alert and oriented to person, place, and time.  Psychiatric:        Mood and Affect: Mood normal.        Behavior: Behavior normal.        Thought Content: Thought content normal.        Judgment: Judgment normal.   UC Treatments / Results  Labs (all labs ordered  are listed, but only abnormal results are displayed) Labs Reviewed - No data to display  EKG   Radiology No results found.  Procedures Procedures (including critical care time)  Medications Ordered in UC Medications - No data to display  Initial Impression / Assessment and Plan / UC Course  I have reviewed the triage vital signs and the nursing notes.  Pertinent labs & imaging results that were available during my care of the patient were reviewed by me and considered in my medical decision making (see chart for details).    Arthralgias multiple joints and carpal tunnel syndrome involving the left wrist Patient tried and failed naproxen along with ibuprofen Agreed to trial prednisone 20 mg once daily for 5 days along with tizanidine for acute pain. Encourage patient to take vitamin D consistently as her level is very low and add calcium as this could be one of the sources of her pain. Patient also encouraged to follow-up with her PCP for further work-up and evaluation or referrals for management of carpal tunnel syndrome. Final Clinical Impressions(s) / UC Diagnoses   Final diagnoses:  Arthralgia of multiple joints  Carpal tunnel syndrome of left wrist     Discharge Instructions      Follow-up with your PCP for further work-up of the source of joint pain. For what I suspect is inflammation start prednisone 20 mg once daily x 5 days. For acute pain at nighttime, take Tizanidine 4 mg at  bedtime only ( medication causes drowsiness). I highly recommend taking Vitamin D as prescribed as low levels of vitamin D can cause joint pain. Recommend taking a daily calcium supplement for joint and bone health 1,000 mg daily (Purchase OTC)      ED Prescriptions     Medication Sig Dispense Auth. Provider   predniSONE (DELTASONE) 20 MG tablet Take 1 tablet (20 mg total) by mouth daily with breakfast for 5 days. 5 tablet Scot Jun, FNP   tiZANidine (ZANAFLEX) 4 MG tablet  Take 1 tablet (4 mg total) by mouth at bedtime. 20 tablet Scot Jun, FNP      PDMP not reviewed this encounter.   Scot Jun, [place] 06/02/21 [phone]

## 2021-06-02 NOTE — ED Triage Notes (Signed)
Pt c/o arm, leg and feet pain since a week after thanksgiving. She describes the pain as sharp and throbbing and its making it difficult for her to work.

## 2021-06-02 NOTE — Discharge Instructions (Addendum)
Follow-up with your PCP for further work-up of the source of joint pain. For what I suspect is inflammation start prednisone 20 mg once daily x 5 days. For acute pain at nighttime, take Tizanidine 4 mg at bedtime only ( medication causes drowsiness). I highly recommend taking Vitamin D as prescribed as low levels of vitamin D can cause joint pain. Recommend taking a daily calcium supplement for joint and bone health 1,000 mg daily (Purchase OTC)

## 2021-06-03 ENCOUNTER — Other Ambulatory Visit: Payer: Self-pay | Admitting: Adult Health

## 2021-06-03 DIAGNOSIS — M25531 Pain in right wrist: Secondary | ICD-10-CM

## 2021-06-03 DIAGNOSIS — E559 Vitamin D deficiency, unspecified: Secondary | ICD-10-CM

## 2021-06-03 DIAGNOSIS — M25532 Pain in left wrist: Secondary | ICD-10-CM

## 2021-06-03 NOTE — Telephone Encounter (Signed)
She can make a follow up visit with me however I would prefer she go to the walk in at [facility] for further evaluation of her pain in wrists.  ? Carpal tunnel. I have only seen her for one visit to establish care and she had multiple concerns. Want her to be evaluated further walk in at [facility] is advise asap  Take prescription vitamin D as well as prescribed. Sorry I was out of office by time message was sent.

## 2021-06-03 NOTE — Progress Notes (Signed)
Orders Placed This Encounter  Procedures   Ambulatory referral to Orthopedic Surgery    Referral Priority:   Urgent    Referral Type:   Surgical    Referral Reason:   Specialty Services Required    Requested Specialty:   Orthopedic Surgery    Number of Visits Requested:   1   Pain in both wrists - Plan: Ambulatory referral to Orthopedic Surgery  Vitamin D deficiency - Plan: Ambulatory referral to Orthopedic Surgery

## 2021-06-04 ENCOUNTER — Ambulatory Visit: Payer: 59 | Admitting: Adult Health

## 2021-06-04 NOTE — Telephone Encounter (Signed)
Please see mychart message.

## 2021-06-04 NOTE — Telephone Encounter (Signed)
Patient was evaluated at [facility] on the 06-02-21

## 2021-06-04 NOTE — Telephone Encounter (Signed)
Was diagnosed with left carpal tunnel, evaluation walk in today at [facility] advised.  She should schedule another office visit with me after this evaluation at [facility] for further evaluation.

## 2021-06-04 NOTE — Telephone Encounter (Signed)
Left message for patient to return call back.   Was diagnosed with left carpal tunnel, evaluation walk in today at [facility] advised.  She should schedule another office visit with me after this evaluation at [facility] for further evaluation.

## 2021-06-04 NOTE — Telephone Encounter (Signed)
Not comfortable sending in pain medication until seen again by me. Still do recommend [facility] evaluation they have walk in up until 7 pm Monday to Friday.

## 2021-06-04 NOTE — Telephone Encounter (Signed)
Spoken to patient. She is needing something for the pain until they know what is going on. During work she is having extreme difficulty while walking. She is needing something to help until she can be evaluated. She is unable to go to [facility] currently since she is at work. OTC medication is not touching it. The prednisone and muscle relaxer is not helping as much as she would like. She believes it is helping the inflammation but not the pain.

## 2021-06-12 ENCOUNTER — Encounter: Payer: Self-pay | Admitting: Oncology

## 2021-06-12 ENCOUNTER — Other Ambulatory Visit: Payer: Self-pay

## 2021-06-12 ENCOUNTER — Inpatient Hospital Stay: Payer: 59 | Attending: Oncology | Admitting: Oncology

## 2021-06-12 VITALS — BP 152/95 | HR 74 | Temp 98.5°F | Resp 16 | Ht 62.0 in | Wt 194.0 lb

## 2021-06-12 DIAGNOSIS — Z8249 Family history of ischemic heart disease and other diseases of the circulatory system: Secondary | ICD-10-CM | POA: Diagnosis not present

## 2021-06-12 DIAGNOSIS — Z880 Allergy status to penicillin: Secondary | ICD-10-CM | POA: Diagnosis not present

## 2021-06-12 DIAGNOSIS — Z9079 Acquired absence of other genital organ(s): Secondary | ICD-10-CM | POA: Insufficient documentation

## 2021-06-12 DIAGNOSIS — Z888 Allergy status to other drugs, medicaments and biological substances status: Secondary | ICD-10-CM | POA: Diagnosis not present

## 2021-06-12 DIAGNOSIS — Z86711 Personal history of pulmonary embolism: Secondary | ICD-10-CM | POA: Insufficient documentation

## 2021-06-12 DIAGNOSIS — Z833 Family history of diabetes mellitus: Secondary | ICD-10-CM | POA: Diagnosis not present

## 2021-06-12 DIAGNOSIS — Z820 Family history of epilepsy and other diseases of the nervous system: Secondary | ICD-10-CM | POA: Diagnosis not present

## 2021-06-12 DIAGNOSIS — M1990 Unspecified osteoarthritis, unspecified site: Secondary | ICD-10-CM | POA: Diagnosis not present

## 2021-06-12 DIAGNOSIS — Z7901 Long term (current) use of anticoagulants: Secondary | ICD-10-CM | POA: Insufficient documentation

## 2021-06-12 DIAGNOSIS — M2550 Pain in unspecified joint: Secondary | ICD-10-CM | POA: Diagnosis not present

## 2021-06-12 DIAGNOSIS — Z8639 Personal history of other endocrine, nutritional and metabolic disease: Secondary | ICD-10-CM | POA: Diagnosis not present

## 2021-06-12 DIAGNOSIS — I119 Hypertensive heart disease without heart failure: Secondary | ICD-10-CM | POA: Diagnosis not present

## 2021-06-12 DIAGNOSIS — Z79899 Other long term (current) drug therapy: Secondary | ICD-10-CM | POA: Insufficient documentation

## 2021-06-12 DIAGNOSIS — Z8261 Family history of arthritis: Secondary | ICD-10-CM | POA: Insufficient documentation

## 2021-06-12 DIAGNOSIS — Z823 Family history of stroke: Secondary | ICD-10-CM | POA: Insufficient documentation

## 2021-06-12 DIAGNOSIS — Z87440 Personal history of urinary (tract) infections: Secondary | ICD-10-CM | POA: Insufficient documentation

## 2021-06-12 DIAGNOSIS — Z90710 Acquired absence of both cervix and uterus: Secondary | ICD-10-CM | POA: Diagnosis not present

## 2021-06-12 NOTE — Progress Notes (Signed)
Hematology/Oncology Consult note  [phone] [phone]   Patient Care Team: Doreen Beam, FNP as PCP - General (Family Medicine) End, Harrell Gave, MD as PCP - Cardiology (Cardiology)  REFERRING PROVIDER: No ref. provider found  CHIEF COMPLAINTS/REASON FOR VISIT:  History of pulmonary embolism  HISTORY OF PRESENTING ILLNESS:   Mary Roberts is a  40 y.o.  female with PMH listed below was seen in consultation at the request of  No ref. provider found  for evaluation of pulmonary embolism She presented to ED on 12/28/2018 due to heart palpitation and uncontrolled hypertension.  Patient was found to be in hypertension emergency with blood pressure exceeding 200.  She also has dyspnea associated with palpitation 01/07/2019 CT angiogram chest PE protocol showed probable small nonocclusive embolus within subsegmental right upper lobe branch vessel, though evaluation was limited due to artifact arising from dense contrast within the SVC.  7/172020 US venous lower extremity, no DVT.  No provoking factors were found.  No hospitalization, trauma, OCP use, long distance car ride or flights. Patient was started on heparin drip which was switched to Eliquis at discharge. Patient was referred to hematology for further evaluation and management which she did not establish care and to now. He was recently seen by cardiologist Dr. Saunders Revel and expressed her wishes of come off anticoagulation.  She was referred by cardiology to hematology for further discussion. Patient has been on Eliquis for close to a year now.  She has had excessive uterus bleeding status post hysterectomy in January 2021.  Today she reports feeling well.  Denies any chest pain, shortness of breath, lower extremity swelling. Family history is positive for mother being on blood thinner at some point for blood clots.  She also has a history of seizures currently not on any medication.  She has been off seizure  medication since late 20s.   INTERVAL HISTORY Mary Roberts is a 40 y.o. female who has above history reviewed by me today presents for follow up visit for history of pulmonary embolism.   Patient was seen by me once in June 2021 for unprovoked pulmonary embolism.  PE was diagnosed in July 2020 and patient was recommended to continue Eliquis for anticoagulation.  Patient had hysterectomy due to excessive uterus bleeding in January 2021.  Patient was recommended to have hypercoagulable work-up done which she did not do.  She did not follow-up as well.  Patient was recommended to reestablish care with hematology for further discussion of her anticoagulation. Per patient, she has continued on Eliquis 5 mg twice daily until cardiology switched her to 2.5 mg twice daily.  Then she ran out of the 2.5 mg supply for about a month, November 2022 approximately.  She then took aspirin and then recently resumed on Eliquis 2.5 mg twice daily a few weeks ago.  Patient reports recently she has experienced body aches all over.  She reports that she was tested negative for influenza and COVID-19.  She was seen by orthopedic surgeon and was told that she has arthritis.  Review of Systems  Constitutional:  Negative for appetite change, chills, fatigue and fever.  HENT:   Negative for hearing loss and voice change.   Eyes:  Negative for eye problems.  Respiratory:  Negative for chest tightness and cough.   Cardiovascular:  Negative for chest pain.  Gastrointestinal:  Negative for abdominal distention, abdominal pain and blood in stool.  Endocrine: Negative for hot flashes.  Genitourinary:  Negative for difficulty urinating  and frequency.   Musculoskeletal:  Positive for arthralgias.  Skin:  Negative for itching and rash.  Neurological:  Negative for extremity weakness.  Hematological:  Negative for adenopathy.  Psychiatric/Behavioral:  Negative for confusion.    MEDICAL HISTORY:  Past Medical History:   Diagnosis Date   Anemia    Anxiety    panic attacks   Arthritis    Carpal tunnel syndrome, bilateral    Carpal tunnel syndrome, bilateral    Chlamydia    Epilepsy seizure, nonconvulsive, generalized ([place])    Headache(784.0)    migraines   History of C-section    Hypertension    Hypertensive heart disease    Infection    UTI   LVH (left ventricular hypertrophy)    a. 12/2018 Echo: EF 60-65%, sev inc LV wall thickness. Diast dysfxn. Triv AI.   Menorrhagia    Migraines    Pregnancy induced hypertension    with first   Pulmonary embolism ([place])    a. 12/2018 CTA Chest (pleuritic c/p): probable small, nonocclusive PE w/in subsegmental RUL branch vessel. Artifact noted as well.  Eliquis initiatied.   Seizures ([place])    No meds, off since in mid to late 20's   UTI (urinary tract infection)     SURGICAL HISTORY: Past Surgical History:  Procedure Laterality Date   ABDOMINAL HYSTERECTOMY     CESAREAN SECTION     CYSTOSCOPY N/A 06/29/2019   Procedure: CYSTOSCOPY;  Surgeon: Will Bonnet, MD;  Location: [facility];  Service: Gynecology;  Laterality: N/A;   MOUTH SURGERY     TOTAL LAPAROSCOPIC HYSTERECTOMY WITH SALPINGECTOMY N/A 06/29/2019   Procedure: TOTAL LAPAROSCOPIC HYSTERECTOMY WITH SALPINGECTOMY;  Surgeon: Will Bonnet, MD;  Location: [facility];  Service: Gynecology;  Laterality: N/A;   TUBAL LIGATION N/A 10/30/2013   Procedure: POST PARTUM TUBAL LIGATION;  Surgeon: [name], MD;  Location: [facility];  Service: Gynecology;  Laterality: N/A;    SOCIAL HISTORY: Social History   Socioeconomic History   Marital status: Single    Spouse name: Not on file   Number of children: Not on file   Years of education: Not on file   Highest education level: Not on file  Occupational History   Not on file  Tobacco Use   Smoking status: Never   Smokeless tobacco: Never   Tobacco comments:    has smoked 1 pack in 1 week her life  Vaping Use   Vaping Use: Never used  Substance  and Sexual Activity   Alcohol use: Yes    Comment: socially   Drug use: No   Sexual activity: Yes    Birth control/protection: Surgical  Other Topics Concern   Not on file  Social History Narrative   Teacher- The growing years      Single- 3 kids ages 12-06-15   Social Determinants of Health   Financial Resource Strain: Not on file  Food Insecurity: Not on file  Transportation Needs: Not on file  Physical Activity: Not on file  Stress: Not on file  Social Connections: Not on file  Intimate Partner Violence: Not on file    FAMILY HISTORY: Family History  Problem Relation Age of Onset   Seizures Mother    Hypertension Mother    Diabetes Mother    Heart disease Mother    Stroke Mother    Arthritis Mother    Seizures Brother    Hearing loss Neg Hx     ALLERGIES:  is allergic  to amoxicillin and lisinopril.  MEDICATIONS:  Current Outpatient Medications  Medication Sig Dispense Refill   AMLODIPINE BENZOATE PO Take by mouth daily.     apixaban (ELIQUIS) 2.5 MG TABS tablet Take 1 tablet (2.5 mg total) by mouth 2 (two) times daily. 180 tablet 1   diclofenac (VOLTAREN) 25 MG EC tablet Take 25 mg by mouth 2 (two) times daily.     LOSARTAN POTASSIUM PO Take by mouth daily.     NON FORMULARY Apply 1 Dose topically 2 (two) times daily with breakfast and lunch.     pseudoephedrine-acetaminophen (TYLENOL SINUS) 30-500 MG TABS tablet Take 1 tablet by mouth every 4 (four) hours as needed.     tiZANidine (ZANAFLEX) 4 MG tablet Take 1 tablet (4 mg total) by mouth at bedtime. 20 tablet 0   Vitamin D, Ergocalciferol, (DRISDOL) 1.25 MG (50000 UNIT) CAPS capsule Take 1 capsule (50,000 Units total) by mouth every 7 (seven) days. Recheck lab in 3 months 12 capsule 0   aspirin-acetaminophen-caffeine (EXCEDRIN MIGRAINE) 250-250-65 MG tablet Take 2 tablets by mouth every 6 (six) hours as needed for headache.  (Patient not taking: Reported on 06/12/2021)     ondansetron (ZOFRAN ODT) 4 MG  disintegrating tablet Take 1 tablet (4 mg total) by mouth every 6 (six) hours as needed for nausea or vomiting. (Patient not taking: Reported on 05/28/2021) 20 tablet 0   potassium chloride (KLOR-CON) 10 MEQ tablet Take 1 tablet (10 mEq total) by mouth daily. (Patient not taking: Reported on 05/28/2021) 5 tablet 0   No current facility-administered medications for this visit.     PHYSICAL EXAMINATION: ECOG PERFORMANCE STATUS: 0 - Asymptomatic Vitals:   06/12/21 1402  BP: (!) 152/95  Pulse: 74  Resp: 16  Temp: 98.5 F (36.9 C)   Filed Weights   06/12/21 1402  Weight: 194 lb (88 kg)    Physical Exam Constitutional:      General: She is not in acute distress. HENT:     Head: Normocephalic and atraumatic.  Eyes:     General: No scleral icterus. Cardiovascular:     Rate and Rhythm: Normal rate and regular rhythm.     Heart sounds: Normal heart sounds.  Pulmonary:     Effort: Pulmonary effort is normal. No respiratory distress.     Breath sounds: No wheezing.  Abdominal:     General: Bowel sounds are normal. There is no distension.     Palpations: Abdomen is soft.  Musculoskeletal:        General: No deformity. Normal range of motion.     Cervical back: Normal range of motion and neck supple.  Skin:    General: Skin is warm and dry.     Findings: No erythema or rash.  Neurological:     Mental Status: She is alert and oriented to person, place, and time. Mental status is at baseline.     Cranial Nerves: No cranial nerve deficit.     Coordination: Coordination normal.  Psychiatric:        Mood and Affect: Mood normal.    LABORATORY DATA:  I have reviewed the data as listed Lab Results  Component Value Date   WBC 4.8 05/28/2021   HGB 11.4 (L) 05/28/2021   HCT 34.5 (L) 05/28/2021   MCV 88.4 05/28/2021   PLT 404.0 (H) 05/28/2021   Recent Labs    03/28/21 1212 05/10/21 1536 05/28/21 1617  NA 142 133* 139  K 3.8 3.1* 3.8  CL 102  102 102  CO2 24 25 27   GLUCOSE  80 103* 88  BUN 11 17 15   CREATININE 0.78 1.13* 0.93  CALCIUM 9.5 8.7* 9.5  GFRNONAA  --  >60  --   PROT  --  8.1 7.9  ALBUMIN  --  3.8 4.2  AST  --  36 26  ALT  --  19 79*  ALKPHOS  --  43 68  BILITOT  --  0.8 0.6    Iron/TIBC/Ferritin/ %Sat    Component Value Date/Time   IRON 9 (L) 07/14/2016 0739   TIBC 409 07/14/2016 0739   FERRITIN 4 (L) 07/14/2016 0739   IRONPCTSAT 2 (L) 07/14/2016 0739      RADIOGRAPHIC STUDIES: I have personally reviewed the radiological images as listed and agreed with the findings in the report. No results found.    ASSESSMENT & PLAN:  1. History of pulmonary embolism   2. History of iron deficiency   3. Current use of long term anticoagulation    Small pulmonary embolism in July 2020, unprovoked. I recommend patient to continue Eliquis 2.5 mg twice daily long-term. Given her young age at the diagnosis of unprovoked thrombosis, family history of DVT, I recommend patient to have hypercoagulable work-up.  We discussed that hypercoagulable work-up may provide additional information.  However in general even if she has a negative hypercoagulable work-up study, due to the initial event was unprovoked, she will benefit from long-term low-dose Eliquis for prophylaxis.  Patient feels that there is too much going on at this point.  She prefers to have all the blood work done at her next visit. Continue Eliquis 2.5 mg twice daily.  When she runs out current prescription, I will review.   History of iron deficiency anemia .  Check CBC, CMP, iron, TIBC, ferritin.  At the next visit. Orders Placed This Encounter  Procedures   CBC with Differential/Platelet    Standing Status:   Future    Standing Expiration Date:   06/12/2022   Comprehensive metabolic panel    Standing Status:   Future    Standing Expiration Date:   06/12/2022   Ferritin    Standing Status:   Future    Standing Expiration Date:   06/12/2022   Iron and TIBC ([facility] ONLY)    Standing Status:   Future    Standing Expiration Date:   06/12/2022   Factor 5 leiden    Standing Status:   Future    Standing Expiration Date:   06/12/2022   Prothrombin gene mutation    Standing Status:   Future    Standing Expiration Date:   06/12/2022   Antithrombin III    Standing Status:   Future    Standing Expiration Date:   06/12/2022   ANTIPHOSPHOLIPID SYNDROME PROF    Standing Status:   Future    Standing Expiration Date:   06/12/2022   Protein S, Antigen, Free    Standing Status:   Future    Standing Expiration Date:   06/12/2022   Protein C activity    Standing Status:   Future    Standing Expiration Date:   06/12/2022   Protein C, total    Standing Status:   Future    Standing Expiration Date:   06/12/2022    All questions were answered. The patient knows to call the clinic with any problems questions or concerns.   Return of visit: End of March 2023 Thank you for this kind  referral and the opportunity to participate in the care of this patient. A copy of today's note is routed to referring provider    Earlie Server, MD, PhD  06/12/2021

## 2021-06-12 NOTE — Progress Notes (Signed)
She got sick at thanksgiving and she was hurting all over. Got tested for flu and covid and it was neg. She hurt so much in her feet but other parts of her body. [facility] and did scan that showed that she has arthritis. She got diclofenac but it is not helping. She got amish cream and it helps some

## 2021-06-20 ENCOUNTER — Encounter: Payer: Self-pay | Admitting: Family Medicine

## 2021-06-20 ENCOUNTER — Ambulatory Visit (INDEPENDENT_AMBULATORY_CARE_PROVIDER_SITE_OTHER): Payer: 59 | Admitting: Family Medicine

## 2021-06-20 ENCOUNTER — Other Ambulatory Visit: Payer: Self-pay

## 2021-06-20 VITALS — BP 130/80 | Temp 98.7°F | Ht 62.0 in | Wt 190.8 lb

## 2021-06-20 DIAGNOSIS — M792 Neuralgia and neuritis, unspecified: Secondary | ICD-10-CM

## 2021-06-20 DIAGNOSIS — M79671 Pain in right foot: Secondary | ICD-10-CM

## 2021-06-20 DIAGNOSIS — M79672 Pain in left foot: Secondary | ICD-10-CM | POA: Diagnosis not present

## 2021-06-20 MED ORDER — GABAPENTIN 100 MG PO CAPS: 100.0000 mg | ORAL_CAPSULE | Freq: Three times a day (TID) | ORAL | 0 refills | Status: DC

## 2021-06-20 NOTE — Patient Instructions (Signed)
Nice to see you. Ready to get some lab work today to evaluate for a cause of your symptoms. Please try the gabapentin as prescribed.  Please monitor for drowsiness and if this occurs please let us know.  If you develop other side effects please let us know as well.

## 2021-06-20 NOTE — Progress Notes (Signed)
Tommi Rumps, MD Phone: [phone]  Mary Roberts is a 40 y.o. female who presents today for same-day visit.  Foot pain: The patient reports she had a fever before Thanksgiving and that resolved.  She subsequently developed foot and arm pain.  Her arm pain was attributed to carpal tunnel.  She saw orthopedics and they diagnosed her with arthritis and flatfeet.  She has been on a steroid as well as diclofenac with no benefit.  She notes the pain is 10/10.  It is daily.  She has shooting pain in the bottom of her feet.  She has stinging and burning sensations on the top of her toes.  She can only stand for a few minutes.  Notes there is also a throbbing discomfort.  No numbness.  No history of this.  She has an appointment with podiatry and neurology.  Notes the left foot bothers her more than the right foot.  Social History   Tobacco Use  Smoking Status Never  Smokeless Tobacco Never  Tobacco Comments   has smoked 1 pack in 1 week her life    Current Outpatient Medications on File Prior to Visit  Medication Sig Dispense Refill   AMLODIPINE BENZOATE PO Take by mouth daily.     apixaban (ELIQUIS) 2.5 MG TABS tablet Take 1 tablet (2.5 mg total) by mouth 2 (two) times daily. 180 tablet 1   aspirin-acetaminophen-caffeine (EXCEDRIN MIGRAINE) 250-250-65 MG tablet Take 2 tablets by mouth every 6 (six) hours as needed for headache.     diclofenac (VOLTAREN) 25 MG EC tablet Take 25 mg by mouth 2 (two) times daily.     LOSARTAN POTASSIUM PO Take by mouth daily.     NON FORMULARY Apply 1 Dose topically 2 (two) times daily with breakfast and lunch.     ondansetron (ZOFRAN ODT) 4 MG disintegrating tablet Take 1 tablet (4 mg total) by mouth every 6 (six) hours as needed for nausea or vomiting. 20 tablet 0   potassium chloride (KLOR-CON) 10 MEQ tablet Take 1 tablet (10 mEq total) by mouth daily. 5 tablet 0   pseudoephedrine-acetaminophen (TYLENOL SINUS) 30-500 MG TABS tablet Take 1 tablet by mouth  every 4 (four) hours as needed.     tiZANidine (ZANAFLEX) 4 MG tablet Take 1 tablet (4 mg total) by mouth at bedtime. 20 tablet 0   Vitamin D, Ergocalciferol, (DRISDOL) 1.25 MG (50000 UNIT) CAPS capsule Take 1 capsule (50,000 Units total) by mouth every 7 (seven) days. Recheck lab in 3 months 12 capsule 0   No current facility-administered medications on file prior to visit.     ROS see history of present illness  Objective  Physical Exam Vitals:   06/20/21 1407  BP: 130/80  Temp: 98.7 F (37.1 C)    BP Readings from Last 3 Encounters:  06/20/21 130/80  06/12/21 (!) 152/95  06/02/21 (!) 142/99   Wt Readings from Last 3 Encounters:  06/20/21 190 lb 12.8 oz (86.5 kg)  06/12/21 194 lb (88 kg)  05/28/21 190 lb 3.2 oz (86.3 kg)    Physical Exam Musculoskeletal:     Comments: Bilateral feet with no swelling, warmth, or erythema, she has 2+ DP and PT pulses bilaterally, there is no tenderness of the bony structures of her feet, there is no tenderness of the plantar fascia     Assessment/Plan: Please see individual problem list.  Problem List Items Addressed This Visit     Bilateral foot pain    The patient's foot  pain seems more consistent with a neurological issue such as a nerve impingement or neuropathy.  I would suspect if it is nerve impingement it is fairly distally given that she has no pain outside of her feet.  Discussed treatment with gabapentin.  Discussed the risk of drowsiness.  Advised to contact us if she has any excessive drowsiness or if it is not effective within the next week.  She will keep her appointments with podiatry and neurology.  We will check a B12 and A1c to evaluate for an underlying cause.      Other Visit Diagnoses     Nerve pain    -  Primary   Relevant Medications   gabapentin (NEURONTIN) 100 MG capsule   Other Relevant Orders   B12   HgB A1c       Return in about 1 month (around 07/21/2021) for CPE.  This visit occurred during the  SARS-CoV-2 public health emergency.  Safety protocols were in place, including screening questions prior to the visit, additional usage of staff PPE, and extensive cleaning of exam room while observing appropriate contact time as indicated for disinfecting solutions.    Tommi Rumps, MD No Name

## 2021-06-20 NOTE — Assessment & Plan Note (Signed)
The patient's foot pain seems more consistent with a neurological issue such as a nerve impingement or neuropathy.  I would suspect if it is nerve impingement it is fairly distally given that Mary Roberts has no pain outside of her feet.  Discussed treatment with gabapentin.  Discussed the risk of drowsiness.  Advised to contact us if Mary Roberts has any excessive drowsiness or if it is not effective within the next week.  Mary Roberts will keep her appointments with podiatry and neurology.  We will check a B12 and A1c to evaluate for an underlying cause.

## 2021-06-21 LAB — VITAMIN B12: Vitamin B-12: 355 pg/mL (ref 211–911)

## 2021-06-21 LAB — HEMOGLOBIN A1C: Hgb A1c MFr Bld: 5.7 % (ref 4.6–6.5)

## 2021-06-26 ENCOUNTER — Other Ambulatory Visit: Payer: Self-pay | Admitting: Family Medicine

## 2021-06-26 ENCOUNTER — Telehealth: Payer: Self-pay | Admitting: Adult Health

## 2021-06-26 NOTE — Telephone Encounter (Signed)
Patient called in stated that saw Dr. Caryl Bis for issue with her feet and he prescribe Gabapentin 100Mg  . Patient advised that Dr Caryl Bis asked for her to call back to let him know if medication and dose are working or if dose need to be increased. Patient stated medication is working but needs a higher dose. Please call patient @ [phone]

## 2021-06-27 ENCOUNTER — Other Ambulatory Visit: Payer: Self-pay | Admitting: Family

## 2021-06-27 DIAGNOSIS — M792 Neuralgia and neuritis, unspecified: Secondary | ICD-10-CM

## 2021-06-27 MED ORDER — GABAPENTIN 100 MG PO CAPS: 100.0000 mg | ORAL_CAPSULE | Freq: Two times a day (BID) | ORAL | 1 refills | Status: DC

## 2021-06-27 NOTE — Telephone Encounter (Signed)
Patient called in stated that saw Dr. Caryl Bis for issue with her feet and he prescribe Gabapentin 100Mg  . Patient advised that Dr Caryl Bis asked for her to call back to let him know if medication and dose are working or if dose need to be increased. Patient stated medication is working but needs a higher dose. Ridhima Golberg,cma

## 2021-06-29 ENCOUNTER — Other Ambulatory Visit: Payer: Self-pay | Admitting: Family

## 2021-06-29 ENCOUNTER — Telehealth: Payer: Self-pay

## 2021-06-29 DIAGNOSIS — I10 Essential (primary) hypertension: Secondary | ICD-10-CM

## 2021-06-29 MED ORDER — TIZANIDINE HCL 4 MG PO TABS: 4.0000 mg | ORAL_TABLET | Freq: Every day | ORAL | 0 refills | Status: AC

## 2021-06-29 NOTE — Telephone Encounter (Signed)
Patient called and said her pharmacy, [facility] filled her gabapentin (NEURONTIN) 100 MG capsule , they told her the new prescription was for 100mg  not 300mg .

## 2021-06-30 ENCOUNTER — Other Ambulatory Visit: Payer: Self-pay | Admitting: Family

## 2021-06-30 DIAGNOSIS — M792 Neuralgia and neuritis, unspecified: Secondary | ICD-10-CM

## 2021-06-30 MED ORDER — GABAPENTIN 300 MG PO CAPS: 300.0000 mg | ORAL_CAPSULE | Freq: Two times a day (BID) | ORAL | 1 refills | Status: DC

## 2021-07-01 ENCOUNTER — Other Ambulatory Visit: Payer: Self-pay | Admitting: Adult Health

## 2021-07-01 MED ORDER — GABAPENTIN 100 MG PO CAPS: 100.0000 mg | ORAL_CAPSULE | Freq: Three times a day (TID) | ORAL | 0 refills | Status: DC

## 2021-07-01 NOTE — Telephone Encounter (Signed)
Stark Bray,  Sorry I am not in office on Friday so missed this message until now.  I sent in Gabapentin 100 mg po TID for patient.  I do want to note though that Padonda NP had sent 300 mg BID.  Please verify with patient.   See below:  Medications Discontinued During This Encounter  Medication Reason   gabapentin (NEURONTIN) 300 MG capsule Completed Course    Meds ordered this encounter  Medications   gabapentin (NEURONTIN) 100 MG capsule    Sig: Take 1 capsule (100 mg total) by mouth 3 (three) times daily.    Dispense:  90 capsule    Refill:  0

## 2021-07-01 NOTE — Progress Notes (Signed)
Medications Discontinued During This Encounter  Medication Reason   gabapentin (NEURONTIN) 300 MG capsule Completed Course    Meds ordered this encounter  Medications   gabapentin (NEURONTIN) 100 MG capsule    Sig: Take 1 capsule (100 mg total) by mouth 3 (three) times daily.    Dispense:  90 capsule    Refill:  0

## 2021-07-02 NOTE — Telephone Encounter (Signed)
I called the pharmacy and they filled the 300 mg BID and discontinued the Rx that was sent today.  Shatasia Cutshaw,cma

## 2021-07-09 ENCOUNTER — Telehealth: Payer: Self-pay | Admitting: Adult Health

## 2021-07-09 ENCOUNTER — Other Ambulatory Visit: Payer: Self-pay | Admitting: Family

## 2021-07-09 NOTE — Telephone Encounter (Signed)
Pt called in regards to medication she needs refilled LOSARTAN POTASSIUM PO  Pt states she has been unable to get this medication all weekend due to the office being closed. Pt is in need of medication for her BP. Pt states she starts to feel like she may be getting a headache. When she goes to check her BP it is fine however its causing some concern for her. Pt uses [facility] on [address] and [place]. Pt declined access nurse.

## 2021-07-09 NOTE — Telephone Encounter (Signed)
Review Michelle's office note for in office visit and see if she mention continuing this medication.

## 2021-07-10 ENCOUNTER — Telehealth: Payer: Self-pay

## 2021-07-10 NOTE — Telephone Encounter (Signed)
*  STAT* If patient is at the pharmacy, call can be transferred to refill team.   1. Which medications need to be refilled? (please list name of each medication and dose if known) Losartan  2. Which pharmacy/location (including street and city if local pharmacy) is medication to be sent to?[place]  3. Do they need a 30 day or 90 day supply? [place]

## 2021-07-11 NOTE — Telephone Encounter (Signed)
1) Please see telephone note 03/29/21:  Result note: Marrianne Mood D, PA-C  P Cv Div [place] Triage Stable kidney function  Will transition her from Losartan to Valsartan as discussed in clinic.  MyChart msg sent that I will call her today to discuss this change again  Repeat BMET in 1 month    Per Jacquelyn: If agreeable,  --Discontinue Losartan 75mg   --Discontinue amlodipine 10mg  --Start valsartan 320mg -amlodipine 10mg  (combination medication). --Repeat BMET 1 month  2) Please also see office note from pt's PCP 05/28/21:  She also had concerns she has been taking losartan- amlodipine for hypertension. She was also still taking her amlodipine plain tablet with this. She became dizzy. SHE HAS SWITCHED HERSELF BACK TO AMLODIPINE ONLY AND FEELS BETTER.   Amlodipine-Valsartan was discontinued by Laverna Peace, FNP on 05/28/21.   I called and spoke with the pt.   She states that she did discuss with PCP that she could not tolerate the combination pill Amlodipine-Valsartan d/t vertigo type side effects.   Pt states that she has continued to take Amlodipine and Losartan separately. And now is out of Losartan.   Pt has reached out to PCP but has not had response regarding refill.   Advised pt we can see her for office visit to discuss medication management.   Pt agrees and is scheduled this Friday 07/13/21 at 3:35 PM with Cadence Kathlen Mody, PA-C.

## 2021-07-11 NOTE — Telephone Encounter (Signed)
Please contact patient regarding her refill on  medication Losartan Potassium dose and with whom changed the medication from Valsartan 320 mg -amlodipine 10 mg. The patient is out of her medications and needs some clarification.

## 2021-07-13 ENCOUNTER — Other Ambulatory Visit: Payer: Self-pay

## 2021-07-13 ENCOUNTER — Ambulatory Visit: Payer: 59 | Admitting: Nurse Practitioner

## 2021-07-13 ENCOUNTER — Ambulatory Visit (INDEPENDENT_AMBULATORY_CARE_PROVIDER_SITE_OTHER): Payer: 59 | Admitting: Medical

## 2021-07-13 ENCOUNTER — Encounter: Payer: Self-pay | Admitting: Medical

## 2021-07-13 VITALS — BP 138/84 | HR 104 | Ht 62.5 in | Wt 192.4 lb

## 2021-07-13 DIAGNOSIS — Z86711 Personal history of pulmonary embolism: Secondary | ICD-10-CM

## 2021-07-13 DIAGNOSIS — Z7901 Long term (current) use of anticoagulants: Secondary | ICD-10-CM | POA: Diagnosis not present

## 2021-07-13 DIAGNOSIS — I10 Essential (primary) hypertension: Secondary | ICD-10-CM

## 2021-07-13 DIAGNOSIS — Z79899 Other long term (current) drug therapy: Secondary | ICD-10-CM | POA: Diagnosis not present

## 2021-07-13 MED ORDER — LOSARTAN POTASSIUM 50 MG PO TABS: 75.0000 mg | ORAL_TABLET | Freq: Every day | ORAL | 3 refills | Status: DC

## 2021-07-13 NOTE — Patient Instructions (Signed)
Medication Instructions:  Your physician recommends that you continue on your current medications as directed. Please refer to the Current Medication list given to you today.   *If you need a refill on your cardiac medications before your next appointment, please call your pharmacy*   Lab Work: None ordered  If you have labs (blood work) drawn today and your tests are completely normal, you will receive your results only by: [facility] [place] (if you have MyChart) OR A paper copy in the mail If you have any lab test that is abnormal or we need to change your treatment, we will call you to review the results.   Testing/Procedures: None ordered   Follow-Up: At [facility], you and your health needs are our priority.  As part of our continuing mission to provide you with exceptional heart care, we have created designated Provider Care Teams.  These Care Teams include your primary Cardiologist (physician) and Advanced Practice Providers (APPs -  Physician Assistants and Nurse Practitioners) who all work together to provide you with the care you need, when you need it.  We recommend signing up for the patient portal called "MyChart".  Sign up information is provided on this After Visit Summary.  MyChart is used to connect with patients for Virtual Visits (Telemedicine).  Patients are able to view lab/test results, encounter notes, upcoming appointments, etc.  Non-urgent messages can be sent to your provider as well.   To learn more about what you can do with MyChart, go to [email or web address].    Your next appointment:    Your physician wants you to follow-up in: 1 year.   You will receive a reminder letter in the mail two months in advance. If you don't receive a letter, please call our office to schedule the follow-up appointment.   The format for your next appointment:   In Person  Provider:   You may see Nelva Bush, MD or one of the following Advanced Practice Providers  on your designated Care Team:   Murray Hodgkins, NP Christell Faith, PA-C Cadence Kathlen Mody, PA-C :1}    Other Instructions N/A

## 2021-07-13 NOTE — Progress Notes (Signed)
Cardiology Office Note:    Date:  07/13/2021   ID:  Mary Roberts, DOB 27-Jun-1980, MRN [id number]  PCP:  Doreen Beam, FNP  [facility] Cardiologist:  Nelva Bush, MD  [facility] Electrophysiologist:  None   Referring MD: Sharmon Leyden*   Chief Complaint: 1 year follow-up  History of Present Illness:    Mary Roberts is a 41 y.o. female with a hx of PE, HTN, preeclampsia, sz disorder, and who presents today for follow-up of HTN.  She has previous intolerance of HCTZ and lisinopril.  In 2020, she was seen in the ED with uncontrolled hypertension, chest pain, dyspnea.  CT showed probable nonocclusive PE with segmental right upper lobe branch vessel.  Lower extremity duplex negative for DVT.  She was admitted for BP management and anticoagulation.  Echo with normal LVEF, DD, severe LVH.  When seen 12/2018, medications consolidated.  02/2019 visit showed stable BP with hydralazine reduced to 25 mg twice daily.  She was admitted 02/2019 with abnormal uterine bleeding and severe anemia with a hemoglobin of 6.5 requiring 2 units PRBC.  She underwent total laparoscopic hysterectomy with bilateral salpingectomy 06/2019.  When seen by Dr. Saunders Revel 10/2019, chlorthalidone was discontinued.  She was referred to hematology for hypercoagulable work-up in the setting of unprovoked PE.  When seen by Dr. Tasia Catchings of oncology 11/2019, it was recommended she hold Eliquis for 3 days and undergo hypercoagulable work-up and changed to Eliquis 2.5 mg twice daily.  She had not completed this referral as of 07/2020.  When last seen in office, she reported BP readings in the yellow range on her home BP cuff.  She indicated preference for once daily BP medications.  She had occasional headache over her left eye, associated with elevated BP.  Last seen 03/28/21 and reported left lower extremity pain, she had run ou of her Eliquis. BP elevated. Lower extremity edema was not consistent with prior DVT. Labs were  stable and she was started on valsartan -amlodipine pill.   Today, patient is overall doing well. She needs refill of Losartan 75mg  1.5 tab daily. She siad she did not tolerate the combo pill due to vertigo so it was switched back to Losartan and amlodipine. She has no chest pain, SOB, LLE, orthopnea, pdn, palpitations. She works full time and has 3 kids, she is very busy. Does rare exercise. Diet could be better. BP mildly elevated since she has been off Losartan for a week. She has leg pain that has improved with gabapentin. She is taking Eliquis 2.5mg BID and follows with hematology. She reports infrequent BRBPR, suspect hemorrhoids. Recent labs reviewed.   Past Medical History:  Diagnosis Date   Anemia    Anxiety    panic attacks   Arthritis    Carpal tunnel syndrome, bilateral    Carpal tunnel syndrome, bilateral    Chlamydia    Epilepsy seizure, nonconvulsive, generalized ([place])    Headache(784.0)    migraines   History of C-section    Hypertension    Hypertensive heart disease    Infection    UTI   LVH (left ventricular hypertrophy)    a. 12/2018 Echo: EF 60-65%, sev inc LV wall thickness. Diast dysfxn. Triv AI.   Menorrhagia    Migraines    Pregnancy induced hypertension    with first   Pulmonary embolism ([place])    a. 12/2018 CTA Chest (pleuritic c/p): probable small, nonocclusive PE w/in subsegmental RUL branch vessel. Artifact noted as  well.  Eliquis initiatied.   Seizures ([place])    No meds, off since in mid to late 20's   UTI (urinary tract infection)     Past Surgical History:  Procedure Laterality Date   ABDOMINAL HYSTERECTOMY     CESAREAN SECTION     CYSTOSCOPY N/A 06/29/2019   Procedure: CYSTOSCOPY;  Surgeon: Will Bonnet, MD;  Location: [facility];  Service: Gynecology;  Laterality: N/A;   MOUTH SURGERY     TOTAL LAPAROSCOPIC HYSTERECTOMY WITH SALPINGECTOMY N/A 06/29/2019   Procedure: TOTAL LAPAROSCOPIC HYSTERECTOMY WITH SALPINGECTOMY;  Surgeon: Will Bonnet,  MD;  Location: [facility] OR[facility];  Service: Gynecology;  Laterality: N/A;   TUBAL LIGATION N/A 10/30/2013   Procedure: POST PARTUM TUBAL LIGATION;  Surgeon: [name], MD;  Location: [facility];  Service: Gynecology;  Laterality: N/A;    Current Medications: Current Meds  Medication Sig   amLODipine (NORVASC) 10 MG tablet Take 10 mg by mouth daily.   apixaban (ELIQUIS) 2.5 MG TABS tablet Take 1 tablet (2.5 mg total) by mouth 2 (two) times daily.   aspirin-acetaminophen-caffeine (EXCEDRIN MIGRAINE) 250-250-65 MG tablet Take 2 tablets by mouth every 6 (six) hours as needed for headache.   gabapentin (NEURONTIN) 300 MG capsule Take 300 mg by mouth 2 (two) times daily.   NON FORMULARY Apply 1 Dose topically 2 (two) times daily with breakfast and lunch.   ondansetron (ZOFRAN ODT) 4 MG disintegrating tablet Take 1 tablet (4 mg total) by mouth every 6 (six) hours as needed for nausea or vomiting.   potassium chloride (KLOR-CON) 10 MEQ tablet Take 1 tablet (10 mEq total) by mouth daily.   pseudoephedrine-acetaminophen (TYLENOL SINUS) 30-500 MG TABS tablet Take 1 tablet by mouth every 4 (four) hours as needed.   tiZANidine (ZANAFLEX) 4 MG tablet Take 1 tablet (4 mg total) by mouth at bedtime.   Vitamin D, Ergocalciferol, (DRISDOL) 1.25 MG (50000 UNIT) CAPS capsule Take 1 capsule (50,000 Units total) by mouth every 7 (seven) days. Recheck lab in 3 months   [DISCONTINUED] losartan (COZAAR) 50 MG tablet Take 75 mg by mouth daily.     Allergies:   Amoxicillin and Lisinopril   Social History   Socioeconomic History   Marital status: Single    Spouse name: Not on file   Number of children: Not on file   Years of education: Not on file   Highest education level: Not on file  Occupational History   Not on file  Tobacco Use   Smoking status: Never   Smokeless tobacco: Never   Tobacco comments:    has smoked 1 pack in 1 week her life  Vaping Use   Vaping Use: Never used  Substance and Sexual Activity    Alcohol use: Yes    Comment: socially   Drug use: No   Sexual activity: Yes    Birth control/protection: Surgical  Other Topics Concern   Not on file  Social History Narrative   Teacher- The growing years      Single- 3 kids ages 12-06-15   Social Determinants of Health   Financial Resource Strain: Not on file  Food Insecurity: Not on file  Transportation Needs: Not on file  Physical Activity: Not on file  Stress: Not on file  Social Connections: Not on file     Family History: The patient's family history includes Arthritis in her mother; Diabetes in her mother; Heart disease in her mother; Hypertension in her mother; Seizures in her brother  and mother; Stroke in her mother. There is no history of Hearing loss.  ROS:   Please see the history of present illness.     All other systems reviewed and are negative.  EKGs/Labs/Other Studies Reviewed:    The following studies were reviewed today:  Echo 12/2018 1. The left ventricle has normal systolic function with an ejection  fraction of 60-65%. The cavity size was normal. There is severely  increased left ventricular wall thickness. Left ventricular diastolic  Doppler parameters are consistent with impaired  relaxation. Elevated left ventricular end-diastolic pressure No evidence  of left ventricular regional wall motion abnormalities.   2. The right ventricle has normal systolic function. The cavity was  normal. There is no increase in right ventricular wall thickness.   3. The mitral valve is grossly normal.   4. The tricuspid valve is grossly normal.   5. The aortic valve is grossly normal. Mild thickening of the aortic  valve. Mild calcification of the aortic valve. Aortic valve regurgitation  is trivial by color flow Doppler. No stenosis of the aortic valve.   6. The aortic root is normal in size and structure.   EKG:  EKG is  ordered today.  The ekg ordered today demonstrates NSR, 98bpm, nonspecific ST  changes  Recent Labs: 05/28/2021: ALT 79; BUN 15; Creatinine, Ser 0.93; Hemoglobin 11.4; Platelets 404.0; Potassium 3.8; Sodium 139; TSH 0.82  Recent Lipid Panel    Component Value Date/Time   CHOL 186 03/17/2020 1215   TRIG 97.0 03/17/2020 1215   HDL 39.60 03/17/2020 1215   CHOLHDL 5 03/17/2020 1215   VLDL 19.4 03/17/2020 1215   LDLCALC 127 (H) 03/17/2020 1215      Physical Exam:    VS:  BP 138/84 (BP Location: Left Arm, Patient Position: Sitting, Cuff Size: Normal)    Pulse (!) 104    Ht 5' 2.5" (1.588 m)    Wt 192 lb 6 oz (87.3 kg)    LMP 07/06/2018    SpO2 99%    BMI 34.63 kg/m     Wt Readings from Last 3 Encounters:  07/13/21 192 lb 6 oz (87.3 kg)  06/20/21 190 lb 12.8 oz (86.5 kg)  06/12/21 194 lb (88 kg)     GEN:  Well nourished, well developed in no acute distress HEENT: Normal NECK: No JVD; No carotid bruits LYMPHATICS: No lymphadenopathy CARDIAC: RRR, no murmurs, rubs, gallops RESPIRATORY:  Clear to auscultation without rales, wheezing or rhonchi  ABDOMEN: Soft, non-tender, non-distended MUSCULOSKELETAL:  No edema; No deformity  SKIN: Warm and dry NEUROLOGIC:  Alert and oriented x 3 PSYCHIATRIC:  Normal affect   ASSESSMENT:    1. Medication management   2. Primary hypertension   3. Essential hypertension   4. On apixaban therapy   5. History of pulmonary embolism    PLAN:    In order of problems listed above:  HTN BP mildly elevated, but has been out of Losartan for a week. We will refill Losartan 150mg  daily. Continue amlodipine. Lifestyle changes encouraged.   Lower extremity pain Suspect nerve pain vs plantar fasciitis. She is following with PCP and ortho for this. Gabapentin improves the pain.   H/o unprovoked PE She will continue Eliquis 2.5mg BID indefinitely. She is following with hematology.   Disposition: Follow up in 1 year(s) with MD/APP    Signed, Anne-Marie Genson Ninfa Meeker, PA-C  07/13/2021 4:08 PM    [facility]

## 2021-07-13 NOTE — Telephone Encounter (Signed)
Please send refill for Losartan 50mg  po qd #90 it is on her medication list  Er if any worsening symptoms.

## 2021-07-31 ENCOUNTER — Ambulatory Visit: Payer: 59 | Admitting: Nurse Practitioner

## 2021-08-01 ENCOUNTER — Ambulatory Visit: Payer: 59 | Admitting: Family Medicine

## 2021-09-11 ENCOUNTER — Other Ambulatory Visit: Payer: Self-pay | Admitting: *Deleted

## 2021-09-11 DIAGNOSIS — Z8639 Personal history of other endocrine, nutritional and metabolic disease: Secondary | ICD-10-CM

## 2021-09-13 ENCOUNTER — Telehealth: Payer: Self-pay | Admitting: Adult Health

## 2021-09-13 ENCOUNTER — Other Ambulatory Visit: Payer: Self-pay | Admitting: Family Medicine

## 2021-09-13 NOTE — Telephone Encounter (Signed)
Rejection Reason - Patient was No Show" ?Mary Roberts said on Sep 13, 2021 7:59 AM ? ?Msg from [facility] neurology ?

## 2021-09-18 ENCOUNTER — Inpatient Hospital Stay: Payer: 59 | Attending: Oncology

## 2021-09-25 ENCOUNTER — Inpatient Hospital Stay: Payer: 59 | Attending: Oncology | Admitting: Oncology

## 2021-10-22 ENCOUNTER — Other Ambulatory Visit: Payer: Self-pay

## 2021-10-22 MED ORDER — GABAPENTIN 300 MG PO CAPS: ORAL_CAPSULE | ORAL | 0 refills | Status: DC

## 2021-10-30 ENCOUNTER — Ambulatory Visit: Payer: 59 | Admitting: Oncology

## 2021-10-31 ENCOUNTER — Inpatient Hospital Stay: Payer: 59 | Attending: Oncology

## 2021-10-31 DIAGNOSIS — Z87440 Personal history of urinary (tract) infections: Secondary | ICD-10-CM | POA: Diagnosis not present

## 2021-10-31 DIAGNOSIS — Z880 Allergy status to penicillin: Secondary | ICD-10-CM | POA: Insufficient documentation

## 2021-10-31 DIAGNOSIS — M2550 Pain in unspecified joint: Secondary | ICD-10-CM | POA: Insufficient documentation

## 2021-10-31 DIAGNOSIS — Z888 Allergy status to other drugs, medicaments and biological substances status: Secondary | ICD-10-CM | POA: Insufficient documentation

## 2021-10-31 DIAGNOSIS — D509 Iron deficiency anemia, unspecified: Secondary | ICD-10-CM | POA: Diagnosis not present

## 2021-10-31 DIAGNOSIS — Z8261 Family history of arthritis: Secondary | ICD-10-CM | POA: Diagnosis not present

## 2021-10-31 DIAGNOSIS — Z79899 Other long term (current) drug therapy: Secondary | ICD-10-CM | POA: Insufficient documentation

## 2021-10-31 DIAGNOSIS — Z8249 Family history of ischemic heart disease and other diseases of the circulatory system: Secondary | ICD-10-CM | POA: Insufficient documentation

## 2021-10-31 DIAGNOSIS — Z90710 Acquired absence of both cervix and uterus: Secondary | ICD-10-CM | POA: Insufficient documentation

## 2021-10-31 DIAGNOSIS — I10 Essential (primary) hypertension: Secondary | ICD-10-CM | POA: Insufficient documentation

## 2021-10-31 DIAGNOSIS — D6859 Other primary thrombophilia: Secondary | ICD-10-CM | POA: Insufficient documentation

## 2021-10-31 DIAGNOSIS — Z823 Family history of stroke: Secondary | ICD-10-CM | POA: Insufficient documentation

## 2021-10-31 DIAGNOSIS — Z86711 Personal history of pulmonary embolism: Secondary | ICD-10-CM | POA: Diagnosis present

## 2021-10-31 DIAGNOSIS — Z820 Family history of epilepsy and other diseases of the nervous system: Secondary | ICD-10-CM | POA: Diagnosis not present

## 2021-10-31 DIAGNOSIS — Z833 Family history of diabetes mellitus: Secondary | ICD-10-CM | POA: Diagnosis not present

## 2021-10-31 DIAGNOSIS — Z9079 Acquired absence of other genital organ(s): Secondary | ICD-10-CM | POA: Diagnosis not present

## 2021-10-31 DIAGNOSIS — Z7901 Long term (current) use of anticoagulants: Secondary | ICD-10-CM | POA: Insufficient documentation

## 2021-10-31 DIAGNOSIS — Z8639 Personal history of other endocrine, nutritional and metabolic disease: Secondary | ICD-10-CM

## 2021-10-31 LAB — IRON AND TIBC
Iron: 70 ug/dL (ref 28–170)
Saturation Ratios: 23 % (ref 10.4–31.8)
TIBC: 307 ug/dL (ref 250–450)
UIBC: 237 ug/dL

## 2021-10-31 LAB — COMPREHENSIVE METABOLIC PANEL
ALT: 11 U/L (ref 0–44)
AST: 16 U/L (ref 15–41)
Albumin: 4 g/dL (ref 3.5–5.0)
Alkaline Phosphatase: 50 U/L (ref 38–126)
Anion gap: 8 (ref 5–15)
BUN: 18 mg/dL (ref 6–20)
CO2: 25 mmol/L (ref 22–32)
Calcium: 9.3 mg/dL (ref 8.9–10.3)
Chloride: 103 mmol/L (ref 98–111)
Creatinine, Ser: 1.02 mg/dL — ABNORMAL HIGH (ref 0.44–1.00)
GFR, Estimated: >60 mL/min (ref >60)
Glucose, Bld: 98 mg/dL (ref 70–99)
Potassium: 3.3 mmol/L — ABNORMAL LOW (ref 3.5–5.1)
Sodium: 136 mmol/L (ref 135–145)
Total Bilirubin: 0.6 mg/dL (ref 0.3–1.2)
Total Protein: 8 g/dL (ref 6.5–8.1)

## 2021-10-31 LAB — CBC WITH DIFFERENTIAL/PLATELET
Abs Immature Granulocytes: 0.03 10*3/uL (ref 0.00–0.07)
Basophils Absolute: 0 10*3/uL (ref 0.0–0.1)
Basophils Relative: 0 %
Eosinophils Absolute: 0.1 10*3/uL (ref 0.0–0.5)
Eosinophils Relative: 1 %
HCT: 34.6 % — ABNORMAL LOW (ref 36.0–46.0)
Hemoglobin: 11.3 g/dL — ABNORMAL LOW (ref 12.0–15.0)
Immature Granulocytes: 1 %
Lymphocytes Relative: 24 %
Lymphs Abs: 1.5 10*3/uL (ref 0.7–4.0)
MCH: 28.5 pg (ref 26.0–34.0)
MCHC: 32.7 g/dL (ref 30.0–36.0)
MCV: 87.2 fL (ref 80.0–100.0)
Monocytes Absolute: 0.5 10*3/uL (ref 0.1–1.0)
Monocytes Relative: 7 %
Neutro Abs: 4.2 10*3/uL (ref 1.7–7.7)
Neutrophils Relative %: 67 %
Platelets: 336 10*3/uL (ref 150–400)
RBC: 3.97 MIL/uL (ref 3.87–5.11)
RDW: 13.3 % (ref 11.5–15.5)
WBC: 6.3 10*3/uL (ref 4.0–10.5)
nRBC: 0 % (ref 0.0–0.2)

## 2021-10-31 LAB — ANTITHROMBIN III: AntiThromb III Func: 105 % (ref 75–120)

## 2021-10-31 LAB — FERRITIN: Ferritin: 54 ng/mL (ref 11–307)

## 2021-11-01 LAB — PROTEIN C, TOTAL: Protein C, Total: 97 % (ref 60–150)

## 2021-11-02 LAB — ANTIPHOSPHOLIPID SYNDROME PROF
Anticardiolipin IgG: <9 GPL U/mL (ref 0–14)
Anticardiolipin IgM: <9 MPL U/mL (ref 0–12)
DRVVT: 40.1 s (ref 0.0–47.0)
PTT Lupus Anticoagulant: 38.3 s (ref 0.0–43.5)

## 2021-11-02 LAB — PROTEIN S, ANTIGEN, FREE: Protein S Ag, Free: 58 % — ABNORMAL LOW (ref 61–136)

## 2021-11-02 LAB — PROTEIN C ACTIVITY: Protein C Activity: 121 % (ref 73–180)

## 2021-11-07 LAB — FACTOR 5 LEIDEN

## 2021-11-07 LAB — PROTHROMBIN GENE MUTATION

## 2021-11-08 ENCOUNTER — Inpatient Hospital Stay (HOSPITAL_BASED_OUTPATIENT_CLINIC_OR_DEPARTMENT_OTHER): Payer: 59 | Admitting: Oncology

## 2021-11-08 ENCOUNTER — Encounter: Payer: Self-pay | Admitting: Oncology

## 2021-11-08 ENCOUNTER — Ambulatory Visit (INDEPENDENT_AMBULATORY_CARE_PROVIDER_SITE_OTHER): Payer: 59 | Admitting: Internal Medicine

## 2021-11-08 ENCOUNTER — Encounter: Payer: Self-pay | Admitting: Internal Medicine

## 2021-11-08 VITALS — BP 120/80 | HR 83 | Temp 98.5°F | Resp 14 | Ht 62.5 in | Wt 202.6 lb

## 2021-11-08 VITALS — BP 139/88 | HR 91 | Temp 98.1°F | Resp 18 | Wt 203.8 lb

## 2021-11-08 DIAGNOSIS — K649 Unspecified hemorrhoids: Secondary | ICD-10-CM | POA: Insufficient documentation

## 2021-11-08 DIAGNOSIS — Z8639 Personal history of other endocrine, nutritional and metabolic disease: Secondary | ICD-10-CM

## 2021-11-08 DIAGNOSIS — E559 Vitamin D deficiency, unspecified: Secondary | ICD-10-CM

## 2021-11-08 DIAGNOSIS — Z7901 Long term (current) use of anticoagulants: Secondary | ICD-10-CM

## 2021-11-08 DIAGNOSIS — Z86711 Personal history of pulmonary embolism: Secondary | ICD-10-CM | POA: Diagnosis not present

## 2021-11-08 DIAGNOSIS — E876 Hypokalemia: Secondary | ICD-10-CM | POA: Diagnosis not present

## 2021-11-08 DIAGNOSIS — K625 Hemorrhage of anus and rectum: Secondary | ICD-10-CM | POA: Diagnosis not present

## 2021-11-08 DIAGNOSIS — D6859 Other primary thrombophilia: Secondary | ICD-10-CM

## 2021-11-08 DIAGNOSIS — Z1231 Encounter for screening mammogram for malignant neoplasm of breast: Secondary | ICD-10-CM

## 2021-11-08 LAB — HEMOCCULT GUIAC POC 1CARD (OFFICE): Fecal Occult Blood, POC: NEGATIVE

## 2021-11-08 MED ORDER — GABAPENTIN 300 MG PO CAPS: ORAL_CAPSULE | ORAL | 3 refills | Status: AC

## 2021-11-08 MED ORDER — CHOLECALCIFEROL 1.25 MG (50000 UT) PO CAPS: 50000.0000 [IU] | ORAL_CAPSULE | ORAL | 1 refills | Status: AC

## 2021-11-08 MED ORDER — HYDROCORTISONE (PERIANAL) 2.5 % EX CREA: 1.0000 "application " | TOPICAL_CREAM | Freq: Two times a day (BID) | CUTANEOUS | 11 refills | Status: AC

## 2021-11-08 MED ORDER — APIXABAN 2.5 MG PO TABS: 2.5000 mg | ORAL_TABLET | Freq: Two times a day (BID) | ORAL | 1 refills | Status: AC

## 2021-11-08 MED ORDER — POTASSIUM CHLORIDE ER 10 MEQ PO CPCR
10.0000 meq | ORAL_CAPSULE | Freq: Every day | ORAL | 3 refills | Status: AC
Start: 2021-11-08 — End: ?

## 2021-11-08 NOTE — Progress Notes (Signed)
Hematology/Oncology Consult note  [phone] [phone]   Patient Care Team: McLean-Scocuzza, Nino Glow, MD as PCP - General (Internal Medicine) End, Harrell Gave, MD as PCP - Cardiology (Cardiology)  REFERRING PROVIDER: Doreen Beam, F*  CHIEF COMPLAINTS/REASON FOR VISIT:  History of pulmonary embolism  HISTORY OF PRESENTING ILLNESS:   Mary Roberts is a  41 y.o.  female with PMH listed below was seen in consultation at the request of  Flinchum, Kelby Aline, F*  for evaluation of pulmonary embolism She presented to ED on 12/28/2018 due to heart palpitation and uncontrolled hypertension.  Patient was found to be in hypertension emergency with blood pressure exceeding 200.  She also has dyspnea associated with palpitation 01/07/2019 CT angiogram chest PE protocol showed probable small nonocclusive embolus within subsegmental right upper lobe branch vessel, though evaluation was limited due to artifact arising from dense contrast within the SVC.  7/172020 US venous lower extremity, no DVT.  No provoking factors were found.  No hospitalization, trauma, OCP use, long distance car ride or flights. Patient was started on heparin drip which was switched to Eliquis at discharge. Patient was referred to hematology for further evaluation and management which she did not establish care and to now. He was recently seen by cardiologist Dr. Saunders Revel and expressed her wishes of come off anticoagulation.  She was referred by cardiology to hematology for further discussion. Patient has been on Eliquis for close to a year now.  She has had excessive uterus bleeding status post hysterectomy in January 2021.  Today she reports feeling well.  Denies any chest pain, shortness of breath, lower extremity swelling. Family history is positive for mother being on blood thinner at some point for blood clots.  She also has a history of seizures currently not on any medication.  She has been off  seizure medication since late 20s.   Patient was seen by me once in June 2021 for unprovoked pulmonary embolism.  PE was diagnosed in July 2020 and patient was recommended to continue Eliquis for anticoagulation.  Patient had hysterectomy due to excessive uterus bleeding in January 2021.  Patient was recommended to have hypercoagulable work-up done which she did not do.  She did not follow-up as well.  Patient was recommended to reestablish care with hematology for further discussion of her anticoagulation. Per patient, she has continued on Eliquis 5 mg twice daily until cardiology switched her to 2.5 mg twice daily.  Then she ran out of the 2.5 mg supply for about a month, November 2022 approximately.  She then took aspirin and then recently resumed on Eliquis 2.5 mg twice daily a few weeks ago.   INTERVAL HISTORY Mary Roberts is a 41 y.o. female who has above history reviewed by me today presents for follow up visit for history of pulmonary embolism.   Patient reports feeling well today.  Patient is on Eliquis 2.5 mg twice daily.  She denies shortness of breath/chest pain.    Review of Systems  Constitutional:  Negative for appetite change, chills, fatigue and fever.  HENT:   Negative for hearing loss and voice change.   Eyes:  Negative for eye problems.  Respiratory:  Negative for chest tightness and cough.   Cardiovascular:  Negative for chest pain.  Gastrointestinal:  Negative for abdominal distention, abdominal pain and blood in stool.  Endocrine: Negative for hot flashes.  Genitourinary:  Negative for difficulty urinating and frequency.   Musculoskeletal:  Positive for arthralgias.  Skin:  Negative for itching and rash.  Neurological:  Negative for extremity weakness.  Hematological:  Negative for adenopathy.  Psychiatric/Behavioral:  Negative for confusion.    MEDICAL HISTORY:  Past Medical History:  Diagnosis Date   Anemia    Anxiety    panic attacks   Arthritis     Carpal tunnel syndrome, bilateral    Carpal tunnel syndrome, bilateral    Chlamydia    Epilepsy seizure, nonconvulsive, generalized ([place])    Headache(784.0)    migraines   History of C-section    Hypertension    Hypertensive heart disease    Infection    UTI   LVH (left ventricular hypertrophy)    a. 12/2018 Echo: EF 60-65%, sev inc LV wall thickness. Diast dysfxn. Triv AI.   Menorrhagia    Migraines    Pregnancy induced hypertension    with first   Pulmonary embolism ([place])    a. 12/2018 CTA Chest (pleuritic c/p): probable small, nonocclusive PE w/in subsegmental RUL branch vessel. Artifact noted as well.  Eliquis initiatied.   Seizures ([place])    No meds, off since in mid to late 20's   UTI (urinary tract infection)     SURGICAL HISTORY: Past Surgical History:  Procedure Laterality Date   ABDOMINAL HYSTERECTOMY     CESAREAN SECTION     CYSTOSCOPY N/A 06/29/2019   Procedure: CYSTOSCOPY;  Surgeon: Will Bonnet, MD;  Location: [facility];  Service: Gynecology;  Laterality: N/A;   MOUTH SURGERY     TOTAL LAPAROSCOPIC HYSTERECTOMY WITH SALPINGECTOMY N/A 06/29/2019   Procedure: TOTAL LAPAROSCOPIC HYSTERECTOMY WITH SALPINGECTOMY;  Surgeon: Will Bonnet, MD;  Location: [facility];  Service: Gynecology;  Laterality: N/A;   TUBAL LIGATION N/A 10/30/2013   Procedure: POST PARTUM TUBAL LIGATION;  Surgeon: [name], MD;  Location: [facility];  Service: Gynecology;  Laterality: N/A;    SOCIAL HISTORY: Social History   Socioeconomic History   Marital status: Single    Spouse name: Not on file   Number of children: Not on file   Years of education: Not on file   Highest education level: Not on file  Occupational History   Not on file  Tobacco Use   Smoking status: Never   Smokeless tobacco: Never   Tobacco comments:    has smoked 1 pack in 1 week her life  Vaping Use   Vaping Use: Never used  Substance and Sexual Activity   Alcohol use: Yes    Comment: socially   Drug use:  No   Sexual activity: Yes    Birth control/protection: Surgical  Other Topics Concern   Not on file  Social History Narrative   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  MEDICATIONS:  Current Outpatient Medications  Medication  Sig Dispense Refill   amLODipine (NORVASC) 10 MG tablet Take 10 mg by mouth daily.     Cholecalciferol 1.25 MG (50000 UT) capsule Take 1 capsule (50,000 Units total) by mouth once a week. D3 13 capsule 1   gabapentin (NEURONTIN) 300 MG capsule TAKE 1 CAPSULE(300 MG) in am and 600 mg in pm 270 capsule 3   losartan (COZAAR) 50 MG tablet Take 1.5 tablets (75 mg total) by mouth daily. 135 tablet 3   potassium chloride (MICRO-K) 10 MEQ CR capsule Take 1 capsule (10 mEq total) by mouth daily. 90 capsule 3   tiZANidine (ZANAFLEX) 4 MG tablet Take 1 tablet (4 mg total) by mouth at bedtime. 20 tablet 0   apixaban (ELIQUIS) 2.5 MG TABS tablet Take 1 tablet (2.5 mg total) by mouth 2 (two) times daily. 180 tablet 1   aspirin-acetaminophen-caffeine (EXCEDRIN MIGRAINE) 250-250-65 MG tablet Take 2 tablets by mouth every 6 (six) hours as needed for headache. (Patient not taking: Reported on 11/08/2021)     hydrocortisone (ANUSOL-HC) 2.5 % rectal cream Place 1 application. rectally 2 (two) times daily. (Patient not taking: Reported on 11/08/2021) 30 g 11   NON FORMULARY Apply 1 Dose topically 2 (two)  times daily with breakfast and lunch. (Patient not taking: Reported on 11/08/2021)     No current facility-administered medications for this visit.     PHYSICAL EXAMINATION: ECOG PERFORMANCE STATUS: 0 - Asymptomatic Vitals:   11/08/21 1343  BP: 139/88  Pulse: 91  Resp: 18  Temp: 98.1 F (36.7 C)   Filed Weights   11/08/21 1343  Weight: 203 lb 12.8 oz (92.4 kg)    Physical Exam Constitutional:      General: She is not in acute distress. HENT:     Head: Normocephalic and atraumatic.  Eyes:     General: No scleral icterus. Cardiovascular:     Rate and Rhythm: Normal rate and regular rhythm.     Heart sounds: Normal heart sounds.  Pulmonary:     Effort: Pulmonary effort is normal. No respiratory distress.     Breath sounds: No wheezing.  Abdominal:     General: Bowel sounds are normal. There is no distension.     Palpations: Abdomen is soft.  Musculoskeletal:        General: No deformity. Normal range of motion.     Cervical back: Normal range of motion and neck supple.  Skin:    General: Skin is warm and dry.     Findings: No erythema or rash.  Neurological:     Mental Status: She is alert and oriented to person, place, and time. Mental status is at baseline.     Cranial Nerves: No cranial nerve deficit.     Coordination: Coordination normal.  Psychiatric:        Mood and Affect: Mood normal.    LABORATORY DATA:  I have reviewed the data as listed Lab Results  Component Value Date   WBC 6.3 10/31/2021   HGB 11.3 (L) 10/31/2021   HCT 34.6 (L) 10/31/2021   MCV 87.2 10/31/2021   PLT 336 10/31/2021   Recent Labs    05/10/21 1536 05/28/21 1617 10/31/21 1256  NA 133* 139 136  K 3.1* 3.8 3.3*  CL 102 102 103  CO2 '25 27 25  '$ GLUCOSE 103* 88 98  BUN '17 15 18  '$ CREATININE 1.13* 0.93 1.02*  CALCIUM 8.7* 9.5 9.3  GFRNONAA >60  --  >60  PROT 8.1 7.9 8.0  ALBUMIN 3.8 4.2 4.0  AST 36 26 16  ALT 19 79* 11  ALKPHOS 43 68 50  BILITOT 0.8 0.6 0.6     Iron/TIBC/Ferritin/ %Sat    Component Value Date/Time   IRON 70 10/31/2021 1256   TIBC 307 10/31/2021 1256   FERRITIN 54 10/31/2021 1256   IRONPCTSAT 23 10/31/2021 1256      RADIOGRAPHIC STUDIES: I have personally reviewed the radiological images as listed and agreed with the findings in the report. No results found.    ASSESSMENT & PLAN:  1. Chronic anticoagulation   2. History of pulmonary embolism   3. History of iron deficiency   4. Protein S deficiency ([place])    Small pulmonary embolism in July 2020, unprovoked. Her hypercoagulable work-up showed normal Antithrombin III level, normal protein C antigen and activity, slightly decreased protein S antigen level 58%  [normal reference 61-136%], negative antiphospholipid antibodies, negative factor V Leiden mutation, negative prothrombin gene mutation. Possible protein S deficiency, I recommend to repeat protein S antigen level in the future. Recommend patient to continue Eliquis 2.5 mg twice daily long-term.  Refills were sent to pharmacy.  iron deficiency anemia .  Hemoglobin stable.  Iron saturation and ferritin level are both normal.  Orders Placed This Encounter  Procedures   CBC with Differential/Platelet    Standing Status:   Future    Standing Expiration Date:   11/09/2022   Comprehensive metabolic panel    Standing Status:   Future    Standing Expiration Date:   11/08/2022   Ferritin    Standing Status:   Future    Standing Expiration Date:   11/09/2022   Iron and TIBC    Standing Status:   Future    Standing Expiration Date:   11/09/2022    All questions were answered. The patient knows to call the clinic with any problems questions or concerns.   Return of visit: 6 months  Thank you for this kind referral and the opportunity to participate in the care of this patient. A copy of today's note is routed to referring provider    Earlie Server, MD, PhD  11/08/2021

## 2021-11-08 NOTE — Progress Notes (Signed)
Pt here for follow up. No new concerns voiced.   

## 2021-11-08 NOTE — Progress Notes (Signed)
Chief Complaint  Patient presents with   Transitions Of Care    Disc about hemorrhoids and rectal bleeding ever since she had last child 7 yrs ago. Episodes occur once every 2 wks or once a wk at times, c/o itching,burning sensation in rectal area denies any pain.     F/u with daughter  1. Hemorrhoids with speaks of blood wiping x 7 years on and off every 2 weeks at times itching and burning in anal region w/o pain  She denies constipation 2. hypoK wants smaller Kdur pill 10 mgeq qd 3. Vit D 3 needs refills weekly d3  4. Nerve pain on gabapentin 300 mg bid wants to increase this but it is helping  Review of Systems  Constitutional:  Negative for weight loss.  HENT:  Negative for hearing loss.   Eyes:  Negative for blurred vision.  Respiratory:  Negative for shortness of breath.   Cardiovascular:  Negative for chest pain.  Gastrointestinal:  Positive for constipation. Negative for abdominal pain and blood in stool.  Musculoskeletal:  Negative for back pain.  Skin:  Negative for rash.  Neurological:  Negative for headaches.  Psychiatric/Behavioral:  Negative for depression.   Past Medical History:  Diagnosis Date   Anemia    Anxiety    panic attacks   Arthritis    Carpal tunnel syndrome, bilateral    Carpal tunnel syndrome, bilateral    Chlamydia    Epilepsy seizure, nonconvulsive, generalized ([place])    Headache(784.0)    migraines   History of C-section    Hypertension    Hypertensive heart disease    Infection    UTI   LVH (left ventricular hypertrophy)    a. 12/2018 Echo: EF 60-65%, sev inc LV wall thickness. Diast dysfxn. Triv AI.   Menorrhagia    Migraines    Pregnancy induced hypertension    with first   Pulmonary embolism ([place])    a. 12/2018 CTA Chest (pleuritic c/p): probable small, nonocclusive PE w/in subsegmental RUL branch vessel. Artifact noted as well.  Eliquis initiatied.   Seizures ([place])    No meds, off since in mid to late 20's   UTI (urinary tract  infection)    Past Surgical History:  Procedure Laterality Date   ABDOMINAL HYSTERECTOMY     CESAREAN SECTION     CYSTOSCOPY N/A 06/29/2019   Procedure: CYSTOSCOPY;  Surgeon: Will Bonnet, MD;  Location: [facility];  Service: Gynecology;  Laterality: N/A;   MOUTH SURGERY     TOTAL LAPAROSCOPIC HYSTERECTOMY WITH SALPINGECTOMY N/A 06/29/2019   Procedure: TOTAL LAPAROSCOPIC HYSTERECTOMY WITH SALPINGECTOMY;  Surgeon: Will Bonnet, MD;  Location: [facility];  Service: Gynecology;  Laterality: N/A;   TUBAL LIGATION N/A 10/30/2013   Procedure: POST PARTUM TUBAL LIGATION;  Surgeon: [name], MD;  Location: [facility];  Service: Gynecology;  Laterality: N/A;   Family History  Problem Relation Age of Onset   Seizures Mother    Hypertension Mother    Diabetes Mother    Heart disease Mother    Stroke Mother    Arthritis Mother    Seizures Brother    Hearing loss Neg Hx    Social History   Socioeconomic History   Marital status: Single    Spouse name: Not on file   Number of children: Not on file   Years of education: Not on file   Highest education level: Not on file  Occupational History   Not on file  Tobacco  Use   Smoking status: Never   Smokeless tobacco: Never   Tobacco comments:    has smoked 1 pack in 1 week her life  Vaping Use   Vaping Use: Never used  Substance and Sexual Activity   Alcohol use: Yes    Comment: socially   Drug use: No   Sexual activity: Yes    Birth control/protection: Surgical  Other Topics Concern   Not on file  Social History Narrative   Teacher- The growing years      Single- 3 kids ages 12-06-15   Social Determinants of Health   Financial Resource Strain: Not on file  Food Insecurity: Not on file  Transportation Needs: Not on file  Physical Activity: Not on file  Stress: Not on file  Social Connections: Not on file  Intimate Partner Violence: Not on file   Current Meds  Medication Sig   amLODipine (NORVASC) 10 MG tablet Take 10  mg by mouth daily.   apixaban (ELIQUIS) 2.5 MG TABS tablet Take 1 tablet (2.5 mg total) by mouth 2 (two) times daily.   Cholecalciferol 1.25 MG (50000 UT) capsule Take 1 capsule (50,000 Units total) by mouth once a week. D3   hydrocortisone (ANUSOL-HC) 2.5 % rectal cream Place 1 application. rectally 2 (two) times daily.   losartan (COZAAR) 50 MG tablet Take 1.5 tablets (75 mg total) by mouth daily.   NON FORMULARY Apply 1 Dose topically 2 (two) times daily with breakfast and lunch.   potassium chloride (MICRO-K) 10 MEQ CR capsule Take 1 capsule (10 mEq total) by mouth daily.   Vitamin D, Ergocalciferol, (DRISDOL) 1.25 MG (50000 UNIT) CAPS capsule Take 1 capsule (50,000 Units total) by mouth every 7 (seven) days. Recheck lab in 3 months   [DISCONTINUED] gabapentin (NEURONTIN) 300 MG capsule TAKE 1 CAPSULE(300 MG) BY MOUTH TWICE DAILY   Allergies  Allergen Reactions   Amoxicillin Hives and Other (See Comments)    Has patient had a PCN reaction causing immediate rash, facial/tongue/throat swelling, SOB or lightheadedness with hypotension: No Has patient had a PCN reaction causing severe rash involving mucus membranes or skin necrosis: No Has patient had a PCN reaction that required hospitalization: No Has patient had a PCN reaction occurring within the last 10 years: Yes If all of the above answers are "NO", then may proceed with Cephalosporin use.    Lisinopril Nausea Only   Recent Results (from the past 2160 hour(s))  Iron and TIBC([facility])     Status: None   Collection Time: 10/31/21 12:56 PM  Result Value Ref Range   Iron 70 28 - 170 ug/dL    Comment: HEMOLYSIS AT THIS LEVEL MAY AFFECT RESULT   TIBC 307 250 - 450 ug/dL   Saturation Ratios 23 10.4 - 31.8 %   UIBC 237 ug/dL    Comment: Performed at [facility], [address] [place], [place] [address]  Protein C, total     Status: None   Collection Time: 10/31/21 12:56 PM  Result Value Ref Range   Protein C,  Total 97 60 - 150 %    Comment: (NOTE) Performed At: [facility] [place], [place] [id number] Rush Farmer MD KX:[id number]   Protein C activity     Status: None   Collection Time: 10/31/21 12:56 PM  Result Value Ref Range   Protein C Activity 121 73 - 180 %    Comment: (NOTE) Performed At: [facility] [place], [place]  [id number] Rush Farmer MD ZD:[id number]   Protein S, Antigen, Free     Status: Abnormal   Collection Time: 10/31/21 12:56 PM  Result Value Ref Range   Protein S Ag, Free 58 (L) 61 - 136 %    Comment: (NOTE) Performed At: [facility] [place], [place] [id number] Rush Farmer MD AY:[id number]   ANTIPHOSPHOLIPID SYNDROME PROF     Status: None   Collection Time: 10/31/21 12:56 PM  Result Value Ref Range   Anticardiolipin IgG <9 0 - 14 GPL U/mL    Comment: (NOTE)                          Negative:              <15                          Indeterminate:     15 - 20                          Low-Med Positive: >20 - 80                          High Positive:         >80    Anticardiolipin IgM <9 0 - 12 MPL U/mL    Comment: (NOTE)                          Negative:              <13                          Indeterminate:     13 - 20                          Low-Med Positive: >20 - 80                          High Positive:         >80    PTT Lupus Anticoagulant 38.3 0.0 - 43.5 sec   DRVVT 40.1 0.0 - 47.0 sec   Lupus Anticoag Interp Comment:     Comment: (NOTE) No lupus anticoagulant was detected. Performed At: [facility] [place], [place] [id number] Rush Farmer MD RK:[id number]   CBC with Differential/Platelet     Status: Abnormal   Collection Time: 10/31/21 12:56 PM  Result Value Ref Range   WBC 6.3 4.0 - 10.5 K/uL   RBC 3.97 3.87 - 5.11 MIL/uL   Hemoglobin 11.3 (L) 12.0 - 15.0 g/dL   HCT 34.6 (L) 36.0 - 46.0 %   MCV 87.2 80.0 - 100.0 fL   MCH 28.5  26.0 - 34.0 pg   MCHC 32.7 30.0 - 36.0 g/dL   RDW 13.3 11.5 - 15.5 %   Platelets 336 150 - 400 K/uL   nRBC 0.0 0.0 - 0.2 %   Neutrophils Relative % 67 %   Neutro Abs 4.2 1.7 - 7.7 K/uL   Lymphocytes Relative 24 %   Lymphs Abs 1.5 0.7 - 4.0 K/uL   Monocytes Relative 7 %   Monocytes Absolute 0.5 0.1 - 1.0 K/uL   Eosinophils Relative  1 %   Eosinophils Absolute 0.1 0.0 - 0.5 K/uL   Basophils Relative 0 %   Basophils Absolute 0.0 0.0 - 0.1 K/uL   Immature Granulocytes 1 %   Abs Immature Granulocytes 0.03 0.00 - 0.07 K/uL    Comment: Performed at [facility], [address] [place], [place] [address]  Comprehensive metabolic panel     Status: Abnormal   Collection Time: 10/31/21 12:56 PM  Result Value Ref Range   Sodium 136 135 - 145 mmol/L   Potassium 3.3 (L) 3.5 - 5.1 mmol/L   Chloride 103 98 - 111 mmol/L   CO2 25 22 - 32 mmol/L   Glucose, Bld 98 70 - 99 mg/dL    Comment: Glucose reference range applies only to samples taken after fasting for at least 8 hours.   BUN 18 6 - 20 mg/dL   Creatinine, Ser 1.02 (H) 0.44 - 1.00 mg/dL   Calcium 9.3 8.9 - 10.3 mg/dL   Total Protein 8.0 6.5 - 8.1 g/dL   Albumin 4.0 3.5 - 5.0 g/dL   AST 16 15 - 41 U/L   ALT 11 0 - 44 U/L   Alkaline Phosphatase 50 38 - 126 U/L   Total Bilirubin 0.6 0.3 - 1.2 mg/dL   GFR, Estimated >60 >60 mL/min    Comment: (NOTE) Calculated using the CKD-EPI Creatinine Equation (2021)    Anion gap 8 5 - 15    Comment: Performed at [facility], [address], [place], [place] [address]  Ferritin     Status: None   Collection Time: 10/31/21 12:56 PM  Result Value Ref Range   Ferritin 54 11 - 307 ng/mL    Comment: Performed at [facility], [address] [place], [place] [address]  Factor 5 leiden     Status: None   Collection Time: 10/31/21 12:56 PM  Result Value Ref Range   Recommendations-F5LEID: Comment     Comment: (NOTE) Result: c.1601G>A (p.Arg534Gln) - Not Detected This result  is not associated with an increased risk for venous thromboembolism. See Additional Clinical Information and Comments. Additional Clinical Information: Venous thromboembolism is a multifactorial disease influenced by genetic, environmental, and circumstantial risk factors. The c.1601G>A (p. Arg534Gln) variant in the F5 gene, commonly referred to as Factor V Leiden, is a genetic risk factor for venous thromboembolism. Heterozygous carriers of this variant have a 6- to 8- fold increased risk for venous thromboembolism. Individuals homozygous for this variant (ie, with a copy of the variant on each chromosome) have an approximately 80-fold increased risk for venous thromboembolism. Individuals who carry both a c.*97G>A variant in the F2 gene and Factor V Leiden have an approximately 20-fold increased risk for venous thromboembolism. Risks are likely to be even higher in more complex genotype combinations in volving the F2 c.*97G>A variant and Factor V Leiden (PMID: 36644034). Additional risk factors include but are not limited to: deficiency of protein C, protein S, or antithrombin III, age, female sex, personal or family history of deep vein thromboembolism, smoking, surgery, prolonged immobilization, malignant neoplasm, tamoxifen treatment, raloxifene treatment, oral contraceptive use, hormone replacement therapy, and pregnancy. Management of thrombotic risk and thrombotic events should follow established guidelines and fit the clinical circumstance. This result cannot predict the occurrence or recurrence of a thrombotic event. Comment: Genetic counseling is recommended to discuss the potential clinical implications of positive results, as well as recommendations for testing family members. Genetic Coordinators are available for health care providers to discuss results  at [phone]). Test Details: Variant Analyzed: c.1601G>A (p. Arg534Gln), referred to as Fact or  V Leiden Methods/Limitations: DNA analysis of the F5 gene (NM_000130.5) was performed by PCR amplification followed by restriction enzyme analysis. The diagnostic sensitivity is >99%. Results must be combined with clinical information for the most accurate interpretation. Molecular- based testing is highly accurate, but as in any laboratory test, diagnostic errors may occur. False positive or false negative results may occur for reasons that include genetic variants, blood transfusions, bone marrow transplantation, somatic or tissue-specific mosaicism, mislabeled samples, or erroneous representation of family relationships. This test was developed and its performance characteristics determined by [facility]. It has not been cleared or approved by the Food and Drug Administration. References: Jamse [facility], Laurann [place] [facility]; [facility] Professional Practice and Guidelines Committee. Addendum: [place] consensus statement on fac tor V Leiden mutation testing. Genet Med. 2021 Mar 5. doi: 35.3614/E31540-086- 01108-x. PMID: 76195093. Kristopher Oppenheim. Factor V Leiden Thrombophilia. 1999 May 14 (Updated 2018 Jan 4). In: Tarri Glenn, Ardinger HH, Pagon RA, et al., editors. GeneReviews(R) (Internet). [address] ([place]): [place] [employer] [place], [place]; 1993-2021. Available from: [email or web address] Terrilee Files, Carla Drape, Marin Shutter CS; [facility] Laboratory Quality Assurance Committee. Venous thromboembolism laboratory testing (factor V Leiden and factor II c.*97G>A), 2018 update: a technical standard of the [place] ([facility]). Genet Med. 2018 Dec;20(12):1489-1498. doi: 26.7124/P80998-338-2505-L. Epub 2018 Oct 5. PMID: 97673419.   Prothrombin gene mutation     Status: None   Collection Time: 10/31/21 12:56 PM  Result Value Ref Range   Recommendations-PTGENE: Comment      Comment: (NOTE) Result: c.*97G>A - Not Detected This result is not associated with an increased risk for venous thromboembolism. See Additional Clinical Information and Comments. Additional Clinical Information: Venous thromboembolism is a multifactorial disease influenced by genetic, environmental, and circumstantial risk factors. The c.*97G>A variant in the F2 gene is a genetic risk factor for venous thromboembolism. Heterozygous carriers have a 2- to 4-fold increased risk for venous thromboembolism. Homozygotes for the c.*97G>A variant are rare. The annual risk of VTE in homozygotes has been reported to be 1.1%/year. Individuals who carry both a c.*97G>A variant in the F2 gene and a c.1601G>A (p. Arg534Gln) variant in the F5 gene (commonly referred to as Factor V Leiden) have an approximately 20- fold increased risk for venous thromboembolism. Risks are likely to be even higher in more complex genotype combinations involving the F2 c.*97G>A variant and Factor V Leiden (PMID:  37902409). Additional risk factors include but are not limited to: deficiency of protein C, protein S, or antithrombin III, age, female sex, personal or family history of deep vein thromboembolism, smoking, surgery, prolonged immobilization, malignant neoplasm, tamoxifen treatment, raloxifene treatment, oral contraceptive use, hormone replacement therapy, and pregnancy. Management of thrombotic risk and thrombotic events should follow established guidelines and fit the clinical circumstance. This result cannot predict the occurrence or recurrence of a thrombotic event. Comments: Genetic counseling is recommended to discuss the potential clinical implications of positive results, as well as recommendations for testing family members. Genetic Coordinators are available for health care providers to discuss results at [phone]). Test Details: Variant analyzed: c.*97G>A, previously referred to as  G20210A Methods/Limitations: DNA analysis of the F2 gene (NM_000 506.5) was performed by PCR amplification followed by restriction enzyme analysis. The diagnostic sensitivity is >99%. Results must be combined with clinical information for the most accurate interpretation. Molecular-based  testing is highly accurate, but as in any laboratory test, diagnostic errors may occur. False positive or false negative results may occur for reasons that include genetic variants, blood transfusions, bone marrow transplantation, somatic or tissue-specific mosaicism, mislabeled samples, or erroneous representation of family relationships. This test was developed and its performance characteristics determined by [facility]. It has not been cleared or approved by the Food and Drug Administration. References: Jamse [place] [facility], Laurann [place] [facility]; [facility] Professional Practice and Guidelines Committee. Addendum: [place] consensus statement on factor V Leiden mutation testing. Genet Med. 2021 Mar 5. doi: 09.6283/M629 [id number]-x. PMID: 47654650. Kristopher Oppenheim. Prothrombin Thrombophilia. 2006 Jul 25 [Updated 2021 Feb 4]. In: Tarri Glenn, Ardinger HH, Pagon RA, et al., editors. GeneReviews(R) [Internet]. [address] ([place]): [place] [employer] [place], [place]; 1993-2021. Available from: [email or web address]/ Terrilee Files, Carla Drape, Marin Shutter CS; [facility] Laboratory Quality Assurance Committee. Venous thromboembolism laboratory testing (factor V Leiden and factor II c.*97G>A), 2018 update: a technical standard of the [place] ([facility]). Genet Med. 2018 Dec;20(12):1489-1498. doi: 35.4656/[id number]-S. Epub 2018 Oct 5. PMID: 49675916.   Antithrombin III     Status: None   Collection Time: 10/31/21 12:56 PM  Result Value Ref Range   AntiThromb III Func 105 75 - 120 %    Comment: Performed  at [facility] [address] [place], [place] [address]  POCT occult blood stool     Status: Normal   Collection Time: 11/08/21 11:06 AM  Result Value Ref Range   Fecal Occult Blood, POC Negative Negative   Card #1 Date     Card #2 Fecal Occult Blod, POC     Card #2 Date     Card #3 Fecal Occult Blood, POC     Card #3 Date     Objective  Body mass index is 36.47 kg/m. Wt Readings from Last 3 Encounters:  11/08/21 202 lb 9.6 oz (91.9 kg)  07/13/21 192 lb 6 oz (87.3 kg)  06/20/21 190 lb 12.8 oz (86.5 kg)   Temp Readings from Last 3 Encounters:  11/08/21 98.5 F (36.9 C) (Oral)  06/20/21 98.7 F (37.1 C) (Oral)  06/12/21 98.5 F (36.9 C) (Oral)   BP Readings from Last 3 Encounters:  11/08/21 120/80  07/13/21 138/84  06/20/21 130/80   Pulse Readings from Last 3 Encounters:  11/08/21 83  07/13/21 (!) 104  06/12/21 74    Physical Exam Vitals and nursing note reviewed.  Constitutional:      Appearance: Normal appearance. She is well-developed and well-groomed.  HENT:     Head: Normocephalic and atraumatic.  Eyes:     Conjunctiva/sclera: Conjunctivae normal.     Pupils: Pupils are equal, round, and reactive to light.  Cardiovascular:     Rate and Rhythm: Normal rate and regular rhythm.     Heart sounds: Normal heart sounds. No murmur heard. Pulmonary:     Effort: Pulmonary effort is normal.     Breath sounds: Normal breath sounds.  Abdominal:     General: Abdomen is flat. Bowel sounds are normal.     Tenderness: There is no abdominal tenderness.  Genitourinary:    Rectum: Guaiac result negative. External hemorrhoid present.  Musculoskeletal:        General: No tenderness.  Skin:    General: Skin is warm and dry.  Neurological:     General: No focal deficit present.  Mental Status: She is alert and oriented to person, place, and time. Mental status is at baseline.     Cranial Nerves: Cranial nerves 2-12 are intact.     Motor: Motor function is  intact.     Coordination: Coordination is intact.     Gait: Gait is intact.  Psychiatric:        Attention and Perception: Attention and perception normal.        Mood and Affect: Mood and affect normal.        Speech: Speech normal.        Behavior: Behavior normal. Behavior is cooperative.        Thought Content: Thought content normal.        Cognition and Memory: Cognition and memory normal.        Judgment: Judgment normal.    Assessment  Plan  Hemorrhoids, unspecified hemorrhoid type - Plan: Ambulatory referral to Gastroenterology, hydrocortisone (ANUSOL-HC) 2.5 % rectal cream, POCT occult blood stool neg   Rectal bleeding - Plan: Ambulatory referral to Gastroenterology, hydrocortisone (ANUSOL-HC) 2.5 % rectal cream, POCT occult blood stool  Vitamin D deficiency - Plan: Cholecalciferol 1.25 MG (50000 UT) capsule  Hypokalemia - Plan: potassium chloride (MICRO-K) 10 MEQ CR capsule Given high K food list   Screening mammogram, encounter for - Plan: MM 3D SCREEN BREAST  BILATERAL   HM Address at f/u and repeat labs Provider: Dr. Olivia Mackie McLean-Scocuzza-Internal Medicine

## 2021-11-08 NOTE — Patient Instructions (Signed)
Dr. Marius Ditch  Primary Contact Information  Phone Fax E-mail Address  [phone] Not available [place] [address]     Specialties     Gastroenterology      Hemorrhoids Hemorrhoids are swollen veins in and around the rectum or anus. There are two types of hemorrhoids: Internal hemorrhoids. These occur in the veins that are just inside the rectum. They may poke through to the outside and become irritated and painful. External hemorrhoids. These occur in the veins that are outside the anus and can be felt as a painful swelling or hard lump near the anus. Most hemorrhoids do not cause serious problems, and they can be managed with home treatments such as diet and lifestyle changes. If home treatments do not help the symptoms, procedures can be done to shrink or remove the hemorrhoids. What are the causes? This condition is caused by increased pressure in the anal area. This pressure may result from various things, including: Constipation. Straining to have a bowel movement. Diarrhea. Pregnancy. Obesity. Sitting for long periods of time. Heavy lifting or other activity that causes you to strain. Anal sex. Riding a bike for a long period of time. What are the signs or symptoms? Symptoms of this condition include: Pain. Anal itching or irritation. Rectal bleeding. Leakage of stool (feces). Anal swelling. One or more lumps around the anus. How is this diagnosed? This condition can often be diagnosed through a visual exam. Other exams or tests may also be done, such as: An exam that involves feeling the rectal area with a gloved hand (digital rectal exam). An exam of the anal canal that is done using a small tube (anoscope). A blood test, if you have lost a significant amount of blood. A test to look inside the colon using a flexible tube with a camera on the end (sigmoidoscopy or colonoscopy). How is this treated? This condition can usually be  treated at home. However, various procedures may be done if dietary changes, lifestyle changes, and other home treatments do not help your symptoms. These procedures can help make the hemorrhoids smaller or remove them completely. Some of these procedures involve surgery, and others do not. Common procedures include: Rubber band ligation. Rubber bands are placed at the base of the hemorrhoids to cut off their blood supply. Sclerotherapy. Medicine is injected into the hemorrhoids to shrink them. Infrared coagulation. A type of light energy is used to get rid of the hemorrhoids. Hemorrhoidectomy surgery. The hemorrhoids are surgically removed, and the veins that supply them are tied off. Stapled hemorrhoidopexy surgery. The surgeon staples the base of the hemorrhoid to the rectal wall. Follow these instructions at home: Eating and drinking  Eat foods that have a lot of fiber in them, such as whole grains, beans, nuts, fruits, and vegetables. Ask your health care provider about taking products that have added fiber (fiber supplements). Reduce the amount of fat in your diet. You can do this by eating low-fat dairy products, eating less red meat, and avoiding processed foods. Drink enough fluid to keep your urine pale yellow. Managing pain and swelling  Take warm sitz baths for 20 minutes, 3-4 times a day to ease pain and discomfort. You may do this in a bathtub or using a portable sitz bath that fits over the toilet. If directed, apply ice to the affected area. Using ice packs between sitz baths may be helpful. Put ice in a plastic bag. Place a towel between your  skin and the bag. Leave the ice on for 20 minutes, 2-3 times a day. General instructions Take over-the-counter and prescription medicines only as told by your health care provider. Use medicated creams or suppositories as told. Get regular exercise. Ask your health care provider how much and what kind of exercise is best for you. In  general, you should do moderate exercise for at least 30 minutes on most days of the week (150 minutes each week). This can include activities such as walking, biking, or yoga. Go to the bathroom when you have the urge to have a bowel movement. Do not wait. Avoid straining to have bowel movements. Keep the anal area dry and clean. Use wet toilet paper or moist towelettes after a bowel movement. Do not sit on the toilet for long periods of time. This increases blood pooling and pain. Keep all follow-up visits as told by your health care provider. This is important. Contact a health care provider if you have: Increasing pain and swelling that are not controlled by treatment or medicine. Difficulty having a bowel movement, or you are unable to have a bowel movement. Pain or inflammation outside the area of the hemorrhoids. Get help right away if you have: Uncontrolled bleeding from your rectum. Summary Hemorrhoids are swollen veins in and around the rectum or anus. Most hemorrhoids can be managed with home treatments such as diet and lifestyle changes. Taking warm sitz baths can help ease pain and discomfort. In severe cases, procedures or surgery can be done to shrink or remove the hemorrhoids. This information is not intended to replace advice given to you by your health care provider. Make sure you discuss any questions you have with your health care provider. Document Revised: 12/20/2020 Document Reviewed: 12/20/2020 Elsevier Patient Education  Mary Roberts.

## 2022-02-19 ENCOUNTER — Other Ambulatory Visit: Payer: Self-pay | Admitting: Internal Medicine

## 2022-05-07 ENCOUNTER — Inpatient Hospital Stay: Payer: 59 | Attending: Oncology

## 2022-05-09 ENCOUNTER — Inpatient Hospital Stay: Payer: 59 | Admitting: Oncology

## 2022-07-31 ENCOUNTER — Other Ambulatory Visit: Payer: Self-pay | Admitting: *Deleted

## 2022-07-31 DIAGNOSIS — Z79899 Other long term (current) drug therapy: Secondary | ICD-10-CM

## 2022-08-02 MED ORDER — LOSARTAN POTASSIUM 50 MG PO TABS: 75.0000 mg | ORAL_TABLET | Freq: Every day | ORAL | 0 refills | Status: DC

## 2022-08-02 NOTE — Telephone Encounter (Signed)
LVM

## 2022-08-06 NOTE — Telephone Encounter (Signed)
LVM to schedule appt

## 2022-08-15 NOTE — Telephone Encounter (Signed)
Patient states that she currently lives in [place] and has not established a new cardiologist, is in need of med refill.

## 2022-08-15 NOTE — Telephone Encounter (Signed)
Please advise if ok to refill. 

## 2022-08-16 MED ORDER — LOSARTAN POTASSIUM 50 MG PO TABS: 75.0000 mg | ORAL_TABLET | Freq: Every day | ORAL | 0 refills | Status: AC

## 2022-08-16 NOTE — Telephone Encounter (Signed)
Requested Prescriptions   Signed Prescriptions Disp Refills   losartan (COZAAR) 50 MG tablet 45 tablet 0    Sig: Take 1.5 tablets (75 mg total) by mouth daily.    Authorizing Provider: Antony Madura    Ordering User: Britt Bottom

## 2022-08-16 NOTE — Addendum Note (Signed)
Addended by: Britt Bottom on: 08/16/2022 01:52 PM   Modules accepted: Orders

## 2022-10-16 ENCOUNTER — Ambulatory Visit
Admit: 2022-10-16 | Discharge: 2022-10-16 | Payer: PRIVATE HEALTH INSURANCE | Attending: Family Medicine | Primary: Family Medicine

## 2022-10-16 DIAGNOSIS — I10 Essential (primary) hypertension: Secondary | ICD-10-CM

## 2022-10-16 LAB — CBC
Hematocrit: 36.8 % (ref 34.0–48.0)
Hemoglobin: 11.8 g/dL (ref 11.5–15.5)
MCH: 28.2 pg (ref 26.0–35.0)
MCHC: 32.1 g/dL (ref 32.0–34.5)
MCV: 87.8 fL (ref 80.0–99.9)
MPV: 9.2 fL (ref 7.0–12.0)
Platelets: 319 10*3/uL (ref 130–450)
RBC: 4.19 m/uL (ref 3.50–5.50)
RDW: 13.4 % (ref 11.5–15.0)
WBC: 5.3 10*3/uL (ref 4.5–11.5)

## 2022-10-16 LAB — COMPREHENSIVE METABOLIC PANEL
ALT: 6 U/L (ref 0–32)
AST: 13 U/L (ref 0–31)
Albumin: 4.6 g/dL (ref 3.5–5.2)
Alkaline Phosphatase: 56 U/L (ref 35–104)
Anion Gap: 14 mmol/L (ref 7–16)
BUN: 11 mg/dL (ref 6–20)
CO2: 21 mmol/L — ABNORMAL LOW (ref 22–29)
Calcium: 9.6 mg/dL (ref 8.6–10.2)
Chloride: 105 mmol/L (ref 98–107)
Creatinine: 0.7 mg/dL (ref 0.50–1.00)
Est, Glom Filt Rate: >90 mL/min/{1.73_m2} (ref >60)
Glucose: 83 mg/dL (ref 74–99)
Potassium: 3.6 mmol/L (ref 3.5–5.0)
Sodium: 140 mmol/L (ref 132–146)
Total Bilirubin: 0.5 mg/dL (ref 0.0–1.2)
Total Protein: 8.3 g/dL (ref 6.4–8.3)

## 2022-10-16 LAB — TSH: TSH: 0.78 u[IU]/mL (ref 0.27–4.20)

## 2022-10-16 LAB — LIPID PANEL
Cholesterol: 196 mg/dL (ref <200)
HDL: 36 mg/dL — ABNORMAL LOW (ref >40)
LDL Cholesterol: 145 mg/dL — ABNORMAL HIGH (ref <100)
Triglycerides: 75 mg/dL (ref <150)
VLDL: 15 mg/dL

## 2022-10-16 MED ORDER — APIXABAN 2.5 MG PO TABS
2.5 | ORAL_TABLET | Freq: Two times a day (BID) | ORAL | 0 refills | Status: DC
Start: 2022-10-16 — End: 2024-01-27

## 2022-10-16 MED ORDER — VITAMIN D (ERGOCALCIFEROL) 1.25 MG (50000 UT) PO CAPS
1.2550000 MG (50000 UT) | ORAL_CAPSULE | ORAL | 1 refills | Status: AC
Start: 2022-10-16 — End: ?

## 2022-10-16 MED ORDER — GABAPENTIN 300 MG PO CAPS
300 MG | ORAL_CAPSULE | Freq: Every evening | ORAL | 0 refills | Status: AC
Start: 2022-10-16 — End: 2023-01-14

## 2022-10-16 MED ORDER — LOSARTAN POTASSIUM 50 MG PO TABS
50 MG | ORAL_TABLET | ORAL | 5 refills | Status: AC
Start: 2022-10-16 — End: 2023-04-17

## 2022-10-16 MED ORDER — AMLODIPINE BESYLATE 10 MG PO TABS
10 MG | ORAL_TABLET | ORAL | 5 refills | Status: AC
Start: 2022-10-16 — End: 2023-04-17

## 2022-10-16 NOTE — Progress Notes (Unsigned)
[facility]  Department of Family Medicine      Patient:  Robin Wu 42 y.o. female     Date of Service: 10/15/22      Chief complaint: No chief complaint on file.        History ofPresent Illness   The patient is a 42 y.o. female  presented to the clinic with complaints as above.    NP        Past Medical History:  No past medical history on file.    PastSurgical History:    No past surgical history on file.    Allergies:    Patient has no allergy information on record.    Social History:   Social History     Socioeconomic History    Marital status: Not on file     Spouse name: Not on file    Number of children: Not on file    Years of education: Not on file    Highest education level: Not on file   Occupational History    Not on file   Tobacco Use    Smoking status: Not on file    Smokeless tobacco: Not on file   Substance and Sexual Activity    Alcohol use: Not on file    Drug use: Not on file    Sexual activity: Not on file   Other Topics Concern    Not on file   Social History Narrative    Not on file     Social Determinants of Health     Financial Resource Strain: Not on file   Food Insecurity: Not on file   Transportation Needs: Not on file   Physical Activity: Not on file   Stress: Not on file   Social Connections: Not on file   Intimate Partner Violence: Not on file   Housing Stability: Not on file        Family History:   No family history on file.    Review of Systems:   Review of Systems    Physical Exam   Vitals: There were no vitals taken for this visit.  Physical Exam        Assessment and Plan       There are no diagnoses linked to this encounter.    Counseled regarding above diagnosis, including possible risks and complications,  especially if left uncontrolled. Counseled regarding the possible side effects, risks, benefits and alternatives to treatment;patient and/or guardian verbalizes understanding, agrees, feels comfortable with and wishes to proceed with above  treatment plan.    Call or go to EDimmediately if symptoms worsen or persist. Advised patient to call with any new medication issues, and, as applicable, read all Rx info from pharmacy to assure aware of all possible risks and side effects of medicationbefore taking.     Patient and/or guardian given opportunity to ask questions/raise concerns.  The patient verbalized comfort and understanding ofinstructions.    I encourage further reading and education about your health conditions.  Information on many health conditions is provided by theAmerican Academy of Family Physicians: https://familydoctor.org/  Please bring any questions to me at your nextvisit.    Return to Office: No follow-ups on file.    Medication List:    No current outpatient medications on file.     No current facility-administered medications for this visit.        Niel Hummer, DO       This document  may have been prepared at least partially through the use of voice recognition software. Although effort is taken to assure the accuracy ofthis document, it is possible that grammatical, syntax,  or spelling errors may occur.

## 2022-11-21 ENCOUNTER — Inpatient Hospital Stay
Admit: 2022-11-21 | Discharge: 2022-11-21 | Disposition: A | Discharge status: Home / Self Care | Payer: PRIVATE HEALTH INSURANCE

## 2022-11-21 ENCOUNTER — Emergency Department: Payer: PRIVATE HEALTH INSURANCE | Primary: Family Medicine

## 2022-11-21 DIAGNOSIS — N760 Acute vaginitis: Secondary | ICD-10-CM

## 2022-11-21 LAB — URINALYSIS WITH MICROSCOPIC
Bilirubin, Urine: NEGATIVE
Glucose, Ur: NEGATIVE mg/dL
Ketones, Urine: NEGATIVE mg/dL
Leukocyte Esterase, Urine: NEGATIVE
Nitrite, Urine: NEGATIVE
Protein, UA: NEGATIVE mg/dL
RBC, UA: 0 /HPF
Specific Gravity, UA: 1.02 (ref 1.005–1.030)
Urobilinogen, Urine: 0.2 EU/dL (ref 0.0–1.0)
WBC, UA: 0 /HPF
pH, Urine: 6 (ref 5.0–9.0)

## 2022-11-21 LAB — COMPREHENSIVE METABOLIC PANEL
ALT: 9 U/L (ref 0–32)
AST: 15 U/L (ref 0–31)
Albumin: 4.3 g/dL (ref 3.5–5.2)
Alkaline Phosphatase: 57 U/L (ref 35–104)
Anion Gap: 9 mmol/L (ref 7–16)
BUN: 11 mg/dL (ref 6–20)
CO2: 24 mmol/L (ref 22–29)
Calcium: 9.2 mg/dL (ref 8.6–10.2)
Chloride: 102 mmol/L (ref 98–107)
Creatinine: 0.8 mg/dL (ref 0.50–1.00)
Est, Glom Filt Rate: >90 mL/min/{1.73_m2} (ref >60)
Glucose: 90 mg/dL (ref 74–99)
Potassium: 3.3 mmol/L — ABNORMAL LOW (ref 3.5–5.0)
Sodium: 135 mmol/L (ref 132–146)
Total Bilirubin: 0.3 mg/dL (ref 0.0–1.2)
Total Protein: 8.3 g/dL (ref 6.4–8.3)

## 2022-11-21 LAB — CBC WITH AUTO DIFFERENTIAL
Basophils %: 0 % (ref 0.0–2.0)
Basophils Absolute: 0.01 10*3/uL (ref 0.00–0.20)
Eosinophils %: 1 % (ref 0–6)
Eosinophils Absolute: 0.06 10*3/uL (ref 0.05–0.50)
Hematocrit: 36 % (ref 34.0–48.0)
Hemoglobin: 11.4 g/dL — ABNORMAL LOW (ref 11.5–15.5)
Immature Granulocytes %: 0 % (ref 0.0–5.0)
Immature Granulocytes Absolute: <0.03 10*3/uL (ref 0.00–0.58)
Lymphocytes %: 25 % (ref 20.0–42.0)
Lymphocytes Absolute: 1.38 10*3/uL — ABNORMAL LOW (ref 1.50–4.00)
MCH: 28.3 pg (ref 26.0–35.0)
MCHC: 31.7 g/dL — ABNORMAL LOW (ref 32.0–34.5)
MCV: 89.3 fL (ref 80.0–99.9)
MPV: 9 fL (ref 7.0–12.0)
Monocytes %: 8 % (ref 2.0–12.0)
Monocytes Absolute: 0.45 10*3/uL (ref 0.10–0.95)
Neutrophils %: 66 % (ref 43.0–80.0)
Neutrophils Absolute: 3.63 10*3/uL (ref 1.80–7.30)
Platelets: 284 10*3/uL (ref 130–450)
RBC: 4.03 m/uL (ref 3.50–5.50)
RDW: 13.9 % (ref 11.5–15.0)
WBC: 5.5 10*3/uL (ref 4.5–11.5)

## 2022-11-21 LAB — WET PREP, GENITAL
Trich, Wet Prep: NONE SEEN
Yeast, Wet Prep: NONE SEEN

## 2022-11-21 MED ORDER — METRONIDAZOLE 500 MG PO TABS
500 | ORAL_TABLET | Freq: Two times a day (BID) | ORAL | 0 refills | Status: AC
Start: 2022-11-21 — End: 2022-11-28

## 2022-11-21 MED ORDER — SODIUM CHLORIDE 0.9 % IV BOLUS
0.9 | Freq: Once | INTRAVENOUS | Status: DC
Start: 2022-11-21 — End: 2022-11-21

## 2022-11-21 MED ORDER — POTASSIUM CHLORIDE CRYS ER 20 MEQ PO TBCR
20 | Freq: Once | ORAL | Status: DC
Start: 2022-11-21 — End: 2022-11-21

## 2022-11-21 NOTE — ED Provider Notes (Signed)
Department of Emergency Medicine  FIRST PROVIDER TRIAGE NOTE             Independent MLP           11/21/22  3:32 PM EDT    Date of Encounter: 11/21/22   MRN: [id number]      HPI: Robin Wu is a 42 y.o. female who presents to the ED for Vaginal Bleeding (vaginal bleeding, full hysterectomy 2022. Bright red blood small amount started today)       Patient reports that she had a history of hysterectomy with her cervix removed she only has her ovaries she does not have fallopian tubes.  She has no concerns for STIs she has no abdominal pain on exam.  She states that the last time she had this she had no symptoms of UTI but had a UTI she is not from the area recently moved does not have a gynecologis.  She has no new sexual partners or concern for STIs.  She has no flank pain.  She reports she has not saturated any menstrual pads is more or less when she wipes she sees a little bright red bleeding on the toilet tissue.    Vitals:    11/21/22 1523   BP: (!) 168/103   Pulse: 79   Resp: 16   Temp: 97.1 F (36.2 C)   SpO2: 100%         ROS: Negative for cp, sob, abd pain, back pain, fever, vomiting, diarrhea, urinary complaints, rash, or dizziness.    PE: Gen Appearance/Constitutional: alert  HEENT: NC/NT. PERRLA,  Airway patent.  Neck: supple  Pulm: CTA bilat  Musculoskeletal: moves all extremities x 4     Initial Plan of Care:  Plan to order/Initiate the following while awaiting opening in ED: labs.  Proceed toTreatment Area When Bed Available. Instructed patient and/or family member with patient if any worsening condition to notify staff.    Provider-Patient relationship only established for Provider In Triage (PIT) secondary to high volume, low staffing, and/or boarding- patient to await bed availability..  Full assessment, HPI and examination not performed.  Discussed with patient that the provider in triage evaluation is not a comprehensive medical screening exam and this is not yet possible at the end of the  provider in triage encounter, to state whether or not an emergency medical condition exist  This ends my PIT-Patient relationship.    Electronically signed by Shelbie Hutching, APRN - CNP   DD: 11/21/22       Shelbie Hutching, APRN - CNP  11/21/22 1534

## 2022-11-21 NOTE — Discharge Instructions (Addendum)
Do not drink alcohol or when taking Flagyl and up to 72 hours after last dose

## 2022-11-21 NOTE — ED Provider Notes (Incomplete)
Independent APP Visit.       [facility]  Department of Emergency Medicine   ED  Encounter Note  Admit Date/RoomTime: 11/21/2022  4:48 PM  ED Room: WAITING RESULTS/WAITING *    NAME: Robin Wu  DOB: [birth year]  MRN: [id number]     Chief Complaint:  Vaginal Bleeding (vaginal bleeding, full hysterectomy 2022. Bright red blood small amount started today)    History of Present Illness       Robin Wu is a 42 y.o. old female who presents to the emergency department by private vehicle for abnormal bleeding, which occured a few hour(s) prior to arrival.  Since onset the symptoms have been intermittent and mild in severity.  Symptoms are associated with urinary frequency and urgency and denies any abdominal pain, back pain, chest pain, shortness of breath, fever, chills, sweating, nausea, vomiting, anorexia, weight loss, diarrhea, constipation, dark/black stools, blood in stool, blood in emesis, cloudy urine, dysuria, urinary incontinence, vaginal discharge, or vaginal itching. She takes Eliquis.  No LMP recorded.. No obstetric history on file.Marland Kitchen  GYN/STD Hx: hysterectomy in 2021 2022 due to heavy menstrual cycles.  She denies any injury or trauma.  She states she only notices blood on the toilet paper after she urinates and wipes.  She states this happened before when she had a UTI.  Denies any concerns for any STDs..  Patient is not sure if the blood coming from her vagina or urine.    ROS   Pertinent positives and negatives are stated within HPI, all other systems reviewed and are negative.    Past Medical History:  has a past medical history of History of seizures, HTN (hypertension), Neuropathy, Pulmonary embolism (HCC), and Vitamin D deficiency.    Surgical History:  has a past surgical history that includes Hysterectomy, total abdominal; Cesarean section; and Tubal ligation.    Social History:  reports that she has never smoked. She has never used smokeless tobacco.    Family History: family  history is not on file.     Allergies: Amoxicillin    Physical Exam   Oxygen Saturation Interpretation: Normal.        ED Triage Vitals   BP Temp Temp Source Pulse Respirations SpO2 Height Weight - Scale   11/21/22 1523 11/21/22 1523 11/21/22 1523 11/21/22 1523 11/21/22 1523 11/21/22 1523 11/21/22 1458 11/21/22 1458   (!) 168/103 97.1 F (36.2 C) Oral 79 16 100 % 1.575 m (5\' 2" ) 90.7 kg (200 lb)         General Appearance/Constitutional:  Alert, development consistent with age, NAD.  HEENT:  NC/NT. PERRLA.  Airway patent.  Neck:  Supple.  No lymphadenopathy.  Respiratory:  Clear to auscultation and breath sounds equal.  CV:  Regular rate and rhythm.  GI:  normal appearing, non-distended with no visible hernias.       Bowel sounds: normal bowel sounds.           Tenderness: No abdominal tenderness, guarding, rebound, rigidity or pulsatile mass..        Liver: non-tender.               Spleen:  non-tender.  Back: CVA Tenderness: No CVA tenderness.  GU: Pelvic Exam: (Same sex / third party chaperone present during examination, Brooke, Charity fundraiser).       External Genitalia: General appearance; normal, Hair distribution; normal, Lesions absent.       Urethral Meatus: Size normal, Location normal, Lesions absent, Prolapse absent.  Vagina: discharge creamy .  No blood noted in the vaginal vault       Cervix: Surgically removed.       Uterus: Surgically removed.       Anus/Perineum:  normal.  Integument:  Normal turgor.  Warm, dry, without visible rash, unless noted elsewhere.  Lymphatics: No lymphangitis or adenopathy noted.  Neurological:  Orientation age-appropriate.  Motor functions intact.    Lab / Imaging Results   (All laboratory and radiology results have been personally reviewed by myself)  Labs:  Results for orders placed or performed during the hospital encounter of 11/21/22   Urinalysis with Microscopic   Result Value Ref Range    Color, UA Yellow Yellow    Turbidity UA Clear Clear    Glucose, Ur NEGATIVE  NEGATIVE mg/dL    Bilirubin, Urine NEGATIVE NEGATIVE    Ketones, Urine NEGATIVE NEGATIVE mg/dL    Specific Gravity, UA 1.020 1.005 - 1.030    Urine Hgb TRACE (A) NEGATIVE    pH, Urine 6.0 5.0 - 9.0    Protein, UA NEGATIVE NEGATIVE mg/dL    Urobilinogen, Urine 0.2 0.0 - 1.0 EU/dL    Nitrite, Urine NEGATIVE NEGATIVE    Leukocyte Esterase, Urine NEGATIVE NEGATIVE    WBC, UA PENDING /HPF    RBC, UA PENDING /HPF    Casts UA PENDING /LPF    Crystals, UA PENDING None /HPF    Epithelial Cells, UA PENDING /HPF    Renal Epithelial, UA PENDING 0 /HPF    Bacteria, UA PENDING None    Mucus, UA PENDING None    Trichomonas PENDING None    Amorphous, UA PENDING None    Yeast, UA PENDING None    Other Observations UA PENDING NOT REQ.     Imaging:  All Radiology results interpreted by Radiologist unless otherwise noted.  CT ABDOMEN PELVIS W IV CONTRAST Additional Contrast? None    (Results Pending)     ED Course / Medical Decision Making     Medications   sodium chloride 0.9 % bolus 1,000 mL (has no administration in time range)        Re-examination:  11/21/22       Time: ***   Patient's condition {:32696}.    Consults:   None    Procedures:   {:18576::none}    MDM:   ***    Plan of Care/Counseling:  {:29914} reviewed today's visit with the {Persons; Family Members:23587} in addition to providing specific details for the plan of care and counseling regarding the diagnosis and prognosis.  Questions are answered at this time and are agreeable with the plan.    Assessment    No diagnosis found.  Plan   {Plan; disposition:32739}.  Patient condition is {condition:18240}    New Medications     New Prescriptions    No medications on file     Electronically signed by Ulanda Edison, APRN - CNP   DD: 11/21/22  **This report was transcribed using voice recognition software. Every effort was made to ensure accuracy; however, inadvertent computerized transcription errors may be present.  END OF ED PROVIDER NOTE

## 2022-11-25 LAB — C.TRACHOMATIS N.GONORRHOEAE DNA
C. trachomatis DNA: NEGATIVE
N. gonorrhoeae DNA: NEGATIVE

## 2023-02-12 MED ORDER — FLUCONAZOLE 150 MG PO TABS
150 | ORAL_TABLET | Freq: Once | ORAL | 1 refills | Status: AC
Start: 2023-02-12 — End: 2023-02-12

## 2023-04-17 ENCOUNTER — Encounter

## 2023-04-18 ENCOUNTER — Encounter: Payer: PRIVATE HEALTH INSURANCE | Attending: Family Medicine | Primary: Family Medicine

## 2023-04-18 MED ORDER — GABAPENTIN 300 MG PO CAPS
300 | ORAL_CAPSULE | Freq: Every evening | ORAL | 0 refills | Status: AC
Start: 2023-04-18 — End: 2023-07-17

## 2023-04-18 MED ORDER — LOSARTAN POTASSIUM 50 MG PO TABS
50 | ORAL_TABLET | ORAL | 5 refills | Status: DC
Start: 2023-04-18 — End: 2024-01-27

## 2023-04-18 MED ORDER — AMLODIPINE BESYLATE 10 MG PO TABS
10 | ORAL_TABLET | ORAL | 5 refills | Status: DC
Start: 2023-04-18 — End: 2024-01-27

## 2023-09-08 ENCOUNTER — Telehealth: Payer: Self-pay | Admitting: Family Medicine

## 2023-09-08 NOTE — Telephone Encounter (Signed)
 Dr Birdie Sons is no longer at this location. Please call the office to schedule a Transfer of Care to either Dr Charlann Lange, Darleen Crocker or Kara Dies, NP. Purvis Sheffield, please schedule a TOC visit for this patient. [facility]   Thank you

## 2023-11-21 ENCOUNTER — Encounter

## 2023-11-21 MED ORDER — GABAPENTIN 300 MG PO CAPS
300 | ORAL_CAPSULE | Freq: Every evening | ORAL | 5 refills | 30.00000 days | Status: DC
Start: 2023-11-21 — End: 2024-02-15

## 2024-01-27 ENCOUNTER — Ambulatory Visit
Admit: 2024-01-27 | Discharge: 2024-01-27 | Payer: PRIVATE HEALTH INSURANCE | Attending: Family Medicine | Primary: Family Medicine

## 2024-01-27 DIAGNOSIS — Z0000 Encounter for general adult medical examination without abnormal findings: Principal | ICD-10-CM

## 2024-01-27 LAB — CBC
Hematocrit: 34.8 % (ref 34.0–48.0)
Hemoglobin: 11.3 g/dL — ABNORMAL LOW (ref 11.5–15.5)
MCH: 28.9 pg (ref 26.0–35.0)
MCHC: 32.5 g/dL (ref 32.0–34.5)
MCV: 89 fL (ref 80.0–99.9)
MPV: 9.4 fL (ref 7.0–12.0)
Platelets: 316 k/uL (ref 130–450)
RBC: 3.91 m/uL (ref 3.50–5.50)
RDW: 14.4 % (ref 11.5–15.0)
WBC: 5.6 k/uL (ref 4.5–11.5)

## 2024-01-27 LAB — COMPREHENSIVE METABOLIC PANEL
ALT: 6 U/L (ref 0–35)
AST: 18 U/L (ref 0–35)
Albumin: 4.2 g/dL (ref 3.5–5.2)
Alkaline Phosphatase: 51 U/L (ref 35–104)
Anion Gap: 14 mmol/L (ref 7–16)
BUN: 11 mg/dL (ref 6–20)
CO2: 23 mmol/L (ref 22–29)
Calcium: 9.2 mg/dL (ref 8.6–10.0)
Chloride: 102 mmol/L (ref 98–107)
Creatinine: 0.8 mg/dL (ref 0.5–1.0)
Est, Glom Filt Rate: >90 mL/min/1.73m2 (ref >60)
Glucose: 86 mg/dL (ref 74–99)
Potassium: 3.6 mmol/L (ref 3.5–5.1)
Sodium: 139 mmol/L (ref 136–145)
Total Bilirubin: 0.5 mg/dL (ref 0.0–1.2)
Total Protein: 8 g/dL (ref 6.4–8.3)

## 2024-01-27 LAB — LIPID PANEL
Cholesterol, Total: 173 mg/dL (ref <200)
HDL: 30 mg/dL — ABNORMAL LOW (ref >40)
LDL Cholesterol: 124 mg/dL — ABNORMAL HIGH (ref <100)
Triglycerides: 96 mg/dL (ref <150)
VLDL: 19 mg/dL

## 2024-01-27 MED ORDER — AMLODIPINE BESY-BENAZEPRIL HCL 10-20 MG PO CAPS
10-20 | ORAL_CAPSULE | Freq: Every day | ORAL | 3 refills | 90.00000 days | Status: AC
Start: 2024-01-27 — End: ?

## 2024-01-27 MED ORDER — APIXABAN 2.5 MG PO TABS
2.5 | ORAL_TABLET | Freq: Two times a day (BID) | ORAL | 2 refills | 30.00000 days | Status: AC
Start: 2024-01-27 — End: ?

## 2024-01-27 NOTE — Progress Notes (Signed)
 [facility]  Department of Family Medicine      Patient:  Robin Wu 43 y.o. female     Date of Service: 01/27/24      Chief complaint:   Chief Complaint   Patient presents with    Annual Exam         History ofPresent Illness   The patient is a 43 y.o. female  presented to the clinic with complaints as above.    Annual physical     Chronic hx of HTN  -needs refills on her medications  -denies any chest pain, SOB, lightheadedness, or dizziness     Hx of PE, on eliquis  for this, 2.5 mg bid, on this indefinitely   -denies any chest pain or SOB     Needs refills on gabapentin      Past Medical History:      Diagnosis Date    History of seizures     off meds for possibly 10 years now, thinks to be related to migraines?    HTN (hypertension)     Neuropathy     Pulmonary embolism (HCC)     Vitamin D  deficiency        PastSurgical History:        Procedure Laterality Date    CESAREAN SECTION      HYSTERECTOMY, TOTAL ABDOMINAL (CERVIX REMOVED)      did not remove ovaries    TUBAL LIGATION         Allergies:    Amoxicillin    Social History:   Social History     Socioeconomic History    Marital status: Single     Spouse name: Not on file    Number of children: Not on file    Years of education: Not on file    Highest education level: Not on file   Occupational History    Not on file   Tobacco Use    Smoking status: Never    Smokeless tobacco: Never   Substance and Sexual Activity    Alcohol use: Not on file    Drug use: Not on file    Sexual activity: Not on file   Other Topics Concern    Not on file   Social History Narrative    Not on file     Social Drivers of Health     Financial Resource Strain: Patient Declined (10/16/2022)    Overall Financial Resource Strain (CARDIA)     Difficulty of Paying Living Expenses: Patient declined   Food Insecurity: No Food Insecurity (01/27/2024)    Hunger Vital Sign     Worried About Running Out of Food in the Last Year: Never true     Ran Out of Food in the Last  Year: Never true   Transportation Needs: No Transportation Needs (01/27/2024)    PRAPARE - Therapist, art (Medical): No     Lack of Transportation (Non-Medical): No   Physical Activity: Not on file   Stress: Not on file   Social Connections: Not on file   Intimate Partner Violence: Not on file   Housing Stability: Low Risk  (01/27/2024)    Housing Stability Vital Sign     Unable to Pay for Housing in the Last Year: No     Number of Times Moved in the Last Year: 0     Homeless in the Last Year: No        Family  History:   No family history on file.    Review of Systems:   Review of Systems - as above     Physical Exam   Vitals: BP 120/80   Pulse 79   Temp 97.3 F (36.3 C) (Infrared)   Resp 14   Ht 1.575 m (5' 2)   Wt 96.2 kg (212 lb)   SpO2 100%   BMI 38.78 kg/m   Physical Exam  Constitutional:       Appearance: She is well-developed.   HENT:      Head: Normocephalic.   Cardiovascular:      Rate and Rhythm: Normal rate and regular rhythm.      Heart sounds: Normal heart sounds. No murmur heard.  Pulmonary:      Effort: Pulmonary effort is normal. No respiratory distress.      Breath sounds: Normal breath sounds. No wheezing.   Abdominal:      General: Bowel sounds are normal.      Palpations: Abdomen is soft.   Musculoskeletal:         General: No tenderness. Normal range of motion.   Skin:     General: Skin is warm and dry.   Neurological:      Mental Status: She is alert and oriented to person, place, and time.   Psychiatric:         Behavior: Behavior normal.             Assessment and Plan       1. Annual physical exam  Doing ok, wants to try and get off of eliquis  thus will refer to hem/onc, needs labs as below  - apixaban  (ELIQUIS ) 2.5 MG TABS tablet; Take 1 tablet by mouth 2 times daily  Dispense: 60 tablet; Refill: 2  - External Referral To Hematology Oncology  - CBC  - Comprehensive Metabolic Panel  - Lipid Panel    2. Hypertension, unspecified type  - CBC  - Comprehensive  Metabolic Panel  - Lipid Panel  - amLODIPine -benazepril  (LOTREL) 10-20 MG per capsule; Take 1 capsule by mouth daily  Dispense: 30 capsule; Refill: 3    3. Hx of pulmonary embolus  - apixaban  (ELIQUIS ) 2.5 MG TABS tablet; Take 1 tablet by mouth 2 times daily  Dispense: 60 tablet; Refill: 2  - External Referral To Hematology Oncology  - CBC  - Comprehensive Metabolic Panel  - Lipid Panel          I am the primary care provider for this patient and provide the patient with continuity of care.     Counseled regarding above diagnosis, including possible risks and complications,  especially if left uncontrolled. Counseled regarding the possible side effects, risks, benefits and alternatives to treatment;patient and/or guardian verbalizes understanding, agrees, feels comfortable with and wishes to proceed with above treatment plan.    Call or go to EDimmediately if symptoms worsen or persist. Advised patient to call with any new medication issues, and, as applicable, read all Rx info from pharmacy to assure aware of all possible risks and side effects of medicationbefore taking.     Patient and/or guardian given opportunity to ask questions/raise concerns.  The patient verbalized comfort and understanding ofinstructions.    I encourage further reading and education about your health conditions.  Information on many health conditions is provided by theAmerican Academy of Family Physicians: https://familydoctor.org/  Please bring any questions to me at your nextvisit.    Return to Office: Return in about  1 year (around 01/26/2025) for annual physical .    Medication List:    Current Outpatient Medications   Medication Sig Dispense Refill    apixaban  (ELIQUIS ) 2.5 MG TABS tablet Take 1 tablet by mouth 2 times daily 60 tablet 2    amLODIPine -benazepril  (LOTREL) 10-20 MG per capsule Take 1 capsule by mouth daily 30 capsule 3    gabapentin  (NEURONTIN ) 300 MG capsule Take 1 capsule by mouth at bedtime. 90 capsule 5    vitamin D   (ERGOCALCIFEROL ) 1.25 MG (50000 UT) CAPS capsule Take 1 capsule by mouth once a week (Patient not taking: Reported on 01/27/2024) 12 capsule 1     No current facility-administered medications for this visit.        Asheton Scheffler R Najae Filsaime, DO       This document may have been prepared at least partially through the use of voice recognition software. Although effort is taken to assure the accuracy ofthis document, it is possible that grammatical, syntax,  or spelling errors may occur.

## 2024-02-15 ENCOUNTER — Encounter

## 2024-02-16 MED ORDER — GABAPENTIN 300 MG PO CAPS
300 | ORAL_CAPSULE | Freq: Every evening | ORAL | 2 refills | Status: AC
Start: 2024-02-16 — End: 2024-11-12

## 2024-02-16 NOTE — Telephone Encounter (Signed)
 Name of Medication(s) Requested:  Requested Prescriptions     Pending Prescriptions Disp Refills    gabapentin  (NEURONTIN ) 300 MG capsule 90 capsule 5     Sig: Take 1 capsule by mouth at bedtime.       Medication is on current medication list Yes    Dosage and directions were verified? Yes    Quantity verified: 90 day supply     Pharmacy Verified?  Yes    Last Appointment:  01/27/2024    Future appts:  Future Appointments   Date Time Provider Department Center   01/28/2025 10:00 AM Brocker, Morene SAUNDERS, DO [facility]        (If no appt send self scheduling link. [email or web address])  Scheduling request sent?     []  Yes  [x]  No    Does patient need updated?  []  Yes  [x]  No
